# Patient Record
Sex: Female | Born: 1955
Health system: Southern US, Community
[De-identification: ages and names within clinical notes are randomized; demographics above are authoritative.]

## PROBLEM LIST (undated history)

## (undated) DIAGNOSIS — K760 Fatty (change of) liver, not elsewhere classified: Secondary | ICD-10-CM

## (undated) DIAGNOSIS — N1330 Unspecified hydronephrosis: Secondary | ICD-10-CM

## (undated) DIAGNOSIS — Z1379 Encounter for other screening for genetic and chromosomal anomalies: Secondary | ICD-10-CM

## (undated) DIAGNOSIS — K219 Gastro-esophageal reflux disease without esophagitis: Secondary | ICD-10-CM

## (undated) DIAGNOSIS — G4730 Sleep apnea, unspecified: Secondary | ICD-10-CM

## (undated) DIAGNOSIS — R011 Cardiac murmur, unspecified: Secondary | ICD-10-CM

## (undated) DIAGNOSIS — H3530 Unspecified macular degeneration: Secondary | ICD-10-CM

## (undated) DIAGNOSIS — D649 Anemia, unspecified: Secondary | ICD-10-CM

## (undated) DIAGNOSIS — F32A Depression, unspecified: Secondary | ICD-10-CM

## (undated) DIAGNOSIS — M1990 Unspecified osteoarthritis, unspecified site: Secondary | ICD-10-CM

## (undated) DIAGNOSIS — C5702 Malignant neoplasm of left fallopian tube: Secondary | ICD-10-CM

## (undated) DIAGNOSIS — Z8489 Family history of other specified conditions: Secondary | ICD-10-CM

## (undated) DIAGNOSIS — R519 Headache, unspecified: Secondary | ICD-10-CM

## (undated) DIAGNOSIS — R51 Headache: Secondary | ICD-10-CM

## (undated) DIAGNOSIS — Z803 Family history of malignant neoplasm of breast: Secondary | ICD-10-CM

## (undated) DIAGNOSIS — E7800 Pure hypercholesterolemia, unspecified: Secondary | ICD-10-CM

## (undated) DIAGNOSIS — F419 Anxiety disorder, unspecified: Secondary | ICD-10-CM

## (undated) DIAGNOSIS — I10 Essential (primary) hypertension: Secondary | ICD-10-CM

## (undated) DIAGNOSIS — J189 Pneumonia, unspecified organism: Secondary | ICD-10-CM

## (undated) DIAGNOSIS — K5790 Diverticulosis of intestine, part unspecified, without perforation or abscess without bleeding: Secondary | ICD-10-CM

## (undated) DIAGNOSIS — F329 Major depressive disorder, single episode, unspecified: Secondary | ICD-10-CM

## (undated) DIAGNOSIS — H3552 Pigmentary retinal dystrophy: Secondary | ICD-10-CM

## (undated) DIAGNOSIS — E039 Hypothyroidism, unspecified: Secondary | ICD-10-CM

## (undated) DIAGNOSIS — Z8619 Personal history of other infectious and parasitic diseases: Secondary | ICD-10-CM

## (undated) HISTORY — DX: Personal history of other infectious and parasitic diseases: Z86.19

## (undated) HISTORY — DX: Essential (primary) hypertension: I10

## (undated) HISTORY — DX: Hypothyroidism, unspecified: E03.9

## (undated) HISTORY — DX: Headache, unspecified: R51.9

## (undated) HISTORY — DX: Unspecified osteoarthritis, unspecified site: M19.90

## (undated) HISTORY — DX: Family history of malignant neoplasm of breast: Z80.3

## (undated) HISTORY — PX: ABDOMINAL HYSTERECTOMY: SUR658

## (undated) HISTORY — DX: Headache: R51

## (undated) HISTORY — DX: Encounter for other screening for genetic and chromosomal anomalies: Z13.79

## (undated) HISTORY — DX: Depression, unspecified: F32.A

## (undated) HISTORY — PX: COLONOSCOPY: SHX174

## (undated) HISTORY — DX: Malignant neoplasm of left fallopian tube: C57.02

## (undated) HISTORY — DX: Gastro-esophageal reflux disease without esophagitis: K21.9

## (undated) HISTORY — DX: Major depressive disorder, single episode, unspecified: F32.9

## (undated) HISTORY — DX: Pure hypercholesterolemia, unspecified: E78.00

---

## 1979-11-19 HISTORY — PX: APPENDECTOMY: SHX54

## 1983-11-19 HISTORY — PX: TUBAL LIGATION: SHX77

## 1998-11-18 HISTORY — PX: CARPAL TUNNEL RELEASE: SHX101

## 1999-03-20 ENCOUNTER — Ambulatory Visit (HOSPITAL_BASED_OUTPATIENT_CLINIC_OR_DEPARTMENT_OTHER): Admission: RE | Admit: 1999-03-20 | Discharge: 1999-03-20 | Payer: Self-pay | Admitting: Orthopedic Surgery

## 2004-11-27 ENCOUNTER — Ambulatory Visit: Payer: Self-pay | Admitting: Unknown Physician Specialty

## 2006-04-17 ENCOUNTER — Ambulatory Visit: Payer: Self-pay | Admitting: Unknown Physician Specialty

## 2006-11-18 HISTORY — PX: EYE SURGERY: SHX253

## 2007-04-20 ENCOUNTER — Ambulatory Visit: Payer: Self-pay | Admitting: Unknown Physician Specialty

## 2007-10-19 ENCOUNTER — Ambulatory Visit: Payer: Self-pay | Admitting: Gastroenterology

## 2008-04-25 ENCOUNTER — Ambulatory Visit: Payer: Self-pay | Admitting: Unknown Physician Specialty

## 2009-06-27 ENCOUNTER — Ambulatory Visit: Payer: Self-pay | Admitting: Unknown Physician Specialty

## 2010-12-18 ENCOUNTER — Ambulatory Visit: Payer: Self-pay | Admitting: Unknown Physician Specialty

## 2011-01-30 ENCOUNTER — Ambulatory Visit: Payer: Self-pay | Admitting: Unknown Physician Specialty

## 2011-03-01 ENCOUNTER — Ambulatory Visit: Payer: Self-pay | Admitting: Unknown Physician Specialty

## 2012-05-28 ENCOUNTER — Other Ambulatory Visit (HOSPITAL_COMMUNITY)
Admission: RE | Admit: 2012-05-28 | Discharge: 2012-05-28 | Disposition: A | Discharge status: Home / Self Care | Payer: BC Managed Care – PPO | Source: Ambulatory Visit | Attending: Internal Medicine | Admitting: Internal Medicine

## 2012-05-28 DIAGNOSIS — Z01419 Encounter for gynecological examination (general) (routine) without abnormal findings: Secondary | ICD-10-CM | POA: Insufficient documentation

## 2012-05-28 DIAGNOSIS — R87810 Cervical high risk human papillomavirus (HPV) DNA test positive: Secondary | ICD-10-CM | POA: Insufficient documentation

## 2012-06-18 ENCOUNTER — Ambulatory Visit: Payer: Self-pay

## 2013-05-24 ENCOUNTER — Ambulatory Visit: Payer: Self-pay

## 2013-05-24 LAB — RAPID STREP-A WITH REFLX: Micro Text Report: NEGATIVE

## 2013-09-07 ENCOUNTER — Ambulatory Visit (INDEPENDENT_AMBULATORY_CARE_PROVIDER_SITE_OTHER): Payer: BC Managed Care – PPO | Admitting: Internal Medicine

## 2013-09-07 ENCOUNTER — Ambulatory Visit (INDEPENDENT_AMBULATORY_CARE_PROVIDER_SITE_OTHER)
Admission: RE | Admit: 2013-09-07 | Discharge: 2013-09-07 | Disposition: A | Discharge status: Home / Self Care | Payer: BC Managed Care – PPO | Source: Ambulatory Visit | Attending: Internal Medicine | Admitting: Internal Medicine

## 2013-09-07 ENCOUNTER — Encounter (INDEPENDENT_AMBULATORY_CARE_PROVIDER_SITE_OTHER): Payer: Self-pay

## 2013-09-07 ENCOUNTER — Encounter: Payer: Self-pay | Admitting: Internal Medicine

## 2013-09-07 ENCOUNTER — Other Ambulatory Visit: Payer: Self-pay | Admitting: Internal Medicine

## 2013-09-07 VITALS — BP 110/70 | HR 76 | Temp 98.1°F | Ht 63.75 in | Wt 181.2 lb

## 2013-09-07 DIAGNOSIS — Z87898 Personal history of other specified conditions: Secondary | ICD-10-CM

## 2013-09-07 DIAGNOSIS — K219 Gastro-esophageal reflux disease without esophagitis: Secondary | ICD-10-CM

## 2013-09-07 DIAGNOSIS — M549 Dorsalgia, unspecified: Secondary | ICD-10-CM

## 2013-09-07 DIAGNOSIS — I10 Essential (primary) hypertension: Secondary | ICD-10-CM | POA: Insufficient documentation

## 2013-09-07 DIAGNOSIS — E039 Hypothyroidism, unspecified: Secondary | ICD-10-CM | POA: Insufficient documentation

## 2013-09-07 DIAGNOSIS — R0602 Shortness of breath: Secondary | ICD-10-CM | POA: Insufficient documentation

## 2013-09-07 DIAGNOSIS — F32A Depression, unspecified: Secondary | ICD-10-CM

## 2013-09-07 DIAGNOSIS — M129 Arthropathy, unspecified: Secondary | ICD-10-CM

## 2013-09-07 DIAGNOSIS — F3289 Other specified depressive episodes: Secondary | ICD-10-CM

## 2013-09-07 DIAGNOSIS — K5790 Diverticulosis of intestine, part unspecified, without perforation or abscess without bleeding: Secondary | ICD-10-CM

## 2013-09-07 DIAGNOSIS — K5730 Diverticulosis of large intestine without perforation or abscess without bleeding: Secondary | ICD-10-CM

## 2013-09-07 DIAGNOSIS — E7800 Pure hypercholesterolemia, unspecified: Secondary | ICD-10-CM | POA: Insufficient documentation

## 2013-09-07 DIAGNOSIS — Z8742 Personal history of other diseases of the female genital tract: Secondary | ICD-10-CM

## 2013-09-07 DIAGNOSIS — R51 Headache: Secondary | ICD-10-CM

## 2013-09-07 DIAGNOSIS — F329 Major depressive disorder, single episode, unspecified: Secondary | ICD-10-CM

## 2013-09-07 DIAGNOSIS — M1990 Unspecified osteoarthritis, unspecified site: Secondary | ICD-10-CM

## 2013-09-07 DIAGNOSIS — H3090 Unspecified chorioretinal inflammation, unspecified eye: Secondary | ICD-10-CM

## 2013-09-07 LAB — COMPREHENSIVE METABOLIC PANEL
ALT: 31 U/L (ref 0–35)
Albumin: 4.1 g/dL (ref 3.5–5.2)
Alkaline Phosphatase: 73 U/L (ref 39–117)
BUN: 15 mg/dL (ref 6–23)
Calcium: 9.3 mg/dL (ref 8.4–10.5)
GFR: 79.81 mL/min (ref >60.00)
Glucose, Bld: 95 mg/dL (ref 70–99)
Potassium: 4.2 mEq/L (ref 3.5–5.1)
Sodium: 138 mEq/L (ref 135–145)

## 2013-09-07 LAB — CBC WITH DIFFERENTIAL/PLATELET
Basophils Absolute: 0.1 10*3/uL (ref 0.0–0.1)
Basophils Relative: 0.8 % (ref 0.0–3.0)
Eosinophils Relative: 1.5 % (ref 0.0–5.0)
HCT: 39.6 % (ref 36.0–46.0)
MCHC: 33.6 g/dL (ref 30.0–36.0)
MCV: 90.4 fl (ref 78.0–100.0)
Monocytes Absolute: 0.6 10*3/uL (ref 0.1–1.0)
Neutrophils Relative %: 52.1 % (ref 43.0–77.0)
RBC: 4.38 Mil/uL (ref 3.87–5.11)
RDW: 14.3 % (ref 11.5–14.6)
WBC: 6.7 10*3/uL (ref 4.5–10.5)

## 2013-09-07 LAB — LIPID PANEL
Cholesterol: 232 mg/dL — ABNORMAL HIGH (ref 0–200)
HDL: 50.6 mg/dL (ref >39.00)
Total CHOL/HDL Ratio: 5
Triglycerides: 95 mg/dL (ref 0.0–149.0)
VLDL: 19 mg/dL (ref 0.0–40.0)

## 2013-09-07 LAB — LDL CHOLESTEROL, DIRECT: Direct LDL: 175.2 mg/dL

## 2013-09-08 ENCOUNTER — Encounter: Payer: Self-pay | Admitting: Internal Medicine

## 2013-09-08 DIAGNOSIS — M1990 Unspecified osteoarthritis, unspecified site: Secondary | ICD-10-CM | POA: Insufficient documentation

## 2013-09-08 DIAGNOSIS — K219 Gastro-esophageal reflux disease without esophagitis: Secondary | ICD-10-CM | POA: Insufficient documentation

## 2013-09-08 DIAGNOSIS — F320 Major depressive disorder, single episode, mild: Secondary | ICD-10-CM | POA: Insufficient documentation

## 2013-09-08 DIAGNOSIS — K5790 Diverticulosis of intestine, part unspecified, without perforation or abscess without bleeding: Secondary | ICD-10-CM | POA: Insufficient documentation

## 2013-09-08 DIAGNOSIS — H3090 Unspecified chorioretinal inflammation, unspecified eye: Secondary | ICD-10-CM | POA: Insufficient documentation

## 2013-09-08 DIAGNOSIS — R87619 Unspecified abnormal cytological findings in specimens from cervix uteri: Secondary | ICD-10-CM | POA: Insufficient documentation

## 2013-09-08 DIAGNOSIS — R51 Headache: Secondary | ICD-10-CM | POA: Insufficient documentation

## 2013-09-08 DIAGNOSIS — F32A Depression, unspecified: Secondary | ICD-10-CM | POA: Insufficient documentation

## 2013-09-08 DIAGNOSIS — R519 Headache, unspecified: Secondary | ICD-10-CM | POA: Insufficient documentation

## 2013-09-08 DIAGNOSIS — F329 Major depressive disorder, single episode, unspecified: Secondary | ICD-10-CM | POA: Insufficient documentation

## 2013-09-08 NOTE — Assessment & Plan Note (Signed)
Experienced some depression in taking care of her mother with alzheimers.  On wellbutrin and doing well.  Follow.   

## 2013-09-08 NOTE — Assessment & Plan Note (Signed)
Previously saw Dr Haskel Khan.  Schedule her for a physical including pap - next visit.

## 2013-09-08 NOTE — Progress Notes (Signed)
Subjective:    Patient ID: Anna [name], female    DOB: August 17, 1956, 57 y.o.   MRN: [id number]  HPI 57 year old female with past history of hypertension, hypercholesterolemia and hypothyroidism who comes in today to follow up on these issues as well as to establish care.  Former pt of Dr Francia Greaves and Dr Elby Showers.  She reports having issues with persistent, worsening low back pain.  Radiates into her hips.  No radiation down her leg.  Present for two years.  Worsening.  Bothers her more when she stands for any length of time.  She also has noticed some increased sob with exertion.  Occasional chest tightness associated with exertion as well.  Will feel weak.  Has to stop and rest.  No nausea or vomiting.  Acid reflux controlled on prilosec.  No abdominal pain or cramping.  Bowels stable.  States received notice due for colonoscopy.  Sees Dr Markham Jordan.  Headaches no longer an issue for her.  She is followed by [facility] for retinitis.  Doing well regarding her depression.  On wellbutrin.     Past Medical History  Diagnosis Date  . Arthritis   . History of chicken pox   . Diverticulitis     H/O  . Frequent headaches     H/O  . GERD (gastroesophageal reflux disease)   . Hypothyroidism   . Hypercholesterolemia   . Hypertension   . Depression     Outpatient Encounter Prescriptions as of 09/07/2013  Medication Sig Dispense Refill  . acyclovir (ZOVIRAX) 200 MG capsule Take by mouth 5 (five) times daily. As needed      . acyclovir cream (ZOVIRAX) 5 % Apply topically daily as needed.      . benazepril-hydrochlorthiazide (LOTENSIN HCT) 20-12.5 MG per tablet Take 1 tablet by mouth daily.      Marland Kitchen buPROPion (WELLBUTRIN XL) 300 MG 24 hr tablet Take 300 mg by mouth daily.      Marland Kitchen EPINEPHrine (EPI-PEN) 0.3 mg/0.3 mL SOAJ injection Inject 0.3 mg into the muscle as needed (for anaphylaxis).      Marland Kitchen levothyroxine (SYNTHROID, LEVOTHROID) 88 MCG tablet Take 88 mcg by mouth daily before  breakfast.      . omeprazole (PRILOSEC) 20 MG capsule Take 20 mg by mouth daily.       No facility-administered encounter medications on file as of 09/07/2013.    Review of Systems Patient denies any headaches now.  No lightheadedness or dizziness.  No sinus or allergy symptoms.  She does report some sob with exertion.  Occasional chest tightness as outlined.  No palpitations.  No increased cough or congestion.  No nausea or vomiting.  Acid reflux controlled with prilosec.  No abdominal pain or cramping.  No bowel change, such as diarrhea, constipation, BRBPR or melana.  No urine change.   Back pan as outlined.  No numbness or tingling.       Objective:   Physical Exam Filed Vitals:   09/07/13 0838  BP: 110/70  Pulse: 76  Temp: 98.1 F (36.7 C)   Blood pressure recheck:  53/67  57 year old female in no acute distress.   HEENT:  Nares- clear.  Oropharynx - without lesions. NECK:  Supple.  Nontender.  No audible bruit.  HEART:  Appears to be regular. LUNGS:  No crackles or wheezing audible.  Respirations even and unlabored.  RADIAL PULSE:  Equal bilaterally.  ABDOMEN:  Soft, nontender.  Bowel sounds present and normal.  No audible abdominal bruit.    EXTREMITIES:  No increased edema present.  DP pulses palpable and equal bilaterally.      MSK:  No pain with straight leg raise.  No pain to palpation - lower back.      Assessment & Plan:  HEALTH MAINTENANCE.  Get her back in soon for a physical.  Get her heart evaluated prior to colonoscopy.  Obtain records for review.  Information given to schedule a mammogram.    I spent 45 minutes with the patient and more than 50% of the time was spent in consultation regarding the above.

## 2013-09-08 NOTE — Assessment & Plan Note (Signed)
Previously had issues with headaches.  Related to her eyes.  Has retinitis.  Followed at [facility].  Stable.  No headaches now.    

## 2013-09-08 NOTE — Assessment & Plan Note (Signed)
On thyroid replacement.  Check tsh.  

## 2013-09-08 NOTE — Assessment & Plan Note (Signed)
Followed at [facility].  Stable.   

## 2013-09-08 NOTE — Assessment & Plan Note (Signed)
States she has a history of arthritis.  With increased and persistent back pain.  Pain worse if she has been standing for a long period of time.  Started two years ago.  No radiation of the pain down her legs.  Some radiation into her hips.  Given persistent and worsening pain, will check L-S spine xray.  Further w/up and treatment pending results.

## 2013-09-08 NOTE — Assessment & Plan Note (Signed)
On no medication.  Low cholesterol diet.  Check lipid panel.

## 2013-09-08 NOTE — Assessment & Plan Note (Signed)
Has noticed sob and dyspnea on exertion.  States if she walks up stairs or extended walking, she will get sob and have to stop and rest.  Some weakness associated.  Occasionally will notice some chest tightness as well.  EKG obtained and revealed SR with flattening of T waves in III.  No acute ischemic changes.  Will obtain stress echo to further evaluate.  Continue risk factor modification.

## 2013-09-08 NOTE — Assessment & Plan Note (Signed)
Blood pressure controlled on current med regimen.  Check metabolic panel.  Follow.   

## 2013-09-08 NOTE — Assessment & Plan Note (Signed)
Has had diverticulitis.  Last flare a few years ago.  Bowels stable.  Obtain record for review.  Check on colonoscopy.

## 2013-09-08 NOTE — Assessment & Plan Note (Signed)
On omeprazole.  Controlled.   

## 2013-09-09 ENCOUNTER — Other Ambulatory Visit: Payer: Self-pay | Admitting: *Deleted

## 2013-09-09 ENCOUNTER — Other Ambulatory Visit: Payer: Self-pay | Admitting: Internal Medicine

## 2013-09-09 DIAGNOSIS — E7800 Pure hypercholesterolemia, unspecified: Secondary | ICD-10-CM

## 2013-09-09 MED ORDER — ATORVASTATIN CALCIUM 10 MG PO TABS: 10.0000 mg | ORAL_TABLET | Freq: Every day | ORAL | Status: DC

## 2013-09-09 NOTE — Progress Notes (Signed)
Order placed for f/u liver panel.  

## 2013-09-23 ENCOUNTER — Other Ambulatory Visit: Payer: Self-pay

## 2013-09-23 ENCOUNTER — Ambulatory Visit: Payer: Self-pay | Admitting: Internal Medicine

## 2013-09-27 ENCOUNTER — Ambulatory Visit (INDEPENDENT_AMBULATORY_CARE_PROVIDER_SITE_OTHER): Payer: BC Managed Care – PPO | Admitting: Internal Medicine

## 2013-09-27 ENCOUNTER — Encounter: Payer: Self-pay | Admitting: Internal Medicine

## 2013-09-27 ENCOUNTER — Telehealth: Payer: Self-pay | Admitting: Emergency Medicine

## 2013-09-27 VITALS — BP 102/70 | HR 97 | Temp 98.2°F | Resp 12 | Wt 181.5 lb

## 2013-09-27 DIAGNOSIS — R21 Rash and other nonspecific skin eruption: Secondary | ICD-10-CM

## 2013-09-27 NOTE — Telephone Encounter (Signed)
See if pt can come in now and we will work her in between pts - will fast track for this problem.

## 2013-09-27 NOTE — Telephone Encounter (Signed)
Appointment made

## 2013-09-27 NOTE — Telephone Encounter (Signed)
Please advise 

## 2013-09-27 NOTE — Telephone Encounter (Signed)
Patient calling in stating she has a rash. It is spreading, everywhere. She is itchy. Raquel nor Dr. Lorin Picket has any openings. Pt would like to be seen. Please call her work number listed.

## 2013-09-27 NOTE — Progress Notes (Signed)
Pre visit review using our clinic review tool, if applicable. No additional management support is needed unless otherwise documented below in the visit note. 

## 2013-09-27 NOTE — Telephone Encounter (Signed)
Pt coming in now & aware that it will be a wait involved. Anna [name], please but this patient on Dr. Lorin Picket schedule to evaluate rash/hives

## 2013-09-28 ENCOUNTER — Encounter: Payer: Self-pay | Admitting: Internal Medicine

## 2013-09-28 DIAGNOSIS — R21 Rash and other nonspecific skin eruption: Secondary | ICD-10-CM | POA: Insufficient documentation

## 2013-09-28 NOTE — Assessment & Plan Note (Signed)
Rash as outlined.  Itching, but no other symptoms.  No fever.  Does not feel bad.  No new exposures.  Unclear etiology.  Zyrtec daily.  Hold prednisone.  Have dermatology evaluate.  Pt comfortable with this plan.  Call if any change or worsening symptoms

## 2013-09-28 NOTE — Progress Notes (Signed)
  Subjective:    Patient ID: Anna [name], female    DOB: November 19, 1955, 57 y.o.   MRN: [id number]  Rash  57 year old female with past history of hypertension, hypercholesterolemia and hypothyroidism who comes in today as a work in with concerns regarding a persistent rash.  States started one week ago.  Started behind her right knee.  Then noticed rash - right buttock.  Has continued to spread.  Now on the back of her neck, lower back, right and left arm and bilateral breasts.  Also located on both feet.  No rash - hands or face.  Itches.  No new contacts or exposures.  No fever.  No sore throat or congestion.  Feels fine.     Past Medical History  Diagnosis Date  . Arthritis   . History of chicken pox   . Diverticulitis     H/O  . Frequent headaches     H/O  . GERD (gastroesophageal reflux disease)   . Hypothyroidism   . Hypercholesterolemia   . Hypertension   . Depression     Outpatient Encounter Prescriptions as of 09/27/2013  Medication Sig  . acyclovir (ZOVIRAX) 200 MG capsule Take by mouth 5 (five) times daily. As needed  . acyclovir cream (ZOVIRAX) 5 % Apply topically daily as needed.  Marland Kitchen atorvastatin (LIPITOR) 10 MG tablet Take 1 tablet (10 mg total) by mouth daily.  . benazepril-hydrochlorthiazide (LOTENSIN HCT) 20-12.5 MG per tablet Take 1 tablet by mouth daily.  Marland Kitchen buPROPion (WELLBUTRIN XL) 300 MG 24 hr tablet Take 300 mg by mouth daily.  Marland Kitchen EPINEPHrine (EPI-PEN) 0.3 mg/0.3 mL SOAJ injection Inject 0.3 mg into the muscle as needed (for anaphylaxis).  Marland Kitchen levothyroxine (SYNTHROID, LEVOTHROID) 88 MCG tablet Take 88 mcg by mouth daily before breakfast.  . omeprazole (PRILOSEC) 20 MG capsule Take 20 mg by mouth daily.    Review of Systems  Skin: Positive for rash.  Patient denies any headaches.   No lightheadedness or dizziness.  No sinus or allergy symptoms.  Breathing stable.  No palpitations.  No increased cough or congestion.  No nausea or vomiting.  No abdominal pain or  cramping.  No bowel change, such as diarrhea.  Feels fine.  Has the rash.  Increased itching.  Took benadryl.       Objective:   Physical Exam  Filed Vitals:   09/27/13 1130  BP: 102/70  Pulse: 97  Temp: 98.2 F (36.8 C)  Resp: 67   57 year old female in no acute distress.   HEENT:  Nares- clear.  Oropharynx - without lesions. NECK:  Supple.  Nontender.   HEART:  Appears to be regular. LUNGS:  No crackles or wheezing audible.  Respirations even and unlabored.  RADIAL PULSE:  Equal bilaterally.  SKIN:  Erythematous based lesions over the right and left arm, bilateral feel, lower back and lower abdomen and neck.  Some appear to be vesicular.  Non tender.        Assessment & Plan:

## 2013-09-30 ENCOUNTER — Other Ambulatory Visit (INDEPENDENT_AMBULATORY_CARE_PROVIDER_SITE_OTHER): Payer: BC Managed Care – PPO

## 2013-09-30 DIAGNOSIS — R0602 Shortness of breath: Secondary | ICD-10-CM

## 2013-09-30 DIAGNOSIS — R079 Chest pain, unspecified: Secondary | ICD-10-CM

## 2013-10-04 ENCOUNTER — Encounter: Payer: Self-pay | Admitting: Internal Medicine

## 2013-10-06 NOTE — Telephone Encounter (Signed)
Mailed unread message to pt  

## 2013-10-08 ENCOUNTER — Encounter: Payer: Self-pay | Admitting: Internal Medicine

## 2013-10-11 ENCOUNTER — Telehealth: Payer: Self-pay | Admitting: Internal Medicine

## 2013-10-11 ENCOUNTER — Other Ambulatory Visit: Payer: Self-pay | Admitting: *Deleted

## 2013-10-11 MED ORDER — ATORVASTATIN CALCIUM 10 MG PO TABS: 10.0000 mg | ORAL_TABLET | Freq: Every day | ORAL | Status: DC

## 2013-10-11 NOTE — Telephone Encounter (Signed)
Sent Rx electronically

## 2013-10-11 NOTE — Telephone Encounter (Signed)
The patient is completely out of this medication   atorvastatin (LIPITOR) 10 MG tablet

## 2013-10-21 ENCOUNTER — Other Ambulatory Visit (INDEPENDENT_AMBULATORY_CARE_PROVIDER_SITE_OTHER): Payer: BC Managed Care – PPO

## 2013-10-21 DIAGNOSIS — E7800 Pure hypercholesterolemia, unspecified: Secondary | ICD-10-CM

## 2013-10-21 LAB — HEPATIC FUNCTION PANEL
AST: 21 U/L (ref 0–37)
Alkaline Phosphatase: 72 U/L (ref 39–117)
Bilirubin, Direct: 0 mg/dL (ref 0.0–0.3)
Total Bilirubin: 0.6 mg/dL (ref 0.3–1.2)

## 2013-10-22 ENCOUNTER — Encounter: Payer: Self-pay | Admitting: Internal Medicine

## 2013-10-25 NOTE — Telephone Encounter (Signed)
Mailed unread message to pt  

## 2013-11-22 ENCOUNTER — Encounter: Payer: BC Managed Care – PPO | Admitting: Internal Medicine

## 2013-11-22 ENCOUNTER — Other Ambulatory Visit: Payer: Self-pay | Admitting: *Deleted

## 2013-11-22 MED ORDER — ATORVASTATIN CALCIUM 10 MG PO TABS: 10.0000 mg | ORAL_TABLET | Freq: Every day | ORAL | Status: DC

## 2013-12-02 ENCOUNTER — Telehealth: Payer: Self-pay | Admitting: Internal Medicine

## 2013-12-02 NOTE — Telephone Encounter (Signed)
Pt appt 2/24 being rs due to provider schedule change.  [facility] 90 day supplies needed.  Pt needs scripts:  Omeprazole, benazepril, wellbutrin XL, levothyroxine.  Only has about a week left of her meds.

## 2013-12-03 ENCOUNTER — Other Ambulatory Visit: Payer: Self-pay | Admitting: *Deleted

## 2013-12-03 MED ORDER — BUPROPION HCL ER (XL) 300 MG PO TB24: 300.0000 mg | ORAL_TABLET | Freq: Every day | ORAL | Status: DC

## 2013-12-03 MED ORDER — OMEPRAZOLE 20 MG PO CPDR: 20.0000 mg | DELAYED_RELEASE_CAPSULE | Freq: Every day | ORAL | Status: DC

## 2013-12-03 MED ORDER — LEVOTHYROXINE SODIUM 88 MCG PO TABS
88.0000 ug | ORAL_TABLET | Freq: Every day | ORAL | Status: DC
Start: 2013-12-03 — End: 2014-02-14

## 2013-12-03 MED ORDER — BENAZEPRIL-HYDROCHLOROTHIAZIDE 20-12.5 MG PO TABS: 1.0000 | ORAL_TABLET | Freq: Every day | ORAL | Status: DC

## 2013-12-03 NOTE — Telephone Encounter (Signed)
Medications sent to [facility]

## 2013-12-03 NOTE — Telephone Encounter (Signed)
[facility] 90 day supplies needed. Pt needs scripts: Omeprazole, benazepril, wellbutrin XL, levothyroxine. Only has about a week left of her meds.

## 2013-12-31 ENCOUNTER — Encounter: Payer: Self-pay | Admitting: Internal Medicine

## 2013-12-31 ENCOUNTER — Ambulatory Visit (INDEPENDENT_AMBULATORY_CARE_PROVIDER_SITE_OTHER): Payer: BC Managed Care – PPO | Admitting: Internal Medicine

## 2013-12-31 VITALS — BP 120/80 | HR 90 | Temp 98.5°F | Ht 63.75 in | Wt 186.5 lb

## 2013-12-31 DIAGNOSIS — M542 Cervicalgia: Secondary | ICD-10-CM

## 2013-12-31 DIAGNOSIS — G4733 Obstructive sleep apnea (adult) (pediatric): Secondary | ICD-10-CM

## 2013-12-31 DIAGNOSIS — J329 Chronic sinusitis, unspecified: Secondary | ICD-10-CM

## 2013-12-31 DIAGNOSIS — R0683 Snoring: Secondary | ICD-10-CM

## 2013-12-31 DIAGNOSIS — R5383 Other fatigue: Secondary | ICD-10-CM

## 2013-12-31 DIAGNOSIS — I10 Essential (primary) hypertension: Secondary | ICD-10-CM

## 2013-12-31 DIAGNOSIS — R0989 Other specified symptoms and signs involving the circulatory and respiratory systems: Secondary | ICD-10-CM

## 2013-12-31 DIAGNOSIS — R5381 Other malaise: Secondary | ICD-10-CM

## 2013-12-31 DIAGNOSIS — R0609 Other forms of dyspnea: Secondary | ICD-10-CM

## 2013-12-31 MED ORDER — FLUTICASONE PROPIONATE 50 MCG/ACT NA SUSP: 2.0000 | Freq: Every day | NASAL | Status: DC

## 2013-12-31 MED ORDER — CEFDINIR 300 MG PO CAPS: 300.0000 mg | ORAL_CAPSULE | Freq: Two times a day (BID) | ORAL | Status: DC

## 2013-12-31 MED ORDER — ETODOLAC 400 MG PO TABS: 400.0000 mg | ORAL_TABLET | Freq: Two times a day (BID) | ORAL | Status: DC | PRN

## 2013-12-31 NOTE — Progress Notes (Signed)
Pre-visit discussion using our clinic review tool. No additional management support is needed unless otherwise documented below in the visit note.  

## 2013-12-31 NOTE — Patient Instructions (Signed)
Saline nasal spray - flush nose at least 2-3x/day.  Flonase nasal spray - 2 sprays each nostril one time per day- do this in the evening.   

## 2014-01-04 ENCOUNTER — Encounter: Payer: Self-pay | Admitting: Internal Medicine

## 2014-01-04 DIAGNOSIS — M542 Cervicalgia: Secondary | ICD-10-CM | POA: Insufficient documentation

## 2014-01-04 DIAGNOSIS — G4733 Obstructive sleep apnea (adult) (pediatric): Secondary | ICD-10-CM | POA: Insufficient documentation

## 2014-01-04 NOTE — Assessment & Plan Note (Signed)
Blood pressure controlled on current med regimen.  Follow metabolic panel.

## 2014-01-04 NOTE — Assessment & Plan Note (Signed)
Treat with omnicef as directed.  flonase and saline nasal spray as directed.  Robitussin as directed.  Follow.  Notify me if symptoms worsen or do not resolve.

## 2014-01-04 NOTE — Assessment & Plan Note (Signed)
Symptoms appear to be consistent with sleep apnea.  Reports witness apneic episodes and increased snoring.  Also with daytime somnolence and fatigue.  Does not feel rested when she wakes.  Schedule a split night sleep study.

## 2014-01-04 NOTE — Assessment & Plan Note (Addendum)
Neck pain and exam as outlined.  Increased pain with rotation of her head.  Will hold on a muscle relaxor until can get the sleep apnea issues sorted through.  Gentle stretches.  Lodine as directed.  Discussed possible side effects of medication.

## 2014-01-04 NOTE — Progress Notes (Signed)
Subjective:    Patient ID: Anna [name], female    DOB: 01-31-1956, 58 y.o.   MRN: [id number]  URI  Associated symptoms include a plugged ear sensation.  Ear Fullness   58 year old female with past history of hypertension, hypercholesterolemia and hypothyroidism who comes in today as a work in with concerns regarding increased sinus pressure and neck pain.  States posterior neck pain started four days ago.  She thought she had "slept wrong".  She has continued to have neck pain.  Increased pain with looking from left to right.  Describes a pulling sensation.  She also reports increased sinus pressure and increased drainage.  Irritated throat.  Some right ear fullness.  No chest congestion.  No vomiting or diarrhea.  Started taking Dayquil.  Also taking Ibuprofen.     Past Medical History  Diagnosis Date  . Arthritis   . History of chicken pox   . Diverticulitis     H/O  . Frequent headaches     H/O  . GERD (gastroesophageal reflux disease)   . Hypothyroidism   . Hypercholesterolemia   . Hypertension   . Depression     Outpatient Encounter Prescriptions as of 12/31/2013  Medication Sig  . acyclovir (ZOVIRAX) 200 MG capsule Take by mouth 5 (five) times daily. As needed  . acyclovir cream (ZOVIRAX) 5 % Apply topically daily as needed.  Marland Kitchen atorvastatin (LIPITOR) 10 MG tablet Take 1 tablet (10 mg total) by mouth daily.  . benazepril-hydrochlorthiazide (LOTENSIN HCT) 20-12.5 MG per tablet Take 1 tablet by mouth daily.  Marland Kitchen buPROPion (WELLBUTRIN XL) 300 MG 24 hr tablet Take 1 tablet (300 mg total) by mouth daily.  Marland Kitchen EPINEPHrine (EPI-PEN) 0.3 mg/0.3 mL SOAJ injection Inject 0.3 mg into the muscle as needed (for anaphylaxis).  Marland Kitchen levothyroxine (SYNTHROID, LEVOTHROID) 88 MCG tablet Take 1 tablet (88 mcg total) by mouth daily before breakfast.  . omeprazole (PRILOSEC) 20 MG capsule Take 1 capsule (20 mg total) by mouth daily.  . cefdinir (OMNICEF) 300 MG capsule Take 1 capsule (300 mg total) by  mouth 2 (two) times daily.  Marland Kitchen etodolac (LODINE) 400 MG tablet Take 1 tablet (400 mg total) by mouth 2 (two) times daily as needed.  . fluticasone (FLONASE) 50 MCG/ACT nasal spray Place 2 sprays into both nostrils daily.    Review of Systems Patient denies any headaches now. Does report sinus pressure.  Minimal light headedness with these symptoms started. No significant dizziness.  Increased drainage and irritated throat.  Right ear fullness.  Symptoms feel c/w her previous sinus infections.  No increased cough or congestion.   No nausea or vomiting.  Acid reflux controlled with prilosec.  Increased neck pain/discomfort with rotation of her head.  Describes a pulling sensation.  Discussed the possiblity of sleep apnea.  Does report that her husband has noticed spells of not breathing when sleeping.  Reports snoring. Increased daytime fatigue.  Wakes up - not feeling rested.  Daytime somnolence.        Objective:   Physical Exam  Filed Vitals:   12/31/13 1606  BP: 120/80  Pulse: 90  Temp: 98.5 F (57.9 C)   58 year old female in no acute distress.   HEENT:  Nares- slightly erythematous turbinates.  Oropharynx - without lesions.  TMs - no erythema.  Question of some fluid behind right ear.  Minimal tenderness over the maxillary sinus.   NECK:  Increased pulling sensation and discomfort with rotation of her head to  right and left.  Increased tenderness to palpation over the right lateral neck.   HEART:  Appears to be regular. LUNGS:  No crackles or wheezing audible.  Respirations even and unlabored.      Assessment & Plan:  HEALTH MAINTENANCE.  Get her back in soon for a physical.  Mammogram 09/23/13 - Birads I.    I spent 25 minutes with the patient and more than 50% of the time was spent in consultation regarding the above.

## 2014-01-10 ENCOUNTER — Encounter: Payer: BC Managed Care – PPO | Admitting: Internal Medicine

## 2014-01-28 ENCOUNTER — Encounter: Payer: Self-pay | Admitting: Internal Medicine

## 2014-01-28 ENCOUNTER — Ambulatory Visit (INDEPENDENT_AMBULATORY_CARE_PROVIDER_SITE_OTHER): Payer: BC Managed Care – PPO | Admitting: Internal Medicine

## 2014-01-28 ENCOUNTER — Other Ambulatory Visit (HOSPITAL_COMMUNITY)
Admission: RE | Admit: 2014-01-28 | Discharge: 2014-01-28 | Disposition: A | Discharge status: Home / Self Care | Payer: BC Managed Care – PPO | Source: Ambulatory Visit | Attending: Internal Medicine | Admitting: Internal Medicine

## 2014-01-28 VITALS — BP 110/80 | HR 89 | Temp 98.3°F | Ht 64.25 in | Wt 186.5 lb

## 2014-01-28 DIAGNOSIS — Z01419 Encounter for gynecological examination (general) (routine) without abnormal findings: Secondary | ICD-10-CM | POA: Insufficient documentation

## 2014-01-28 DIAGNOSIS — M129 Arthropathy, unspecified: Secondary | ICD-10-CM

## 2014-01-28 DIAGNOSIS — Z1151 Encounter for screening for human papillomavirus (HPV): Secondary | ICD-10-CM | POA: Insufficient documentation

## 2014-01-28 DIAGNOSIS — E7800 Pure hypercholesterolemia, unspecified: Secondary | ICD-10-CM

## 2014-01-28 DIAGNOSIS — M1990 Unspecified osteoarthritis, unspecified site: Secondary | ICD-10-CM

## 2014-01-28 DIAGNOSIS — H3090 Unspecified chorioretinal inflammation, unspecified eye: Secondary | ICD-10-CM

## 2014-01-28 DIAGNOSIS — K5730 Diverticulosis of large intestine without perforation or abscess without bleeding: Secondary | ICD-10-CM

## 2014-01-28 DIAGNOSIS — G4733 Obstructive sleep apnea (adult) (pediatric): Secondary | ICD-10-CM

## 2014-01-28 DIAGNOSIS — Z1211 Encounter for screening for malignant neoplasm of colon: Secondary | ICD-10-CM

## 2014-01-28 DIAGNOSIS — F329 Major depressive disorder, single episode, unspecified: Secondary | ICD-10-CM

## 2014-01-28 DIAGNOSIS — R0602 Shortness of breath: Secondary | ICD-10-CM

## 2014-01-28 DIAGNOSIS — Z124 Encounter for screening for malignant neoplasm of cervix: Secondary | ICD-10-CM

## 2014-01-28 DIAGNOSIS — K219 Gastro-esophageal reflux disease without esophagitis: Secondary | ICD-10-CM

## 2014-01-28 DIAGNOSIS — E039 Hypothyroidism, unspecified: Secondary | ICD-10-CM

## 2014-01-28 DIAGNOSIS — F3289 Other specified depressive episodes: Secondary | ICD-10-CM

## 2014-01-28 DIAGNOSIS — F32A Depression, unspecified: Secondary | ICD-10-CM

## 2014-01-28 DIAGNOSIS — K5790 Diverticulosis of intestine, part unspecified, without perforation or abscess without bleeding: Secondary | ICD-10-CM

## 2014-01-28 DIAGNOSIS — R51 Headache: Secondary | ICD-10-CM

## 2014-01-28 DIAGNOSIS — I10 Essential (primary) hypertension: Secondary | ICD-10-CM

## 2014-01-28 MED ORDER — EPINEPHRINE 0.3 MG/0.3ML IJ SOAJ: 0.3000 mg | INTRAMUSCULAR | Status: DC | PRN

## 2014-01-28 NOTE — Progress Notes (Signed)
Pre-visit discussion using our clinic review tool. No additional management support is needed unless otherwise documented below in the visit note.  

## 2014-01-28 NOTE — Progress Notes (Signed)
Subjective:    Patient ID: Anna [name], female    DOB: 1956-02-23, 58 y.o.   MRN: [id number]  HPI 58 year old female with past history of hypertension, hypercholesterolemia and hypothyroidism who comes in today to follow up on these issues as well as for a complete physical exam.  She had reports\ed having issues with persistent, worsening low back pain.  Radiates into her hips.  No radiation down her leg.  Present for two years.  Better now.  No nausea or vomiting.  Acid reflux controlled on prilosec.  No abdominal pain or cramping.  Bowels stable.  States received notice due for colonoscopy.  Sees Dr Tiffany Kocher.  Headaches no longer an issue for her.  She is followed by [facility] for retinitis.  Doing well regarding her depression.  On wellbutrin.     Past Medical History  Diagnosis Date  . Arthritis   . History of chicken pox   . Diverticulitis     H/O  . Frequent headaches     H/O  . GERD (gastroesophageal reflux disease)   . Hypothyroidism   . Hypercholesterolemia   . Hypertension   . Depression     Outpatient Encounter Prescriptions as of 01/28/2014  Medication Sig  . acyclovir (ZOVIRAX) 200 MG capsule Take by mouth 5 (five) times daily. As needed  . acyclovir cream (ZOVIRAX) 5 % Apply topically daily as needed.  Marland Kitchen atorvastatin (LIPITOR) 10 MG tablet Take 1 tablet (10 mg total) by mouth daily.  . benazepril-hydrochlorthiazide (LOTENSIN HCT) 20-12.5 MG per tablet Take 1 tablet by mouth daily.  Marland Kitchen buPROPion (WELLBUTRIN XL) 300 MG 24 hr tablet Take 1 tablet (300 mg total) by mouth daily.  Marland Kitchen EPINEPHrine (EPI-PEN) 0.3 mg/0.3 mL SOAJ injection Inject 0.3 mg into the muscle as needed (for anaphylaxis).  Marland Kitchen etodolac (LODINE) 400 MG tablet Take 1 tablet (400 mg total) by mouth 2 (two) times daily as needed.  . fluticasone (FLONASE) 50 MCG/ACT nasal spray Place 2 sprays into both nostrils daily.  Marland Kitchen levothyroxine (SYNTHROID, LEVOTHROID) 88 MCG tablet Take 1 tablet (88 mcg total) by  mouth daily before breakfast.  . omeprazole (PRILOSEC) 20 MG capsule Take 1 capsule (20 mg total) by mouth daily.  . [DISCONTINUED] cefdinir (OMNICEF) 300 MG capsule Take 1 capsule (300 mg total) by mouth 2 (two) times daily.    Review of Systems Patient denies any headaches now.  No lightheadedness or dizziness.  No sinus or allergy symptoms.  No sob reported today.  No chest pain or tightness.  No palpitations.  No increased cough or congestion.  No nausea or vomiting.  Acid reflux controlled with prilosec.  No abdominal pain or cramping.  No bowel change, such as diarrhea, constipation, BRBPR or melana.  No urine change.   Back pain not reported as a significant issue on this visit.       Objective:   Physical Exam  Filed Vitals:   01/28/14 1506  BP: 110/80  Pulse: 89  Temp: 98.3 F (36.8 C)   Blood pressure recheck:  79/38  58 year old female in no acute distress.   HEENT:  Nares- clear.  Oropharynx - without lesions. NECK:  Supple.  Nontender.  No audible bruit.  HEART:  Appears to be regular. LUNGS:  No crackles or wheezing audible.  Respirations even and unlabored.  RADIAL PULSE:  Equal bilaterally.    BREASTS:  No nipple discharge or nipple retraction present.  Could not appreciate any distinct nodules  or axillary adenopathy.  ABDOMEN:  Soft, nontender.  Bowel sounds present and normal.  No audible abdominal bruit.  GU:  Normal external genitalia.  Vaginal vault without lesions.  Cervix identified.  Pap performed. Could not appreciate any adnexal masses or tenderness.   RECTAL:  Heme negative.   EXTREMITIES:  No increased edema present.  DP pulses palpable and equal bilaterally.          Assessment & Plan:  HEALTH MAINTENANCE.  Physical today.  Pap today.  Mammogram 09/23/13 - Birads I.  Refer to Dr Tiffany Kocher for colonoscopy.   I spent 25 minutes with the patient and more than 50% of the time was spent in consultation regarding the above.

## 2014-01-30 ENCOUNTER — Encounter: Payer: Self-pay | Admitting: Internal Medicine

## 2014-01-30 NOTE — Assessment & Plan Note (Signed)
Previously had issues with headaches.  Related to her eyes.  Has retinitis.  Followed at [facility].  Stable.  No headaches now.    

## 2014-01-30 NOTE — Assessment & Plan Note (Signed)
Experienced some depression in taking care of her mother with alzheimers.  On wellbutrin and doing well.  Follow.   

## 2014-01-30 NOTE — Assessment & Plan Note (Signed)
Symptoms appear to be consistent with sleep apnea.  Reports witness apneic episodes and increased snoring.  Also with daytime somnolence and fatigue.  Does not feel rested when she wakes.  She has been scheduled for a split night sleep study.  Had to reschedule.  Plans to get this soon.

## 2014-01-30 NOTE — Assessment & Plan Note (Signed)
On no medication.  Low cholesterol diet.  Follow lipid panel.   

## 2014-01-30 NOTE — Assessment & Plan Note (Signed)
Followed at [facility].  Stable.   

## 2014-01-30 NOTE — Assessment & Plan Note (Signed)
Previous back pain.  Xray with arthritis changes.  Physical therapy.  Back better.    

## 2014-01-30 NOTE — Assessment & Plan Note (Signed)
Resolved now.  Recent stress echo negative for ischemia.  Follow.

## 2014-01-30 NOTE — Assessment & Plan Note (Signed)
Has had diverticulitis.  Last flare a few years ago.  Bowels stable.  Due colonoscopy.  Referral back to Dr Tonna Boehringer.

## 2014-01-30 NOTE — Assessment & Plan Note (Signed)
On omeprazole.  Controlled.   

## 2014-01-30 NOTE — Assessment & Plan Note (Signed)
Previously saw Dr Vernie Ammons.  Repeat pap today.

## 2014-01-30 NOTE — Assessment & Plan Note (Signed)
On thyroid replacement.  Follow tsh.  

## 2014-01-30 NOTE — Assessment & Plan Note (Signed)
Blood pressure controlled on current med regimen.  Check metabolic panel.  Follow.   

## 2014-02-01 ENCOUNTER — Encounter: Payer: Self-pay | Admitting: Internal Medicine

## 2014-02-03 NOTE — Telephone Encounter (Signed)
Mailed unread message to pt  

## 2014-02-14 ENCOUNTER — Other Ambulatory Visit: Payer: BC Managed Care – PPO

## 2014-02-14 ENCOUNTER — Other Ambulatory Visit: Payer: Self-pay | Admitting: Internal Medicine

## 2014-03-10 ENCOUNTER — Ambulatory Visit: Payer: Self-pay | Admitting: Internal Medicine

## 2014-03-25 ENCOUNTER — Ambulatory Visit: Payer: Self-pay | Admitting: Gastroenterology

## 2014-03-25 LAB — HM COLONOSCOPY: HM COLON: NORMAL

## 2014-03-31 ENCOUNTER — Encounter: Payer: Self-pay | Admitting: Internal Medicine

## 2014-04-12 ENCOUNTER — Encounter: Payer: Self-pay | Admitting: Internal Medicine

## 2014-04-18 ENCOUNTER — Other Ambulatory Visit: Payer: Self-pay | Admitting: Internal Medicine

## 2014-04-29 ENCOUNTER — Other Ambulatory Visit: Payer: Self-pay | Admitting: Internal Medicine

## 2014-05-16 ENCOUNTER — Other Ambulatory Visit: Payer: Self-pay

## 2014-06-20 ENCOUNTER — Encounter: Payer: Self-pay | Admitting: Internal Medicine

## 2014-06-20 DIAGNOSIS — Z83719 Family history of colon polyps, unspecified: Secondary | ICD-10-CM

## 2014-06-20 DIAGNOSIS — Z8371 Family history of colonic polyps: Secondary | ICD-10-CM | POA: Insufficient documentation

## 2014-07-12 ENCOUNTER — Other Ambulatory Visit: Payer: Self-pay | Admitting: Internal Medicine

## 2014-08-04 ENCOUNTER — Ambulatory Visit (INDEPENDENT_AMBULATORY_CARE_PROVIDER_SITE_OTHER): Payer: BC Managed Care – PPO | Admitting: Internal Medicine

## 2014-08-04 ENCOUNTER — Encounter: Payer: Self-pay | Admitting: Internal Medicine

## 2014-08-04 VITALS — BP 110/70 | HR 80 | Temp 98.3°F | Ht 64.25 in | Wt 186.5 lb

## 2014-08-04 DIAGNOSIS — E7800 Pure hypercholesterolemia, unspecified: Secondary | ICD-10-CM

## 2014-08-04 DIAGNOSIS — Z8371 Family history of colonic polyps: Secondary | ICD-10-CM

## 2014-08-04 DIAGNOSIS — M1990 Unspecified osteoarthritis, unspecified site: Secondary | ICD-10-CM

## 2014-08-04 DIAGNOSIS — Z23 Encounter for immunization: Secondary | ICD-10-CM

## 2014-08-04 DIAGNOSIS — B351 Tinea unguium: Secondary | ICD-10-CM

## 2014-08-04 DIAGNOSIS — E039 Hypothyroidism, unspecified: Secondary | ICD-10-CM

## 2014-08-04 DIAGNOSIS — F32A Depression, unspecified: Secondary | ICD-10-CM

## 2014-08-04 DIAGNOSIS — Z1239 Encounter for other screening for malignant neoplasm of breast: Secondary | ICD-10-CM

## 2014-08-04 DIAGNOSIS — F3289 Other specified depressive episodes: Secondary | ICD-10-CM

## 2014-08-04 DIAGNOSIS — M129 Arthropathy, unspecified: Secondary | ICD-10-CM

## 2014-08-04 DIAGNOSIS — G4733 Obstructive sleep apnea (adult) (pediatric): Secondary | ICD-10-CM

## 2014-08-04 DIAGNOSIS — Z83719 Family history of colon polyps, unspecified: Secondary | ICD-10-CM

## 2014-08-04 DIAGNOSIS — K219 Gastro-esophageal reflux disease without esophagitis: Secondary | ICD-10-CM

## 2014-08-04 DIAGNOSIS — I10 Essential (primary) hypertension: Secondary | ICD-10-CM

## 2014-08-04 DIAGNOSIS — H3090 Unspecified chorioretinal inflammation, unspecified eye: Secondary | ICD-10-CM

## 2014-08-04 DIAGNOSIS — K5730 Diverticulosis of large intestine without perforation or abscess without bleeding: Secondary | ICD-10-CM

## 2014-08-04 DIAGNOSIS — F329 Major depressive disorder, single episode, unspecified: Secondary | ICD-10-CM

## 2014-08-04 DIAGNOSIS — R51 Headache: Secondary | ICD-10-CM

## 2014-08-04 LAB — BASIC METABOLIC PANEL
BUN: 12 mg/dL (ref 6–23)
CO2: 26 meq/L (ref 19–32)
Calcium: 8.7 mg/dL (ref 8.4–10.5)
Chloride: 103 mEq/L (ref 96–112)
Creatinine, Ser: 0.9 mg/dL (ref 0.4–1.2)
GFR: 65.9 mL/min (ref >60.00)
Glucose, Bld: 93 mg/dL (ref 70–99)
POTASSIUM: 4.1 meq/L (ref 3.5–5.1)
SODIUM: 139 meq/L (ref 135–145)

## 2014-08-04 LAB — CBC WITH DIFFERENTIAL/PLATELET
Basophils Absolute: 0 10*3/uL (ref 0.0–0.1)
Basophils Relative: 0.4 % (ref 0.0–3.0)
EOS ABS: 0.1 10*3/uL (ref 0.0–0.7)
Eosinophils Relative: 2.2 % (ref 0.0–5.0)
HEMATOCRIT: 38.3 % (ref 36.0–46.0)
Hemoglobin: 12.7 g/dL (ref 12.0–15.0)
Lymphocytes Relative: 41.1 % (ref 12.0–46.0)
Lymphs Abs: 2.3 10*3/uL (ref 0.7–4.0)
MCHC: 33.2 g/dL (ref 30.0–36.0)
MCV: 92.2 fl (ref 78.0–100.0)
Monocytes Absolute: 0.6 10*3/uL (ref 0.1–1.0)
Monocytes Relative: 10.1 % (ref 3.0–12.0)
NEUTROS PCT: 46.2 % (ref 43.0–77.0)
Neutro Abs: 2.5 10*3/uL (ref 1.4–7.7)
PLATELETS: 279 10*3/uL (ref 150.0–400.0)
RBC: 4.15 Mil/uL (ref 3.87–5.11)
RDW: 14.6 % (ref 11.5–15.5)
WBC: 5.5 10*3/uL (ref 4.0–10.5)

## 2014-08-04 LAB — LIPID PANEL
CHOLESTEROL: 195 mg/dL (ref 0–200)
HDL: 40.5 mg/dL (ref >39.00)
LDL CALC: 130 mg/dL — AB (ref 0–99)
NonHDL: 154.5
Total CHOL/HDL Ratio: 5
Triglycerides: 124 mg/dL (ref 0.0–149.0)
VLDL: 24.8 mg/dL (ref 0.0–40.0)

## 2014-08-04 LAB — HEPATIC FUNCTION PANEL
ALT: 35 U/L (ref 0–35)
AST: 26 U/L (ref 0–37)
Albumin: 3.8 g/dL (ref 3.5–5.2)
Alkaline Phosphatase: 69 U/L (ref 39–117)
BILIRUBIN DIRECT: 0.1 mg/dL (ref 0.0–0.3)
BILIRUBIN TOTAL: 0.7 mg/dL (ref 0.2–1.2)
Total Protein: 7 g/dL (ref 6.0–8.3)

## 2014-08-04 LAB — TSH: TSH: 3.98 u[IU]/mL (ref 0.35–4.50)

## 2014-08-04 NOTE — Progress Notes (Signed)
Subjective:    Patient ID: Anna [name], female    DOB: 12/19/1955, 58 y.o.   MRN: [id number]  HPI 58 year old female with past history of hypertension, hypercholesterolemia and hypothyroidism who comes in today for a scheduled follow up.  States overall she is doing relatively well.   No nausea or vomiting.  Acid reflux controlled on prilosec.  No abdominal pain or cramping.  Bowels stable.  Headaches no longer an issue for her.  She is followed by [facility] for retinitis.  Doing well regarding her depression.  On wellbutrin.  She initially noticed cracking of her left great toenail - eight months ago.  Has progressed.  Fungus involves only the left great toenail.  She has been off lipitor for 6 months.  Wanted to see what she could do with diet and exercise.     Past Medical History  Diagnosis Date  . Arthritis   . History of chicken pox   . Diverticulitis     H/O  . Frequent headaches     H/O  . GERD (gastroesophageal reflux disease)   . Hypothyroidism   . Hypercholesterolemia   . Hypertension   . Depression     Outpatient Encounter Prescriptions as of 08/04/2014  Medication Sig  . acyclovir (ZOVIRAX) 200 MG capsule Take by mouth 5 (five) times daily. As needed  . acyclovir cream (ZOVIRAX) 5 % Apply topically daily as needed.  Marland Kitchen atorvastatin (LIPITOR) 10 MG tablet TAKE 1 TABLET DAILY  . benazepril-hydrochlorthiazide (LOTENSIN HCT) 20-12.5 MG per tablet TAKE 1 TABLET DAILY  . buPROPion (WELLBUTRIN XL) 300 MG 24 hr tablet TAKE 1 TABLET DAILY  . EPINEPHrine (EPI-PEN) 0.3 mg/0.3 mL SOAJ injection Inject 0.3 mLs (0.3 mg total) into the muscle as needed (for anaphylaxis).  Marland Kitchen etodolac (LODINE) 400 MG tablet Take 1 tablet (400 mg total) by mouth 2 (two) times daily as needed.  . fluticasone (FLONASE) 50 MCG/ACT nasal spray Place 2 sprays into both nostrils daily.  Marland Kitchen levothyroxine (SYNTHROID, LEVOTHROID) 88 MCG tablet TAKE 1 TABLET DAILY BEFORE BREAKFAST  . omeprazole (PRILOSEC)  20 MG capsule TAKE 1 CAPSULE DAILY    Review of Systems Patient denies any headaches now.  No lightheadedness or dizziness.  No sinus or allergy symptoms.  No sob.  No chest pain or tightness.  No palpitations.  No increased cough or congestion. No nausea or vomiting.  Acid reflux controlled with prilosec.  No abdominal pain or cramping.  No bowel change, such as diarrhea, constipation, BRBPR or melana.  No urine change.   Toe nail fungus as outlined.       Objective:   Physical Exam  Filed Vitals:   08/04/14 0809  BP: 110/70  Pulse: 80  Temp: 98.3 F (36.8 C)   Blood pressure recheck:  71/39  58 year old female in no acute distress.   HEENT:  Nares- clear.  Oropharynx - without lesions. NECK:  Supple.  Nontender.  No audible bruit.  HEART:  Appears to be regular. LUNGS:  No crackles or wheezing audible.  Respirations even and unlabored.  RADIAL PULSE:  Equal bilaterally.  ABDOMEN:  Soft, nontender.  Bowel sounds present and normal.  No audible abdominal bruit.  EXTREMITIES:  No increased edema present.  DP pulses palpable and equal bilaterally.   Left great toe nail - fungus (isoloated to this toenail only).          Assessment & Plan:  HEALTH MAINTENANCE.  Physical 01/28/14.  Pap 01/28/14 negative with negative HPV.   Mammogram 09/23/13 - Birads I.  Referred to Dr Tiffany Kocher for colonoscopy.   Colonoscopy 03/25/14 normal.  Recommended f/u colonoscopy in five years.    I spent 25 minutes with the patient and more than 50% of the time was spent in consultation regarding the above.

## 2014-08-04 NOTE — Progress Notes (Signed)
Pre visit review using our clinic review tool, if applicable. No additional management support is needed unless otherwise documented below in the visit note. 

## 2014-08-05 ENCOUNTER — Encounter: Payer: Self-pay | Admitting: Internal Medicine

## 2014-08-07 ENCOUNTER — Encounter: Payer: Self-pay | Admitting: Internal Medicine

## 2014-08-07 DIAGNOSIS — B351 Tinea unguium: Secondary | ICD-10-CM | POA: Insufficient documentation

## 2014-08-07 MED ORDER — TERBINAFINE HCL 250 MG PO TABS: 250.0000 mg | ORAL_TABLET | Freq: Every day | ORAL | Status: DC

## 2014-08-07 NOTE — Assessment & Plan Note (Signed)
On thyroid replacement.  Follow tsh.  

## 2014-08-07 NOTE — Assessment & Plan Note (Signed)
Previous back pain.  Xray with arthritis changes.  Physical therapy.  Back better.

## 2014-08-07 NOTE — Assessment & Plan Note (Signed)
Followed at [facility].  Stable.

## 2014-08-07 NOTE — Assessment & Plan Note (Signed)
PAP 01/28/14 negative with negative HPV.

## 2014-08-07 NOTE — Assessment & Plan Note (Signed)
Previously had issues with headaches.  Related to her eyes.  Has retinitis.  Followed at [facility].  Stable.  No headaches now.

## 2014-08-07 NOTE — Assessment & Plan Note (Signed)
Toenail fungus as outlined.  Localized to one toe.  Discussed treatment options.  She wants to take lamisil.  She understands risk and possible side effects of the medication.  Off lipitor and will remain off.  Check liver panel today.  Will need f/u liver panel in one month.

## 2014-08-07 NOTE — Assessment & Plan Note (Signed)
Blood pressure controlled on current med regimen.  Check metabolic panel.  Follow.

## 2014-08-07 NOTE — Assessment & Plan Note (Signed)
On no medication.  Low cholesterol diet.  Follow lipid panel.

## 2014-08-07 NOTE — Assessment & Plan Note (Signed)
Using CPAP.  Follow.  

## 2014-08-07 NOTE — Assessment & Plan Note (Signed)
On omeprazole.  Controlled.   

## 2014-08-07 NOTE — Assessment & Plan Note (Signed)
Has had diverticulitis.  Last flare a few years ago.  Bowels stable.  Colonoscopy 03/25/14 normal.  Recommended f/u colonoscopy in five years.

## 2014-08-07 NOTE — Assessment & Plan Note (Signed)
Experienced some depression in taking care of her mother with alzheimers.  On wellbutrin and doing well.  Follow.

## 2014-08-07 NOTE — Assessment & Plan Note (Signed)
Colonoscopy 03/25/14 - normal.  Recommend f/u colonoscopy 5 years.

## 2014-09-07 ENCOUNTER — Other Ambulatory Visit: Payer: BC Managed Care – PPO

## 2014-09-07 ENCOUNTER — Telehealth: Payer: Self-pay | Admitting: *Deleted

## 2014-09-07 DIAGNOSIS — Z79899 Other long term (current) drug therapy: Secondary | ICD-10-CM

## 2014-09-07 NOTE — Telephone Encounter (Signed)
Order placed for liver panel.  

## 2014-09-07 NOTE — Telephone Encounter (Signed)
Pt is coming tomorrow what labs and dx?  

## 2014-09-08 ENCOUNTER — Encounter: Payer: Self-pay | Admitting: Internal Medicine

## 2014-09-08 ENCOUNTER — Other Ambulatory Visit (INDEPENDENT_AMBULATORY_CARE_PROVIDER_SITE_OTHER): Payer: BC Managed Care – PPO

## 2014-09-08 ENCOUNTER — Telehealth: Payer: Self-pay | Admitting: Internal Medicine

## 2014-09-08 DIAGNOSIS — Z79899 Other long term (current) drug therapy: Secondary | ICD-10-CM

## 2014-09-08 LAB — HEPATIC FUNCTION PANEL
ALBUMIN: 3.4 g/dL — AB (ref 3.5–5.2)
ALK PHOS: 66 U/L (ref 39–117)
ALT: 40 U/L — AB (ref 0–35)
AST: 44 U/L — AB (ref 0–37)
Bilirubin, Direct: 0.1 mg/dL (ref 0.0–0.3)
TOTAL PROTEIN: 7.4 g/dL (ref 6.0–8.3)
Total Bilirubin: 0.7 mg/dL (ref 0.2–1.2)

## 2014-09-08 NOTE — Telephone Encounter (Signed)
Pt notified of lab results via my chart.  Needs non fasting lab within one week.  Please schedule and contact her with a lab appt date and time.   Thanks.

## 2014-09-09 NOTE — Telephone Encounter (Signed)
Called patient & left voicemail on cell & asked pt to check mychart messages or give [place] a call back. Also advised patient to stop Lamisil since Liver fxn was increased.

## 2014-09-13 ENCOUNTER — Other Ambulatory Visit: Payer: Self-pay | Admitting: Internal Medicine

## 2014-09-15 ENCOUNTER — Telehealth: Payer: Self-pay | Admitting: *Deleted

## 2014-09-15 DIAGNOSIS — R945 Abnormal results of liver function studies: Secondary | ICD-10-CM

## 2014-09-15 DIAGNOSIS — R7989 Other specified abnormal findings of blood chemistry: Secondary | ICD-10-CM

## 2014-09-15 NOTE — Telephone Encounter (Signed)
Order placed for f/u liver panel.  

## 2014-09-15 NOTE — Telephone Encounter (Signed)
Pt is coming in tomorrow what labs and dx?  

## 2014-09-16 ENCOUNTER — Other Ambulatory Visit (INDEPENDENT_AMBULATORY_CARE_PROVIDER_SITE_OTHER): Payer: BC Managed Care – PPO

## 2014-09-16 DIAGNOSIS — R945 Abnormal results of liver function studies: Secondary | ICD-10-CM

## 2014-09-16 DIAGNOSIS — R7989 Other specified abnormal findings of blood chemistry: Secondary | ICD-10-CM

## 2014-09-16 LAB — HEPATIC FUNCTION PANEL
ALBUMIN: 3.4 g/dL — AB (ref 3.5–5.2)
ALK PHOS: 68 U/L (ref 39–117)
ALT: 33 U/L (ref 0–35)
AST: 26 U/L (ref 0–37)
BILIRUBIN DIRECT: 0 mg/dL (ref 0.0–0.3)
TOTAL PROTEIN: 7.3 g/dL (ref 6.0–8.3)
Total Bilirubin: 0.7 mg/dL (ref 0.2–1.2)

## 2014-09-19 ENCOUNTER — Encounter: Payer: Self-pay | Admitting: Internal Medicine

## 2014-09-24 ENCOUNTER — Other Ambulatory Visit: Payer: Self-pay | Admitting: Internal Medicine

## 2014-09-27 ENCOUNTER — Encounter: Payer: Self-pay | Admitting: Internal Medicine

## 2014-09-27 ENCOUNTER — Ambulatory Visit: Payer: Self-pay | Admitting: Internal Medicine

## 2014-09-29 ENCOUNTER — Ambulatory Visit: Payer: Self-pay | Admitting: Internal Medicine

## 2014-09-29 LAB — HM MAMMOGRAPHY: HM Mammogram: NEGATIVE

## 2014-09-30 ENCOUNTER — Encounter: Payer: Self-pay | Admitting: Internal Medicine

## 2015-02-10 ENCOUNTER — Encounter: Payer: BC Managed Care – PPO | Admitting: Internal Medicine

## 2015-02-21 ENCOUNTER — Encounter: Payer: Self-pay | Admitting: Internal Medicine

## 2015-03-16 ENCOUNTER — Other Ambulatory Visit: Payer: Self-pay | Admitting: Internal Medicine

## 2015-05-04 ENCOUNTER — Ambulatory Visit (INDEPENDENT_AMBULATORY_CARE_PROVIDER_SITE_OTHER): Payer: 59 | Admitting: Internal Medicine

## 2015-05-04 ENCOUNTER — Other Ambulatory Visit (HOSPITAL_COMMUNITY)
Admission: RE | Admit: 2015-05-04 | Discharge: 2015-05-04 | Disposition: A | Discharge status: Home / Self Care | Payer: 59 | Source: Ambulatory Visit | Attending: Internal Medicine | Admitting: Internal Medicine

## 2015-05-04 ENCOUNTER — Encounter: Payer: Self-pay | Admitting: Internal Medicine

## 2015-05-04 VITALS — BP 110/80 | HR 74 | Temp 98.1°F | Ht 64.25 in | Wt 189.2 lb

## 2015-05-04 DIAGNOSIS — E7800 Pure hypercholesterolemia, unspecified: Secondary | ICD-10-CM

## 2015-05-04 DIAGNOSIS — K219 Gastro-esophageal reflux disease without esophagitis: Secondary | ICD-10-CM

## 2015-05-04 DIAGNOSIS — Z8371 Family history of colonic polyps: Secondary | ICD-10-CM

## 2015-05-04 DIAGNOSIS — Z01419 Encounter for gynecological examination (general) (routine) without abnormal findings: Secondary | ICD-10-CM | POA: Insufficient documentation

## 2015-05-04 DIAGNOSIS — F329 Major depressive disorder, single episode, unspecified: Secondary | ICD-10-CM

## 2015-05-04 DIAGNOSIS — Z0000 Encounter for general adult medical examination without abnormal findings: Secondary | ICD-10-CM

## 2015-05-04 DIAGNOSIS — F32A Depression, unspecified: Secondary | ICD-10-CM

## 2015-05-04 DIAGNOSIS — I10 Essential (primary) hypertension: Secondary | ICD-10-CM

## 2015-05-04 DIAGNOSIS — E039 Hypothyroidism, unspecified: Secondary | ICD-10-CM | POA: Diagnosis not present

## 2015-05-04 DIAGNOSIS — Z1151 Encounter for screening for human papillomavirus (HPV): Secondary | ICD-10-CM | POA: Insufficient documentation

## 2015-05-04 DIAGNOSIS — G4733 Obstructive sleep apnea (adult) (pediatric): Secondary | ICD-10-CM | POA: Diagnosis not present

## 2015-05-04 DIAGNOSIS — Z83719 Family history of colon polyps, unspecified: Secondary | ICD-10-CM

## 2015-05-04 DIAGNOSIS — R87619 Unspecified abnormal cytological findings in specimens from cervix uteri: Secondary | ICD-10-CM

## 2015-05-04 LAB — BASIC METABOLIC PANEL
BUN: 14 mg/dL (ref 6–23)
CHLORIDE: 102 meq/L (ref 96–112)
CO2: 28 mEq/L (ref 19–32)
Calcium: 9.6 mg/dL (ref 8.4–10.5)
Creatinine, Ser: 0.87 mg/dL (ref 0.40–1.20)
GFR: 70.99 mL/min (ref >60.00)
Glucose, Bld: 90 mg/dL (ref 70–99)
POTASSIUM: 4.4 meq/L (ref 3.5–5.1)
SODIUM: 137 meq/L (ref 135–145)

## 2015-05-04 LAB — CBC WITH DIFFERENTIAL/PLATELET
BASOS PCT: 0.8 % (ref 0.0–3.0)
Basophils Absolute: 0.1 10*3/uL (ref 0.0–0.1)
Eosinophils Absolute: 0.2 10*3/uL (ref 0.0–0.7)
Eosinophils Relative: 2.4 % (ref 0.0–5.0)
HCT: 40.9 % (ref 36.0–46.0)
HEMOGLOBIN: 13.5 g/dL (ref 12.0–15.0)
Lymphocytes Relative: 42.4 % (ref 12.0–46.0)
Lymphs Abs: 2.8 10*3/uL (ref 0.7–4.0)
MCHC: 33.1 g/dL (ref 30.0–36.0)
MCV: 91.8 fl (ref 78.0–100.0)
MONO ABS: 0.7 10*3/uL (ref 0.1–1.0)
Monocytes Relative: 10.3 % (ref 3.0–12.0)
NEUTROS ABS: 2.9 10*3/uL (ref 1.4–7.7)
Neutrophils Relative %: 44.1 % (ref 43.0–77.0)
Platelets: 304 10*3/uL (ref 150.0–400.0)
RBC: 4.45 Mil/uL (ref 3.87–5.11)
RDW: 15 % (ref 11.5–15.5)
WBC: 6.6 10*3/uL (ref 4.0–10.5)

## 2015-05-04 LAB — LIPID PANEL
CHOLESTEROL: 198 mg/dL (ref 0–200)
HDL: 46.9 mg/dL (ref >39.00)
LDL CALC: 126 mg/dL — AB (ref 0–99)
NonHDL: 151.1
Total CHOL/HDL Ratio: 4
Triglycerides: 128 mg/dL (ref 0.0–149.0)
VLDL: 25.6 mg/dL (ref 0.0–40.0)

## 2015-05-04 LAB — HEPATIC FUNCTION PANEL
ALT: 34 U/L (ref 0–35)
AST: 25 U/L (ref 0–37)
Albumin: 4.2 g/dL (ref 3.5–5.2)
Alkaline Phosphatase: 68 U/L (ref 39–117)
Bilirubin, Direct: 0.1 mg/dL (ref 0.0–0.3)
Total Bilirubin: 0.5 mg/dL (ref 0.2–1.2)
Total Protein: 7.2 g/dL (ref 6.0–8.3)

## 2015-05-04 LAB — TSH: TSH: 3.22 u[IU]/mL (ref 0.35–4.50)

## 2015-05-04 MED ORDER — BUPROPION HCL ER (XL) 300 MG PO TB24: 300.0000 mg | ORAL_TABLET | Freq: Every day | ORAL | Status: DC

## 2015-05-04 MED ORDER — LEVOTHYROXINE SODIUM 88 MCG PO TABS: 88.0000 ug | ORAL_TABLET | Freq: Every day | ORAL | Status: DC

## 2015-05-04 MED ORDER — BENAZEPRIL-HYDROCHLOROTHIAZIDE 20-12.5 MG PO TABS: 1.0000 | ORAL_TABLET | Freq: Every day | ORAL | Status: DC

## 2015-05-04 MED ORDER — OMEPRAZOLE 20 MG PO CPDR: 20.0000 mg | DELAYED_RELEASE_CAPSULE | Freq: Every day | ORAL | Status: DC

## 2015-05-04 MED ORDER — EPINEPHRINE 0.3 MG/0.3ML IJ SOAJ: 0.3000 mg | INTRAMUSCULAR | Status: DC | PRN

## 2015-05-04 NOTE — Progress Notes (Signed)
Patient ID: Anna [name], female   DOB: Dec 05, 1955, 59 y.o.   MRN: [id number]   Subjective:    Patient ID: Anna [name], female    DOB: 1956/09/03, 59 y.o.   MRN: [id number]  HPI  Patient here to follow up on her current medical issues as well as for a complete physical exam.   She is under increased stress.  Her husband was recently diagnosed with colon cancer.  We discussed this at length today.  Has good support.  Tries to stay active.  No cardiac symptoms with increased activity or exertion.  Breathing stable.  Bowels stable.  Wants pap smear.  States paternal grandmother and great grandmother - cervical cancer.     Past Medical History  Diagnosis Date  . Arthritis   . History of chicken pox   . Diverticulitis     H/O  . Frequent headaches     H/O  . GERD (gastroesophageal reflux disease)   . Hypothyroidism   . Hypercholesterolemia   . Hypertension   . Depression     Current Outpatient Prescriptions on File Prior to Visit  Medication Sig Dispense Refill  . acyclovir (ZOVIRAX) 200 MG capsule Take by mouth 5 (five) times daily. As needed    . acyclovir cream (ZOVIRAX) 5 % Apply topically daily as needed.     No current facility-administered medications on file prior to visit.    Review of Systems  Constitutional: Negative for appetite change and unexpected weight change.  HENT: Negative for congestion and sinus pressure.   Eyes: Negative for pain and visual disturbance.  Respiratory: Negative for cough, chest tightness and shortness of breath.   Cardiovascular: Negative for chest pain, palpitations and leg swelling.  Gastrointestinal: Negative for nausea, vomiting, abdominal pain and diarrhea.  Genitourinary: Negative for frequency and difficulty urinating.  Musculoskeletal: Negative for back pain and joint swelling.  Skin: Negative for color change and rash.  Neurological: Negative for dizziness, light-headedness and headaches.  Hematological: Negative  for adenopathy. Does not bruise/bleed easily.  Psychiatric/Behavioral: Negative for dysphoric mood and agitation.       Objective:     Pulse recheck:  64  Physical Exam  Constitutional: She is oriented to person, place, and time. She appears well-developed and well-nourished.  HENT:  Nose: Nose normal.  Mouth/Throat: Oropharynx is clear and moist.  Eyes: Right eye exhibits no discharge. Left eye exhibits no discharge. No scleral icterus.  Neck: Neck supple. No thyromegaly present.  Cardiovascular: Normal rate and regular rhythm.   Pulmonary/Chest: Breath sounds normal. No accessory muscle usage. No tachypnea. No respiratory distress. She has no decreased breath sounds. She has no wheezes. She has no rhonchi. Right breast exhibits no inverted nipple, no mass, no nipple discharge and no tenderness (no axillary adenopathy). Left breast exhibits no inverted nipple, no mass, no nipple discharge and no tenderness (no axilarry adenopathy).  Abdominal: Soft. Bowel sounds are normal. There is no tenderness.  Genitourinary:  Normal external genitalia.  Vaginal vault without lesions.  Cervix identified.  Pap smear performed.  Could not appreciate any adnexal masses or tenderness.    Musculoskeletal: She exhibits no edema or tenderness.  Lymphadenopathy:    She has no cervical adenopathy.  Neurological: She is alert and oriented to person, place, and time.  Skin: Skin is warm. No rash noted.  Psychiatric: She has a normal mood and affect. Her behavior is normal.    BP 110/80 mmHg  Pulse 74  Temp(Src) 98.1 F (36.7  C) (Oral)  Ht 5' 4.25" (1.632 m)  Wt 189 lb 4 oz (85.843 kg)  BMI 32.23 kg/m2  SpO2 96% Wt Readings from Last 3 Encounters:  05/04/15 189 lb 4 oz (85.843 kg)  08/04/14 186 lb 8 oz (84.596 kg)  01/28/14 186 lb 8 oz (84.596 kg)     Lab Results  Component Value Date   WBC 6.6 05/04/2015   HGB 13.5 05/04/2015   HCT 40.9 05/04/2015   PLT 304.0 05/04/2015   GLUCOSE 90  05/04/2015   CHOL 198 05/04/2015   TRIG 128.0 05/04/2015   HDL 46.90 05/04/2015   LDLDIRECT 175.2 09/07/2013   LDLCALC 126* 05/04/2015   ALT 34 05/04/2015   AST 25 05/04/2015   NA 137 05/04/2015   K 4.4 05/04/2015   CL 102 05/04/2015   CREATININE 0.87 05/04/2015   BUN 14 05/04/2015   CO2 28 05/04/2015   TSH 3.22 05/04/2015       Assessment & Plan:   Problem List Items Addressed This Visit    Abnormal Pap smear of cervix    Has a history of abnormal pap smear.  PAP 01/28/14 negative with negative HPV.  Wanted f/u pap today.  Pap today.       Depression    On wellbutrin.  Increased stress as outlined.  Discussed at length with her today.  Has good support.  Will notify me if she feels she needs anything more.  Follow.       Relevant Medications   buPROPion (WELLBUTRIN XL) 300 MG 24 hr tablet   Essential hypertension, benign - Primary    Blood pressure doing well.  Same medication regimen.  Follow pressures.  Follow metabolic panel.        Relevant Medications   benazepril-hydrochlorthiazide (LOTENSIN HCT) 20-12.5 MG per tablet   EPINEPHrine 0.3 mg/0.3 mL IJ SOAJ injection   Other Relevant Orders   Basic metabolic panel (Completed)   Cytology - PAP (Completed)   Family history of colonic polyps    Colonoscopy 03/25/14 - normal.  Recommended f/u colonoscopy in 5 years.        GERD (gastroesophageal reflux disease)    On omeprazole.       Relevant Medications   omeprazole (PRILOSEC) 20 MG capsule   Health care maintenance    Physical today 05/04/15.  PAP today.  Mammogram 09/29/14 - Birads I. Colonoscopy 03/25/14 - normal.  Recommended f/u colonoscopy in five years.        Hypercholesterolemia   Relevant Medications   benazepril-hydrochlorthiazide (LOTENSIN HCT) 20-12.5 MG per tablet   EPINEPHrine 0.3 mg/0.3 mL IJ SOAJ injection   Other Relevant Orders   Lipid panel (Completed)   Hepatic function panel (Completed)   Cytology - PAP (Completed)   Hypothyroidism     On thyroid replacement.  Follow tsh.        Relevant Medications   levothyroxine (SYNTHROID, LEVOTHROID) 88 MCG tablet   Other Relevant Orders   CBC with Differential/Platelet (Completed)   TSH (Completed)   Cytology - PAP (Completed)   Obstructive sleep apnea    Continue CPAP.         I spent 25 minutes with the patient and more than 50% of the time was spent in consultation regarding the above.     Einar Pheasant, MD

## 2015-05-04 NOTE — Progress Notes (Signed)
Pre visit review using our clinic review tool, if applicable. No additional management support is needed unless otherwise documented below in the visit note. 

## 2015-05-05 ENCOUNTER — Encounter: Payer: Self-pay | Admitting: Internal Medicine

## 2015-05-05 LAB — CYTOLOGY - PAP

## 2015-05-08 ENCOUNTER — Encounter: Payer: Self-pay | Admitting: Internal Medicine

## 2015-05-08 DIAGNOSIS — Z0000 Encounter for general adult medical examination without abnormal findings: Secondary | ICD-10-CM | POA: Insufficient documentation

## 2015-05-08 NOTE — Assessment & Plan Note (Signed)
Blood pressure doing well.  Same medication regimen.  Follow pressures.  Follow metabolic panel.   

## 2015-05-08 NOTE — Assessment & Plan Note (Signed)
On wellbutrin.  Increased stress as outlined.  Discussed at length with her today.  Has good support.  Will notify me if she feels she needs anything more.  Follow.

## 2015-05-08 NOTE — Assessment & Plan Note (Signed)
On omeprazole.  

## 2015-05-08 NOTE — Assessment & Plan Note (Signed)
Has a history of abnormal pap smear.  PAP 01/28/14 negative with negative HPV.  Wanted f/u pap today.  Pap today.

## 2015-05-08 NOTE — Assessment & Plan Note (Signed)
Physical today 05/04/15.  PAP today.  Mammogram 09/29/14 - Birads I. Colonoscopy 03/25/14 - normal.  Recommended f/u colonoscopy in five years.

## 2015-05-08 NOTE — Assessment & Plan Note (Signed)
Colonoscopy 03/25/14 - normal.  Recommended f/u colonoscopy in 5 years.

## 2015-05-08 NOTE — Assessment & Plan Note (Signed)
On thyroid replacement.  Follow tsh.  

## 2015-05-08 NOTE — Assessment & Plan Note (Signed)
Continue CPAP.  

## 2015-05-09 ENCOUNTER — Encounter: Payer: Self-pay | Admitting: Internal Medicine

## 2015-05-09 ENCOUNTER — Ambulatory Visit (INDEPENDENT_AMBULATORY_CARE_PROVIDER_SITE_OTHER): Payer: 59 | Admitting: Internal Medicine

## 2015-05-09 VITALS — BP 122/76 | HR 86 | Temp 97.7°F | Wt 188.8 lb

## 2015-05-09 DIAGNOSIS — R350 Frequency of micturition: Secondary | ICD-10-CM

## 2015-05-09 DIAGNOSIS — R3915 Urgency of urination: Secondary | ICD-10-CM

## 2015-05-09 LAB — POCT URINALYSIS DIPSTICK
Bilirubin, UA: NEGATIVE
Blood, UA: NEGATIVE
Glucose, UA: NEGATIVE
Ketones, UA: NEGATIVE
LEUKOCYTES UA: NEGATIVE
NITRITE UA: NEGATIVE
PROTEIN UA: NEGATIVE
Spec Grav, UA: 1.02
Urobilinogen, UA: NEGATIVE
pH, UA: 6

## 2015-05-09 MED ORDER — NITROFURANTOIN MONOHYD MACRO 100 MG PO CAPS: 100.0000 mg | ORAL_CAPSULE | Freq: Two times a day (BID) | ORAL | Status: DC

## 2015-05-09 NOTE — Addendum Note (Signed)
Addended by: Lurlean Nanny on: 05/09/2015 02:37 PM   Modules accepted: Orders

## 2015-05-09 NOTE — Patient Instructions (Signed)

## 2015-05-09 NOTE — Progress Notes (Signed)
HPI  Pt presents to the clinic today with c/o urinary frequency, urgency, burning sensation and low back pain. This started 2 days ago. She has also noticed some blood in her urine. She denies fever, chills or nausea. She has not tried anything OTC. She denies any vaginal complaints.   Review of Systems  Past Medical History  Diagnosis Date  . Arthritis   . History of chicken pox   . Diverticulitis     H/O  . Frequent headaches     H/O  . GERD (gastroesophageal reflux disease)   . Hypothyroidism   . Hypercholesterolemia   . Hypertension   . Depression     Family History  Problem Relation Age of Onset  . Hyperlipidemia Mother   . Hypertension Mother   . Alzheimer's disease Mother   . Arthritis Father   . Hyperlipidemia Father   . Heart disease Father     first MI age 37  . Hypertension Father   . Hyperlipidemia Brother   . Heart disease Brother     heart disease dx at a youg age  . Hypertension Brother   . Alzheimer's disease Maternal Aunt   . Hyperlipidemia Maternal Uncle   . Heart disease Maternal Uncle   . Sudden death Maternal Uncle 42    massive heart attack in doctors office  . Arthritis Maternal Grandmother   . Hyperlipidemia Maternal Grandmother   . Stroke Maternal Grandmother   . Hypertension Maternal Grandmother   . Alzheimer's disease Maternal Grandmother   . Alcohol abuse Maternal Grandfather   . Hypertension Maternal Grandfather   . Diabetes Maternal Grandfather   . Cancer Paternal Grandmother     Ovarian  . Hypertension Paternal Grandmother   . Alcohol abuse Paternal Grandfather   . Hyperlipidemia Paternal Grandfather   . Heart disease Paternal Grandfather   . Hypertension Paternal Grandfather     History   Social History  . Marital Status: Married    Spouse Name: N/A  . Number of Children: 2  . Years of Education: N/A   Occupational History  . Not on file.   Social History Main Topics  . Smoking status: Never Smoker   . Smokeless  tobacco: Never Used  . Alcohol Use: No  . Drug Use: No  . Sexual Activity: Not on file   Other Topics Concern  . Not on file   Social History Narrative    No Known Allergies  Constitutional: Denies fever, malaise, fatigue, headache or abrupt weight changes.   GU: Pt reports urgency, frequency and pain with urination. Denies odor or discharge. Skin: Denies redness, rashes, lesions or ulcercations.   No other specific complaints in a complete review of systems (except as listed in HPI above).    Objective:   Physical Exam  BP 122/76 mmHg  Pulse 86  Temp(Src) 97.7 F (36.5 C) (Oral)  Wt 188 lb 12.8 oz (85.639 kg)  SpO2 98%  Wt Readings from Last 3 Encounters:  05/09/15 188 lb 12.8 oz (85.639 kg)  05/04/15 189 lb 4 oz (85.843 kg)  08/04/14 186 lb 8 oz (84.596 kg)    General: Appears her stated age, well developed, well nourished in NAD. Cardiovascular: Normal rate and rhythm. S1,S2 noted.  No murmur, rubs or gallops noted.  Pulmonary/Chest: Normal effort and positive vesicular breath sounds. No respiratory distress. No wheezes, rales or ronchi noted.  Abdomen: Soft and nontender. Normal bowel sounds, no bruits noted. No distention or masses noted.  No CVA  tenderness.      Assessment & Plan:   Urgency, Frequency, Burning Sensation:  Urinalysis: normal Will send urine culture (if negative will stop antibiotic) Given her symptoms will treat with eRx sent if for Macrobid 100 mg BID x 5 days OK to take AZO OTC Drink plenty of fluids  RTC as needed or if symptoms persist.

## 2015-05-09 NOTE — Progress Notes (Signed)
Pre visit review using our clinic review tool, if applicable. No additional management support is needed unless otherwise documented below in the visit note. 

## 2015-05-11 LAB — URINE CULTURE

## 2015-05-15 NOTE — Telephone Encounter (Signed)
Unread mychart message mailed to patient 

## 2015-08-20 ENCOUNTER — Other Ambulatory Visit: Payer: Self-pay | Admitting: Internal Medicine

## 2015-08-21 NOTE — Telephone Encounter (Signed)
Refilled wellbutrin #90 with no refills.  She needs a f/u appt in the next 1-2 months.

## 2015-08-21 NOTE — Telephone Encounter (Signed)
Received a refill request for Wellbutrin. Last refilled 05/04/15 for #90 with 0 refills. Last office visit 05/04/15. Okay to refill this medication?

## 2015-08-23 ENCOUNTER — Encounter: Payer: Self-pay | Admitting: *Deleted

## 2015-08-31 NOTE — Telephone Encounter (Signed)
Unread mychart message mailed to patient 

## 2015-09-18 ENCOUNTER — Encounter: Payer: Self-pay | Admitting: Internal Medicine

## 2015-09-18 ENCOUNTER — Telehealth: Payer: Self-pay | Admitting: Internal Medicine

## 2015-09-18 NOTE — Telephone Encounter (Signed)
Dr. Scott please advise.

## 2015-09-18 NOTE — Telephone Encounter (Signed)
Patient unavailable until the 9th.   Dealing with a patient going through chemo, Please advise?

## 2015-09-18 NOTE — Telephone Encounter (Signed)
Pt called about needing a medication refill appt. Medication is acyclovir cream (ZOVIRAX) 5 % No avail appt to sch, Let me know where to sch after speaking to Dr Nicki Reaper. Thank you!

## 2015-09-18 NOTE — Telephone Encounter (Signed)
I can see her on 09/20/15 at 10:00 if needs an appt.  Please block 30 minutes.  Thanks

## 2015-09-18 NOTE — Telephone Encounter (Signed)
What is going on and what does she need refill for?  May just be able to refill until can get in, unless having acute issues that needs evaluation.

## 2015-09-19 MED ORDER — ACYCLOVIR 400 MG PO TABS: ORAL_TABLET | ORAL | Status: DC

## 2015-09-19 MED ORDER — ACYCLOVIR 5 % EX CREA: TOPICAL_CREAM | CUTANEOUS | Status: DC

## 2015-09-19 NOTE — Telephone Encounter (Signed)
Left a message for her to return my call for clarification.

## 2015-09-19 NOTE — Telephone Encounter (Signed)
rx sent in for acyclovir cream (15 g) with no refills.  Also sen in rx for acyclovir 400mg  tid for 5 days prn flare #30 with one refill.  Pt notified via my chart.

## 2015-09-20 ENCOUNTER — Telehealth: Payer: Self-pay

## 2015-09-20 ENCOUNTER — Other Ambulatory Visit: Payer: Self-pay | Admitting: *Deleted

## 2015-09-20 NOTE — Telephone Encounter (Signed)
[facility] with [facility], patient has review case # [id number].  Form to be faxed over to the office.

## 2015-09-22 MED ORDER — ACYCLOVIR 5 % EX CREA: TOPICAL_CREAM | CUTANEOUS | Status: DC

## 2015-09-26 ENCOUNTER — Other Ambulatory Visit: Payer: Self-pay

## 2015-10-06 NOTE — Telephone Encounter (Addendum)
PA for Zovirax was denied on 09/26/15. Pt notified via mychart.

## 2015-11-18 ENCOUNTER — Other Ambulatory Visit: Payer: Self-pay | Admitting: Internal Medicine

## 2015-11-21 NOTE — Telephone Encounter (Signed)
I have not seen her since 04/2015.  She needs a f/u appt with me.  Once scheduled, can refill until appt.

## 2015-11-21 NOTE — Telephone Encounter (Signed)
Please advise as last OV was in June 2016.  Thanks

## 2015-11-23 NOTE — Telephone Encounter (Signed)
Left patient a message about a follow up appt.

## 2015-11-27 NOTE — Telephone Encounter (Signed)
I don't want her to run out of her medications.  Please call her to schedule an appt and then can refill.  Thanks

## 2015-11-28 ENCOUNTER — Other Ambulatory Visit: Payer: Self-pay | Admitting: Internal Medicine

## 2015-11-28 MED ORDER — BUPROPION HCL ER (XL) 300 MG PO TB24: 300.0000 mg | ORAL_TABLET | Freq: Every day | ORAL | Status: DC

## 2015-11-28 NOTE — Progress Notes (Signed)
rx ok'd for wellbutrin #90 with no refills.

## 2015-11-28 NOTE — Telephone Encounter (Signed)
I sent in rx for wellbutrin - #90 with no refills.  Sent to [employer].

## 2015-12-05 ENCOUNTER — Ambulatory Visit (INDEPENDENT_AMBULATORY_CARE_PROVIDER_SITE_OTHER): Payer: 59 | Admitting: Internal Medicine

## 2015-12-05 ENCOUNTER — Telehealth: Payer: Self-pay | Admitting: Internal Medicine

## 2015-12-05 ENCOUNTER — Encounter: Payer: Self-pay | Admitting: Internal Medicine

## 2015-12-05 VITALS — BP 110/80 | HR 86 | Temp 98.1°F | Resp 18 | Ht 64.25 in | Wt 185.5 lb

## 2015-12-05 DIAGNOSIS — F32A Depression, unspecified: Secondary | ICD-10-CM

## 2015-12-05 DIAGNOSIS — Z8371 Family history of colonic polyps: Secondary | ICD-10-CM

## 2015-12-05 DIAGNOSIS — I10 Essential (primary) hypertension: Secondary | ICD-10-CM

## 2015-12-05 DIAGNOSIS — F329 Major depressive disorder, single episode, unspecified: Secondary | ICD-10-CM

## 2015-12-05 DIAGNOSIS — G4733 Obstructive sleep apnea (adult) (pediatric): Secondary | ICD-10-CM

## 2015-12-05 DIAGNOSIS — Z1239 Encounter for other screening for malignant neoplasm of breast: Secondary | ICD-10-CM | POA: Diagnosis not present

## 2015-12-05 DIAGNOSIS — L989 Disorder of the skin and subcutaneous tissue, unspecified: Secondary | ICD-10-CM

## 2015-12-05 DIAGNOSIS — E7800 Pure hypercholesterolemia, unspecified: Secondary | ICD-10-CM

## 2015-12-05 DIAGNOSIS — K219 Gastro-esophageal reflux disease without esophagitis: Secondary | ICD-10-CM

## 2015-12-05 DIAGNOSIS — E039 Hypothyroidism, unspecified: Secondary | ICD-10-CM

## 2015-12-05 DIAGNOSIS — Z23 Encounter for immunization: Secondary | ICD-10-CM | POA: Diagnosis not present

## 2015-12-05 MED ORDER — OMEPRAZOLE 20 MG PO CPDR: 20.0000 mg | DELAYED_RELEASE_CAPSULE | Freq: Two times a day (BID) | ORAL | Status: DC

## 2015-12-05 NOTE — Progress Notes (Signed)
Patient ID: Anna [name], female   DOB: May 20, 1956, 60 y.o.   MRN: [id number]   Subjective:    Patient ID: Anna [name], female    DOB: Jun 10, 1956, 60 y.o.   MRN: [id number]  HPI  Patient with past history of hypercholesterolemia, GERD, OSA, hypothyroidism and depression.  She comes in today to follow up on these issues.  She has CPAP for OSA.  For the last 3-4 months, she feels she is not sleeping well.  Feels the pressure is too high.  Tries to stay active.  No cardiac symptoms with increased activity or exertion.  No sob.  Does report some acid reflux.  Takes omeprazole.  No abdominal pain or cramping.  Bowels stable.  She also has a persistent left upper arm lesion.  Discussed dermatology referral.     Past Medical History  Diagnosis Date  . Arthritis   . History of chicken pox   . Diverticulitis     H/O  . Frequent headaches     H/O  . GERD (gastroesophageal reflux disease)   . Hypothyroidism   . Hypercholesterolemia   . Hypertension   . Depression    Past Surgical History  Procedure Laterality Date  . Appendectomy  1981  . Cesarean section  [place]  . Carpal tunnel release  2000  . Tubal ligation  1985  . Eye surgery Bilateral 2008    Lasix eye srg   Family History  Problem Relation Age of Onset  . Hyperlipidemia Mother   . Hypertension Mother   . Alzheimer's disease Mother   . Arthritis Father   . Hyperlipidemia Father   . Heart disease Father     first MI age 79  . Hypertension Father   . Hyperlipidemia Brother   . Heart disease Brother     heart disease dx at a youg age  . Hypertension Brother   . Alzheimer's disease Maternal Aunt   . Hyperlipidemia Maternal Uncle   . Heart disease Maternal Uncle   . Sudden death Maternal Uncle 42    massive heart attack in doctors office  . Arthritis Maternal Grandmother   . Hyperlipidemia Maternal Grandmother   . Stroke Maternal Grandmother   . Hypertension Maternal Grandmother   . Alzheimer's  disease Maternal Grandmother   . Alcohol abuse Maternal Grandfather   . Hypertension Maternal Grandfather   . Diabetes Maternal Grandfather   . Cancer Paternal Grandmother     Ovarian  . Hypertension Paternal Grandmother   . Alcohol abuse Paternal Grandfather   . Hyperlipidemia Paternal Grandfather   . Heart disease Paternal Grandfather   . Hypertension Paternal Grandfather    Social History   Social History  . Marital Status: Married    Spouse Name: N/A  . Number of Children: 2  . Years of Education: N/A   Social History Main Topics  . Smoking status: Never Smoker   . Smokeless tobacco: Never Used  . Alcohol Use: No  . Drug Use: No  . Sexual Activity: Not Asked   Other Topics Concern  . None   Social History Narrative    Outpatient Encounter Prescriptions as of 12/05/2015  Medication Sig  . acyclovir (ZOVIRAX) 400 MG tablet Take one tablet tid for five days prn flares.  Marland Kitchen acyclovir cream (ZOVIRAX) 5 % Use as directed. (Patient taking differently: as needed. Use as directed.)  . benazepril-hydrochlorthiazide (LOTENSIN HCT) 20-12.5 MG per tablet Take 1 tablet by mouth daily.  Marland Kitchen buPROPion [facility]  XL) 300 MG 24 hr tablet Take 1 tablet (300 mg total) by mouth daily.  Marland Kitchen EPINEPHrine 0.3 mg/0.3 mL IJ SOAJ injection Inject 0.3 mLs (0.3 mg total) into the muscle as needed (for anaphylaxis).  Marland Kitchen levothyroxine (SYNTHROID, LEVOTHROID) 88 MCG tablet Take 1 tablet (88 mcg total) by mouth daily before breakfast.  . omeprazole (PRILOSEC) 20 MG capsule Take 1 capsule (20 mg total) by mouth 2 (two) times daily before a meal.  . [DISCONTINUED] omeprazole (PRILOSEC) 20 MG capsule Take 1 capsule (20 mg total) by mouth daily.  . [DISCONTINUED] nitrofurantoin, macrocrystal-monohydrate, (MACROBID) 100 MG capsule Take 1 capsule (100 mg total) by mouth 2 (two) times daily.   No facility-administered encounter medications on file as of 12/05/2015.    Review of Systems  Constitutional: Negative  for appetite change and unexpected weight change.  HENT: Negative for congestion and sinus pressure.   Eyes: Negative for discharge and redness.  Respiratory: Negative for cough, chest tightness and shortness of breath.   Cardiovascular: Negative for chest pain, palpitations and leg swelling.  Gastrointestinal: Negative for nausea, vomiting, abdominal pain and diarrhea.  Genitourinary: Negative for dysuria and difficulty urinating.  Musculoskeletal: Negative for back pain and joint swelling.  Skin: Negative for color change and rash.  Neurological: Negative for dizziness, light-headedness and headaches.  Psychiatric/Behavioral: Positive for sleep disturbance. Negative for dysphoric mood and agitation.       Objective:    Physical Exam  Constitutional: She appears well-developed and well-nourished. No distress.  HENT:  Nose: Nose normal.  Mouth/Throat: Oropharynx is clear and moist.  Eyes: Conjunctivae are normal. Right eye exhibits no discharge. Left eye exhibits no discharge.  Neck: Neck supple. No thyromegaly present.  Cardiovascular: Normal rate and regular rhythm.   Pulmonary/Chest: Breath sounds normal. No respiratory distress. She has no wheezes.  Abdominal: Soft. Bowel sounds are normal. There is no tenderness.  Musculoskeletal: She exhibits no edema or tenderness.  Lymphadenopathy:    She has no cervical adenopathy.  Skin: No rash noted. No erythema.  Psychiatric: She has a normal mood and affect. Her behavior is normal.    BP 110/80 mmHg  Pulse 86  Temp(Src) 98.1 F (36.7 C) (Oral)  Resp 18  Ht 5' 4.25" (1.632 m)  Wt 185 lb 8 oz (84.142 kg)  BMI 31.59 kg/m2  SpO2 97% Wt Readings from Last 3 Encounters:  12/05/15 185 lb 8 oz (84.142 kg)  05/09/15 188 lb 12.8 oz (85.639 kg)  05/04/15 189 lb 4 oz (85.843 kg)     Lab Results  Component Value Date   WBC 6.6 05/04/2015   HGB 13.5 05/04/2015   HCT 40.9 05/04/2015   PLT 304.0 05/04/2015   GLUCOSE 90 05/04/2015     CHOL 198 05/04/2015   TRIG 128.0 05/04/2015   HDL 46.90 05/04/2015   LDLDIRECT 175.2 09/07/2013   LDLCALC 126* 05/04/2015   ALT 34 05/04/2015   AST 25 05/04/2015   NA 137 05/04/2015   K 4.4 05/04/2015   CL 102 05/04/2015   CREATININE 0.87 05/04/2015   BUN 14 05/04/2015   CO2 28 05/04/2015   TSH 3.22 05/04/2015       Assessment & Plan:   Problem List Items Addressed This Visit    Arm skin lesion, left - Primary    Refer to dermatology for evaluation.        Relevant Orders   Ambulatory referral to Dermatology   Depression    On wellbutrin.  Stable.  Essential hypertension, benign    Blood pressure under good control.  Continue same medication regimen.  Follow pressures.  Follow metabolic panel.        Relevant Orders   Basic metabolic panel   Family history of colonic polyps    Colonoscopy 03/25/14 - normal.  Recommended f/u colonoscopy in five years.       GERD (gastroesophageal reflux disease)    On omeprazole.  Increase omeprazole to bid.  Follow.  Get her back in soon to reassess.        Relevant Medications   omeprazole (PRILOSEC) 20 MG capsule   Hypercholesterolemia    Low cholesterol diet and exercise.  Follow lipid panel.        Relevant Orders   Hepatic function panel   Lipid panel   Hypothyroidism    On thyroid replacement.  Follow tsh.        Obstructive sleep apnea    CPAP.  Not sleeping well.  Feels increased pressure.  Will have Sleep Med set up for auto titration.         Other Visit Diagnoses    Breast cancer screening        Relevant Orders    MM Digital Screening    Encounter for immunization            Einar Pheasant, MD

## 2015-12-05 NOTE — Progress Notes (Signed)
Pre-visit discussion using our clinic review tool. No additional management support is needed unless otherwise documented below in the visit note.  

## 2015-12-05 NOTE — Telephone Encounter (Signed)
Pt states she was told to get lab work done. Need orders please and thank you!

## 2015-12-05 NOTE — Telephone Encounter (Signed)
Per check out box - please schedule fasting labs in 1-2 weeks.  Thanks

## 2015-12-11 ENCOUNTER — Encounter: Payer: Self-pay | Admitting: Internal Medicine

## 2015-12-11 DIAGNOSIS — L989 Disorder of the skin and subcutaneous tissue, unspecified: Secondary | ICD-10-CM | POA: Insufficient documentation

## 2015-12-11 NOTE — Assessment & Plan Note (Signed)
Refer to dermatology for evaluation. 

## 2015-12-11 NOTE — Assessment & Plan Note (Signed)
Low cholesterol diet and exercise.  Follow lipid panel.   

## 2015-12-11 NOTE — Assessment & Plan Note (Signed)
CPAP.  Not sleeping well.  Feels increased pressure.  Will have Sleep Med set up for auto titration.

## 2015-12-11 NOTE — Assessment & Plan Note (Signed)
On thyroid replacement.  Follow tsh.  

## 2015-12-11 NOTE — Assessment & Plan Note (Signed)
Colonoscopy 03/25/14 - normal.  Recommended f/u colonoscopy in five years.

## 2015-12-11 NOTE — Assessment & Plan Note (Signed)
Blood pressure under good control.  Continue same medication regimen.  Follow pressures.  Follow metabolic panel.   

## 2015-12-11 NOTE — Assessment & Plan Note (Signed)
On wellbutrin.  Stable.  

## 2015-12-11 NOTE — Assessment & Plan Note (Signed)
On omeprazole.  Increase omeprazole to bid.  Follow.  Get her back in soon to reassess.

## 2015-12-12 ENCOUNTER — Other Ambulatory Visit: Payer: 59

## 2015-12-13 ENCOUNTER — Ambulatory Visit: Payer: 59

## 2015-12-14 ENCOUNTER — Telehealth: Payer: Self-pay | Admitting: Internal Medicine

## 2015-12-14 ENCOUNTER — Ambulatory Visit (INDEPENDENT_AMBULATORY_CARE_PROVIDER_SITE_OTHER)
Admission: RE | Admit: 2015-12-14 | Discharge: 2015-12-14 | Disposition: A | Discharge status: Home / Self Care | Payer: 59 | Source: Ambulatory Visit | Attending: Family Medicine | Admitting: Family Medicine

## 2015-12-14 ENCOUNTER — Ambulatory Visit (INDEPENDENT_AMBULATORY_CARE_PROVIDER_SITE_OTHER): Payer: 59 | Admitting: Family Medicine

## 2015-12-14 ENCOUNTER — Encounter: Payer: Self-pay | Admitting: Family Medicine

## 2015-12-14 DIAGNOSIS — R05 Cough: Secondary | ICD-10-CM

## 2015-12-14 DIAGNOSIS — R0602 Shortness of breath: Secondary | ICD-10-CM

## 2015-12-14 DIAGNOSIS — R059 Cough, unspecified: Secondary | ICD-10-CM | POA: Insufficient documentation

## 2015-12-14 LAB — COMPREHENSIVE METABOLIC PANEL
ALT: 31 U/L (ref 0–35)
AST: 28 U/L (ref 0–37)
Albumin: 4.2 g/dL (ref 3.5–5.2)
Alkaline Phosphatase: 71 U/L (ref 39–117)
BILIRUBIN TOTAL: 0.4 mg/dL (ref 0.2–1.2)
BUN: 13 mg/dL (ref 6–23)
CALCIUM: 9.3 mg/dL (ref 8.4–10.5)
CHLORIDE: 100 meq/L (ref 96–112)
CO2: 29 meq/L (ref 19–32)
CREATININE: 0.83 mg/dL (ref 0.40–1.20)
GFR: 74.79 mL/min (ref >60.00)
GLUCOSE: 92 mg/dL (ref 70–99)
Potassium: 3.9 mEq/L (ref 3.5–5.1)
SODIUM: 137 meq/L (ref 135–145)
Total Protein: 7.5 g/dL (ref 6.0–8.3)

## 2015-12-14 LAB — CBC
HCT: 42.1 % (ref 36.0–46.0)
Hemoglobin: 14.1 g/dL (ref 12.0–15.0)
MCHC: 33.4 g/dL (ref 30.0–36.0)
MCV: 90.6 fl (ref 78.0–100.0)
PLATELETS: 303 10*3/uL (ref 150.0–400.0)
RBC: 4.65 Mil/uL (ref 3.87–5.11)
RDW: 13.6 % (ref 11.5–15.5)
WBC: 7.3 10*3/uL (ref 4.0–10.5)

## 2015-12-14 MED ORDER — HYDROCOD POLST-CPM POLST ER 10-8 MG/5ML PO SUER: 5.0000 mL | Freq: Two times a day (BID) | ORAL | Status: DC | PRN

## 2015-12-14 NOTE — Telephone Encounter (Signed)
Patient Name: Anna [name]  DOB: October 26, 1956    Initial Comment caller states she has congestion, shaky and short of breath   Nurse Assessment  Nurse: Mallie Mussel, RN, Alveta Heimlich Date/Time Eilene Ghazi Time): 12/14/2015 9:08:33 AM  Confirm and document reason for call. If symptomatic, describe symptoms. You must click the next button to save text entered. ---Caller states that she had some SOB even at rest. She feels her heart racing when she has the SOB. This began yesterday afternoon. She is speaking in complete sentences. She has been having congestion since the weekend.  Has the patient traveled out of the country within the last 30 days? ---No  Does the patient have any new or worsening symptoms? ---Yes  Will a triage be completed? ---Yes  Related visit to physician within the last 2 weeks? ---No  Does the PT have any chronic conditions? (i.e. diabetes, asthma, etc.) ---Yes  List chronic conditions. ---HTN, Hypothyroidism  Is this a behavioral health or substance abuse call? ---No     Guidelines    Guideline Title Affirmed Question Affirmed Notes  Breathing Difficulty [1] MODERATE difficulty breathing (e.g., speaks in phrases, SOB even at rest, pulse 100-120) AND [2] NEW-onset or WORSE than normal    Final Disposition User   Go to ED Now Mallie Mussel, RN, Alveta Heimlich    Referrals  GO TO FACILITY UNDECIDED   Disagree/Comply: Leta Baptist

## 2015-12-14 NOTE — Patient Instructions (Signed)
I will call with your lab results and chest xray.  Use the cough medication as needed.  Take care  Dr. Lacinda Axon

## 2015-12-14 NOTE — Progress Notes (Signed)
Subjective:  Patient ID: Anna [name], female    DOB: 01-Aug-1956  Age: 60 y.o. MRN: [id number]  CC: Fever, cough, SOB  HPI:  60 year old female presents to clinic today with the above complaints.  Patient states that she was given a flu shot on 1/17. Following her flu shot she developed like symptoms including fever. On Sunday her fever "broke". She then subsequently developed cough and congestion on Monday. She continues to have significant cough which is mildly productive. Additionally, she has now developed shortness of breath. She states that her shortness of breath is intermittent and occurs with both exertion and rest. She is also noted that when she short of breath her heart rate is increased. No fever since this past Sunday. No relieving factors. No medications tried. She is concerned that she may have pneumonia.  Social Hx   Social History   Social History  . Marital Status: Married    Spouse Name: N/A  . Number of Children: 2  . Years of Education: N/A   Social History Main Topics  . Smoking status: Never Smoker   . Smokeless tobacco: Never Used  . Alcohol Use: No  . Drug Use: No  . Sexual Activity: Not Asked   Other Topics Concern  . None   Social History Narrative   Review of Systems  Constitutional: Positive for fever and chills.  HENT: Positive for congestion.   Respiratory: Positive for cough and shortness of breath.   Musculoskeletal: Positive for myalgias.   Objective:  BP 112/62 mmHg  Pulse 86  Temp(Src) 98.1 F (36.7 C) (Oral)  Ht 5' 4.24" (1.632 m)  Wt 183 lb (83.008 kg)  BMI 31.17 kg/m2  SpO2 98%  BP/Weight 12/14/2015 12/05/2015 02/06/2247  Systolic BP 250 037 048  Diastolic BP 62 80 76  Wt. (Lbs) 183 185.5 188.8  BMI 31.17 31.59 32.15    Physical Exam  Constitutional: She is oriented to person, place, and time.  Appears fatigued but in NAD.   HENT:  Head: Normocephalic and atraumatic.  Mouth/Throat: Oropharynx is clear and  moist. No oropharyngeal exudate.  Normal TMs bilaterally.  Cardiovascular: Normal rate and regular rhythm.   No murmur heard. Pulmonary/Chest: Effort normal and breath sounds normal. No respiratory distress. She has no wheezes. She has no rales.  Neurological: She is alert and oriented to person, place, and time.  Vitals reviewed.  Lab Results  Component Value Date   WBC 6.6 05/04/2015   HGB 13.5 05/04/2015   HCT 40.9 05/04/2015   PLT 304.0 05/04/2015   GLUCOSE 90 05/04/2015   CHOL 198 05/04/2015   TRIG 128.0 05/04/2015   HDL 46.90 05/04/2015   LDLDIRECT 175.2 09/07/2013   LDLCALC 126* 05/04/2015   ALT 34 05/04/2015   AST 25 05/04/2015   NA 137 05/04/2015   K 4.4 05/04/2015   CL 102 05/04/2015   CREATININE 0.87 05/04/2015   BUN 14 05/04/2015   CO2 28 05/04/2015   TSH 3.22 05/04/2015   Assessment & Plan:   Problem List Items Addressed This Visit    Shortness of breath    Pulse ox reviewed. Patient not hypoxemic. Patient not short of breath on exam. Lung exam clear. Obtaining chest x-ray.      Cough    New problem. Patient with cough and shortness of breath. Exam unremarkable today. Given history, obtaining chest x-ray to assess for underlying pneumonia. Labs today: CBC, CMP. Treating cough with Tussionex while awaiting results.  Other Visit Diagnoses    SOB (shortness of breath)    -  Primary    Relevant Orders    CBC    Comp Met (CMET)    DG Chest 2 View       Meds ordered this encounter  Medications  . chlorpheniramine-HYDROcodone (TUSSIONEX PENNKINETIC ER) 10-8 MG/5ML SUER    Sig: Take 5 mLs by mouth every 12 (twelve) hours as needed.    Dispense:  115 mL    Refill:  0    Follow-up: PRN  [place]

## 2015-12-14 NOTE — Telephone Encounter (Signed)
Pt was seen today.

## 2015-12-14 NOTE — Assessment & Plan Note (Signed)
Pulse ox reviewed. Patient not hypoxemic. Patient not short of breath on exam. Lung exam clear. Obtaining chest x-ray.

## 2015-12-14 NOTE — Assessment & Plan Note (Signed)
New problem. Patient with cough and shortness of breath. Exam unremarkable today. Given history, obtaining chest x-ray to assess for underlying pneumonia. Labs today: CBC, CMP. Treating cough with Tussionex while awaiting results.

## 2015-12-14 NOTE — Telephone Encounter (Signed)
Spoke with the patient.  Was seen last week by provider, received flu shot.  Saturday had fever, chills, flu like symptoms.  Patients fever broke on Sunday.  Monday had congestion so she stayed home from work.  Went to work on Wednesday and left due to increased congestion, productive cough, yellow/off white sputum.  Patient having palpitations and SOB with exertion.  Patient believes she may have bronchitis or pneumonia.  Wants to just go to an urgent care, advised that we could see in the office instead and scheduled with Dr. Lacinda Axon at [facility].  Please advise.

## 2015-12-15 MED ORDER — PREDNISONE 50 MG PO TABS: ORAL_TABLET | ORAL | Status: DC

## 2015-12-15 NOTE — Addendum Note (Signed)
Addended by: Coral Spikes on: 12/15/2015 01:55 PM   Modules accepted: Orders

## 2015-12-18 ENCOUNTER — Telehealth: Payer: Self-pay

## 2015-12-18 NOTE — Telephone Encounter (Signed)
The cholesterol was not checked on the previous labs.  Can notify pt and she can come in for just cholesterol check.  Will need to notify Hoyle Sauer in lab to only draw cholesterol lab and not draw met b and liver.  (this lab already checked).

## 2015-12-18 NOTE — Telephone Encounter (Signed)
Pt is trying to find out if she should get labs done tomorrow since she had bloodwork done on 12/14/15.

## 2015-12-18 NOTE — Telephone Encounter (Signed)
Pt was notified and verbilized understanding

## 2015-12-19 ENCOUNTER — Other Ambulatory Visit (INDEPENDENT_AMBULATORY_CARE_PROVIDER_SITE_OTHER): Payer: 59

## 2015-12-19 DIAGNOSIS — I10 Essential (primary) hypertension: Secondary | ICD-10-CM

## 2015-12-19 DIAGNOSIS — E7800 Pure hypercholesterolemia, unspecified: Secondary | ICD-10-CM

## 2015-12-19 LAB — LIPID PANEL
CHOLESTEROL: 218 mg/dL — AB (ref 0–200)
HDL: 54.4 mg/dL (ref >39.00)
LDL Cholesterol: 138 mg/dL — ABNORMAL HIGH (ref 0–99)
NonHDL: 163.82
TRIGLYCERIDES: 127 mg/dL (ref 0.0–149.0)
Total CHOL/HDL Ratio: 4
VLDL: 25.4 mg/dL (ref 0.0–40.0)

## 2015-12-20 ENCOUNTER — Ambulatory Visit
Admission: RE | Admit: 2015-12-20 | Discharge: 2015-12-20 | Disposition: A | Discharge status: Home / Self Care | Payer: 59 | Source: Ambulatory Visit | Attending: Internal Medicine | Admitting: Internal Medicine

## 2015-12-20 DIAGNOSIS — Z1231 Encounter for screening mammogram for malignant neoplasm of breast: Secondary | ICD-10-CM | POA: Diagnosis not present

## 2015-12-20 DIAGNOSIS — Z1239 Encounter for other screening for malignant neoplasm of breast: Secondary | ICD-10-CM

## 2015-12-21 ENCOUNTER — Encounter: Payer: Self-pay | Admitting: Internal Medicine

## 2016-01-21 ENCOUNTER — Other Ambulatory Visit: Payer: Self-pay | Admitting: Internal Medicine

## 2016-02-05 ENCOUNTER — Ambulatory Visit: Payer: 59 | Admitting: Internal Medicine

## 2016-02-12 ENCOUNTER — Encounter: Payer: Self-pay | Admitting: Internal Medicine

## 2016-02-24 ENCOUNTER — Other Ambulatory Visit: Payer: Self-pay | Admitting: Internal Medicine

## 2016-04-25 ENCOUNTER — Other Ambulatory Visit: Payer: Self-pay | Admitting: Internal Medicine

## 2016-05-14 ENCOUNTER — Ambulatory Visit (INDEPENDENT_AMBULATORY_CARE_PROVIDER_SITE_OTHER): Payer: 59 | Admitting: Internal Medicine

## 2016-05-14 ENCOUNTER — Encounter: Payer: Self-pay | Admitting: Internal Medicine

## 2016-05-14 VITALS — BP 110/80 | HR 104 | Temp 98.4°F | Resp 18 | Ht 64.24 in | Wt 188.0 lb

## 2016-05-14 DIAGNOSIS — G4733 Obstructive sleep apnea (adult) (pediatric): Secondary | ICD-10-CM

## 2016-05-14 DIAGNOSIS — E7800 Pure hypercholesterolemia, unspecified: Secondary | ICD-10-CM

## 2016-05-14 DIAGNOSIS — R002 Palpitations: Secondary | ICD-10-CM | POA: Diagnosis not present

## 2016-05-14 DIAGNOSIS — K219 Gastro-esophageal reflux disease without esophagitis: Secondary | ICD-10-CM

## 2016-05-14 DIAGNOSIS — E039 Hypothyroidism, unspecified: Secondary | ICD-10-CM

## 2016-05-14 DIAGNOSIS — F329 Major depressive disorder, single episode, unspecified: Secondary | ICD-10-CM

## 2016-05-14 DIAGNOSIS — R0602 Shortness of breath: Secondary | ICD-10-CM

## 2016-05-14 DIAGNOSIS — M778 Other enthesopathies, not elsewhere classified: Secondary | ICD-10-CM

## 2016-05-14 DIAGNOSIS — F32A Depression, unspecified: Secondary | ICD-10-CM

## 2016-05-14 DIAGNOSIS — I10 Essential (primary) hypertension: Secondary | ICD-10-CM | POA: Diagnosis not present

## 2016-05-14 DIAGNOSIS — M659 Synovitis and tenosynovitis, unspecified: Secondary | ICD-10-CM

## 2016-05-14 NOTE — Progress Notes (Signed)
Patient ID: Anna [name], female   DOB: [birth year], 60 y.o.   MRN: [id number]   Subjective:    Patient ID: Anna [name], female    DOB: 07-18-1956, 60 y.o.   MRN: [id number]  HPI  Patient here for a scheduled follow up .   She reports noticing some increased fatigue.  Worse over the last 4-5 weeks.  Some sob with exertion.  Has also noticed some increased palpitations.  Occurs daily.  Has noticed blood pressure has been averaging 145/70-80.  States last pm, did not feel well and blood pressure was 150/98.  After 15 minutes came down to 146/91.  Increased stress.  Husband is doing better.  Increased stress with work.  No chest pain.  No nausea or vomiting.  Eating.  Bowls stable.  Discussed her stress.  She does not feel needs any further intervention at this time.  Increased pain - right elbow.  Worse with rotation.    Past Medical History  Diagnosis Date  . Arthritis   . History of chicken pox   . Diverticulitis     H/O  . Frequent headaches     H/O  . GERD (gastroesophageal reflux disease)   . Hypothyroidism   . Hypercholesterolemia   . Hypertension   . Depression    Past Surgical History  Procedure Laterality Date  . Appendectomy  1981  . Cesarean section  [place]  . Carpal tunnel release  2000  . Tubal ligation  1985  . Eye surgery Bilateral 2008    Lasix eye srg   Family History  Problem Relation Age of Onset  . Hyperlipidemia Mother   . Hypertension Mother   . Alzheimer's disease Mother   . Arthritis Father   . Hyperlipidemia Father   . Heart disease Father     first MI age 69  . Hypertension Father   . Hyperlipidemia Brother   . Heart disease Brother     heart disease dx at a youg age  . Hypertension Brother   . Alzheimer's disease Maternal Aunt   . Hyperlipidemia Maternal Uncle   . Heart disease Maternal Uncle   . Sudden death Maternal Uncle 42    massive heart attack in doctors office  . Arthritis Maternal Grandmother   .  Hyperlipidemia Maternal Grandmother   . Stroke Maternal Grandmother   . Hypertension Maternal Grandmother   . Alzheimer's disease Maternal Grandmother   . Alcohol abuse Maternal Grandfather   . Hypertension Maternal Grandfather   . Diabetes Maternal Grandfather   . Cancer Paternal Grandmother     Ovarian  . Hypertension Paternal Grandmother   . Alcohol abuse Paternal Grandfather   . Hyperlipidemia Paternal Grandfather   . Heart disease Paternal Grandfather   . Hypertension Paternal Grandfather   . Breast cancer Cousin     pat cousin   Social History   Social History  . Marital Status: Married    Spouse Name: N/A  . Number of Children: 2  . Years of Education: N/A   Social History Main Topics  . Smoking status: Never Smoker   . Smokeless tobacco: Never Used  . Alcohol Use: No  . Drug Use: No  . Sexual Activity: Not Asked   Other Topics Concern  . None   Social History Narrative    Outpatient Encounter Prescriptions as of 05/14/2016  Medication Sig  . acyclovir (ZOVIRAX) 400 MG tablet Take one tablet tid for five days prn flares.  Marland Kitchen acyclovir  cream (ZOVIRAX) 5 % Use as directed. (Patient taking differently: as needed. Use as directed.)  . benazepril-hydrochlorthiazide (LOTENSIN HCT) 20-12.5 MG tablet TAKE 1 TABLET DAILY  . buPROPion (WELLBUTRIN XL) 300 MG 24 hr tablet TAKE 1 TABLET DAILY  . EPINEPHrine 0.3 mg/0.3 mL IJ SOAJ injection Inject 0.3 mLs (0.3 mg total) into the muscle as needed (for anaphylaxis).  Marland Kitchen levothyroxine (SYNTHROID, LEVOTHROID) 88 MCG tablet Take 1 tablet (88 mcg total) by mouth daily before breakfast.  . omeprazole (PRILOSEC) 20 MG capsule Take 1 capsule (20 mg total) by mouth 2 (two) times daily before a meal.  . [DISCONTINUED] chlorpheniramine-HYDROcodone (TUSSIONEX PENNKINETIC ER) 10-8 MG/5ML SUER Take 5 mLs by mouth every 12 (twelve) hours as needed.  . [DISCONTINUED] levothyroxine (SYNTHROID, LEVOTHROID) 88 MCG tablet TAKE 1 TABLET DAILY BEFORE  BREAKFAST  . [DISCONTINUED] predniSONE (DELTASONE) 50 MG tablet 1 tablet daily x 5 days.   No facility-administered encounter medications on file as of 05/14/2016.    Review of Systems  Constitutional: Negative for appetite change and unexpected weight change.  HENT: Negative for congestion and sinus pressure.   Respiratory: Positive for shortness of breath (noticed with exertion. ). Negative for cough and chest tightness.   Cardiovascular: Positive for palpitations. Negative for chest pain and leg swelling.  Gastrointestinal: Negative for nausea, vomiting, abdominal pain and diarrhea.  Genitourinary: Negative for dysuria and difficulty urinating.  Musculoskeletal: Negative for back pain and joint swelling.  Skin: Negative for color change and rash.  Neurological: Negative for dizziness, light-headedness and headaches.  Psychiatric/Behavioral: Negative for dysphoric mood and agitation.       Increased stress as outlined.         Objective:     Blood pressure rechecked by me:  128-130/78  Physical Exam  Constitutional: She appears well-developed and well-nourished. No distress.  HENT:  Nose: Nose normal.  Mouth/Throat: Oropharynx is clear and moist.  Neck: Neck supple. No thyromegaly present.  Cardiovascular: Normal rate and regular rhythm.   Pulmonary/Chest: Breath sounds normal. No respiratory distress. She has no wheezes.  Abdominal: Soft. Bowel sounds are normal. There is no tenderness.  Musculoskeletal: She exhibits no edema or tenderness.  Increased pain with rotation of forearm - appears to be c/w tendonitis.    Lymphadenopathy:    She has no cervical adenopathy.  Skin: No rash noted. No erythema.  Psychiatric: She has a normal mood and affect. Her behavior is normal.    BP 110/80 mmHg  Pulse 104  Temp(Src) 98.4 F (36.9 C) (Oral)  Resp 18  Ht 5' 4.24" (1.632 m)  Wt 188 lb (85.276 kg)  BMI 32.02 kg/m2  SpO2 95% Wt Readings from Last 3 Encounters:  05/14/16 188  lb (85.276 kg)  12/14/15 183 lb (83.008 kg)  12/05/15 185 lb 8 oz (84.142 kg)     Lab Results  Component Value Date   WBC 7.3 12/14/2015   HGB 14.1 12/14/2015   HCT 42.1 12/14/2015   PLT 303.0 12/14/2015   GLUCOSE 92 12/14/2015   CHOL 218* 12/19/2015   TRIG 127.0 12/19/2015   HDL 54.40 12/19/2015   LDLDIRECT 175.2 09/07/2013   LDLCALC 138* 12/19/2015   ALT 31 12/14/2015   AST 28 12/14/2015   NA 137 12/14/2015   K 3.9 12/14/2015   CL 100 12/14/2015   CREATININE 0.83 12/14/2015   BUN 13 12/14/2015   CO2 29 12/14/2015   TSH 3.22 05/04/2015    Mm Digital Screening  12/20/2015  CLINICAL DATA:  Screening. EXAM: DIGITAL SCREENING BILATERAL MAMMOGRAM WITH CAD COMPARISON:  Previous exam(s). ACR Breast Density Category b: There are scattered areas of fibroglandular density. FINDINGS: There are no findings suspicious for malignancy. Images were processed with CAD. IMPRESSION: No mammographic evidence of malignancy. A result letter of this screening mammogram will be mailed directly to the patient. RECOMMENDATION: Screening mammogram in one year. (Code:SM-B-01Y) BI-RADS CATEGORY  1: Negative. Electronically Signed   By: Franki Cabot M.D.   On: 12/20/2015 09:56       Assessment & Plan:   Problem List Items Addressed This Visit    Depression    On wellbutrin.  Does not feel needs anything more at this time.  Follow.       Elbow tendonitis    Increased pain, especially with rotation of forearm.  Wear elbow strap.  Request referral to ortho for evaluation.  Avoid increased antiinflammatories.      Relevant Orders   Ambulatory referral to Orthopedic Surgery   Essential hypertension, benign    Blood pressure on my check as outlined.  Her checks have been a little elevated.  She will check her cuff.  Make sure accurate.  Hold on changing medication.  Check metabolic panel.        Relevant Orders   CBC with Differential/Platelet   Basic metabolic panel   GERD (gastroesophageal  reflux disease)    Taking omeprazole twice a day now.  Acid reflux controlled.  Follow.       Hypercholesterolemia    Low cholesterol diet and exercise.  Check lipid panel.       Relevant Orders   Lipid panel   Hepatic function panel   Hypothyroidism    On thyroid replacement.  Check tsh.        Relevant Orders   TSH   Obstructive sleep apnea    Not using her cpap now.  Feels rested when wakes.  Will notify me if desires f/u auto titration.        Palpitations    Occurring daily.  EKG as outlined.  Refer to cardiology as outlined.        Relevant Orders   EKG 12-Lead (Completed)   Ambulatory referral to Cardiology   Shortness of breath    Has noticed some increased sob with exertion.  Some increased palpitations.  Given symptoms, EKG obtained and revealed SR with no acute ischemic changes.  Discussed further w/up with her today.  Refer to cardiology for further evaluation and question of need for holter, echo, stress echo, etc.        Relevant Orders   Ambulatory referral to Cardiology    Other Visit Diagnoses    SOB (shortness of breath)    -  Primary    Relevant Orders    EKG 12-Lead (Completed)      I spent 40 minutes with the patient and more than 50% of the time was spent in consultation regarding the above.     Einar Pheasant, MD

## 2016-05-14 NOTE — Progress Notes (Signed)
Pre-visit discussion using our clinic review tool. No additional management support is needed unless otherwise documented below in the visit note.  

## 2016-05-15 ENCOUNTER — Encounter: Payer: Self-pay | Admitting: Internal Medicine

## 2016-05-15 DIAGNOSIS — R002 Palpitations: Secondary | ICD-10-CM | POA: Insufficient documentation

## 2016-05-15 DIAGNOSIS — M778 Other enthesopathies, not elsewhere classified: Secondary | ICD-10-CM | POA: Insufficient documentation

## 2016-05-15 NOTE — Assessment & Plan Note (Signed)
Low cholesterol diet and exercise.  Check lipid panel.   

## 2016-05-15 NOTE — Assessment & Plan Note (Signed)
Has noticed some increased sob with exertion.  Some increased palpitations.  Given symptoms, EKG obtained and revealed SR with no acute ischemic changes.  Discussed further w/up with her today.  Refer to cardiology for further evaluation and question of need for holter, echo, stress echo, etc.

## 2016-05-15 NOTE — Assessment & Plan Note (Signed)
Taking omeprazole twice a day now.  Acid reflux controlled.  Follow.

## 2016-05-15 NOTE — Assessment & Plan Note (Signed)
Blood pressure on my check as outlined.  Her checks have been a little elevated.  She will check her cuff.  Make sure accurate.  Hold on changing medication.  Check metabolic panel.

## 2016-05-15 NOTE — Assessment & Plan Note (Signed)
Not using her cpap now.  Feels rested when wakes.  Will notify me if desires f/u auto titration.

## 2016-05-15 NOTE — Assessment & Plan Note (Signed)
On thyroid replacement.  Check tsh.  

## 2016-05-15 NOTE — Assessment & Plan Note (Signed)
Increased pain, especially with rotation of forearm.  Wear elbow strap.  Request referral to ortho for evaluation.  Avoid increased antiinflammatories.

## 2016-05-15 NOTE — Assessment & Plan Note (Signed)
Occurring daily.  EKG as outlined.  Refer to cardiology as outlined.

## 2016-05-15 NOTE — Assessment & Plan Note (Signed)
On wellbutrin.  Does not feel needs anything more at this time.  Follow.

## 2016-05-23 ENCOUNTER — Other Ambulatory Visit (INDEPENDENT_AMBULATORY_CARE_PROVIDER_SITE_OTHER): Payer: 59

## 2016-05-23 DIAGNOSIS — E7800 Pure hypercholesterolemia, unspecified: Secondary | ICD-10-CM

## 2016-05-23 DIAGNOSIS — I10 Essential (primary) hypertension: Secondary | ICD-10-CM

## 2016-05-23 DIAGNOSIS — E039 Hypothyroidism, unspecified: Secondary | ICD-10-CM | POA: Diagnosis not present

## 2016-05-23 LAB — HEPATIC FUNCTION PANEL
ALBUMIN: 4 g/dL (ref 3.5–5.2)
ALT: 24 U/L (ref 0–35)
AST: 20 U/L (ref 0–37)
Alkaline Phosphatase: 80 U/L (ref 39–117)
BILIRUBIN TOTAL: 0.5 mg/dL (ref 0.2–1.2)
Bilirubin, Direct: 0.1 mg/dL (ref 0.0–0.3)
Total Protein: 7.1 g/dL (ref 6.0–8.3)

## 2016-05-23 LAB — CBC WITH DIFFERENTIAL/PLATELET
Basophils Absolute: 0 10*3/uL (ref 0.0–0.1)
Basophils Relative: 0.4 % (ref 0.0–3.0)
EOS PCT: 2.5 % (ref 0.0–5.0)
Eosinophils Absolute: 0.2 10*3/uL (ref 0.0–0.7)
HEMATOCRIT: 38.8 % (ref 36.0–46.0)
HEMOGLOBIN: 13 g/dL (ref 12.0–15.0)
LYMPHS PCT: 35.6 % (ref 12.0–46.0)
Lymphs Abs: 2.5 10*3/uL (ref 0.7–4.0)
MCHC: 33.6 g/dL (ref 30.0–36.0)
MCV: 90.2 fl (ref 78.0–100.0)
MONOS PCT: 8.5 % (ref 3.0–12.0)
Monocytes Absolute: 0.6 10*3/uL (ref 0.1–1.0)
Neutro Abs: 3.7 10*3/uL (ref 1.4–7.7)
Neutrophils Relative %: 53 % (ref 43.0–77.0)
Platelets: 303 10*3/uL (ref 150.0–400.0)
RBC: 4.3 Mil/uL (ref 3.87–5.11)
RDW: 14.2 % (ref 11.5–15.5)
WBC: 6.9 10*3/uL (ref 4.0–10.5)

## 2016-05-23 LAB — LIPID PANEL
Cholesterol: 203 mg/dL — ABNORMAL HIGH (ref 0–200)
HDL: 43.1 mg/dL (ref >39.00)
NONHDL: 160.27
TRIGLYCERIDES: 214 mg/dL — AB (ref 0.0–149.0)
Total CHOL/HDL Ratio: 5
VLDL: 42.8 mg/dL — AB (ref 0.0–40.0)

## 2016-05-23 LAB — BASIC METABOLIC PANEL
BUN: 17 mg/dL (ref 6–23)
CALCIUM: 9.2 mg/dL (ref 8.4–10.5)
CHLORIDE: 103 meq/L (ref 96–112)
CO2: 26 mEq/L (ref 19–32)
CREATININE: 0.92 mg/dL (ref 0.40–1.20)
GFR: 66.31 mL/min (ref >60.00)
Glucose, Bld: 96 mg/dL (ref 70–99)
Potassium: 4 mEq/L (ref 3.5–5.1)
SODIUM: 139 meq/L (ref 135–145)

## 2016-05-23 LAB — LDL CHOLESTEROL, DIRECT: LDL DIRECT: 143 mg/dL

## 2016-05-23 LAB — TSH: TSH: 4.16 u[IU]/mL (ref 0.35–4.50)

## 2016-05-24 ENCOUNTER — Encounter: Payer: Self-pay | Admitting: Internal Medicine

## 2016-05-28 MED ORDER — ROSUVASTATIN CALCIUM 5 MG PO TABS: 5.0000 mg | ORAL_TABLET | Freq: Every day | ORAL | Status: DC

## 2016-05-28 NOTE — Telephone Encounter (Signed)
Unread mychart message mailed to patient 

## 2016-05-28 NOTE — Addendum Note (Signed)
Addended by: Alisa Graff on: 05/28/2016 11:08 PM   Modules accepted: Orders

## 2016-05-28 NOTE — Telephone Encounter (Signed)
rx sent in for crestor 5mg  #90 with no refills.

## 2016-06-09 ENCOUNTER — Other Ambulatory Visit: Payer: Self-pay | Admitting: Internal Medicine

## 2016-07-08 ENCOUNTER — Encounter: Payer: Self-pay | Admitting: Internal Medicine

## 2016-07-10 ENCOUNTER — Encounter: Payer: Self-pay | Admitting: Internal Medicine

## 2016-07-10 ENCOUNTER — Ambulatory Visit (INDEPENDENT_AMBULATORY_CARE_PROVIDER_SITE_OTHER): Payer: 59 | Admitting: Internal Medicine

## 2016-07-10 VITALS — BP 110/70 | HR 84 | Temp 98.0°F | Resp 18 | Ht 64.24 in | Wt 189.5 lb

## 2016-07-10 DIAGNOSIS — E7800 Pure hypercholesterolemia, unspecified: Secondary | ICD-10-CM | POA: Diagnosis not present

## 2016-07-10 DIAGNOSIS — M659 Synovitis and tenosynovitis, unspecified: Secondary | ICD-10-CM

## 2016-07-10 DIAGNOSIS — K219 Gastro-esophageal reflux disease without esophagitis: Secondary | ICD-10-CM

## 2016-07-10 DIAGNOSIS — R87619 Unspecified abnormal cytological findings in specimens from cervix uteri: Secondary | ICD-10-CM

## 2016-07-10 DIAGNOSIS — M778 Other enthesopathies, not elsewhere classified: Secondary | ICD-10-CM

## 2016-07-10 DIAGNOSIS — R0602 Shortness of breath: Secondary | ICD-10-CM

## 2016-07-10 DIAGNOSIS — R002 Palpitations: Secondary | ICD-10-CM

## 2016-07-10 DIAGNOSIS — Z23 Encounter for immunization: Secondary | ICD-10-CM

## 2016-07-10 DIAGNOSIS — I10 Essential (primary) hypertension: Secondary | ICD-10-CM

## 2016-07-10 DIAGNOSIS — E039 Hypothyroidism, unspecified: Secondary | ICD-10-CM

## 2016-07-10 LAB — HEPATIC FUNCTION PANEL
ALK PHOS: 73 U/L (ref 39–117)
ALT: 30 U/L (ref 0–35)
AST: 26 U/L (ref 0–37)
Albumin: 4.2 g/dL (ref 3.5–5.2)
BILIRUBIN DIRECT: 0.1 mg/dL (ref 0.0–0.3)
BILIRUBIN TOTAL: 0.4 mg/dL (ref 0.2–1.2)
TOTAL PROTEIN: 7.2 g/dL (ref 6.0–8.3)

## 2016-07-10 NOTE — Progress Notes (Signed)
Pre-visit discussion using our clinic review tool. No additional management support is needed unless otherwise documented below in the visit note.  

## 2016-07-10 NOTE — Patient Instructions (Signed)

## 2016-07-10 NOTE — Progress Notes (Signed)
Patient ID: Anna [name], female   DOB: Feb 25, 1956, 60 y.o.   MRN: [id number]   Subjective:    Patient ID: Anna [name], female    DOB: Nov 20, 1955, 60 y.o.   MRN: [id number]  HPI  Patient here for a scheduled follow up.  She saw ortho for her elbow pain.  S/p injection.  Injection helped for a while.  Was given mobic.  Discussed risk and possible side effects of medication.  Continues to f/u with ortho.  Still with increased stress.  Husband is doing some better.  Overall she feels she is handling stress relatively well.  Does not feel needs any further intervention.  Previous experiencing increased sob with exertion and increased palpitations.  See last note.  Symptoms have not worsened.  May be a little better.  Had referred her to Dr [place] at her request.  appt scheduled 09/2016.  She has talked with them about earlier appt.  On cancellation list.  Is comfortable waiting until 09/2016.  States occasionally will notice "flip flop" of her heart.   Symptoms overall improved.  She is eating and drinking well.  No nausea or vomiting.  Bowels stable.  Discussed diet.     Past Medical History:  Diagnosis Date  . Arthritis   . Depression   . Diverticulitis    H/O  . Frequent headaches    H/O  . GERD (gastroesophageal reflux disease)   . History of chicken pox   . Hypercholesterolemia   . Hypertension   . Hypothyroidism    Past Surgical History:  Procedure Laterality Date  . APPENDECTOMY  1981  . CARPAL TUNNEL RELEASE  2000  . [place]  . EYE SURGERY Bilateral 2008   Lasix eye srg  . TUBAL LIGATION  1985   Family History  Problem Relation Age of Onset  . Hyperlipidemia Mother   . Hypertension Mother   . Alzheimer's disease Mother   . Arthritis Father   . Hyperlipidemia Father   . Heart disease Father     first MI age 81  . Hypertension Father   . Hyperlipidemia Brother   . Heart disease Brother     heart disease dx at a youg age  .  Hypertension Brother   . Alzheimer's disease Maternal Aunt   . Hyperlipidemia Maternal Uncle   . Heart disease Maternal Uncle   . Sudden death Maternal Uncle 42    massive heart attack in doctors office  . Arthritis Maternal Grandmother   . Hyperlipidemia Maternal Grandmother   . Stroke Maternal Grandmother   . Hypertension Maternal Grandmother   . Alzheimer's disease Maternal Grandmother   . Alcohol abuse Maternal Grandfather   . Hypertension Maternal Grandfather   . Diabetes Maternal Grandfather   . Cancer Paternal Grandmother     Ovarian  . Hypertension Paternal Grandmother   . Alcohol abuse Paternal Grandfather   . Hyperlipidemia Paternal Grandfather   . Heart disease Paternal Grandfather   . Hypertension Paternal Grandfather   . Breast cancer Cousin     pat cousin   Social History   Social History  . Marital status: Married    Spouse name: N/A  . Number of children: 2  . Years of education: N/A   Social History Main Topics  . Smoking status: Never Smoker  . Smokeless tobacco: Never Used  . Alcohol use No  . Drug use: No  . Sexual activity: Not Asked   Other Topics Concern  .  None   Social History Narrative  . None    Outpatient Encounter Prescriptions as of 07/10/2016  Medication Sig  . acyclovir (ZOVIRAX) 400 MG tablet Take one tablet tid for five days prn flares.  Marland Kitchen acyclovir cream (ZOVIRAX) 5 % Use as directed. (Patient taking differently: as needed. Use as directed.)  . benazepril-hydrochlorthiazide (LOTENSIN HCT) 20-12.5 MG tablet TAKE 1 TABLET DAILY  . buPROPion (WELLBUTRIN XL) 300 MG 24 hr tablet TAKE 1 TABLET DAILY  . EPINEPHrine 0.3 mg/0.3 mL IJ SOAJ injection Inject 0.3 mLs (0.3 mg total) into the muscle as needed (for anaphylaxis).  Marland Kitchen levothyroxine (SYNTHROID, LEVOTHROID) 88 MCG tablet Take 1 tablet (88 mcg total) by mouth daily before breakfast.  . meloxicam (MOBIC) 7.5 MG tablet Take 7.5 mg by mouth daily.  Marland Kitchen omeprazole (PRILOSEC) 20 MG capsule  TAKE 1 CAPSULE TWICE A DAY BEFORE MEALS  . rosuvastatin (CRESTOR) 5 MG tablet Take 1 tablet (5 mg total) by mouth daily.   No facility-administered encounter medications on file as of 07/10/2016.     Review of Systems  Constitutional: Negative for appetite change and unexpected weight change.  HENT: Negative for congestion and sinus pressure.   Respiratory: Negative for cough and chest tightness.        Previous sob with exertion.   Cardiovascular: Positive for palpitations. Negative for chest pain and leg swelling.  Gastrointestinal: Negative for abdominal pain, diarrhea, nausea and vomiting.  Genitourinary: Negative for difficulty urinating and dysuria.  Musculoskeletal: Negative for back pain.       Elbow pain as outlined.  (right).    Skin: Negative for color change and rash.  Neurological: Negative for dizziness, light-headedness and headaches.  Psychiatric/Behavioral: Negative for agitation and dysphoric mood.       Objective:    Physical Exam  Constitutional: She appears well-developed and well-nourished. No distress.  HENT:  Nose: Nose normal.  Mouth/Throat: Oropharynx is clear and moist.  Neck: Neck supple. No thyromegaly present.  Cardiovascular: Normal rate and regular rhythm.   Pulmonary/Chest: Breath sounds normal. No respiratory distress. She has no wheezes.  Abdominal: Soft. Bowel sounds are normal. There is no tenderness.  Musculoskeletal: She exhibits no edema or tenderness.  Lymphadenopathy:    She has no cervical adenopathy.  Skin: No rash noted. No erythema.  Psychiatric: She has a normal mood and affect. Her behavior is normal.    BP 110/70   Pulse 84   Temp 98 F (36.7 C) (Oral)   Resp 18   Ht 5' 4.24" (1.632 m)   Wt 189 lb 8 oz (86 kg)   SpO2 96%   BMI 32.29 kg/m  Wt Readings from Last 3 Encounters:  07/10/16 189 lb 8 oz (86 kg)  05/14/16 188 lb (85.3 kg)  12/14/15 183 lb (83 kg)     Lab Results  Component Value Date   WBC 6.9  05/23/2016   HGB 13.0 05/23/2016   HCT 38.8 05/23/2016   PLT 303.0 05/23/2016   GLUCOSE 96 05/23/2016   CHOL 203 (H) 05/23/2016   TRIG 214.0 (H) 05/23/2016   HDL 43.10 05/23/2016   LDLDIRECT 143.0 05/23/2016   LDLCALC 138 (H) 12/19/2015   ALT 30 07/10/2016   AST 26 07/10/2016   NA 139 05/23/2016   K 4.0 05/23/2016   CL 103 05/23/2016   CREATININE 0.92 05/23/2016   BUN 17 05/23/2016   CO2 26 05/23/2016   TSH 4.16 05/23/2016    Mm Digital Screening  Result Date: 12/20/2015  CLINICAL DATA:  Screening. EXAM: DIGITAL SCREENING BILATERAL MAMMOGRAM WITH CAD COMPARISON:  Previous exam(s). ACR Breast Density Category b: There are scattered areas of fibroglandular density. FINDINGS: There are no findings suspicious for malignancy. Images were processed with CAD. IMPRESSION: No mammographic evidence of malignancy. A result letter of this screening mammogram will be mailed directly to the patient. RECOMMENDATION: Screening mammogram in one year. (Code:SM-B-01Y) BI-RADS CATEGORY  1: Negative. Electronically Signed   By: Franki Cabot M.D.   On: 12/20/2015 09:56       Assessment & Plan:   Problem List Items Addressed This Visit    Abnormal Pap smear of cervix    PAP 05/04/15 - negative with negative HPV.       Elbow tendonitis    Seeing ortho.  S/p injection.  Discussed risk and possible side effects of mobic.  Follow.       Essential hypertension, benign    Blood pressure appears to be doing well.  Follow.  Continue same medication regimen.  Follow.        Relevant Orders   Basic metabolic panel   GERD (gastroesophageal reflux disease)    On omeprazole.  Symptoms controlled.  Discussed side effects of mobic.  Follow.       Hypercholesterolemia - Primary    Low cholesterol diet and exercise.  On crestor now.  Check liver panel.  Follow lipid panel and liver function tests.        Relevant Orders   Hepatic function panel (Completed)   Lipid panel   Hepatic function panel    Hypothyroidism    On thyroid replacement.  Follow tsh.        Palpitations    Symptoms as outlined.  Have decreased.  Planned f/u with cardiology as outlined.        Shortness of breath    Previously noticed some sob with exertion.  See last note.  Has improved.  Planning to see cardiology as outlined.  Will notify me or be reevaluated if symptoms worsen or change.         Other Visit Diagnoses    Encounter for immunization       Relevant Medications   meloxicam (MOBIC) 7.5 MG tablet   Other Relevant Orders   Flu Vaccine QUAD 36+ mos IM (Completed)   Hepatic function panel (Completed)       Einar Pheasant, MD

## 2016-07-11 ENCOUNTER — Encounter: Payer: Self-pay | Admitting: Internal Medicine

## 2016-07-15 ENCOUNTER — Encounter: Payer: Self-pay | Admitting: Internal Medicine

## 2016-07-15 NOTE — Assessment & Plan Note (Signed)
PAP 05/04/15 - negative with negative HPV.

## 2016-07-15 NOTE — Assessment & Plan Note (Signed)
Symptoms as outlined.  Have decreased.  Planned f/u with cardiology as outlined.

## 2016-07-15 NOTE — Assessment & Plan Note (Signed)
Low cholesterol diet and exercise.  On crestor now.  Check liver panel.  Follow lipid panel and liver function tests.

## 2016-07-15 NOTE — Assessment & Plan Note (Signed)
Seeing ortho.  S/p injection.  Discussed risk and possible side effects of mobic.  Follow.

## 2016-07-15 NOTE — Assessment & Plan Note (Signed)
Previously noticed some sob with exertion.  See last note.  Has improved.  Planning to see cardiology as outlined.  Will notify me or be reevaluated if symptoms worsen or change.

## 2016-07-15 NOTE — Assessment & Plan Note (Signed)
On thyroid replacement.  Follow tsh.  

## 2016-07-15 NOTE — Assessment & Plan Note (Signed)
On omeprazole.  Symptoms controlled.  Discussed side effects of mobic.  Follow.

## 2016-07-15 NOTE — Assessment & Plan Note (Signed)
Blood pressure appears to be doing well.  Follow.  Continue same medication regimen.  Follow.

## 2016-07-24 ENCOUNTER — Other Ambulatory Visit: Payer: Self-pay | Admitting: Internal Medicine

## 2016-08-09 ENCOUNTER — Other Ambulatory Visit: Payer: Self-pay | Admitting: Internal Medicine

## 2016-08-24 ENCOUNTER — Other Ambulatory Visit: Payer: Self-pay | Admitting: Internal Medicine

## 2016-09-24 ENCOUNTER — Encounter: Payer: Self-pay | Admitting: Internal Medicine

## 2016-09-25 ENCOUNTER — Other Ambulatory Visit: Payer: Self-pay

## 2016-09-25 MED ORDER — BUPROPION HCL ER (XL) 300 MG PO TB24: 300.0000 mg | ORAL_TABLET | Freq: Every day | ORAL | 1 refills | Status: DC

## 2016-10-07 ENCOUNTER — Other Ambulatory Visit (INDEPENDENT_AMBULATORY_CARE_PROVIDER_SITE_OTHER): Payer: 59

## 2016-10-07 DIAGNOSIS — I10 Essential (primary) hypertension: Secondary | ICD-10-CM | POA: Diagnosis not present

## 2016-10-07 DIAGNOSIS — E7800 Pure hypercholesterolemia, unspecified: Secondary | ICD-10-CM | POA: Diagnosis not present

## 2016-10-07 LAB — LIPID PANEL
CHOLESTEROL: 233 mg/dL — AB (ref 0–200)
HDL: 48.5 mg/dL (ref >39.00)
LDL Cholesterol: 148 mg/dL — ABNORMAL HIGH (ref 0–99)
NonHDL: 184.18
TRIGLYCERIDES: 179 mg/dL — AB (ref 0.0–149.0)
Total CHOL/HDL Ratio: 5
VLDL: 35.8 mg/dL (ref 0.0–40.0)

## 2016-10-07 LAB — BASIC METABOLIC PANEL
BUN: 19 mg/dL (ref 6–23)
CALCIUM: 9.5 mg/dL (ref 8.4–10.5)
CO2: 26 mEq/L (ref 19–32)
CREATININE: 0.97 mg/dL (ref 0.40–1.20)
Chloride: 104 mEq/L (ref 96–112)
GFR: 62.31 mL/min (ref >60.00)
Glucose, Bld: 100 mg/dL — ABNORMAL HIGH (ref 70–99)
Potassium: 4.1 mEq/L (ref 3.5–5.1)
Sodium: 140 mEq/L (ref 135–145)

## 2016-10-07 LAB — HEPATIC FUNCTION PANEL
ALK PHOS: 68 U/L (ref 39–117)
ALT: 27 U/L (ref 0–35)
AST: 21 U/L (ref 0–37)
Albumin: 4.3 g/dL (ref 3.5–5.2)
BILIRUBIN DIRECT: 0.1 mg/dL (ref 0.0–0.3)
TOTAL PROTEIN: 7.4 g/dL (ref 6.0–8.3)
Total Bilirubin: 0.5 mg/dL (ref 0.2–1.2)

## 2016-10-09 ENCOUNTER — Encounter: Payer: Self-pay | Admitting: Internal Medicine

## 2016-10-14 ENCOUNTER — Encounter: Payer: Self-pay | Admitting: Internal Medicine

## 2016-10-14 ENCOUNTER — Ambulatory Visit (INDEPENDENT_AMBULATORY_CARE_PROVIDER_SITE_OTHER): Payer: 59 | Admitting: Internal Medicine

## 2016-10-14 VITALS — BP 100/60 | HR 79 | Temp 97.8°F | Ht 64.0 in | Wt 183.8 lb

## 2016-10-14 DIAGNOSIS — I10 Essential (primary) hypertension: Secondary | ICD-10-CM

## 2016-10-14 DIAGNOSIS — M778 Other enthesopathies, not elsewhere classified: Secondary | ICD-10-CM

## 2016-10-14 DIAGNOSIS — E039 Hypothyroidism, unspecified: Secondary | ICD-10-CM

## 2016-10-14 DIAGNOSIS — Z0000 Encounter for general adult medical examination without abnormal findings: Secondary | ICD-10-CM | POA: Diagnosis not present

## 2016-10-14 DIAGNOSIS — E7800 Pure hypercholesterolemia, unspecified: Secondary | ICD-10-CM | POA: Diagnosis not present

## 2016-10-14 DIAGNOSIS — K219 Gastro-esophageal reflux disease without esophagitis: Secondary | ICD-10-CM

## 2016-10-14 MED ORDER — ACYCLOVIR 400 MG PO TABS: ORAL_TABLET | ORAL | 0 refills | Status: DC

## 2016-10-14 MED ORDER — BUPROPION HCL ER (XL) 300 MG PO TB24: 300.0000 mg | ORAL_TABLET | Freq: Every day | ORAL | 1 refills | Status: DC

## 2016-10-14 MED ORDER — ROSUVASTATIN CALCIUM 5 MG PO TABS: 5.0000 mg | ORAL_TABLET | Freq: Every day | ORAL | 1 refills | Status: DC

## 2016-10-14 NOTE — Progress Notes (Signed)
Pre visit review using our clinic review tool, if applicable. No additional management support is needed unless otherwise documented below in the visit note. 

## 2016-10-14 NOTE — Progress Notes (Signed)
Patient ID: Anna [name], female   DOB: [birth year], 60 y.o.   MRN: [id number]   Subjective:    Patient ID: Anna [name], female    DOB: 30-Mar-1956, 60 y.o.   MRN: [id number]  HPI  Patient here for her physical exam.  Recently retired.  States was a forced retirement, but she is doing relatively well with this.  Physically feels better.  No sob.  No increased heart rate or palpitations.  Her husband is doing better.  Plans for f/u with oncology in 10/2016.  Increased stress with her nephew's medical issues.  Overall she feels she is handling things relatively well.  Discussed recent labs.  Discussed diet and exercise.  No chest pain. No sob.  No acid reflux.  No abdominal pain or cramping.  Bowels stable.     Past Medical History:  Diagnosis Date  . Arthritis   . Depression   . Diverticulitis    H/O  . Frequent headaches    H/O  . GERD (gastroesophageal reflux disease)   . History of chicken pox   . Hypercholesterolemia   . Hypertension   . Hypothyroidism    Past Surgical History:  Procedure Laterality Date  . APPENDECTOMY  1981  . CARPAL TUNNEL RELEASE  2000  . [place]  . EYE SURGERY Bilateral 2008   Lasix eye srg  . TUBAL LIGATION  1985   Family History  Problem Relation Age of Onset  . Hyperlipidemia Mother   . Hypertension Mother   . Alzheimer's disease Mother   . Arthritis Father   . Hyperlipidemia Father   . Heart disease Father     first MI age 63  . Hypertension Father   . Hyperlipidemia Brother   . Heart disease Brother     heart disease dx at a youg age  . Hypertension Brother   . Alzheimer's disease Maternal Aunt   . Hyperlipidemia Maternal Uncle   . Heart disease Maternal Uncle   . Sudden death Maternal Uncle 42    massive heart attack in doctors office  . Arthritis Maternal Grandmother   . Hyperlipidemia Maternal Grandmother   . Stroke Maternal Grandmother   . Hypertension Maternal Grandmother   . Alzheimer's  disease Maternal Grandmother   . Alcohol abuse Maternal Grandfather   . Hypertension Maternal Grandfather   . Diabetes Maternal Grandfather   . Cancer Paternal Grandmother     Ovarian  . Hypertension Paternal Grandmother   . Alcohol abuse Paternal Grandfather   . Hyperlipidemia Paternal Grandfather   . Heart disease Paternal Grandfather   . Hypertension Paternal Grandfather   . Breast cancer Cousin     pat cousin   Social History   Social History  . Marital status: Married    Spouse name: N/A  . Number of children: 2  . Years of education: N/A   Social History Main Topics  . Smoking status: Never Smoker  . Smokeless tobacco: Never Used  . Alcohol use No  . Drug use: No  . Sexual activity: Not Asked   Other Topics Concern  . None   Social History Narrative  . None    Outpatient Encounter Prescriptions as of 10/14/2016  Medication Sig  . acyclovir (ZOVIRAX) 400 MG tablet Take one tablet tid for five days prn flares.  Marland Kitchen acyclovir cream (ZOVIRAX) 5 % Use as directed. (Patient taking differently: as needed. Use as directed.)  . benazepril-hydrochlorthiazide (LOTENSIN HCT) 20-12.5 MG tablet TAKE  1 TABLET DAILY  . buPROPion (WELLBUTRIN XL) 300 MG 24 hr tablet Take 1 tablet (300 mg total) by mouth daily.  Marland Kitchen EPINEPHrine 0.3 mg/0.3 mL IJ SOAJ injection Inject 0.3 mLs (0.3 mg total) into the muscle as needed (for anaphylaxis).  Marland Kitchen levothyroxine (SYNTHROID, LEVOTHROID) 88 MCG tablet TAKE 1 TABLET DAILY BEFORE BREAKFAST  . omeprazole (PRILOSEC) 20 MG capsule TAKE 1 CAPSULE TWICE A DAY BEFORE MEALS  . rosuvastatin (CRESTOR) 5 MG tablet Take 1 tablet (5 mg total) by mouth daily.  . [DISCONTINUED] acyclovir (ZOVIRAX) 400 MG tablet Take one tablet tid for five days prn flares.  . [DISCONTINUED] buPROPion (WELLBUTRIN XL) 300 MG 24 hr tablet Take 1 tablet (300 mg total) by mouth daily.  . [DISCONTINUED] rosuvastatin (CRESTOR) 5 MG tablet TAKE 1 TABLET DAILY  . [DISCONTINUED] meloxicam  (MOBIC) 7.5 MG tablet Take 7.5 mg by mouth daily.   No facility-administered encounter medications on file as of 10/14/2016.     Review of Systems  Constitutional: Negative for appetite change and unexpected weight change.  HENT: Negative for congestion and sinus pressure.   Eyes: Negative for pain and visual disturbance.  Respiratory: Negative for cough, chest tightness and shortness of breath.   Cardiovascular: Negative for chest pain, palpitations and leg swelling.  Gastrointestinal: Negative for abdominal pain, constipation and diarrhea.  Genitourinary: Negative for difficulty urinating and dysuria.  Musculoskeletal: Negative for back pain and joint swelling.  Skin: Negative for color change and rash.  Neurological: Negative for dizziness, light-headedness and headaches.  Hematological: Negative for adenopathy. Does not bruise/bleed easily.  Psychiatric/Behavioral: Negative for agitation and dysphoric mood.       Objective:    Physical Exam  Constitutional: She is oriented to person, place, and time. She appears well-developed and well-nourished. No distress.  HENT:  Nose: Nose normal.  Mouth/Throat: Oropharynx is clear and moist.  Eyes: Right eye exhibits no discharge. Left eye exhibits no discharge. No scleral icterus.  Neck: Neck supple. No thyromegaly present.  Cardiovascular: Normal rate and regular rhythm.   Pulmonary/Chest: Breath sounds normal. No accessory muscle usage. No tachypnea. No respiratory distress. She has no decreased breath sounds. She has no wheezes. She has no rhonchi. Right breast exhibits no inverted nipple, no mass, no nipple discharge and no tenderness (no axillary adenopathy). Left breast exhibits no inverted nipple, no mass, no nipple discharge and no tenderness (no axilarry adenopathy).  Abdominal: Soft. Bowel sounds are normal. There is no tenderness.  Musculoskeletal: She exhibits no edema or tenderness.  Lymphadenopathy:    She has no cervical  adenopathy.  Neurological: She is alert and oriented to person, place, and time.  Skin: Skin is warm. No rash noted. No erythema.  Psychiatric: She has a normal mood and affect. Her behavior is normal.    BP 100/60   Pulse 79   Temp 97.8 F (36.6 C) (Oral)   Ht 5\' 4"  (1.626 m)   Wt 183 lb 12.8 oz (83.4 kg)   SpO2 96%   BMI 31.55 kg/m  Wt Readings from Last 3 Encounters:  10/14/16 183 lb 12.8 oz (83.4 kg)  07/10/16 189 lb 8 oz (86 kg)  05/14/16 188 lb (85.3 kg)     Lab Results  Component Value Date   WBC 6.9 05/23/2016   HGB 13.0 05/23/2016   HCT 38.8 05/23/2016   PLT 303.0 05/23/2016   GLUCOSE 100 (H) 10/07/2016   CHOL 233 (H) 10/07/2016   TRIG 179.0 (H) 10/07/2016   HDL  48.50 10/07/2016   LDLDIRECT 143.0 05/23/2016   LDLCALC 148 (H) 10/07/2016   ALT 27 10/07/2016   AST 21 10/07/2016   NA 140 10/07/2016   K 4.1 10/07/2016   CL 104 10/07/2016   CREATININE 0.97 10/07/2016   BUN 19 10/07/2016   CO2 26 10/07/2016   TSH 4.16 05/23/2016    Mm Digital Screening  Result Date: 12/20/2015 CLINICAL DATA:  Screening. EXAM: DIGITAL SCREENING BILATERAL MAMMOGRAM WITH CAD COMPARISON:  Previous exam(s). ACR Breast Density Category b: There are scattered areas of fibroglandular density. FINDINGS: There are no findings suspicious for malignancy. Images were processed with CAD. IMPRESSION: No mammographic evidence of malignancy. A result letter of this screening mammogram will be mailed directly to the patient. RECOMMENDATION: Screening mammogram in one year. (Code:SM-B-01Y) BI-RADS CATEGORY  1: Negative. Electronically Signed   By: Franki Cabot M.D.   On: 12/20/2015 09:56       Assessment & Plan:   Problem List Items Addressed This Visit    Elbow tendonitis    S/p injection.  Doing well.  Follow.        Essential hypertension, benign    Blood pressure on recheck 112/7.  Continue medication regimen.  Follow pressures.  Follow metabolic panel.        Relevant Medications    rosuvastatin (CRESTOR) 5 MG tablet   Other Relevant Orders   Basic metabolic panel   GERD (gastroesophageal reflux disease)    On omeprazole.  Symptoms controlled.        Health care maintenance    Physical today 10/14/16.  PAP 05/04/15 - negative with negative HPV.  Mammogram 12/20/15 - Bir[facility] I.  Colonoscopy 03/25/14 - normal.  Recommended f/u colonoscopy in five years.        Hypercholesterolemia    Low cholesterol diet and exercise.  Follow lipid panel and liver function tests.  On crestor.        Relevant Medications   rosuvastatin (CRESTOR) 5 MG tablet   Other Relevant Orders   Hepatic function panel   Lipid panel   Hypothyroidism    On thyroid replacement.  Follow tsh.         Other Visit Diagnoses    Routine general medical examination at a health care facility    -  Primary       Einar Pheasant, MD

## 2016-10-14 NOTE — Assessment & Plan Note (Signed)
Physical today 10/14/16.  PAP 05/04/15 - negative with negative HPV.  Mammogram 12/20/15 - Birads I.  Colonoscopy 03/25/14 - normal.  Recommended f/u colonoscopy in five years.

## 2016-10-20 ENCOUNTER — Encounter: Payer: Self-pay | Admitting: Internal Medicine

## 2016-10-20 NOTE — Assessment & Plan Note (Signed)
On omeprazole.  Symptoms controlled.  

## 2016-10-20 NOTE — Assessment & Plan Note (Signed)
Blood pressure on recheck 112/7.  Continue medication regimen.  Follow pressures.  Follow metabolic panel.

## 2016-10-20 NOTE — Assessment & Plan Note (Signed)
S/p injection.  Doing well.  Follow.

## 2016-10-20 NOTE — Assessment & Plan Note (Signed)
On thyroid replacement.  Follow tsh.  

## 2016-10-20 NOTE — Assessment & Plan Note (Signed)
Low cholesterol diet and exercise.  Follow lipid panel and liver function tests.  On crestor.   

## 2016-10-22 ENCOUNTER — Ambulatory Visit: Payer: 59 | Admitting: Cardiology

## 2016-10-22 ENCOUNTER — Other Ambulatory Visit: Payer: Self-pay | Admitting: Internal Medicine

## 2016-11-18 DIAGNOSIS — C5702 Malignant neoplasm of left fallopian tube: Secondary | ICD-10-CM

## 2016-11-18 HISTORY — DX: Malignant neoplasm of left fallopian tube: C57.02

## 2016-12-15 ENCOUNTER — Other Ambulatory Visit: Payer: Self-pay | Admitting: Internal Medicine

## 2016-12-29 ENCOUNTER — Encounter: Payer: Self-pay | Admitting: Internal Medicine

## 2016-12-30 NOTE — Telephone Encounter (Signed)
See attached message.  I had put that I can see her at 9:00 tomorrow.  I sent this originally to [place].  It was sent back to me.  Not sure if she can see.  I am going to send back to her and to you.  Please verify pt gets called.  It appears the 9:00 spot has been taken.  Please schedule her for 12:15.  Thanks

## 2016-12-30 NOTE — Telephone Encounter (Signed)
I can see her at 9:00 tomorrow 12/31/16.

## 2016-12-30 NOTE — Telephone Encounter (Signed)
Spoke with patient she is coming in tomorrow at 12:15pm for appointment .  Will get Caryl Pina to place on schedule.   Thanks

## 2016-12-31 ENCOUNTER — Encounter: Payer: Self-pay | Admitting: Internal Medicine

## 2016-12-31 ENCOUNTER — Ambulatory Visit (INDEPENDENT_AMBULATORY_CARE_PROVIDER_SITE_OTHER): Payer: 59 | Admitting: Internal Medicine

## 2016-12-31 DIAGNOSIS — R05 Cough: Secondary | ICD-10-CM

## 2016-12-31 DIAGNOSIS — R059 Cough, unspecified: Secondary | ICD-10-CM

## 2016-12-31 MED ORDER — AMOXICILLIN 875 MG PO TABS: 875.0000 mg | ORAL_TABLET | Freq: Two times a day (BID) | ORAL | 0 refills | Status: DC

## 2016-12-31 MED ORDER — PREDNISONE 10 MG PO TABS: ORAL_TABLET | ORAL | 0 refills | Status: DC

## 2016-12-31 NOTE — Patient Instructions (Signed)
Saline nasal spray - flush nose at least 2-3x/day  nasacort nasal spray - 2 sprays each nostril one time per day.  Do this in the evening.    Take a probiotic daily while you are on the antibiotic and for two weeks after completing the antibiotic.

## 2016-12-31 NOTE — Progress Notes (Signed)
Patient ID: [name], female   DOB: 02-21-1956, 61 y.o.   MRN: [id number]   Subjective:    Patient ID: Jaleya Fatima Blank, female    DOB: Jan 18, 1956, 61 y.o.   MRN: [id number]  HPI  Patient here as a work in appt with concerns regarding cough and congestion.  She reports symptoms started over one week ago.  Reports no fever or chills.  Increased nasal congestion and increased drainage.  Yellow/green mucus production.  Cough.  No vomiting.  No diarrhea.  Taking Dayquil and Nyquil.     Past Medical History:  Diagnosis Date  . Arthritis   . Depression   . Diverticulitis    H/O  . Frequent headaches    H/O  . GERD (gastroesophageal reflux disease)   . History of chicken pox   . Hypercholesterolemia   . Hypertension   . Hypothyroidism    Past Surgical History:  Procedure Laterality Date  . APPENDECTOMY  1981  . CARPAL TUNNEL RELEASE  2000  . [place]  . EYE SURGERY Bilateral 2008   Lasix eye srg  . TUBAL LIGATION  1985   Family History  Problem Relation Age of Onset  . Hyperlipidemia Mother   . Hypertension Mother   . Alzheimer's disease Mother   . Arthritis Father   . Hyperlipidemia Father   . Heart disease Father     first MI age 45  . Hypertension Father   . Hyperlipidemia Brother   . Heart disease Brother     heart disease dx at a youg age  . Hypertension Brother   . Alzheimer's disease Maternal Aunt   . Hyperlipidemia Maternal Uncle   . Heart disease Maternal Uncle   . Sudden death Maternal Uncle 42    massive heart attack in doctors office  . Arthritis Maternal Grandmother   . Hyperlipidemia Maternal Grandmother   . Stroke Maternal Grandmother   . Hypertension Maternal Grandmother   . Alzheimer's disease Maternal Grandmother   . Alcohol abuse Maternal Grandfather   . Hypertension Maternal Grandfather   . Diabetes Maternal Grandfather   . Cancer Paternal Grandmother     Ovarian  . Hypertension Paternal Grandmother   .  Alcohol abuse Paternal Grandfather   . Hyperlipidemia Paternal Grandfather   . Heart disease Paternal Grandfather   . Hypertension Paternal Grandfather   . Breast cancer Cousin     pat cousin   Social History   Social History  . Marital status: Married    Spouse name: N/A  . Number of children: 2  . Years of education: N/A   Social History Main Topics  . Smoking status: Never Smoker  . Smokeless tobacco: Never Used  . Alcohol use No  . Drug use: No  . Sexual activity: Not Asked   Other Topics Concern  . None   Social History Narrative  . None    Outpatient Encounter Prescriptions as of 12/31/2016  Medication Sig  . acyclovir (ZOVIRAX) 400 MG tablet Take one tablet tid for five days prn flares.  Marland Kitchen acyclovir cream (ZOVIRAX) 5 % Use as directed. (Patient taking differently: as needed. Use as directed.)  . benazepril-hydrochlorthiazide (LOTENSIN HCT) 20-12.5 MG tablet TAKE 1 TABLET DAILY  . buPROPion (WELLBUTRIN XL) 300 MG 24 hr tablet Take 1 tablet (300 mg total) by mouth daily.  Marland Kitchen EPINEPHrine 0.3 mg/0.3 mL IJ SOAJ injection Inject 0.3 mLs (0.3 mg total) into the muscle as needed (for anaphylaxis).  Marland Kitchen  levothyroxine (SYNTHROID, LEVOTHROID) 88 MCG tablet TAKE 1 TABLET DAILY BEFORE BREAKFAST  . omeprazole (PRILOSEC) 20 MG capsule TAKE 1 CAPSULE TWICE A DAY BEFORE MEALS  . rosuvastatin (CRESTOR) 5 MG tablet Take 1 tablet (5 mg total) by mouth daily.  Marland Kitchen amoxicillin (AMOXIL) 875 MG tablet Take 1 tablet (875 mg total) by mouth 2 (two) times daily.  . predniSONE (DELTASONE) 10 MG tablet Take 4 tablets x 1 day and then decrease by 1/2 tablet per day until down to zero mg.   No facility-administered encounter medications on file as of 12/31/2016.     Review of Systems  Constitutional: Negative for appetite change, chills and fever.  HENT: Positive for congestion, postnasal drip and sinus pressure.   Respiratory: Positive for cough. Negative for chest tightness and shortness of  breath.   Cardiovascular: Negative for chest pain, palpitations and leg swelling.  Gastrointestinal: Negative for diarrhea, nausea and vomiting.  Skin: Negative for color change and rash.  Neurological: Negative for dizziness, light-headedness and headaches.       Objective:    Physical Exam  Constitutional: She appears well-developed and well-nourished. No distress.  HENT:  Mouth/Throat: Oropharynx is clear and moist.  Nares - slightly erythematous turbinates.    Neck: Neck supple.  Cardiovascular: Normal rate and regular rhythm.   Pulmonary/Chest: Breath sounds normal. No respiratory distress.  Increased cough with forced expiration.    Musculoskeletal: She exhibits no edema.  Lymphadenopathy:    She has no cervical adenopathy.    BP 108/66 (BP Location: Right Arm, Patient Position: Sitting, Cuff Size: Large)   Pulse 88   Temp 98.5 F (36.9 C) (Oral)   Resp 18   Wt 180 lb 2 oz (81.7 kg)   SpO2 98%   BMI 30.92 kg/m  Wt Readings from Last 3 Encounters:  12/31/16 180 lb 2 oz (81.7 kg)  10/14/16 183 lb 12.8 oz (83.4 kg)  07/10/16 189 lb 8 oz (86 kg)     Lab Results  Component Value Date   WBC 6.9 05/23/2016   HGB 13.0 05/23/2016   HCT 38.8 05/23/2016   PLT 303.0 05/23/2016   GLUCOSE 100 (H) 10/07/2016   CHOL 233 (H) 10/07/2016   TRIG 179.0 (H) 10/07/2016   HDL 48.50 10/07/2016   LDLDIRECT 143.0 05/23/2016   LDLCALC 148 (H) 10/07/2016   ALT 27 10/07/2016   AST 21 10/07/2016   NA 140 10/07/2016   K 4.1 10/07/2016   CL 104 10/07/2016   CREATININE 0.97 10/07/2016   BUN 19 10/07/2016   CO2 26 10/07/2016   TSH 4.16 05/23/2016    Mm Digital Screening  Result Date: 12/20/2015 CLINICAL DATA:  Screening. EXAM: DIGITAL SCREENING BILATERAL MAMMOGRAM WITH CAD COMPARISON:  Previous exam(s). ACR Breast Density Category b: There are scattered areas of fibroglandular density. FINDINGS: There are no findings suspicious for malignancy. Images were processed with CAD.  IMPRESSION: No mammographic evidence of malignancy. A result letter of this screening mammogram will be mailed directly to the patient. RECOMMENDATION: Screening mammogram in one year. (Code:SM-B-01Y) BI-RADS CATEGORY  1: Negative. Electronically Signed   By: Franki Cabot M.D.   On: 12/20/2015 09:56       Assessment & Plan:   Problem List Items Addressed This Visit    Cough    Increased sinus pressure, congestion and cough.  Increased cough with forced expiration.  Treat with amoxicillin 875mg  bid and prednisone taper as directed.  Saline nasal spray and nasacort nasal spray as directed.  Mucinex/robitussin as directed.  Follow.            Einar Pheasant, MD

## 2016-12-31 NOTE — Progress Notes (Signed)
Pre visit review using our clinic review tool, if applicable. No additional management support is needed unless otherwise documented below in the visit note. 

## 2017-01-12 ENCOUNTER — Encounter: Payer: Self-pay | Admitting: Internal Medicine

## 2017-01-12 NOTE — Assessment & Plan Note (Signed)
Increased sinus pressure, congestion and cough.  Increased cough with forced expiration.  Treat with amoxicillin 875mg  bid and prednisone taper as directed.  Saline nasal spray and nasacort nasal spray as directed.  Mucinex/robitussin as directed.  Follow.

## 2017-01-13 ENCOUNTER — Ambulatory Visit (INDEPENDENT_AMBULATORY_CARE_PROVIDER_SITE_OTHER): Payer: 59

## 2017-01-13 ENCOUNTER — Encounter: Payer: Self-pay | Admitting: Family

## 2017-01-13 ENCOUNTER — Ambulatory Visit (INDEPENDENT_AMBULATORY_CARE_PROVIDER_SITE_OTHER): Payer: 59 | Admitting: Family

## 2017-01-13 VITALS — BP 106/72 | HR 80 | Temp 99.1°F | Ht 64.0 in | Wt 178.0 lb

## 2017-01-13 DIAGNOSIS — J209 Acute bronchitis, unspecified: Secondary | ICD-10-CM

## 2017-01-13 MED ORDER — AZITHROMYCIN 250 MG PO TABS: ORAL_TABLET | ORAL | 0 refills | Status: DC

## 2017-01-13 MED ORDER — ALBUTEROL SULFATE HFA 108 (90 BASE) MCG/ACT IN AERS: 2.0000 | INHALATION_SPRAY | Freq: Four times a day (QID) | RESPIRATORY_TRACT | 1 refills | Status: DC | PRN

## 2017-01-13 MED ORDER — BENZONATATE 100 MG PO CAPS: 100.0000 mg | ORAL_CAPSULE | Freq: Two times a day (BID) | ORAL | 0 refills | Status: DC | PRN

## 2017-01-13 MED ORDER — ALBUTEROL SULFATE (2.5 MG/3ML) 0.083% IN NEBU: 2.5000 mg | INHALATION_SOLUTION | Freq: Once | RESPIRATORY_TRACT | Status: DC

## 2017-01-13 NOTE — Progress Notes (Signed)
Pre visit review using our clinic review tool, if applicable. No additional management support is needed unless otherwise documented below in the visit note. 

## 2017-01-13 NOTE — Patient Instructions (Signed)
Chest xray  Use albuterol every 6 hours for first 24 hours to get good medication into the lungs and loosen congestion; after, you may use as needed and eventually stop all together when cough resolves.  Will await xray results prior to starting new antibiotic.   If there is no improvement in your symptoms, or if there is any worsening of symptoms, or if you have any additional concerns, please return for re-evaluation; or, if we are closed, consider going to the Emergency Room for evaluation if symptoms urgent.   Ms.

## 2017-01-13 NOTE — Progress Notes (Signed)
Subjective:    Patient ID: Anna [name], female    DOB: Mar 06, 1956, 61 y.o.   MRN: [id number]  CC: Anna [name] is a 61 y.o. female who presents today for an acute visit.    HPI: Dry cough, orginally started 4 weeks. Seen 10 days ago for sinus congestion, treated with amoxicillin, prednionse. Felt 'some better' and than has returned, worsened.    Endorses SOB, wheezing, fever tmax 100, chills ( resolved).    No h/o lung disease. Non smoker. Has had to use inhaler in the past.     HISTORY:  Past Medical History:  Diagnosis Date  . Arthritis   . Depression   . Diverticulitis    H/O  . Frequent headaches    H/O  . GERD (gastroesophageal reflux disease)   . History of chicken pox   . Hypercholesterolemia   . Hypertension   . Hypothyroidism    Past Surgical History:  Procedure Laterality Date  . APPENDECTOMY  1981  . CARPAL TUNNEL RELEASE  2000  . [place]  . EYE SURGERY Bilateral 2008   Lasix eye srg  . TUBAL LIGATION  1985   Family History  Problem Relation Age of Onset  . Hyperlipidemia Mother   . Hypertension Mother   . Alzheimer's disease Mother   . Arthritis Father   . Hyperlipidemia Father   . Heart disease Father     first MI age 70  . Hypertension Father   . Hyperlipidemia Brother   . Heart disease Brother     heart disease dx at a youg age  . Hypertension Brother   . Alzheimer's disease Maternal Aunt   . Hyperlipidemia Maternal Uncle   . Heart disease Maternal Uncle   . Sudden death Maternal Uncle 42    massive heart attack in doctors office  . Arthritis Maternal Grandmother   . Hyperlipidemia Maternal Grandmother   . Stroke Maternal Grandmother   . Hypertension Maternal Grandmother   . Alzheimer's disease Maternal Grandmother   . Alcohol abuse Maternal Grandfather   . Hypertension Maternal Grandfather   . Diabetes Maternal Grandfather   . Cancer Paternal Grandmother     Ovarian  . Hypertension Paternal  Grandmother   . Alcohol abuse Paternal Grandfather   . Hyperlipidemia Paternal Grandfather   . Heart disease Paternal Grandfather   . Hypertension Paternal Grandfather   . Breast cancer Cousin     pat cousin    Allergies: Patient has no known allergies. Current Outpatient Prescriptions on File Prior to Visit  Medication Sig Dispense Refill  . acyclovir (ZOVIRAX) 400 MG tablet Take one tablet tid for five days prn flares. 90 tablet 0  . acyclovir cream (ZOVIRAX) 5 % Use as directed. (Patient taking differently: as needed. Use as directed.) 15 g 0  . benazepril-hydrochlorthiazide (LOTENSIN HCT) 20-12.5 MG tablet TAKE 1 TABLET DAILY 90 tablet 3  . buPROPion (WELLBUTRIN XL) 300 MG 24 hr tablet Take 1 tablet (300 mg total) by mouth daily. 90 tablet 1  . EPINEPHrine 0.3 mg/0.3 mL IJ SOAJ injection Inject 0.3 mLs (0.3 mg total) into the muscle as needed (for anaphylaxis). 3 Device 0  . levothyroxine (SYNTHROID, LEVOTHROID) 88 MCG tablet TAKE 1 TABLET DAILY BEFORE BREAKFAST 90 tablet 0  . omeprazole (PRILOSEC) 20 MG capsule TAKE 1 CAPSULE TWICE A DAY BEFORE MEALS 180 capsule 1  . rosuvastatin (CRESTOR) 5 MG tablet Take 1 tablet (5 mg total) by mouth daily. [place]  tablet 1   No current facility-administered medications on file prior to visit.     Social History  Substance Use Topics  . Smoking status: Never Smoker  . Smokeless tobacco: Never Used  . Alcohol use No    Review of Systems  Constitutional: Positive for chills and fever.  HENT: Negative for congestion, sinus pressure and sore throat.   Respiratory: Positive for cough and wheezing. Negative for shortness of breath.   Cardiovascular: Negative for chest pain and palpitations.  Gastrointestinal: Negative for nausea and vomiting.      Objective:    BP 106/72   Pulse 80   Temp 99.1 F (37.3 C) (Oral)   Ht 5\' 4"  (1.626 m)   Wt 178 lb (80.7 kg)   SpO2 97%   BMI 30.55 kg/m    Physical Exam  Constitutional: She appears  well-developed and well-nourished.  HENT:  Head: Normocephalic and atraumatic.  Right Ear: Hearing, tympanic membrane, external ear and ear canal normal. No drainage, swelling or tenderness. No foreign bodies. Tympanic membrane is not erythematous and not bulging. No middle ear effusion. No decreased hearing is noted.  Left Ear: Hearing, tympanic membrane, external ear and ear canal normal. No drainage, swelling or tenderness. No foreign bodies. Tympanic membrane is not erythematous and not bulging.  No middle ear effusion. No decreased hearing is noted.  Nose: Nose normal. No rhinorrhea. Right sinus exhibits no maxillary sinus tenderness and no frontal sinus tenderness. Left sinus exhibits no maxillary sinus tenderness and no frontal sinus tenderness.  Mouth/Throat: Uvula is midline, oropharynx is clear and moist and mucous membranes are normal. No oropharyngeal exudate, posterior oropharyngeal edema, posterior oropharyngeal erythema or tonsillar abscesses.  Eyes: Conjunctivae are normal.  Cardiovascular: Regular rhythm, normal heart sounds and normal pulses.   Pulmonary/Chest: Effort normal and breath sounds normal. She has no wheezes. She has no rhonchi. She has no rales.  Lymphadenopathy:       Head (right side): No submental, no submandibular, no tonsillar, no preauricular, no posterior auricular and no occipital adenopathy present.       Head (left side): No submental, no submandibular, no tonsillar, no preauricular, no posterior auricular and no occipital adenopathy present.    She has no cervical adenopathy.  Neurological: She is alert.  Skin: Skin is warm and dry.  Psychiatric: She has a normal mood and affect. Her speech is normal and behavior is normal. Thought content normal.  Vitals reviewed. Patient felt no different after albuterol treatment. Lung sounds clear and increased      Assessment & Plan:   1. Bronchitis with bronchospasm No acute respiratory were distress. SaO2 97%.  Due to duration of symptoms, and worsening thereof, will start antibiotic however will await CXR. Pending CXR to ensure no PNA. Canceled mail order medications over phone.  - albuterol (PROVENTIL) (2.5 MG/3ML) 0.083% nebulizer solution 2.5 mg; Take 3 mLs (2.5 mg total) by nebulization once.  - benzonatate (TESSALON) 100 MG capsule; Take 1 capsule (100 mg total) by mouth 2 (two) times daily as needed for cough.  Dispense: 20 capsule; Refill: 0 - albuterol (PROVENTIL HFA) 108 (90 Base) MCG/ACT inhaler; Inhale 2 puffs into the lungs every 6 (six) hours as needed for wheezing or shortness of breath.  Dispense: 1 Inhaler; Refill: 1    I have discontinued Ms. Medaglia's amoxicillin and predniSONE. I am also having her maintain her EPINEPHrine, acyclovir cream, benazepril-hydrochlorthiazide, acyclovir, buPROPion, rosuvastatin, levothyroxine, and omeprazole. We will continue to administer albuterol.  Meds ordered this encounter  Medications  . albuterol (PROVENTIL) (2.5 MG/3ML) 0.083% nebulizer solution 2.5 mg    Return precautions given.   Risks, benefits, and alternatives of the medications and treatment plan prescribed today were discussed, and patient expressed understanding.   Education regarding symptom management and diagnosis given to patient on AVS.  Continue to follow with Einar Pheasant, MD for routine health maintenance.   Anna [name] and I agreed with plan.   Mable Paris, FNP

## 2017-01-14 ENCOUNTER — Other Ambulatory Visit: Payer: Self-pay | Admitting: Family

## 2017-01-14 DIAGNOSIS — J40 Bronchitis, not specified as acute or chronic: Secondary | ICD-10-CM

## 2017-01-14 MED ORDER — AZITHROMYCIN 250 MG PO TABS: ORAL_TABLET | ORAL | 0 refills | Status: DC

## 2017-01-14 NOTE — Progress Notes (Signed)
close

## 2017-01-16 ENCOUNTER — Other Ambulatory Visit: Payer: Self-pay | Admitting: Internal Medicine

## 2017-01-17 ENCOUNTER — Telehealth: Payer: Self-pay | Admitting: Internal Medicine

## 2017-01-17 DIAGNOSIS — R0602 Shortness of breath: Secondary | ICD-10-CM

## 2017-01-17 NOTE — Telephone Encounter (Signed)
Which would you prefer and also ok to do so?

## 2017-01-17 NOTE — Telephone Encounter (Signed)
Veronica from [employer] called and stated that albuterol (PROVENTIL HFA) 108 (90 Base) MCG/ACT inhaler is not covered under pt's formulary. They are looking for a verbal for one of the following ventil hsa inhaler, proair hsa inhaler, and proair respir click powder inhaler. Please advise, thank you!  Call @ [phone]  Ref # [id number]

## 2017-01-20 ENCOUNTER — Other Ambulatory Visit: Payer: Self-pay | Admitting: Internal Medicine

## 2017-01-20 NOTE — Telephone Encounter (Signed)
Maggie from [employer] called in regards to this. Please advise, thank you!  Call @ [phone] Ref # [id number]

## 2017-01-20 NOTE — Telephone Encounter (Signed)
We did this today right?

## 2017-01-21 MED ORDER — ALBUTEROL SULFATE 108 (90 BASE) MCG/ACT IN AEPB: 90.0000 ug | INHALATION_SPRAY | Freq: Four times a day (QID) | RESPIRATORY_TRACT | 2 refills | Status: DC | PRN

## 2017-01-21 NOTE — Telephone Encounter (Signed)
proair respiclick ordered. Please call [facility]

## 2017-01-21 NOTE — Telephone Encounter (Signed)
Doesn't look like this was ever changed to a preferred inhaler. The preferred inhalers are the ventolin hsa inhaler, proair hsa inhaler, or the proair respclick powder inhaler. Please advise.

## 2017-01-21 NOTE — Telephone Encounter (Signed)
Called and [facility] and changed inhaler to the preferred Proair respclick per Joycelyn Schmid Arnett's request.

## 2017-01-23 ENCOUNTER — Other Ambulatory Visit: Payer: Self-pay | Admitting: Family

## 2017-01-23 DIAGNOSIS — J209 Acute bronchitis, unspecified: Secondary | ICD-10-CM

## 2017-01-24 NOTE — Telephone Encounter (Signed)
Spoke with the patient.  She is very upset that she was told that the prescription was canceled to [facility] by both the CMA and in a mychart from the NP.  She received the inhalers today with a $70 bill.  I explained that what I can see has them canceled but that she should call [facility] to see if she can send them back, or to see what can be done.  She was not happy and then stated that she wanted to speak directly with the NP to explain that she was not happy and to discuss that she was unprofessional with her during her visit on the 26th of February.  Per the patient the NP confused her with another patient and violated HIPPA. I expressed an apology and told her I would pass the message ago.  CMA is calling [facility] on our end to see if we can assist with the cancellation and reimbursement/no charge if we can.  Please advise. thanks

## 2017-01-24 NOTE — Telephone Encounter (Signed)
Pt called and stated that she was in to see  Arnett last week and was prescribed an inhaler. [facility] was [facility] and [facility] should have been sent to [facility]. It was stated that we were to call [facility] and cancel the order. Pt states that she received 3 inhalers today from [facility] and is being charged $70.00. Pt states that she is not responsible as we were supposed to call and cancel this order. Please advise, thank you!  Call pt @ [phone]

## 2017-01-24 NOTE — Telephone Encounter (Signed)
Spoken to [facility]. I was informed by them that the order was canceled on the [id number] and that no order is pending.  The $70.00 was already credited to patients account.

## 2017-01-24 NOTE — Telephone Encounter (Signed)
See below

## 2017-01-27 ENCOUNTER — Other Ambulatory Visit: Payer: Self-pay

## 2017-01-27 DIAGNOSIS — J209 Acute bronchitis, unspecified: Secondary | ICD-10-CM

## 2017-01-27 MED ORDER — BENZONATATE 100 MG PO CAPS
100.0000 mg | ORAL_CAPSULE | Freq: Two times a day (BID) | ORAL | 0 refills | Status: DC | PRN
Start: 2017-01-27 — End: 2017-03-10

## 2017-01-27 NOTE — Telephone Encounter (Signed)
Thank you tanya for addressing patient's concerns  Please call and circle back with patient and speak to her on my behalf  I am very sorry that inhalers were mailed - and called as I told her in the  mychart message 15 minutes after she left and canceled with pharmacist over the phone.   From when brock called, appears it was canceled on that day from my call but for some reason they were still mailed.   Again, please apologize for inconvenience.  Please advise her to f/u with [place] if her symptoms continue

## 2017-01-27 NOTE — Telephone Encounter (Signed)
Spoke with the patient

## 2017-02-10 ENCOUNTER — Other Ambulatory Visit: Payer: 59

## 2017-02-11 ENCOUNTER — Ambulatory Visit: Payer: 59 | Admitting: Internal Medicine

## 2017-03-06 ENCOUNTER — Other Ambulatory Visit (INDEPENDENT_AMBULATORY_CARE_PROVIDER_SITE_OTHER): Payer: 59

## 2017-03-06 DIAGNOSIS — I10 Essential (primary) hypertension: Secondary | ICD-10-CM | POA: Diagnosis not present

## 2017-03-06 DIAGNOSIS — E7800 Pure hypercholesterolemia, unspecified: Secondary | ICD-10-CM

## 2017-03-06 LAB — HEPATIC FUNCTION PANEL
ALBUMIN: 4.3 g/dL (ref 3.5–5.2)
ALT: 23 U/L (ref 0–35)
AST: 21 U/L (ref 0–37)
Alkaline Phosphatase: 71 U/L (ref 39–117)
Bilirubin, Direct: 0 mg/dL (ref 0.0–0.3)
Total Bilirubin: 0.3 mg/dL (ref 0.2–1.2)
Total Protein: 7.3 g/dL (ref 6.0–8.3)

## 2017-03-06 LAB — LIPID PANEL
CHOL/HDL RATIO: 3
Cholesterol: 158 mg/dL (ref 0–200)
HDL: 53.2 mg/dL (ref >39.00)
LDL Cholesterol: 88 mg/dL (ref 0–99)
NonHDL: 105.01
TRIGLYCERIDES: 83 mg/dL (ref 0.0–149.0)
VLDL: 16.6 mg/dL (ref 0.0–40.0)

## 2017-03-06 LAB — BASIC METABOLIC PANEL
BUN: 15 mg/dL (ref 6–23)
CHLORIDE: 104 meq/L (ref 96–112)
CO2: 27 meq/L (ref 19–32)
CREATININE: 0.83 mg/dL (ref 0.40–1.20)
Calcium: 9.3 mg/dL (ref 8.4–10.5)
GFR: 74.48 mL/min (ref >60.00)
Glucose, Bld: 97 mg/dL (ref 70–99)
POTASSIUM: 4.6 meq/L (ref 3.5–5.1)
Sodium: 138 mEq/L (ref 135–145)

## 2017-03-07 ENCOUNTER — Encounter: Payer: Self-pay | Admitting: Internal Medicine

## 2017-03-10 ENCOUNTER — Encounter: Payer: Self-pay | Admitting: Internal Medicine

## 2017-03-10 ENCOUNTER — Ambulatory Visit (INDEPENDENT_AMBULATORY_CARE_PROVIDER_SITE_OTHER): Payer: 59 | Admitting: Internal Medicine

## 2017-03-10 VITALS — BP 110/68 | HR 83 | Temp 98.7°F | Resp 12 | Ht 64.0 in | Wt 179.8 lb

## 2017-03-10 DIAGNOSIS — L989 Disorder of the skin and subcutaneous tissue, unspecified: Secondary | ICD-10-CM

## 2017-03-10 DIAGNOSIS — E7800 Pure hypercholesterolemia, unspecified: Secondary | ICD-10-CM | POA: Diagnosis not present

## 2017-03-10 DIAGNOSIS — E039 Hypothyroidism, unspecified: Secondary | ICD-10-CM

## 2017-03-10 DIAGNOSIS — K219 Gastro-esophageal reflux disease without esophagitis: Secondary | ICD-10-CM

## 2017-03-10 DIAGNOSIS — I10 Essential (primary) hypertension: Secondary | ICD-10-CM | POA: Diagnosis not present

## 2017-03-10 DIAGNOSIS — F32A Depression, unspecified: Secondary | ICD-10-CM

## 2017-03-10 DIAGNOSIS — F329 Major depressive disorder, single episode, unspecified: Secondary | ICD-10-CM

## 2017-03-10 MED ORDER — EPINEPHRINE 0.3 MG/0.3ML IJ SOAJ: 0.3000 mg | Freq: Once | INTRAMUSCULAR | 0 refills | Status: AC

## 2017-03-10 NOTE — Progress Notes (Signed)
Patient ID: Anna [name], female   DOB: Jun 15, 1956, 61 y.o.   MRN: [id number]   Subjective:    Patient ID: Anna [name], female    DOB: 1956/05/17, 61 y.o.   MRN: [id number]  HPI  Patient here for a scheduled follow up.  She has been doing better.  Breathing better.  No increased cough or congestion.  Discussed her recent visit and f/u with Mable Paris.  No chest pain.  No acid reflux.  No abdominal pain.  Bowels moving.  s/p cataract surgery.  Doing well.  Has lesion under right breast.  Request referral to dermatology.     Past Medical History:  Diagnosis Date  . Arthritis   . Depression   . Diverticulitis    H/O  . Frequent headaches    H/O  . GERD (gastroesophageal reflux disease)   . History of chicken pox   . Hypercholesterolemia   . Hypertension   . Hypothyroidism    Past Surgical History:  Procedure Laterality Date  . APPENDECTOMY  1981  . CARPAL TUNNEL RELEASE  2000  . [place]  . EYE SURGERY Bilateral 2008   Lasix eye srg  . TUBAL LIGATION  1985   Family History  Problem Relation Age of Onset  . Hyperlipidemia Mother   . Hypertension Mother   . Alzheimer's disease Mother   . Arthritis Father   . Hyperlipidemia Father   . Heart disease Father     first MI age 61  . Hypertension Father   . Hyperlipidemia Brother   . Heart disease Brother     heart disease dx at a youg age  . Hypertension Brother   . Alzheimer's disease Maternal Aunt   . Hyperlipidemia Maternal Uncle   . Heart disease Maternal Uncle   . Sudden death Maternal Uncle 42    massive heart attack in doctors office  . Arthritis Maternal Grandmother   . Hyperlipidemia Maternal Grandmother   . Stroke Maternal Grandmother   . Hypertension Maternal Grandmother   . Alzheimer's disease Maternal Grandmother   . Alcohol abuse Maternal Grandfather   . Hypertension Maternal Grandfather   . Diabetes Maternal Grandfather   . Cancer Paternal Grandmother    Ovarian  . Hypertension Paternal Grandmother   . Alcohol abuse Paternal Grandfather   . Hyperlipidemia Paternal Grandfather   . Heart disease Paternal Grandfather   . Hypertension Paternal Grandfather   . Breast cancer Cousin     pat cousin   Social History   Social History  . Marital status: Married    Spouse name: N/A  . Number of children: 2  . Years of education: N/A   Social History Main Topics  . Smoking status: Never Smoker  . Smokeless tobacco: Never Used  . Alcohol use No  . Drug use: No  . Sexual activity: Not Asked   Other Topics Concern  . None   Social History Narrative  . None    Outpatient Encounter Prescriptions as of 03/10/2017  Medication Sig  . acyclovir (ZOVIRAX) 400 MG tablet Take one tablet tid for five days prn flares.  Marland Kitchen acyclovir cream (ZOVIRAX) 5 % Use as directed. (Patient taking differently: as needed. Use as directed.)  . Albuterol Sulfate (PROAIR RESPICLICK) 827 (90 Base) MCG/ACT AEPB Inhale 90 mcg into the lungs every 6 (six) hours as needed (as needed for cough, wheezing).  . benazepril-hydrochlorthiazide (LOTENSIN HCT) 20-12.5 MG tablet TAKE 1 TABLET DAILY  . buPROPion (  WELLBUTRIN XL) 300 MG 24 hr tablet Take 1 tablet (300 mg total) by mouth daily.  Marland Kitchen EPINEPHrine 0.3 mg/0.3 mL IJ SOAJ injection Inject 0.3 mLs (0.3 mg total) into the muscle as needed (for anaphylaxis).  Marland Kitchen levothyroxine (SYNTHROID, LEVOTHROID) 88 MCG tablet TAKE 1 TABLET DAILY BEFORE BREAKFAST  . omeprazole (PRILOSEC) 20 MG capsule TAKE 1 CAPSULE TWICE A DAY BEFORE MEALS  . rosuvastatin (CRESTOR) 5 MG tablet Take 1 tablet (5 mg total) by mouth daily.  . [DISCONTINUED] azithromycin (ZITHROMAX) 250 MG tablet Tale 500 mg PO on day 1, then 250 mg PO q24h x 4 days.  . [DISCONTINUED] benzonatate (TESSALON) 100 MG capsule Take 1 capsule (100 mg total) by mouth 2 (two) times daily as needed for cough.  . [EXPIRED] EPINEPHrine (EPIPEN 2-PAK) 0.3 mg/0.3 mL IJ SOAJ injection Inject 0.3  mLs (0.3 mg total) into the muscle once.   Facility-Administered Encounter Medications as of 03/10/2017  Medication  . albuterol (PROVENTIL) (2.5 MG/3ML) 0.083% nebulizer solution 2.5 mg    Review of Systems  Constitutional: Negative for appetite change, fatigue and unexpected weight change.  HENT: Negative for congestion and sinus pressure.   Respiratory: Negative for cough, chest tightness and shortness of breath.   Cardiovascular: Negative for chest pain, palpitations and leg swelling.  Gastrointestinal: Negative for abdominal pain, diarrhea, nausea and vomiting.  Genitourinary: Negative for difficulty urinating and dysuria.  Musculoskeletal: Negative for back pain and joint swelling.  Skin: Negative for color change and rash.  Neurological: Negative for dizziness, light-headedness and headaches.  Psychiatric/Behavioral: Negative for agitation and dysphoric mood.       Objective:    Physical Exam  Constitutional: She appears well-developed and well-nourished. No distress.  HENT:  Nose: Nose normal.  Mouth/Throat: Oropharynx is clear and moist.  Neck: Neck supple. No thyromegaly present.  Cardiovascular: Normal rate and regular rhythm.   Pulmonary/Chest: Breath sounds normal. No respiratory distress. She has no wheezes.  Abdominal: Soft. Bowel sounds are normal. There is no tenderness.  Musculoskeletal: She exhibits no edema or tenderness.  Lymphadenopathy:    She has no cervical adenopathy.  Skin: No rash noted. No erythema.  Psychiatric: She has a normal mood and affect. Her behavior is normal.    BP 110/68 (BP Location: Left Arm, Patient Position: Sitting, Cuff Size: Normal)   Pulse 83   Temp 98.7 F (37.1 C) (Oral)   Resp 12   Ht 5\' 4"  (1.626 m)   Wt 179 lb 12.8 oz (81.6 kg)   SpO2 98%   BMI 30.86 kg/m  Wt Readings from Last 3 Encounters:  03/10/17 179 lb 12.8 oz (81.6 kg)  01/13/17 178 lb (80.7 kg)  12/31/16 180 lb 2 oz (81.7 kg)     Lab Results    Component Value Date   WBC 6.9 05/23/2016   HGB 13.0 05/23/2016   HCT 38.8 05/23/2016   PLT 303.0 05/23/2016   GLUCOSE 97 03/06/2017   CHOL 158 03/06/2017   TRIG 83.0 03/06/2017   HDL 53.20 03/06/2017   LDLDIRECT 143.0 05/23/2016   LDLCALC 88 03/06/2017   ALT 23 03/06/2017   AST 21 03/06/2017   NA 138 03/06/2017   K 4.6 03/06/2017   CL 104 03/06/2017   CREATININE 0.83 03/06/2017   BUN 15 03/06/2017   CO2 27 03/06/2017   TSH 4.16 05/23/2016    Mm Digital Screening  Result Date: 12/20/2015 CLINICAL DATA:  Screening. EXAM: DIGITAL SCREENING BILATERAL MAMMOGRAM WITH CAD COMPARISON:  Previous exam(s).  ACR Breast Density Category b: There are scattered areas of fibroglandular density. FINDINGS: There are no findings suspicious for malignancy. Images were processed with CAD. IMPRESSION: No mammographic evidence of malignancy. A result letter of this screening mammogram will be mailed directly to the patient. RECOMMENDATION: Screening mammogram in one year. (Code:SM-B-01Y) BI-RADS CATEGORY  1: Negative. Electronically Signed   By: Franki Cabot M.D.   On: 12/20/2015 09:56       Assessment & Plan:   Problem List Items Addressed This Visit    Depression    On wellbutrin.  Stable.  Follow.        Essential hypertension, benign    Blood pressure under good control.  Continue same medication regimen.  Follow pressures.  Follow metabolic panel.        GERD (gastroesophageal reflux disease)    Controlled on omeprazole.        Hypercholesterolemia    Low cholesterol diet and exercise.  Follow lipid panel and liver function tests.        Hypothyroidism    On thyroid replacement.  Follow tsh.         Other Visit Diagnoses    Skin lesion    -  Primary   under right breast.  request referral to Dr Jarome Matin - dermatology.     Relevant Orders   Ambulatory referral to Dermatology       Einar Pheasant, MD

## 2017-03-10 NOTE — Progress Notes (Signed)
Pre-visit discussion using our clinic review tool. No additional management support is needed unless otherwise documented below in the visit note.  

## 2017-03-22 ENCOUNTER — Encounter: Payer: Self-pay | Admitting: Internal Medicine

## 2017-03-22 NOTE — Assessment & Plan Note (Signed)
Blood pressure under good control.  Continue same medication regimen.  Follow pressures.  Follow metabolic panel.   

## 2017-03-22 NOTE — Assessment & Plan Note (Signed)
Controlled on omeprazole.   

## 2017-03-22 NOTE — Assessment & Plan Note (Signed)
On wellbutrin.  Stable.  Follow.

## 2017-03-22 NOTE — Assessment & Plan Note (Signed)
On thyroid replacement.  Follow tsh.  

## 2017-03-22 NOTE — Assessment & Plan Note (Signed)
Low cholesterol diet and exercise.  Follow lipid panel and liver function tests.  

## 2017-03-26 ENCOUNTER — Encounter: Payer: Self-pay | Admitting: Internal Medicine

## 2017-04-30 ENCOUNTER — Other Ambulatory Visit: Payer: Self-pay | Admitting: Internal Medicine

## 2017-05-30 ENCOUNTER — Other Ambulatory Visit: Payer: Self-pay | Admitting: Internal Medicine

## 2017-06-14 ENCOUNTER — Other Ambulatory Visit: Payer: Self-pay | Admitting: Internal Medicine

## 2017-06-23 ENCOUNTER — Encounter: Payer: Self-pay | Admitting: Internal Medicine

## 2017-06-23 ENCOUNTER — Ambulatory Visit (INDEPENDENT_AMBULATORY_CARE_PROVIDER_SITE_OTHER): Payer: 59 | Admitting: Internal Medicine

## 2017-06-23 VITALS — BP 108/68 | HR 86 | Temp 98.6°F | Resp 12 | Ht 64.0 in | Wt 177.2 lb

## 2017-06-23 DIAGNOSIS — I10 Essential (primary) hypertension: Secondary | ICD-10-CM

## 2017-06-23 DIAGNOSIS — Z23 Encounter for immunization: Secondary | ICD-10-CM | POA: Diagnosis not present

## 2017-06-23 DIAGNOSIS — K219 Gastro-esophageal reflux disease without esophagitis: Secondary | ICD-10-CM

## 2017-06-23 DIAGNOSIS — F329 Major depressive disorder, single episode, unspecified: Secondary | ICD-10-CM

## 2017-06-23 DIAGNOSIS — Z1231 Encounter for screening mammogram for malignant neoplasm of breast: Secondary | ICD-10-CM

## 2017-06-23 DIAGNOSIS — E7800 Pure hypercholesterolemia, unspecified: Secondary | ICD-10-CM

## 2017-06-23 DIAGNOSIS — F32A Depression, unspecified: Secondary | ICD-10-CM

## 2017-06-23 DIAGNOSIS — Z1239 Encounter for other screening for malignant neoplasm of breast: Secondary | ICD-10-CM

## 2017-06-23 DIAGNOSIS — R1032 Left lower quadrant pain: Secondary | ICD-10-CM | POA: Diagnosis not present

## 2017-06-23 DIAGNOSIS — E039 Hypothyroidism, unspecified: Secondary | ICD-10-CM

## 2017-06-23 DIAGNOSIS — R109 Unspecified abdominal pain: Secondary | ICD-10-CM | POA: Insufficient documentation

## 2017-06-23 LAB — CBC WITH DIFFERENTIAL/PLATELET
BASOS ABS: 0 {cells}/uL (ref 0–200)
Basophils Relative: 0 %
EOS PCT: 3 %
Eosinophils Absolute: 243 cells/uL (ref 15–500)
HCT: 40.5 % (ref 35.0–45.0)
Hemoglobin: 13.3 g/dL (ref 11.7–15.5)
LYMPHS PCT: 44 %
Lymphs Abs: 3564 cells/uL (ref 850–3900)
MCH: 30.6 pg (ref 27.0–33.0)
MCHC: 32.8 g/dL (ref 32.0–36.0)
MCV: 93.1 fL (ref 80.0–100.0)
MONOS PCT: 6 %
MPV: 9.6 fL (ref 7.5–12.5)
Monocytes Absolute: 486 cells/uL (ref 200–950)
NEUTROS PCT: 47 %
Neutro Abs: 3807 cells/uL (ref 1500–7800)
PLATELETS: 317 10*3/uL (ref 140–400)
RBC: 4.35 MIL/uL (ref 3.80–5.10)
RDW: 13.6 % (ref 11.0–15.0)
WBC: 8.1 10*3/uL (ref 3.8–10.8)

## 2017-06-23 MED ORDER — TETANUS-DIPHTH-ACELL PERTUSSIS 5-2.5-18.5 LF-MCG/0.5 IM SUSP: 0.5000 mL | Freq: Once | INTRAMUSCULAR | 0 refills | Status: AC

## 2017-06-23 MED ORDER — ZOSTER VAC RECOMB ADJUVANTED 50 MCG/0.5ML IM SUSR: 0.5000 mL | Freq: Once | INTRAMUSCULAR | 0 refills | Status: AC

## 2017-06-23 NOTE — Progress Notes (Signed)
Pre-visit discussion using our clinic review tool. No additional management support is needed unless otherwise documented below in the visit note.  

## 2017-06-23 NOTE — Progress Notes (Signed)
Patient ID: Anna [name], female   DOB: February 10, 1956, 61 y.o.   MRN: [id number]   Subjective:    Patient ID: Anna [name], female    DOB: January 14, 1956, 61 y.o.   MRN: [id number]  HPI  Patient here for a scheduled follow up.  States she is doing relatively well.  Still with increased stress, but feels she is handling things relatively well.  Trying to stay active.  No chest pain.  No sob.  No acid reflux.  No nausea or vomiting.  Does report some persistent intermittent LLQ pain.  Started two months ago.  Occurs daily.  Pain ranges 5-8 (on pain scale).  Having regular bowel movements.  Takes a probiotic.  No blood in her stool.  Last weekend - dysuria.  Resolved now.  No vaginal discharge.     Past Medical History:  Diagnosis Date  . Arthritis   . Depression   . Diverticulitis    H/O  . Frequent headaches    H/O  . GERD (gastroesophageal reflux disease)   . History of chicken pox   . Hypercholesterolemia   . Hypertension   . Hypothyroidism    Past Surgical History:  Procedure Laterality Date  . APPENDECTOMY  1981  . CARPAL TUNNEL RELEASE  2000  . [place]  . EYE SURGERY Bilateral 2008   Lasix eye srg  . TUBAL LIGATION  1985   Family History  Problem Relation Age of Onset  . Hyperlipidemia Mother   . Hypertension Mother   . Alzheimer's disease Mother   . Arthritis Father   . Hyperlipidemia Father   . Heart disease Father        first MI age 55  . Hypertension Father   . Hyperlipidemia Brother   . Heart disease Brother        heart disease dx at a youg age  . Hypertension Brother   . Alzheimer's disease Maternal Aunt   . Hyperlipidemia Maternal Uncle   . Heart disease Maternal Uncle   . Sudden death Maternal Uncle 42       massive heart attack in doctors office  . Arthritis Maternal Grandmother   . Hyperlipidemia Maternal Grandmother   . Stroke Maternal Grandmother   . Hypertension Maternal Grandmother   . Alzheimer's disease  Maternal Grandmother   . Alcohol abuse Maternal Grandfather   . Hypertension Maternal Grandfather   . Diabetes Maternal Grandfather   . Cancer Paternal Grandmother        Ovarian  . Hypertension Paternal Grandmother   . Alcohol abuse Paternal Grandfather   . Hyperlipidemia Paternal Grandfather   . Heart disease Paternal Grandfather   . Hypertension Paternal Grandfather   . Breast cancer Cousin        pat cousin   Social History   Social History  . Marital status: Married    Spouse name: N/A  . Number of children: 2  . Years of education: N/A   Social History Main Topics  . Smoking status: Never Smoker  . Smokeless tobacco: Never Used  . Alcohol use No  . Drug use: No  . Sexual activity: Not Asked   Other Topics Concern  . None   Social History Narrative  . None    Outpatient Encounter Prescriptions as of 06/23/2017  Medication Sig  . acyclovir (ZOVIRAX) 400 MG tablet Take one tablet tid for five days prn flares.  Marland Kitchen acyclovir cream (ZOVIRAX) 5 % Use as directed. (Patient  taking differently: as needed. Use as directed.)  . benazepril-hydrochlorthiazide (LOTENSIN HCT) 20-12.5 MG tablet TAKE 1 TABLET DAILY  . buPROPion (WELLBUTRIN XL) 300 MG 24 hr tablet TAKE 1 TABLET DAILY  . EPINEPHrine 0.3 mg/0.3 mL IJ SOAJ injection Inject 0.3 mLs (0.3 mg total) into the muscle as needed (for anaphylaxis).  Marland Kitchen levothyroxine (SYNTHROID, LEVOTHROID) 88 MCG tablet TAKE 1 TABLET DAILY BEFORE BREAKFAST  . omeprazole (PRILOSEC) 20 MG capsule TAKE 1 CAPSULE TWICE A DAY BEFORE MEALS  . rosuvastatin (CRESTOR) 5 MG tablet TAKE 1 TABLET DAILY  . [EXPIRED] Tdap (BOOSTRIX) 5-2.5-18.5 LF-MCG/0.5 injection Inject 0.5 mLs into the muscle once.  . [EXPIRED] Zoster Vac Recomb Adjuvanted (SHINGRIX) injection Inject 0.5 mLs into the muscle once.  . [DISCONTINUED] Albuterol Sulfate (PROAIR RESPICLICK) 409 (90 Base) MCG/ACT AEPB Inhale 90 mcg into the lungs every 6 (six) hours as needed (as needed for cough,  wheezing).  . [DISCONTINUED] albuterol (PROVENTIL) (2.5 MG/3ML) 0.083% nebulizer solution 2.5 mg    No facility-administered encounter medications on file as of 06/23/2017.     Review of Systems  Constitutional: Negative for appetite change and unexpected weight change.  HENT: Negative for congestion and sinus pressure.   Respiratory: Negative for cough, chest tightness and shortness of breath.   Cardiovascular: Negative for chest pain, palpitations and leg swelling.  Gastrointestinal: Positive for abdominal pain. Negative for diarrhea, nausea and vomiting.  Genitourinary: Negative for difficulty urinating.       Previous dysuria.  Resolved now.   Musculoskeletal: Negative for back pain and joint swelling.  Skin: Negative for color change and rash.  Neurological: Negative for dizziness, light-headedness and headaches.  Psychiatric/Behavioral: Negative for agitation and dysphoric mood.       Objective:    Physical Exam  Constitutional: She appears well-developed and well-nourished. No distress.  HENT:  Nose: Nose normal.  Mouth/Throat: Oropharynx is clear and moist.  Neck: Neck supple. No thyromegaly present.  Cardiovascular: Normal rate and regular rhythm.   Pulmonary/Chest: Breath sounds normal. No respiratory distress. She has no wheezes.  Abdominal: Soft. Bowel sounds are normal. There is no tenderness.  Musculoskeletal: She exhibits no edema or tenderness.  Lymphadenopathy:    She has no cervical adenopathy.  Skin: No rash noted. No erythema.  Psychiatric: She has a normal mood and affect. Her behavior is normal.    BP 108/68 (BP Location: Left Arm, Patient Position: Sitting, Cuff Size: Large)   Pulse 86   Temp 98.6 F (37 C) (Oral)   Resp 12   Ht 5\' 4"  (1.626 m)   Wt 177 lb 3.2 oz (80.4 kg)   SpO2 97%   BMI 30.42 kg/m  Wt Readings from Last 3 Encounters:  06/23/17 177 lb 3.2 oz (80.4 kg)  03/10/17 179 lb 12.8 oz (81.6 kg)  01/13/17 178 lb (80.7 kg)     Lab  Results  Component Value Date   WBC 6.9 05/23/2016   HGB 13.0 05/23/2016   HCT 38.8 05/23/2016   PLT 303.0 05/23/2016   GLUCOSE 97 03/06/2017   CHOL 158 03/06/2017   TRIG 83.0 03/06/2017   HDL 53.20 03/06/2017   LDLDIRECT 143.0 05/23/2016   LDLCALC 88 03/06/2017   ALT 23 03/06/2017   AST 21 03/06/2017   NA 138 03/06/2017   K 4.6 03/06/2017   CL 104 03/06/2017   CREATININE 0.83 03/06/2017   BUN 15 03/06/2017   CO2 27 03/06/2017   TSH 4.16 05/23/2016    Mm Digital Screening  Result  Date: 12/20/2015 CLINICAL DATA:  Screening. EXAM: DIGITAL SCREENING BILATERAL MAMMOGRAM WITH CAD COMPARISON:  Previous exam(s). ACR Breast Density Category b: There are scattered areas of fibroglandular density. FINDINGS: There are no findings suspicious for malignancy. Images were processed with CAD. IMPRESSION: No mammographic evidence of malignancy. A result letter of this screening mammogram will be mailed directly to the patient. RECOMMENDATION: Screening mammogram in one year. (Code:SM-B-01Y) BI-RADS CATEGORY  1: Negative. Electronically Signed   By: Franki Cabot M.D.   On: 12/20/2015 09:56       Assessment & Plan:   Problem List Items Addressed This Visit    Abdominal pain    Persistent LLQ pain as outlined.  Will check cbc, liver panel and metabolic panel.  Also check urine to confirm no infection.  Given persistent pain and intensity, etc, will obtain CT abdomen and pelvis.        Relevant Orders   CBC with Differential/Platelet   Basic metabolic panel   CT Abdomen Pelvis W Contrast   Urinalysis, Routine w reflex microscopic   Depression    On wellbutrin.  Overall feels she is doing relatively well.  Does not feel needs anything more at this time.  Follow.       Essential hypertension, benign    Blood pressure under good control.  Continue same medication regimen.  Follow pressures.  Follow metabolic panel.        GERD (gastroesophageal reflux disease)    Controlled on prilosec.         Hypercholesterolemia    On crestor.  Low cholesterol diet and exercise.  Follow lipid panel and liver function tests.        Relevant Orders   Hepatic function panel   Lipid panel   Hypothyroidism    On thyroid replacement.  Follow tsh.        Relevant Orders   TSH    Other Visit Diagnoses    Need for Tdap vaccination    -  Primary   Need for shingles vaccine       Screening for breast cancer       Relevant Orders   MM DIGITAL SCREENING BILATERAL       Einar Pheasant, MD

## 2017-06-24 ENCOUNTER — Encounter: Payer: Self-pay | Admitting: Internal Medicine

## 2017-06-24 ENCOUNTER — Telehealth: Payer: Self-pay | Admitting: *Deleted

## 2017-06-24 LAB — HEPATIC FUNCTION PANEL
ALBUMIN: 4.3 g/dL (ref 3.6–5.1)
ALT: 29 U/L (ref 6–29)
AST: 22 U/L (ref 10–35)
Alkaline Phosphatase: 69 U/L (ref 33–130)
BILIRUBIN TOTAL: 0.4 mg/dL (ref 0.2–1.2)
Bilirubin, Direct: 0.1 mg/dL (ref <=0.2)
Indirect Bilirubin: 0.3 mg/dL (ref 0.2–1.2)
Total Protein: 7 g/dL (ref 6.1–8.1)

## 2017-06-24 LAB — BASIC METABOLIC PANEL
BUN: 15 mg/dL (ref 7–25)
CALCIUM: 9.3 mg/dL (ref 8.6–10.4)
CO2: 26 mmol/L (ref 20–32)
CREATININE: 0.72 mg/dL (ref 0.50–0.99)
Chloride: 103 mmol/L (ref 98–110)
GLUCOSE: 85 mg/dL (ref 65–99)
Potassium: 4.1 mmol/L (ref 3.5–5.3)
SODIUM: 139 mmol/L (ref 135–146)

## 2017-06-24 LAB — URINALYSIS, ROUTINE W REFLEX MICROSCOPIC
BILIRUBIN URINE: NEGATIVE
GLUCOSE, UA: NEGATIVE
HGB URINE DIPSTICK: NEGATIVE
KETONES UR: NEGATIVE
Leukocytes, UA: NEGATIVE
Nitrite: NEGATIVE
PROTEIN: NEGATIVE
Specific Gravity, Urine: 1.02 (ref 1.001–1.035)
pH: 6 (ref 5.0–8.0)

## 2017-06-24 LAB — LIPID PANEL
CHOL/HDL RATIO: 2.9 ratio (ref <5.0)
CHOLESTEROL: 144 mg/dL (ref <200)
HDL: 50 mg/dL — AB (ref >50)
LDL Cholesterol: 68 mg/dL (ref <100)
Triglycerides: 130 mg/dL (ref <150)
VLDL: 26 mg/dL (ref <30)

## 2017-06-24 LAB — TSH: TSH: 1.74 mIU/L

## 2017-06-24 NOTE — Telephone Encounter (Signed)
Patient requested to know a estimated time for her CT scan referral  Pt contact [phone]

## 2017-06-24 NOTE — Assessment & Plan Note (Signed)
Controlled on prilosec.   

## 2017-06-24 NOTE — Assessment & Plan Note (Signed)
Blood pressure under good control.  Continue same medication regimen.  Follow pressures.  Follow metabolic panel.   

## 2017-06-24 NOTE — Assessment & Plan Note (Signed)
On wellbutrin.  Overall feels she is doing relatively well.  Does not feel needs anything more at this time.  Follow.

## 2017-06-24 NOTE — Telephone Encounter (Signed)
Called patient back she states the was told by Dr. Nicki Reaper that we would be calling with an appointment today or tomorrow she is going on vacation and was assured that she would have CT before she leaves on friday.

## 2017-06-24 NOTE — Assessment & Plan Note (Signed)
On crestor.  Low cholesterol diet and exercise.  Follow lipid panel and liver function tests.   

## 2017-06-24 NOTE — Assessment & Plan Note (Signed)
Persistent LLQ pain as outlined.  Will check cbc, liver panel and metabolic panel.  Also check urine to confirm no infection.  Given persistent pain and intensity, etc, will obtain CT abdomen and pelvis.

## 2017-06-24 NOTE — Assessment & Plan Note (Signed)
On thyroid replacement.  Follow tsh.  

## 2017-06-26 ENCOUNTER — Other Ambulatory Visit: Payer: Self-pay | Admitting: Internal Medicine

## 2017-06-26 ENCOUNTER — Ambulatory Visit
Admission: RE | Admit: 2017-06-26 | Discharge: 2017-06-26 | Disposition: A | Discharge status: Home / Self Care | Payer: 59 | Source: Ambulatory Visit | Attending: Internal Medicine | Admitting: Internal Medicine

## 2017-06-26 DIAGNOSIS — R935 Abnormal findings on diagnostic imaging of other abdominal regions, including retroperitoneum: Secondary | ICD-10-CM | POA: Diagnosis not present

## 2017-06-26 DIAGNOSIS — K760 Fatty (change of) liver, not elsewhere classified: Secondary | ICD-10-CM | POA: Insufficient documentation

## 2017-06-26 DIAGNOSIS — R1032 Left lower quadrant pain: Secondary | ICD-10-CM

## 2017-06-26 MED ORDER — IOPAMIDOL (ISOVUE-300) INJECTION 61%
100.0000 mL | Freq: Once | INTRAVENOUS | Status: AC | PRN
  Administered 2017-06-26: 100 mL via INTRAVENOUS

## 2017-06-26 NOTE — Progress Notes (Signed)
Order placed for oncology referral

## 2017-06-27 ENCOUNTER — Ambulatory Visit (HOSPITAL_BASED_OUTPATIENT_CLINIC_OR_DEPARTMENT_OTHER): Payer: 59

## 2017-06-27 ENCOUNTER — Ambulatory Visit (HOSPITAL_BASED_OUTPATIENT_CLINIC_OR_DEPARTMENT_OTHER): Payer: 59 | Admitting: Oncology

## 2017-06-27 VITALS — BP 123/73 | HR 103 | Temp 97.9°F | Resp 18 | Ht 64.0 in | Wt 176.9 lb

## 2017-06-27 DIAGNOSIS — F329 Major depressive disorder, single episode, unspecified: Secondary | ICD-10-CM | POA: Diagnosis not present

## 2017-06-27 DIAGNOSIS — K669 Disorder of peritoneum, unspecified: Secondary | ICD-10-CM

## 2017-06-27 DIAGNOSIS — E039 Hypothyroidism, unspecified: Secondary | ICD-10-CM | POA: Diagnosis not present

## 2017-06-27 DIAGNOSIS — Z809 Family history of malignant neoplasm, unspecified: Secondary | ICD-10-CM

## 2017-06-27 DIAGNOSIS — Z808 Family history of malignant neoplasm of other organs or systems: Secondary | ICD-10-CM

## 2017-06-27 DIAGNOSIS — K668 Other specified disorders of peritoneum: Secondary | ICD-10-CM

## 2017-06-27 LAB — LACTATE DEHYDROGENASE: LDH: 178 U/L (ref 125–245)

## 2017-06-27 NOTE — Progress Notes (Signed)
[place] New Patient Consult   Referring MD: Einar Pheasant, [address] [place], [place] [address]   Daena Kaliana Albino 61 y.o.  08/30/1956    Reason for Referral: Abdominal pain, CT with evidence of peritoneal implants   HPI: Ms. Cayton reports a 2 month history of left lower abdominal pain. The pain is more prominent at night. She takes ibuprofen for relief of the pain. No associated symptoms. She saw Dr. Nicki Reaper 06/23/2017. A CT of the abdomen and pelvis on 07/06/2017 revealed abnormal soft tissue in the midline anterior abdomen at the level of the pelvic brim. Soft tissue implants were seen at the bilateral paracolic gutter and left pelvic sidewall.  She is referred for oncology evaluation.  She had a normal colonoscopy 03/25/2014. She had a negative mammogram 12/20/2015.  Past Medical History:  Diagnosis Date  . Arthritis   . Depression   . Diverticulitis    H/O  . Frequent headaches    H/O  . GERD (gastroesophageal reflux disease)   . History of chicken pox   . Hypercholesterolemia   . Hypertension   . Hypothyroidism     .  Genital herpes   .  G2 P2  Past Surgical History:  Procedure Laterality Date  . APPENDECTOMY  1981  . CARPAL TUNNEL RELEASE  2000  . [place]  . EYE SURGERY Bilateral 2008   Lasix eye srg  . TUBAL LIGATION  1985    Medications: Reviewed  Allergies: No Known Allergies  Family history: Her paternal grandmother died of uterine cancer. Her paternal great-grandmother had "cancer ". No other family history of cancer. She has 2 brothers.  Social History: She lives with her husband in [place]. She is retired as an Glass blower/designer. She does not use cigarettes or alcohol. No risk factor for HIV or hepatitis.   ROS:   Positives include: Left low abdomen/pelvic pain for 2 months, 10 pound weight loss, occasional headache, constipation and loose stool  A complete ROS was  otherwise negative.  Physical Exam:  Blood pressure 123/73, pulse (!) 103, temperature 97.9 F (36.6 C), temperature source Oral, resp. rate 18, height 5\' 4"  (1.626 m), weight 176 lb 14.4 oz (80.2 kg), SpO2 99 %.  HEENT: Oropharynx without visible mass, neck without mass Lungs: Bronchial sounds at the left posterior chest, good air movement bilaterally, no respiratory distress Cardiac: Regular rate and rhythm Abdomen: No hepatosplenomegaly, no mass, no apparent ascites, nontender  Vascular: No leg edema Lymph nodes: No cervical, supraclavicular, left axillary, or inguinal nodes. 1 cm mobile right axillary node versus a prominent fat pad Neurologic: Alert and oriented, the motor exam appears intact in the upper and lower extremities Skin: No rash Musculoskeletal: No spine tenderness   LAB:  CBC  Lab Results  Component Value Date   WBC 8.1 06/23/2017   HGB 13.3 06/23/2017   HCT 40.5 06/23/2017   MCV 93.1 06/23/2017   PLT 317 06/23/2017   NEUTROABS 3,807 06/23/2017        CMP     Component Value Date/Time   NA 139 06/23/2017 1454   K 4.1 06/23/2017 1454   CL 103 06/23/2017 1454   CO2 26 06/23/2017 1454   GLUCOSE 85 06/23/2017 1454   BUN 15 06/23/2017 1454   CREATININE 0.72 06/23/2017 1454   CALCIUM 9.3 06/23/2017 1454   PROT 7.0 06/23/2017 1454   ALBUMIN 4.3 06/23/2017 1454   AST 22 06/23/2017 1454  ALT 29 06/23/2017 1454   ALKPHOS 69 06/23/2017 1454   BILITOT 0.4 06/23/2017 1454      Imaging:  Ct Abdomen Pelvis W Contrast  Result Date: 06/26/2017 CLINICAL DATA:  Left lower quadrant pain EXAM: CT ABDOMEN AND PELVIS WITH CONTRAST TECHNIQUE: Multidetector CT imaging of the abdomen and pelvis was performed using the standard protocol following bolus administration of intravenous contrast. CONTRAST:  169mL ISOVUE-300 IOPAMIDOL (ISOVUE-300) INJECTION 61% COMPARISON:  CT abdomen pelvis 01/30/2011 FINDINGS: Lower chest: No pulmonary nodules or pleural effusion. No  visible pericardial effusion. Hepatobiliary: There is hepatic steatosis. No focal liver lesion. Normal gallbladder. Pancreas: Normal contours without ductal dilatation. No peripancreatic fluid collection. Spleen: Normal. Adrenals/Urinary Tract: --Adrenal glands: Normal. --Right kidney/ureter: Right upper pole cyst measures 2.1 cm. No hydronephrosis. --Left kidney/ureter: No hydronephrosis or perinephric stranding. No nephrolithiasis. No obstructing ureteral stones. --Urinary bladder: Unremarkable. Stomach/Bowel: --Stomach/Duodenum: No hiatal hernia or other gastric abnormality. Normal duodenal course and caliber. --Small bowel: No dilatation or inflammation. --Colon: There are soft tissue implants along both paracolic gutters. --Appendix: Normal. Vascular/Lymphatic: Normal course and caliber of the major abdominal vessels. Scattered subcentimeter mesenteric lymph nodes. Reproductive: Normal uterus and ovaries. Musculoskeletal. Lower lumbar osteophytosis and facet arthrosis. No bony spinal canal stenosis. No lytic or blastic lesions. Other: There is a large cluster of abnormal soft tissue in the midline anterior abdomen at the level of the pelvic brim. As stated above, there are soft tissue implants along the peritoneal surface of both paracolic gutters. Peritoneal implants extend along the left pelvic sidewall. IMPRESSION: 1. Soft tissue implants within the lower midline anterior peritoneal rim and along the peritoneal surfaces of both paracolic gutters and the left pelvic wall are most consistent with metastatic disease from an unknown primary. Lymphoma is also a consideration, but less likely. 2. No primary malignancy identified. PET CT and histologic sampling of the peritoneal disease are recommended. 3. Hepatic steatosis Electronically Signed   By: Ulyses Jarred M.D.   On: 06/26/2017 14:34    CT images were reviewed with Ms. Peerson and her husband  Assessment/Plan:   1. Left abdomen/pelvic pain  CT  abdomen/pelvis 06/26/2017-soft tissue implants in the lower anterior peritoneum with implants at the paracolic gutters and left pelvic sidewall 2. Depression  3.   Hypothyroid  4.   Family history of uterine cancer-paternal grandmother   Disposition:   Ms. Sobocinski has a two-month history of left low abdomen/pelvic pain. A CT of the abdomen on 06/26/2017 reveals evidence of peritoneal/omental studding. I reviewed the CT images and discussed the differential diagnosis with Ms. Jefferys and her husband.  The appearance of the CT is most consistent with a malignancy, but it is possible the CT findings are related to an infection or another benign inflammatory process. The differential diagnosis includes ovarian cancer, primary peritoneal carcinoma, and metastatic disease from another primary tumor site.  We will obtain an LDH, CEA, and CA 125 today.  She will be referred for a CT-guided biopsy of the anterior peritoneal mass. She will return for an office visit and further discussion after the biopsy procedure.  50 minutes were spent with the patient today. The majority of the time was used for counseling and coordination of care.  Donneta Romberg, MD  06/27/2017, 3:16 PM

## 2017-06-28 LAB — CA 125: Cancer Antigen (CA) 125: 82.4 U/mL — ABNORMAL HIGH (ref 0.0–38.1)

## 2017-06-30 LAB — CEA (IN HOUSE-CHCC): CEA (CHCC-In House): <1 ng/mL (ref 0.00–5.00)

## 2017-07-01 ENCOUNTER — Telehealth: Payer: Self-pay | Admitting: Emergency Medicine

## 2017-07-01 NOTE — Telephone Encounter (Signed)
Spoke with patient regarding Gyn malignancy maker per md. Informed patient Dr.sherrill would like her to Complete biopsy as planned and follow up as planned. Patient verbalized understanding

## 2017-07-02 ENCOUNTER — Other Ambulatory Visit: Payer: Self-pay | Admitting: Radiology

## 2017-07-03 ENCOUNTER — Other Ambulatory Visit: Payer: Self-pay | Admitting: General Surgery

## 2017-07-04 ENCOUNTER — Encounter (HOSPITAL_COMMUNITY): Payer: Self-pay

## 2017-07-04 ENCOUNTER — Ambulatory Visit (HOSPITAL_COMMUNITY)
Admission: RE | Admit: 2017-07-04 | Discharge: 2017-07-04 | Disposition: A | Discharge status: Home / Self Care | Payer: 59 | Source: Ambulatory Visit | Attending: Oncology | Admitting: Oncology

## 2017-07-04 DIAGNOSIS — E7800 Pure hypercholesterolemia, unspecified: Secondary | ICD-10-CM | POA: Insufficient documentation

## 2017-07-04 DIAGNOSIS — Z803 Family history of malignant neoplasm of breast: Secondary | ICD-10-CM | POA: Diagnosis not present

## 2017-07-04 DIAGNOSIS — K219 Gastro-esophageal reflux disease without esophagitis: Secondary | ICD-10-CM | POA: Diagnosis not present

## 2017-07-04 DIAGNOSIS — Z820 Family history of epilepsy and other diseases of the nervous system: Secondary | ICD-10-CM | POA: Insufficient documentation

## 2017-07-04 DIAGNOSIS — C786 Secondary malignant neoplasm of retroperitoneum and peritoneum: Secondary | ICD-10-CM | POA: Insufficient documentation

## 2017-07-04 DIAGNOSIS — Z811 Family history of alcohol abuse and dependence: Secondary | ICD-10-CM | POA: Insufficient documentation

## 2017-07-04 DIAGNOSIS — K760 Fatty (change of) liver, not elsewhere classified: Secondary | ICD-10-CM | POA: Insufficient documentation

## 2017-07-04 DIAGNOSIS — Z79899 Other long term (current) drug therapy: Secondary | ICD-10-CM | POA: Insufficient documentation

## 2017-07-04 DIAGNOSIS — Z9889 Other specified postprocedural states: Secondary | ICD-10-CM | POA: Diagnosis not present

## 2017-07-04 DIAGNOSIS — Z8249 Family history of ischemic heart disease and other diseases of the circulatory system: Secondary | ICD-10-CM | POA: Insufficient documentation

## 2017-07-04 DIAGNOSIS — K668 Other specified disorders of peritoneum: Secondary | ICD-10-CM

## 2017-07-04 DIAGNOSIS — Z8041 Family history of malignant neoplasm of ovary: Secondary | ICD-10-CM | POA: Diagnosis not present

## 2017-07-04 DIAGNOSIS — R109 Unspecified abdominal pain: Secondary | ICD-10-CM | POA: Diagnosis not present

## 2017-07-04 DIAGNOSIS — E039 Hypothyroidism, unspecified: Secondary | ICD-10-CM | POA: Diagnosis not present

## 2017-07-04 DIAGNOSIS — I10 Essential (primary) hypertension: Secondary | ICD-10-CM | POA: Insufficient documentation

## 2017-07-04 DIAGNOSIS — Z823 Family history of stroke: Secondary | ICD-10-CM | POA: Diagnosis not present

## 2017-07-04 DIAGNOSIS — F329 Major depressive disorder, single episode, unspecified: Secondary | ICD-10-CM | POA: Insufficient documentation

## 2017-07-04 DIAGNOSIS — Z8261 Family history of arthritis: Secondary | ICD-10-CM | POA: Diagnosis not present

## 2017-07-04 LAB — CBC
HCT: 37.6 % (ref 36.0–46.0)
HEMOGLOBIN: 12.5 g/dL (ref 12.0–15.0)
MCH: 30.6 pg (ref 26.0–34.0)
MCHC: 33.2 g/dL (ref 30.0–36.0)
MCV: 91.9 fL (ref 78.0–100.0)
PLATELETS: 296 10*3/uL (ref 150–400)
RBC: 4.09 MIL/uL (ref 3.87–5.11)
RDW: 13.9 % (ref 11.5–15.5)
WBC: 7.5 10*3/uL (ref 4.0–10.5)

## 2017-07-04 LAB — PROTIME-INR
INR: 0.93
PROTHROMBIN TIME: 12.5 s (ref 11.4–15.2)

## 2017-07-04 LAB — APTT: aPTT: 27 seconds (ref 24–36)

## 2017-07-04 MED ORDER — MIDAZOLAM HCL 2 MG/2ML IJ SOLN
INTRAMUSCULAR | Status: AC
  Filled 2017-07-04: qty 6

## 2017-07-04 MED ORDER — LIDOCAINE HCL (PF) 1 % IJ SOLN
INTRAMUSCULAR | Status: AC
  Filled 2017-07-04: qty 30

## 2017-07-04 MED ORDER — MIDAZOLAM HCL 2 MG/2ML IJ SOLN
INTRAMUSCULAR | Status: AC | PRN
  Administered 2017-07-04 (×2): 1 mg via INTRAVENOUS

## 2017-07-04 MED ORDER — FENTANYL CITRATE (PF) 100 MCG/2ML IJ SOLN
INTRAMUSCULAR | Status: AC
  Filled 2017-07-04: qty 4

## 2017-07-04 MED ORDER — SODIUM CHLORIDE 0.9 % IV SOLN: INTRAVENOUS | Status: DC

## 2017-07-04 MED ORDER — FENTANYL CITRATE (PF) 100 MCG/2ML IJ SOLN
INTRAMUSCULAR | Status: AC | PRN
  Administered 2017-07-04: 25 ug via INTRAVENOUS
  Administered 2017-07-04: 50 ug via INTRAVENOUS

## 2017-07-04 NOTE — H&P (Signed)
Chief Complaint: Patient was seen in consultation today for omental mass biopsy at the request of Sherrill,Gary B  Referring Physician(s): Ladell Pier  Supervising Physician: Corrie Mckusick  Patient Status: [facility] - Out-pt  History of Present Illness: Anna [name] is a 61 y.o. female   Abd pain x 2 months CT revealed: IMPRESSION: 1. Soft tissue implants within the lower midline anterior peritoneal rim and along the peritoneal surfaces of both paracolic gutters and the left pelvic wall are most consistent with metastatic disease from an unknown primary. Lymphoma is also a consideration, but less likely. 2. No primary malignancy identified. PET CT and histologic sampling of the peritoneal disease are recommended. 3. Hepatic steatosis  Dr Benay Spice requesting biopsy of mass   Past Medical History:  Diagnosis Date  . Arthritis   . Depression   . Diverticulitis    H/O  . Frequent headaches    H/O  . GERD (gastroesophageal reflux disease)   . History of chicken pox   . Hypercholesterolemia   . Hypertension   . Hypothyroidism     Past Surgical History:  Procedure Laterality Date  . APPENDECTOMY  1981  . CARPAL TUNNEL RELEASE  2000  . [place]  . EYE SURGERY Bilateral 2008   Lasix eye srg  . TUBAL LIGATION  1985    Allergies: Bee venom and Other  Medications: Prior to Admission medications   Medication Sig Start Date End Date Taking? Authorizing Provider  benazepril-hydrochlorthiazide (LOTENSIN HCT) 20-12.5 MG tablet TAKE 1 TABLET DAILY 01/16/17  Yes Einar Pheasant, MD  buPROPion (WELLBUTRIN XL) 300 MG 24 hr tablet TAKE 1 TABLET DAILY 05/30/17  Yes Einar Pheasant, MD  Calcium Carb-Cholecalciferol (CALCIUM 1000 + D PO) Take 1 tablet by mouth daily.   Yes [provider]  levothyroxine (SYNTHROID, LEVOTHROID) 88 MCG tablet TAKE 1 TABLET DAILY BEFORE BREAKFAST 01/20/17  Yes Einar Pheasant, MD  Multiple Vitamin  (MULTIVITAMIN WITH MINERALS) TABS tablet Take 1 tablet by mouth daily.   Yes [provider]  omeprazole (PRILOSEC) 20 MG capsule TAKE 1 CAPSULE TWICE A DAY BEFORE MEALS 06/16/17  Yes Leone Haven, MD  rosuvastatin (CRESTOR) 5 MG tablet TAKE 1 TABLET DAILY 04/30/17  Yes Einar Pheasant, MD  acyclovir (ZOVIRAX) 400 MG tablet Take one tablet tid for five days prn flares. Patient taking differently: Take 400 mg by mouth 3 (three) times daily as needed (flare ups).  10/14/16   Einar Pheasant, MD  acyclovir cream (ZOVIRAX) 5 % Use as directed. Patient taking differently: Apply 1 application topically as needed (flare-ups). Use as directed. 09/22/15   Einar Pheasant, MD  EPINEPHrine 0.3 mg/0.3 mL IJ SOAJ injection Inject 0.3 mLs (0.3 mg total) into the muscle as needed (for anaphylaxis). 05/04/15   Einar Pheasant, MD     Family History  Problem Relation Age of Onset  . Hyperlipidemia Mother   . Hypertension Mother   . Alzheimer's disease Mother   . Arthritis Father   . Hyperlipidemia Father   . Heart disease Father        first MI age 31  . Hypertension Father   . Hyperlipidemia Brother   . Heart disease Brother        heart disease dx at a youg age  . Hypertension Brother   . Alzheimer's disease Maternal Aunt   . Hyperlipidemia Maternal Uncle   . Heart disease Maternal Uncle   . Sudden death Maternal Uncle 26  massive heart attack in doctors office  . Arthritis Maternal Grandmother   . Hyperlipidemia Maternal Grandmother   . Stroke Maternal Grandmother   . Hypertension Maternal Grandmother   . Alzheimer's disease Maternal Grandmother   . Alcohol abuse Maternal Grandfather   . Hypertension Maternal Grandfather   . Diabetes Maternal Grandfather   . Cancer Paternal Grandmother        Ovarian  . Hypertension Paternal Grandmother   . Alcohol abuse Paternal Grandfather   . Hyperlipidemia Paternal Grandfather   . Heart disease Paternal Grandfather   . Hypertension  Paternal Grandfather   . Breast cancer Cousin        pat cousin    Social History   Social History  . Marital status: Married    Spouse name: N/A  . Number of children: 2  . Years of education: N/A   Social History Main Topics  . Smoking status: Never Smoker  . Smokeless tobacco: Never Used  . Alcohol use No  . Drug use: No  . Sexual activity: Not Asked   Other Topics Concern  . None   Social History Narrative  . None    Review of Systems: A 12 point ROS discussed and pertinent positives are indicated in the HPI above.  All other systems are negative.  Review of Systems  Constitutional: Positive for fatigue. Negative for activity change, appetite change and fever.  Respiratory: Negative for cough and shortness of breath.   Gastrointestinal: Positive for abdominal pain. Negative for nausea and vomiting.  Musculoskeletal: Negative for back pain.  Neurological: Negative for weakness.  Psychiatric/Behavioral: Negative for behavioral problems and confusion.    Vital Signs: BP 137/87 (BP Location: Right Arm)   Pulse 84   Temp 98.1 F (36.7 C) (Oral)   Ht 5\' 3"  (1.6 m)   Wt 173 lb (78.5 kg)   SpO2 96%   BMI 30.65 kg/m   Physical Exam  Constitutional: She is oriented to person, place, and time.  Cardiovascular: Normal rate, regular rhythm and normal heart sounds.   Pulmonary/Chest: Effort normal and breath sounds normal.  Abdominal: Soft. Bowel sounds are normal.  Musculoskeletal: Normal range of motion.  Neurological: She is alert and oriented to person, place, and time.  Skin: Skin is warm and dry.  Psychiatric: She has a normal mood and affect. Her behavior is normal. Judgment and thought content normal.  Nursing note and vitals reviewed.   Mallampati Score:  MD Evaluation Airway: WNL Heart: WNL Abdomen: WNL Chest/ Lungs: WNL ASA  Classification: 2 Mallampati/Airway Score: One  Imaging: Ct Abdomen Pelvis W Contrast  Result Date: 06/26/2017 CLINICAL  DATA:  Left lower quadrant pain EXAM: CT ABDOMEN AND PELVIS WITH CONTRAST TECHNIQUE: Multidetector CT imaging of the abdomen and pelvis was performed using the standard protocol following bolus administration of intravenous contrast. CONTRAST:  128mL ISOVUE-300 IOPAMIDOL (ISOVUE-300) INJECTION 61% COMPARISON:  CT abdomen pelvis 01/30/2011 FINDINGS: Lower chest: No pulmonary nodules or pleural effusion. No visible pericardial effusion. Hepatobiliary: There is hepatic steatosis. No focal liver lesion. Normal gallbladder. Pancreas: Normal contours without ductal dilatation. No peripancreatic fluid collection. Spleen: Normal. Adrenals/Urinary Tract: --Adrenal glands: Normal. --Right kidney/ureter: Right upper pole cyst measures 2.1 cm. No hydronephrosis. --Left kidney/ureter: No hydronephrosis or perinephric stranding. No nephrolithiasis. No obstructing ureteral stones. --Urinary bladder: Unremarkable. Stomach/Bowel: --Stomach/Duodenum: No hiatal hernia or other gastric abnormality. Normal duodenal course and caliber. --Small bowel: No dilatation or inflammation. --Colon: There are soft tissue implants along both paracolic gutters. --Appendix: Normal.  Vascular/Lymphatic: Normal course and caliber of the major abdominal vessels. Scattered subcentimeter mesenteric lymph nodes. Reproductive: Normal uterus and ovaries. Musculoskeletal. Lower lumbar osteophytosis and facet arthrosis. No bony spinal canal stenosis. No lytic or blastic lesions. Other: There is a large cluster of abnormal soft tissue in the midline anterior abdomen at the level of the pelvic brim. As stated above, there are soft tissue implants along the peritoneal surface of both paracolic gutters. Peritoneal implants extend along the left pelvic sidewall. IMPRESSION: 1. Soft tissue implants within the lower midline anterior peritoneal rim and along the peritoneal surfaces of both paracolic gutters and the left pelvic wall are most consistent with metastatic  disease from an unknown primary. Lymphoma is also a consideration, but less likely. 2. No primary malignancy identified. PET CT and histologic sampling of the peritoneal disease are recommended. 3. Hepatic steatosis Electronically Signed   By: Ulyses Jarred M.D.   On: 06/26/2017 14:34    Labs:  CBC:  Recent Labs  06/23/17 1454 07/04/17 0939  WBC 8.1 7.5  HGB 13.3 12.5  HCT 40.5 37.6  PLT 317 296    COAGS: No results for input(s): INR, APTT in the last 8760 hours.  BMP:  Recent Labs  10/07/16 0857 03/06/17 1128 06/23/17 1454  NA 140 138 139  K 4.1 4.6 4.1  CL 104 104 103  CO2 26 27 26   GLUCOSE 100* 97 85  BUN 19 15 15   CALCIUM 9.5 9.3 9.3  CREATININE 0.97 0.83 0.72    LIVER FUNCTION TESTS:  Recent Labs  07/10/16 1140 10/07/16 0857 03/06/17 1128 06/23/17 1454  BILITOT 0.4 0.5 0.3 0.4  AST 26 21 21 22   ALT 30 27 23 29   ALKPHOS 73 68 71 69  PROT 7.2 7.4 7.3 7.0  ALBUMIN 4.2 4.3 4.3 4.3    TUMOR MARKERS: No results for input(s): AFPTM, CEA, CA199, CHROMGRNA in the last 8760 hours.  Assessment and Plan:  abd pain x 2 months Abnormal CT Scheduled now for omental mass biopsy Risks and benefits discussed with the patient including, but not limited to bleeding, infection, damage to adjacent structures or low yield requiring additional tests. All of the patient's questions were answered, patient is agreeable to proceed. Consent signed and in chart.  Thank you for this interesting consult.  I greatly enjoyed meeting Anna [name] and look forward to participating in their care.  A copy of this report was sent to the requesting provider on this date.  Electronically Signed: Lavonia Drafts, PA-C 07/04/2017, 10:33 AM   I spent a total of  30 Minutes   in face to face in clinical consultation, greater than 50% of which was counseling/coordinating care for omental mass bx

## 2017-07-04 NOTE — Discharge Instructions (Signed)

## 2017-07-04 NOTE — Sedation Documentation (Signed)
Bandaid R abd intact. Bedrest 1 hour  .

## 2017-07-04 NOTE — Sedation Documentation (Signed)
Patient is resting comfortably. 

## 2017-07-04 NOTE — Procedures (Signed)
Interventional Radiology Procedure Note  Procedure: CT guided biopsy of omental mass.  Concern for pelvic malignancy. .  Complications: None Recommendations:  - Ok to shower tomorrow - Do not submerge for 7 days - Routine wound care - 1 hour recovery   Signed,  Dulcy Fanny. Earleen Newport, DO

## 2017-07-07 ENCOUNTER — Telehealth: Payer: Self-pay | Admitting: Oncology

## 2017-07-07 ENCOUNTER — Ambulatory Visit (HOSPITAL_BASED_OUTPATIENT_CLINIC_OR_DEPARTMENT_OTHER): Payer: 59 | Admitting: Oncology

## 2017-07-07 VITALS — BP 115/73 | HR 94 | Temp 98.4°F | Resp 17 | Ht 63.0 in | Wt 173.8 lb

## 2017-07-07 DIAGNOSIS — R1031 Right lower quadrant pain: Secondary | ICD-10-CM | POA: Diagnosis not present

## 2017-07-07 DIAGNOSIS — F329 Major depressive disorder, single episode, unspecified: Secondary | ICD-10-CM | POA: Diagnosis not present

## 2017-07-07 DIAGNOSIS — C801 Malignant (primary) neoplasm, unspecified: Secondary | ICD-10-CM

## 2017-07-07 DIAGNOSIS — R1032 Left lower quadrant pain: Secondary | ICD-10-CM

## 2017-07-07 DIAGNOSIS — K668 Other specified disorders of peritoneum: Secondary | ICD-10-CM

## 2017-07-07 NOTE — Progress Notes (Signed)
[place] OFFICE PROGRESS NOTE   Diagnosis: Metastatic carcinoma  INTERVAL HISTORY:   Anna [name] underwent a CT-guided biopsy of an omental mass on 07/04/2017. She reports tolerating the procedure well. She continues to have bilateral lower abdominal pain. She is not taking pain medication. No difficulty with bowel function.   Objective:  Vital signs in last 24 hours:  Blood pressure 115/73, pulse 94, temperature 98.4 F (36.9 C), temperature source Oral, resp. rate 17, height 5\' 3"  (1.6 m), weight 173 lb 12.8 oz (78.8 kg), SpO2 99 %.    Resp: Lungs clear bilaterally Cardio: Regular rate and rhythm GI: No hepatosplenomegaly, no mass, nontender, no parasite is Vascular: No leg edema  Lab Results:  Lab Results  Component Value Date   WBC 7.5 07/04/2017   HGB 12.5 07/04/2017   HCT 37.6 07/04/2017   MCV 91.9 07/04/2017   PLT 296 07/04/2017   NEUTROABS 3,807 06/23/2017    CMP     Component Value Date/Time   NA 139 06/23/2017 1454   K 4.1 06/23/2017 1454   CL 103 06/23/2017 1454   CO2 26 06/23/2017 1454   GLUCOSE 85 06/23/2017 1454   BUN 15 06/23/2017 1454   CREATININE 0.72 06/23/2017 1454   CALCIUM 9.3 06/23/2017 1454   PROT 7.0 06/23/2017 1454   ALBUMIN 4.3 06/23/2017 1454   AST 22 06/23/2017 1454   ALT 29 06/23/2017 1454   ALKPHOS 69 06/23/2017 1454   BILITOT 0.4 06/23/2017 1454    Lab Results  Component Value Date   CEA1 <1.00 06/27/2017   CA 125-82.4   Imaging:  Ct Biopsy  Result Date: 07/04/2017 INDICATION: 61 year old female with a history of omental mass with concern for pelvic malignancy EXAM: CT-GUIDED BIOPSY OMENTAL MASS MEDICATIONS: None. ANESTHESIA/SEDATION: Moderate (conscious) sedation was employed during this procedure. A total of Versed 2.0 mg and Fentanyl 75 mcg was administered intravenously. Moderate Sedation Time: 15 minutes. The patient's level of consciousness and vital signs were monitored continuously by  radiology nursing throughout the procedure under my direct supervision. FLUOROSCOPY TIME:  CT COMPLICATIONS: None PROCEDURE: Informed written consent was obtained from the patient after a thorough discussion of the procedural risks, benefits and alternatives. All questions were addressed. Maximal Sterile Barrier Technique was utilized including caps, mask, sterile gowns, sterile gloves, sterile drape, hand hygiene and skin antiseptic. A timeout was performed prior to the initiation of the procedure. Patient positioned supine position on the CT gantry table and a scout CT of the pelvis was performed for planning purposes. The patient is prepped and draped in the usual sterile fashion. The skin and subcutaneous tissues were generously infiltrated 1% lidocaine for local anesthesia. Using CT guidance, 17 gauge trocar needle was advanced into the omental mass in the anterior pelvis. Once the needle was confirmed position, multiple 18 gauge core biopsy were acquired. Needle was removed and a final image was stored. Patient tolerated the procedure well and remained hemodynamically stable throughout. No complications were encountered and no significant blood loss. IMPRESSION: Status post CT-guided biopsy of omental mass of the pelvis. Tissue specimen sent to pathology for complete histopathologic analysis. Signed, Dulcy Fanny. Earleen Newport, DO [facility] Rad[facility] Electronically Signed   By: Corrie Mckusick D.O.   On: 07/04/2017 15:52    Medications: I have reviewed the patient's current medications.  Assessment/Plan:  1. Left abdomen/pelvic pain ? CT abdomen/pelvis 06/26/2017-soft tissue implants in the lower anterior peritoneum with implants at the paracolic gutters and  left pelvic sidewall ? Elevated CA 125 ? CT biopsy of anterior omental mass 07/04/2017 2. Depression  3.   Hypothyroid  4.   Family history of uterine cancer-paternal  grandmother    Disposition:  Anna [name] underwent a CT-guided biopsy of an omental mass on 07/04/2017. I discussed the pulmonary pathology with Dr. Lyndon Code. He indicates the biopsy is positive for a malignancy and he favors a GYN primary based on the histology review. Immunohistochemical stains will be performed and a final report should be available on 07/08/2017.  I discussed the preliminary pathology findings with Anna [name] and her husband. We discussed the likelihood of a GYN primary (ovarian carcinoma versus primary peritoneal carcinoma). I made a referral to GYN oncology. I will coordinate a treatment plan with Dr. Denman George if a GYN primary is confirmed.  Anna [name] will return for an office visit and further discussion on 07/11/2017.  Donneta Romberg, MD  07/07/2017  1:25 PM

## 2017-07-07 NOTE — Telephone Encounter (Signed)
Gave pt avs for the summary of her visit.

## 2017-07-08 NOTE — Progress Notes (Signed)
Consult Note: Gyn-Onc  Anna [name] 61 y.o. female  CC:  Chief Complaint  Patient presents with  . Metastatic carcinoma (HCC)    HPI: Patient is seen today in consultation at the request of Dr. Julieanne Manson. Primary physician Dr. Einar Pheasant.  Patient is a very pleasant 61 year old gravida 2 para 2 with menopause in her early 26s. She did take birth control pills on and off the proximally 10 years. She states that starting a few months ago she began experiencing some left lower quadrant pain. She has a known history of diverticulosis that has never had diverticulitis. Because of this persistent discomforts she saw Dr. Nicki Reaper who ordered a CT scan. The findings are as above.  IMPRESSION: 1. Soft tissue implants within the lower midline anterior peritoneal rim and along the peritoneal surfaces of both paracolic gutters and the left pelvic wall are most consistent with metastatic disease from an unknown primary. Lymphoma is also a consideration, but less likely. 2. No primary malignancy identified. PET CT and histologic sampling of the peritoneal disease are recommended. 3. Hepatic steatosis  She went to see Dr. Benay Spice as he took care of her husband for his colon cancer. Tumor markers were drawn and ultimately she underwent a biopsy.   CA-125 82.4, CEA <1.0 Diagnosis Omentum, biopsy - METASTATIC CARCINOMA, CONSISTENT WITH A GYNECOLOGIC PRIMARY. - SEE COMMENT. Microscopic Comment The malignant cells are positive for cytokeratin 7, p53, and PAX-8. They are negative for CDX-2 and cytokeratin 20. The findings are consistent with a primary gynecologic carcinoma. I spoke to Dr. 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 While there are some focal high-grade features is not quite clear.  Her last colonoscopy was 2 years ago. She is due now for her mammogram. Her last Pap smear was 2 years ago and it was normal.  Review of Systems. Constitutional: Denies fever. She has  lost about 10 pounds in the last 6 months to a year. It was unintentional but not unwelcome. She can go up a flight of stairs in her METs or greater than 4 Skin: No rash Cardiovascular: No chest pain, shortness of breath, or edema  Pulmonary: No cough  Gastro Intestinal: Reporting intermittent lower abdominal soreness.  No nausea, vomiting. She does endorse early satiety for about the past month. She has had some intermittent constipation and loose stools in the normal stools but has a bowel movement every day. Genitourinary: Denies vaginal bleeding and discharge.  Musculoskeletal: No joint swelling or pain.  Neurologic: No weakness Psychology: Understandably worried as she took care of her sister-in-law (her brother's wife) who died of primary peritoneal carcinoma proximally 8 months after diagnosis.    Current Meds:  Outpatient Encounter Prescriptions as of 07/09/2017  Medication Sig  . benazepril-hydrochlorthiazide (LOTENSIN HCT) 20-12.5 MG tablet TAKE 1 TABLET DAILY  . buPROPion (WELLBUTRIN XL) 300 MG 24 hr tablet TAKE 1 TABLET DAILY  . Calcium Carb-Cholecalciferol (CALCIUM 1000 + D PO) Take 1 tablet by mouth daily.  Marland Kitchen levothyroxine (SYNTHROID, LEVOTHROID) 88 MCG tablet TAKE 1 TABLET DAILY BEFORE BREAKFAST  . Multiple Vitamin (MULTIVITAMIN WITH MINERALS) TABS tablet Take 1 tablet by mouth daily.  Marland Kitchen omeprazole (PRILOSEC) 20 MG capsule TAKE 1 CAPSULE TWICE A DAY BEFORE MEALS  . rosuvastatin (CRESTOR) 5 MG tablet TAKE 1 TABLET DAILY  . acyclovir (ZOVIRAX) 400 MG tablet Take one tablet tid for five days prn flares. (Patient not taking: Reported on 07/09/2017)  . acyclovir cream (ZOVIRAX) 5 % Use  as directed. (Patient not taking: Reported on 07/09/2017)  . EPINEPHrine 0.3 mg/0.3 mL IJ SOAJ injection Inject 0.3 mLs (0.3 mg total) into the muscle as needed (for anaphylaxis). (Patient not taking: Reported on 07/09/2017)   No facility-administered encounter medications on file as of 07/09/2017.      Allergy:  Allergies  Allergen Reactions  . Bee Venom Anaphylaxis  . Other Anaphylaxis    Fire ants     Social Hx:   Social History   Social History  . Marital status: Married    Spouse name: N/A  . Number of children: 2  . Years of education: N/A   Occupational History  . Not on file.   Social History Main Topics  . Smoking status: Never Smoker  . Smokeless tobacco: Never Used  . Alcohol use No  . Drug use: No  . Sexual activity: Not on file   Other Topics Concern  . Not on file   Social History Narrative  . No narrative on file    Past Surgical Hx:  Past Surgical History:  Procedure Laterality Date  . APPENDECTOMY  1981  . CARPAL TUNNEL RELEASE  2000  . [place]  . EYE SURGERY Bilateral 2008   Lasix eye srg  . TUBAL LIGATION  1985    Past Medical Hx:  Past Medical History:  Diagnosis Date  . Arthritis   . Depression   . Diverticulitis    H/O  . Frequent headaches    H/O  . GERD (gastroesophageal reflux disease)   . History of chicken pox   . Hypercholesterolemia   . Hypertension   . Hypothyroidism     Oncology Hx:   No history exists.    Family Hx:  Family History  Problem Relation Age of Onset  . Hyperlipidemia Mother   . Hypertension Mother   . Alzheimer's disease Mother   . Arthritis Father   . Hyperlipidemia Father   . Heart disease Father        first MI age 58  . Hypertension Father   . Hyperlipidemia Brother   . Heart disease Brother        heart disease dx at a youg age  . Hypertension Brother   . Alzheimer's disease Maternal Aunt   . Hyperlipidemia Maternal Uncle   . Heart disease Maternal Uncle   . Sudden death Maternal Uncle 42       massive heart attack in doctors office  . Arthritis Maternal Grandmother   . Hyperlipidemia Maternal Grandmother   . Stroke Maternal Grandmother   . Hypertension Maternal Grandmother   . Alzheimer's disease Maternal Grandmother   . Alcohol abuse Maternal  Grandfather   . Hypertension Maternal Grandfather   . Diabetes Maternal Grandfather   . Cancer Paternal Grandmother        Ovarian  . Hypertension Paternal Grandmother   . Alcohol abuse Paternal Grandfather   . Hyperlipidemia Paternal Grandfather   . Heart disease Paternal Grandfather   . Hypertension Paternal Grandfather   . Breast cancer Cousin        pat cousin    Vitals:  Blood pressure 126/77, pulse 87, temperature 98.4 F (36.9 C), temperature source Oral, resp. rate 18, height _0  (1.6 m), weight 174 lb 4.8 oz (79.1 kg), SpO2 98 %.  Physical Exam:Well-nourished well-developed female in no acute distress.  Neck: Supple, no lymphadenopathy, no thyromegaly.  Lungs: Clear to auscultation bilaterally.  Cardiac: Regular rate and rhythm  Abdomen: Well-healed Pfannenstiel skin incision. Abdomen is soft, nontender, nondistended. There is no fluid wave. There is no hepatomegaly. There is no distinct abdominal pelvic mass appreciated.  Groins: No lymphadenopathy.  Extremity: No edema.  Pelvic: External genitalia within normal limits. Vagina slightly atrophic. The cervix is nulliparous. There are are no lesions. There is no discharge. There is no bleeding. Bimanual examination the uterus itself is of normal size shape and consistency there and there are no adnexal masses. However, when you push along the anterior wall the cervix does move concomitant with that. On rectal examination there is no nodularity.  Assessment/Plan:  61 year old with what appears to be an omental cake on CT scan. I cannot appreciate the peritoneal nodularity in the paracolic gutters that was seen by radiology. I showed the CT images to the patient and her husband today. They were shown the area just below the umbilicus that appears to be related to omental based disease.  We discussed that I would recommend proceeding with a diagnostic laparoscopy with an incision in the left upper quadrant. If at the time  of laparoscopy it appears that she has resectable disease, we would convert to an exploratory laparotomy TAH/BSO omentectomy and surgical debulking which could include removal of peritoneum and bowel. If at the time of diagnostic laparoscopy she has extensive disease that cannot be optimally resected, we will remove a larger portion of tumor for more pathologic evaluation and then proceed with neoadjuvant chemotherapy and interval cytoreductive surgery.  They understand the rationale a strategy for this. Risks of surgery including but not limited to bleeding, infection, injury to surrounding organs and possible bowel surgery were discussed with patient. We also discussed the risk of VTE and that she would be going home with Lovenox injections for 28 days if we proceed with a laparotomy and that she would also be wearing SCDs in the hospital.  Her questions as well as those of her husband were elicited in answer to their satisfaction.  Her surgery is tentatively scheduled for August 30 with Dr. Everitt Amber. I have spoken with Dr. Denman George about this patient's particular case.  We appreciate the opportunity to partner in the care of this very pleasant patient.  Caesar Mannella A., MD 07/09/2017, 11:51 AM

## 2017-07-09 ENCOUNTER — Ambulatory Visit: Payer: 59 | Attending: Gynecologic Oncology | Admitting: Gynecologic Oncology

## 2017-07-09 ENCOUNTER — Encounter: Payer: Self-pay | Admitting: Gynecologic Oncology

## 2017-07-09 VITALS — BP 126/77 | HR 87 | Temp 98.4°F | Resp 18 | Ht 63.0 in | Wt 174.3 lb

## 2017-07-09 DIAGNOSIS — K760 Fatty (change of) liver, not elsewhere classified: Secondary | ICD-10-CM | POA: Diagnosis not present

## 2017-07-09 DIAGNOSIS — Z803 Family history of malignant neoplasm of breast: Secondary | ICD-10-CM | POA: Diagnosis not present

## 2017-07-09 DIAGNOSIS — E7800 Pure hypercholesterolemia, unspecified: Secondary | ICD-10-CM | POA: Insufficient documentation

## 2017-07-09 DIAGNOSIS — C800 Disseminated malignant neoplasm, unspecified: Secondary | ICD-10-CM | POA: Insufficient documentation

## 2017-07-09 DIAGNOSIS — Z8249 Family history of ischemic heart disease and other diseases of the circulatory system: Secondary | ICD-10-CM | POA: Insufficient documentation

## 2017-07-09 DIAGNOSIS — C7989 Secondary malignant neoplasm of other specified sites: Secondary | ICD-10-CM | POA: Diagnosis present

## 2017-07-09 DIAGNOSIS — I10 Essential (primary) hypertension: Secondary | ICD-10-CM | POA: Diagnosis not present

## 2017-07-09 DIAGNOSIS — Z833 Family history of diabetes mellitus: Secondary | ICD-10-CM | POA: Insufficient documentation

## 2017-07-09 DIAGNOSIS — K219 Gastro-esophageal reflux disease without esophagitis: Secondary | ICD-10-CM | POA: Insufficient documentation

## 2017-07-09 DIAGNOSIS — Z79899 Other long term (current) drug therapy: Secondary | ICD-10-CM | POA: Diagnosis not present

## 2017-07-09 DIAGNOSIS — Z8261 Family history of arthritis: Secondary | ICD-10-CM | POA: Diagnosis not present

## 2017-07-09 DIAGNOSIS — E039 Hypothyroidism, unspecified: Secondary | ICD-10-CM | POA: Insufficient documentation

## 2017-07-09 DIAGNOSIS — F329 Major depressive disorder, single episode, unspecified: Secondary | ICD-10-CM | POA: Insufficient documentation

## 2017-07-09 DIAGNOSIS — Z9889 Other specified postprocedural states: Secondary | ICD-10-CM | POA: Diagnosis not present

## 2017-07-09 DIAGNOSIS — Z811 Family history of alcohol abuse and dependence: Secondary | ICD-10-CM | POA: Insufficient documentation

## 2017-07-09 DIAGNOSIS — C801 Malignant (primary) neoplasm, unspecified: Secondary | ICD-10-CM | POA: Diagnosis not present

## 2017-07-09 DIAGNOSIS — Z8041 Family history of malignant neoplasm of ovary: Secondary | ICD-10-CM | POA: Diagnosis not present

## 2017-07-09 DIAGNOSIS — C799 Secondary malignant neoplasm of unspecified site: Secondary | ICD-10-CM

## 2017-07-09 DIAGNOSIS — R1032 Left lower quadrant pain: Secondary | ICD-10-CM | POA: Diagnosis not present

## 2017-07-09 DIAGNOSIS — Z823 Family history of stroke: Secondary | ICD-10-CM | POA: Diagnosis not present

## 2017-07-09 DIAGNOSIS — C786 Secondary malignant neoplasm of retroperitoneum and peritoneum: Secondary | ICD-10-CM | POA: Diagnosis not present

## 2017-07-09 NOTE — Patient Instructions (Signed)
Preparing for your Surgery  Plan for surgery on July 17, 2017 with Dr. Everitt Amber at [place] will be scheduled for a diagnostic laparoscopy.  Based on results, you may have a total abdominal hysterectomy, bilateral salpingo-oophorectomy, omentectomy, tumor debulking.  Pre-operative Testing -You will receive a phone call from presurgical testing at [facility] to arrange for a pre-operative testing appointment before your surgery.  This appointment normally occurs one to two weeks before your scheduled surgery.   -Bring your insurance card, copy of an advanced directive if applicable, medication list  -At that visit, you will be asked to sign a consent for a possible blood transfusion in case a transfusion becomes necessary during surgery.  The need for a blood transfusion is rare but having consent is a necessary part of your care.     -You should not be taking blood thinners or aspirin at least ten days prior to surgery unless instructed by your surgeon.  -DRINK TWO BOTTLES OF MAGNESIUM CITRATE STARTING AT 4 PM THE DAY BEFORE SURGERY.  WHEN YOU BEGIN THIS, TAKE IN ONLY CLEAR LIQUIDS.  Day Before Surgery at [place] will be asked to take in a light diet the day before surgery.  Avoid carbonated beverages.  You will be advised to have nothing to eat or drink after midnight the evening before.     Eat a light diet the day before surgery.  Examples including soups, broths, toast, yogurt, mashed potatoes.  Things to avoid include carbonated beverages (fizzy beverages), raw fruits and raw vegetables, or beans.    If your bowels are filled with gas, your surgeon will have difficulty visualizing your pelvic organs which increases your surgical risks.  Your role in recovery Your role is to become active as soon as directed by your doctor, while still giving yourself time to heal.  Rest when you feel tired. You will be asked to do the following in order to  speed your recovery:  - Cough and breathe deeply. This helps toclear and expand your lungs and can prevent pneumonia. You may be given a spirometer to practice deep breathing. A staff member will show you how to use the spirometer. - Do mild physical activity. Walking or moving your legs help your circulation and body functions return to normal. A staff member will help you when you try to walk and will provide you with simple exercises. Do not try to get up or walk alone the first time. - Actively manage your pain. Managing your pain lets you move in comfort. We will ask you to rate your pain on a scale of zero to 10. It is your responsibility to tell your doctor or nurse where and how much you hurt so your pain can be treated.  Special Considerations -If you are diabetic, you may be placed on insulin after surgery to have closer control over your blood sugars to promote healing and recovery.  This does not mean that you will be discharged on insulin.  If applicable, your oral antidiabetics will be resumed when you are tolerating a solid diet.  -Your final pathology results from surgery should be available by the Friday after surgery and the results will be relayed to you when available.   Blood Transfusion Information WHAT IS A BLOOD TRANSFUSION? A transfusion is the replacement of blood or some of its parts. Blood is made up of multiple cells which provide different functions.  Red blood cells carry oxygen and are  used for blood loss replacement.  White blood cells fight against infection.  Platelets control bleeding.  Plasma helps clot blood.  Other blood products are available for specialized needs, such as hemophilia or other clotting disorders. BEFORE THE TRANSFUSION  Who gives blood for transfusions?   You may be able to donate blood to be used at a later date on yourself (autologous donation).  Relatives can be asked to donate blood. This is generally not any safer than if you  have received blood from a stranger. The same precautions are taken to ensure safety when a relative's blood is donated.  Healthy volunteers who are fully evaluated to make sure their blood is safe. This is blood bank blood. Transfusion therapy is the safest it has ever been in the practice of medicine. Before blood is taken from a donor, a complete history is taken to make sure that person has no history of diseases nor engages in risky social behavior (examples are intravenous drug use or sexual activity with multiple partners). The donor's travel history is screened to minimize risk of transmitting infections, such as malaria. The donated blood is tested for signs of infectious diseases, such as HIV and hepatitis. The blood is then tested to be sure it is compatible with you in order to minimize the chance of a transfusion reaction. If you or a relative donates blood, this is often done in anticipation of surgery and is not appropriate for emergency situations. It takes many days to process the donated blood. RISKS AND COMPLICATIONS Although transfusion therapy is very safe and saves many lives, the main dangers of transfusion include:   Getting an infectious disease.  Developing a transfusion reaction. This is an allergic reaction to something in the blood you were given. Every precaution is taken to prevent this. The decision to have a blood transfusion has been considered carefully by your caregiver before blood is given. Blood is not given unless the benefits outweigh the risks.

## 2017-07-10 ENCOUNTER — Telehealth: Payer: Self-pay

## 2017-07-10 NOTE — Patient Instructions (Addendum)
Anna [name]  07/10/2017   Your procedure is scheduled on: 07/17/2017    Report to [facility] Main  Entrance Take [place]  elevators to 3rd floor to  [place] at   12 noon      Call this number if you have problems the morning of surgery [phone]    Remember: ONLY 1 PERSON MAY GO WITH YOU TO SHORT STAY TO GET  READY MORNING OF YOUR SURGERY.  Do not eat food or drink liquids :After Midnight.                 Eat a light diet the day before surgery  Examples include soups, toast, broths, yogurt and mashed potatoes.  Things to avoid include: raw fruits and vegetables, beans and carbonated beverages.              At 400pm the day before surgery begin drinking 2 bottles of Magnesium Citrate.  At 400pm also begin a Clear Liquid diet.  Remember to drink plenty of clear liquids.       Take these medicines the morning of surgery with A SIP OF WATER: Wellbutrin, Synthroid, Prilosec                                 You may not have any metal on your body including hair pins and              piercings  Do not wear jewelry, make-up, lotions, powders or perfumes, deodorant             Do not wear nail polish.  Do not shave  48 hours prior to surgery.  .   Do not bring valuables to the hospital. [place].  Contacts, dentures or bridgework may not be worn into surgery.  Leave suitcase in the car. After surgery it may be brought to your room.     Special Instructions: coughing and deep breathing exercises, leg exercises    CLEAR LIQUID DIET   Foods Allowed                                                                     Foods Excluded  Coffee and tea, regular and decaf                             liquids that you cannot  Plain Jell-O in any flavor                                             see through such as: Fruit ices (not with fruit pulp)                                     milk, soups,  orange  juice  Iced Popsicles                                    All solid food                                    Cranberry, grape and apple juices Sports drinks like Gatorade Lightly seasoned clear broth or consume(fat free) Sugar, honey syrup  Sample Menu Breakfast                                Lunch                                     Supper Cranberry juice                    Beef broth                            Chicken broth Jell-O                                     Grape juice                           Apple juice Coffee or tea                        Jell-O                                      Popsicle                                                Coffee or tea                        Coffee or tea  _____________________________________________________________________                Please read over the following fact sheets you were given: _____________________________________________________________________             [facility] - Preparing for Surgery Before surgery, you can play an important role.  Because skin is not sterile, your skin needs to be as free of germs as possible.  You can reduce the number of germs on your skin by washing with CHG (chlorahexidine gluconate) soap before surgery.  CHG is an antiseptic cleaner which kills germs and bonds with the skin to continue killing germs even after washing. Please DO NOT use if you have an allergy to CHG or antibacterial soaps.  If your skin becomes reddened/irritated stop using the CHG and inform your nurse when you arrive at Short Stay. Do not shave (including legs and underarms) for at least 48 hours prior to the first CHG shower.  You may shave your  face/neck. Please follow these instructions carefully:  1.  Shower with CHG Soap the night before surgery and the  morning of Surgery.  2.  If you choose to wash your hair, wash your hair first as usual with your  normal  shampoo.  3.  After you shampoo, rinse your hair and  body thoroughly to remove the  shampoo.                           4.  Use CHG as you would any other liquid soap.  You can apply chg directly  to the skin and wash                       Gently with a scrungie or clean washcloth.  5.  Apply the CHG Soap to your body ONLY FROM THE NECK DOWN.   Do not use on face/ open                           Wound or open sores. Avoid contact with eyes, ears mouth and genitals (private parts).                       Wash face,  Genitals (private parts) with your normal soap.             6.  Wash thoroughly, paying special attention to the area where your surgery  will be performed.  7.  Thoroughly rinse your body with warm water from the neck down.  8.  DO NOT shower/wash with your normal soap after using and rinsing off  the CHG Soap.                9.  Pat yourself dry with a clean towel.            10.  Wear clean pajamas.            11.  Place clean sheets on your bed the night of your first shower and do not  sleep with pets. Day of Surgery : Do not apply any lotions/deodorants the morning of surgery.  Please wear clean clothes to the hospital/surgery center.  FAILURE TO FOLLOW THESE INSTRUCTIONS MAY RESULT IN THE CANCELLATION OF YOUR SURGERY PATIENT SIGNATURE_________________________________  NURSE SIGNATURE__________________________________  ________________________________________________________________________  WHAT IS A BLOOD TRANSFUSION? Blood Transfusion Information  A transfusion is the replacement of blood or some of its parts. Blood is made up of multiple cells which provide different functions.  Red blood cells carry oxygen and are used for blood loss replacement.  White blood cells fight against infection.  Platelets control bleeding.  Plasma helps clot blood.  Other blood products are available for specialized needs, such as hemophilia or other clotting disorders. BEFORE THE TRANSFUSION  Who gives blood for transfusions?    Healthy volunteers who are fully evaluated to make sure their blood is safe. This is blood bank blood. Transfusion therapy is the safest it has ever been in the practice of medicine. Before blood is taken from a donor, a complete history is taken to make sure that person has no history of diseases nor engages in risky social behavior (examples are intravenous drug use or sexual activity with multiple partners). The donor's travel history is screened to minimize risk of transmitting infections, such as malaria. The donated blood is tested for signs of infectious diseases, such  as HIV and hepatitis. The blood is then tested to be sure it is compatible with you in order to minimize the chance of a transfusion reaction. If you or a relative donates blood, this is often done in anticipation of surgery and is not appropriate for emergency situations. It takes many days to process the donated blood. RISKS AND COMPLICATIONS Although transfusion therapy is very safe and saves many lives, the main dangers of transfusion include:   Getting an infectious disease.  Developing a transfusion reaction. This is an allergic reaction to something in the blood you were given. Every precaution is taken to prevent this. The decision to have a blood transfusion has been considered carefully by your caregiver before blood is given. Blood is not given unless the benefits outweigh the risks. AFTER THE TRANSFUSION  Right after receiving a blood transfusion, you will usually feel much better and more energetic. This is especially true if your red blood cells have gotten low (anemic). The transfusion raises the level of the red blood cells which carry oxygen, and this usually causes an energy increase.  The nurse administering the transfusion will monitor you carefully for complications. HOME CARE INSTRUCTIONS  No special instructions are needed after a transfusion. You may find your energy is better. Speak with your  caregiver about any limitations on activity for underlying diseases you may have. SEEK MEDICAL CARE IF:   Your condition is not improving after your transfusion.  You develop redness or irritation at the intravenous (IV) site. SEEK IMMEDIATE MEDICAL CARE IF:  Any of the following symptoms occur over the next 12 hours:  Shaking chills.  You have a temperature by mouth above 102 F (38.9 C), not controlled by medicine.  Chest, back, or muscle pain.  People around you feel you are not acting correctly or are confused.  Shortness of breath or difficulty breathing.  Dizziness and fainting.  You get a rash or develop hives.  You have a decrease in urine output.  Your urine turns a dark color or changes to pink, red, or brown. Any of the following symptoms occur over the next 10 days:  You have a temperature by mouth above 102 F (38.9 C), not controlled by medicine.  Shortness of breath.  Weakness after normal activity.  The white part of the eye turns yellow (jaundice).  You have a decrease in the amount of urine or are urinating less often.  Your urine turns a dark color or changes to pink, red, or brown. Document Released: 11/01/2000 Document Revised: 01/27/2012 Document Reviewed: 06/20/2008 ExitCare Patient Information 2014 [place].  _______________________________________________________________________  Incentive Spirometer  An incentive spirometer is a tool that can help keep your lungs clear and active. This tool measures how well you are filling your lungs with each breath. Taking long deep breaths may help reverse or decrease the chance of developing breathing (pulmonary) problems (especially infection) following:  A long period of time when you are unable to move or be active. BEFORE THE PROCEDURE   If the spirometer includes an indicator to show your best effort, your nurse or respiratory therapist will set it to a desired goal.  If possible, sit  up straight or lean slightly forward. Try not to slouch.  Hold the incentive spirometer in an upright position. INSTRUCTIONS FOR USE  1. Sit on the edge of your bed if possible, or sit up as far as you can in bed or on a chair. 2. Hold the incentive spirometer in  an upright position. 3. Breathe out normally. 4. Place the mouthpiece in your mouth and seal your lips tightly around it. 5. Breathe in slowly and as deeply as possible, raising the piston or the ball toward the top of the column. 6. Hold your breath for 3-5 seconds or for as long as possible. Allow the piston or ball to fall to the bottom of the column. 7. Remove the mouthpiece from your mouth and breathe out normally. 8. Rest for a few seconds and repeat Steps 1 through 7 at least 10 times every 1-2 hours when you are awake. Take your time and take a few normal breaths between deep breaths. 9. The spirometer may include an indicator to show your best effort. Use the indicator as a goal to work toward during each repetition. 10. After each set of 10 deep breaths, practice coughing to be sure your lungs are clear. If you have an incision (the cut made at the time of surgery), support your incision when coughing by placing a pillow or rolled up towels firmly against it. Once you are able to get out of bed, walk around indoors and cough well. You may stop using the incentive spirometer when instructed by your caregiver.  RISKS AND COMPLICATIONS  Take your time so you do not get dizzy or light-headed.  If you are in pain, you may need to take or ask for pain medication before doing incentive spirometry. It is harder to take a deep breath if you are having pain. AFTER USE  Rest and breathe slowly and easily.  It can be helpful to keep track of a log of your progress. Your caregiver can provide you with a simple table to help with this. If you are using the spirometer at home, follow these instructions: [place] IF:   You are  having difficultly using the spirometer.  You have trouble using the spirometer as often as instructed.  Your pain medication is not giving enough relief while using the spirometer.  You develop fever of 100.5 F (38.1 C) or higher. SEEK IMMEDIATE MEDICAL CARE IF:   You cough up bloody sputum that had not been present before.  You develop fever of 102 F (38.9 C) or greater.  You develop worsening pain at or near the incision site. MAKE SURE YOU:   Understand these instructions.  Will watch your condition.  Will get help right away if you are not doing well or get worse. Document Released: 03/17/2007 Document Revised: 01/27/2012 Document Reviewed: 05/18/2007 [facility] Patient Information 2014 [place], [place].   ________________________________________________________________________

## 2017-07-10 NOTE — Telephone Encounter (Signed)
Spoke with patient concerning upcoming appointment for.9/10. Per los

## 2017-07-11 ENCOUNTER — Encounter (HOSPITAL_COMMUNITY): Payer: Self-pay

## 2017-07-11 ENCOUNTER — Encounter (HOSPITAL_COMMUNITY)
Admission: RE | Admit: 2017-07-11 | Discharge: 2017-07-11 | Disposition: A | Discharge status: Home / Self Care | Payer: 59 | Source: Ambulatory Visit | Attending: Gynecologic Oncology | Admitting: Gynecologic Oncology

## 2017-07-11 DIAGNOSIS — Z79899 Other long term (current) drug therapy: Secondary | ICD-10-CM | POA: Insufficient documentation

## 2017-07-11 DIAGNOSIS — C799 Secondary malignant neoplasm of unspecified site: Secondary | ICD-10-CM | POA: Diagnosis not present

## 2017-07-11 DIAGNOSIS — R9431 Abnormal electrocardiogram [ECG] [EKG]: Secondary | ICD-10-CM | POA: Diagnosis not present

## 2017-07-11 DIAGNOSIS — Z8249 Family history of ischemic heart disease and other diseases of the circulatory system: Secondary | ICD-10-CM | POA: Diagnosis not present

## 2017-07-11 DIAGNOSIS — Z833 Family history of diabetes mellitus: Secondary | ICD-10-CM | POA: Insufficient documentation

## 2017-07-11 DIAGNOSIS — Z8041 Family history of malignant neoplasm of ovary: Secondary | ICD-10-CM | POA: Diagnosis not present

## 2017-07-11 DIAGNOSIS — I10 Essential (primary) hypertension: Secondary | ICD-10-CM | POA: Insufficient documentation

## 2017-07-11 DIAGNOSIS — Z01810 Encounter for preprocedural cardiovascular examination: Secondary | ICD-10-CM | POA: Diagnosis present

## 2017-07-11 DIAGNOSIS — Z01812 Encounter for preprocedural laboratory examination: Secondary | ICD-10-CM | POA: Insufficient documentation

## 2017-07-11 HISTORY — DX: Anxiety disorder, unspecified: F41.9

## 2017-07-11 HISTORY — DX: Diverticulosis of intestine, part unspecified, without perforation or abscess without bleeding: K57.90

## 2017-07-11 HISTORY — DX: Family history of other specified conditions: Z84.89

## 2017-07-11 HISTORY — DX: Sleep apnea, unspecified: G47.30

## 2017-07-11 HISTORY — DX: Pneumonia, unspecified organism: J18.9

## 2017-07-11 HISTORY — DX: Cardiac murmur, unspecified: R01.1

## 2017-07-11 LAB — COMPREHENSIVE METABOLIC PANEL
ALK PHOS: 69 U/L (ref 38–126)
ALT: 20 U/L (ref 14–54)
ANION GAP: 9 (ref 5–15)
AST: 21 U/L (ref 15–41)
Albumin: 3.8 g/dL (ref 3.5–5.0)
BUN: 14 mg/dL (ref 6–20)
CALCIUM: 9.2 mg/dL (ref 8.9–10.3)
CO2: 28 mmol/L (ref 22–32)
CREATININE: 0.8 mg/dL (ref 0.44–1.00)
Chloride: 102 mmol/L (ref 101–111)
Glucose, Bld: 102 mg/dL — ABNORMAL HIGH (ref 65–99)
Potassium: 3.8 mmol/L (ref 3.5–5.1)
SODIUM: 139 mmol/L (ref 135–145)
TOTAL PROTEIN: 7.7 g/dL (ref 6.5–8.1)
Total Bilirubin: 0.5 mg/dL (ref 0.3–1.2)

## 2017-07-11 LAB — URINALYSIS, ROUTINE W REFLEX MICROSCOPIC
BILIRUBIN URINE: NEGATIVE
GLUCOSE, UA: NEGATIVE mg/dL
HGB URINE DIPSTICK: NEGATIVE
KETONES UR: NEGATIVE mg/dL
Leukocytes, UA: NEGATIVE
Nitrite: NEGATIVE
PH: 6 (ref 5.0–8.0)
Protein, ur: NEGATIVE mg/dL
SPECIFIC GRAVITY, URINE: 1.025 (ref 1.005–1.030)

## 2017-07-11 LAB — CBC
HCT: 38.3 % (ref 36.0–46.0)
HEMOGLOBIN: 12.6 g/dL (ref 12.0–15.0)
MCH: 30.4 pg (ref 26.0–34.0)
MCHC: 32.9 g/dL (ref 30.0–36.0)
MCV: 92.5 fL (ref 78.0–100.0)
Platelets: 300 10*3/uL (ref 150–400)
RBC: 4.14 MIL/uL (ref 3.87–5.11)
RDW: 13.7 % (ref 11.5–15.5)
WBC: 7.7 10*3/uL (ref 4.0–10.5)

## 2017-07-11 LAB — ABO/RH: ABO/RH(D): A NEG

## 2017-07-11 NOTE — Progress Notes (Signed)
Final ekg 07/11/17 in epic

## 2017-07-11 NOTE — Progress Notes (Signed)
CXR 01/13/17 epic  07/04/17 ptt,pt,cbc 06/23/17 ua bmp epic

## 2017-07-17 ENCOUNTER — Inpatient Hospital Stay (HOSPITAL_COMMUNITY)
Admission: AD | Admit: 2017-07-17 | Discharge: 2017-07-20 | DRG: 737 | Disposition: A | Discharge status: Home / Self Care | Payer: 59 | Source: Ambulatory Visit | Attending: Gynecologic Oncology | Admitting: Gynecologic Oncology

## 2017-07-17 ENCOUNTER — Ambulatory Visit (HOSPITAL_COMMUNITY): Payer: 59 | Admitting: Anesthesiology

## 2017-07-17 ENCOUNTER — Encounter (HOSPITAL_COMMUNITY)
Admission: AD | Disposition: A | Discharge status: Home / Self Care | Payer: Self-pay | Source: Ambulatory Visit | Attending: Gynecologic Oncology

## 2017-07-17 ENCOUNTER — Encounter (HOSPITAL_COMMUNITY): Payer: Self-pay | Admitting: *Deleted

## 2017-07-17 DIAGNOSIS — Z823 Family history of stroke: Secondary | ICD-10-CM | POA: Diagnosis not present

## 2017-07-17 DIAGNOSIS — C800 Disseminated malignant neoplasm, unspecified: Secondary | ICD-10-CM

## 2017-07-17 DIAGNOSIS — Z91030 Bee allergy status: Secondary | ICD-10-CM

## 2017-07-17 DIAGNOSIS — Z820 Family history of epilepsy and other diseases of the nervous system: Secondary | ICD-10-CM | POA: Diagnosis not present

## 2017-07-17 DIAGNOSIS — G4730 Sleep apnea, unspecified: Secondary | ICD-10-CM | POA: Diagnosis present

## 2017-07-17 DIAGNOSIS — Z8249 Family history of ischemic heart disease and other diseases of the circulatory system: Secondary | ICD-10-CM

## 2017-07-17 DIAGNOSIS — E039 Hypothyroidism, unspecified: Secondary | ICD-10-CM | POA: Diagnosis present

## 2017-07-17 DIAGNOSIS — Z803 Family history of malignant neoplasm of breast: Secondary | ICD-10-CM

## 2017-07-17 DIAGNOSIS — F419 Anxiety disorder, unspecified: Secondary | ICD-10-CM | POA: Diagnosis present

## 2017-07-17 DIAGNOSIS — Z833 Family history of diabetes mellitus: Secondary | ICD-10-CM | POA: Diagnosis not present

## 2017-07-17 DIAGNOSIS — I10 Essential (primary) hypertension: Secondary | ICD-10-CM | POA: Diagnosis present

## 2017-07-17 DIAGNOSIS — K660 Peritoneal adhesions (postprocedural) (postinfection): Secondary | ICD-10-CM | POA: Diagnosis present

## 2017-07-17 DIAGNOSIS — E7800 Pure hypercholesterolemia, unspecified: Secondary | ICD-10-CM | POA: Diagnosis present

## 2017-07-17 DIAGNOSIS — F329 Major depressive disorder, single episode, unspecified: Secondary | ICD-10-CM | POA: Diagnosis present

## 2017-07-17 DIAGNOSIS — K760 Fatty (change of) liver, not elsewhere classified: Secondary | ICD-10-CM | POA: Diagnosis present

## 2017-07-17 DIAGNOSIS — K567 Ileus, unspecified: Secondary | ICD-10-CM | POA: Diagnosis not present

## 2017-07-17 DIAGNOSIS — K219 Gastro-esophageal reflux disease without esophagitis: Secondary | ICD-10-CM | POA: Diagnosis present

## 2017-07-17 DIAGNOSIS — Z8349 Family history of other endocrine, nutritional and metabolic diseases: Secondary | ICD-10-CM

## 2017-07-17 DIAGNOSIS — R1032 Left lower quadrant pain: Secondary | ICD-10-CM | POA: Diagnosis present

## 2017-07-17 DIAGNOSIS — Z8261 Family history of arthritis: Secondary | ICD-10-CM | POA: Diagnosis not present

## 2017-07-17 DIAGNOSIS — C569 Malignant neoplasm of unspecified ovary: Principal | ICD-10-CM | POA: Diagnosis present

## 2017-07-17 HISTORY — PX: LAPAROTOMY: SHX154

## 2017-07-17 HISTORY — PX: LAPAROSCOPY: SHX197

## 2017-07-17 HISTORY — PX: OMENTECTOMY: SHX5985

## 2017-07-17 LAB — TYPE AND SCREEN
ABO/RH(D): A NEG
ANTIBODY SCREEN: NEGATIVE

## 2017-07-17 SURGERY — LAPAROSCOPY, DIAGNOSTIC
Anesthesia: General

## 2017-07-17 MED ORDER — MIDAZOLAM HCL 2 MG/2ML IJ SOLN
INTRAMUSCULAR | Status: DC | PRN
  Administered 2017-07-17: 2 mg via INTRAVENOUS

## 2017-07-17 MED ORDER — HYDROMORPHONE HCL-NACL 0.5-0.9 MG/ML-% IV SOSY: 0.2500 mg | PREFILLED_SYRINGE | INTRAVENOUS | Status: DC | PRN

## 2017-07-17 MED ORDER — MEPERIDINE HCL 50 MG/ML IJ SOLN: 6.2500 mg | INTRAMUSCULAR | Status: DC | PRN

## 2017-07-17 MED ORDER — MIDAZOLAM HCL 2 MG/2ML IJ SOLN
INTRAMUSCULAR | Status: AC
  Filled 2017-07-17: qty 2

## 2017-07-17 MED ORDER — DEXAMETHASONE SODIUM PHOSPHATE 10 MG/ML IJ SOLN
INTRAMUSCULAR | Status: DC | PRN
  Administered 2017-07-17: 10 mg via INTRAVENOUS

## 2017-07-17 MED ORDER — ENOXAPARIN SODIUM 40 MG/0.4ML ~~LOC~~ SOLN
40.0000 mg | SUBCUTANEOUS | Status: AC
  Administered 2017-07-17: 40 mg via SUBCUTANEOUS
  Filled 2017-07-17: qty 0.4

## 2017-07-17 MED ORDER — ROCURONIUM BROMIDE 50 MG/5ML IV SOSY
PREFILLED_SYRINGE | INTRAVENOUS | Status: AC
  Filled 2017-07-17: qty 5

## 2017-07-17 MED ORDER — HYDROMORPHONE HCL 1 MG/ML IJ SOLN
INTRAMUSCULAR | Status: DC | PRN
  Administered 2017-07-17 (×4): 0.5 mg via INTRAVENOUS

## 2017-07-17 MED ORDER — HYDROCHLOROTHIAZIDE 12.5 MG PO CAPS
12.5000 mg | ORAL_CAPSULE | Freq: Every day | ORAL | Status: DC
  Administered 2017-07-18 – 2017-07-20 (×3): 12.5 mg via ORAL
  Filled 2017-07-17 (×3): qty 1

## 2017-07-17 MED ORDER — BUPIVACAINE HCL 0.25 % IJ SOLN
INTRAMUSCULAR | Status: DC | PRN
  Administered 2017-07-17: 20 mL

## 2017-07-17 MED ORDER — HYDROMORPHONE HCL-NACL 0.5-0.9 MG/ML-% IV SOSY
0.5000 mg | PREFILLED_SYRINGE | INTRAVENOUS | Status: DC | PRN
  Administered 2017-07-18: 0.5 mg via INTRAVENOUS
  Filled 2017-07-17: qty 1

## 2017-07-17 MED ORDER — ENOXAPARIN (LOVENOX) PATIENT EDUCATION KIT
PACK | Freq: Once | Status: AC
  Administered 2017-07-18: 17:00:00
  Filled 2017-07-17: qty 1

## 2017-07-17 MED ORDER — PROPOFOL 10 MG/ML IV BOLUS
INTRAVENOUS | Status: DC | PRN
  Administered 2017-07-17: 160 mg via INTRAVENOUS

## 2017-07-17 MED ORDER — OXYCODONE HCL 5 MG PO TABS: 5.0000 mg | ORAL_TABLET | ORAL | Status: DC | PRN

## 2017-07-17 MED ORDER — KETOROLAC TROMETHAMINE 30 MG/ML IJ SOLN: 30.0000 mg | Freq: Once | INTRAMUSCULAR | Status: DC | PRN

## 2017-07-17 MED ORDER — BUPIVACAINE LIPOSOME 1.3 % IJ SUSP
20.0000 mL | Freq: Once | INTRAMUSCULAR | Status: AC
  Administered 2017-07-17: 20 mL
  Filled 2017-07-17: qty 20

## 2017-07-17 MED ORDER — HYDROMORPHONE HCL 2 MG/ML IJ SOLN
INTRAMUSCULAR | Status: AC
Start: 2017-07-17 — End: 2017-07-17
  Filled 2017-07-17: qty 1

## 2017-07-17 MED ORDER — PROMETHAZINE HCL 25 MG/ML IJ SOLN: 6.2500 mg | INTRAMUSCULAR | Status: DC | PRN

## 2017-07-17 MED ORDER — SENNOSIDES-DOCUSATE SODIUM 8.6-50 MG PO TABS
2.0000 | ORAL_TABLET | Freq: Every day | ORAL | Status: DC
  Administered 2017-07-17 – 2017-07-19 (×3): 2 via ORAL
  Filled 2017-07-17 (×3): qty 2

## 2017-07-17 MED ORDER — BUPROPION HCL ER (XL) 300 MG PO TB24
300.0000 mg | ORAL_TABLET | Freq: Every day | ORAL | Status: DC
  Administered 2017-07-18 – 2017-07-20 (×3): 300 mg via ORAL
  Filled 2017-07-17 (×3): qty 1

## 2017-07-17 MED ORDER — KCL IN DEXTROSE-NACL 20-5-0.45 MEQ/L-%-% IV SOLN
INTRAVENOUS | Status: DC
Start: 2017-07-17 — End: 2017-07-20
  Administered 2017-07-17: 20:00:00 via INTRAVENOUS
  Filled 2017-07-17 (×2): qty 1000

## 2017-07-17 MED ORDER — ONDANSETRON HCL 4 MG/2ML IJ SOLN
INTRAMUSCULAR | Status: AC
  Filled 2017-07-17: qty 2

## 2017-07-17 MED ORDER — NON FORMULARY: 1.0000 [IU] | Freq: Three times a day (TID) | Status: DC

## 2017-07-17 MED ORDER — TRAMADOL HCL 50 MG PO TABS
100.0000 mg | ORAL_TABLET | Freq: Four times a day (QID) | ORAL | Status: DC
  Administered 2017-07-17 – 2017-07-20 (×11): 100 mg via ORAL
  Filled 2017-07-17 (×11): qty 2

## 2017-07-17 MED ORDER — HYDROMORPHONE HCL-NACL 0.5-0.9 MG/ML-% IV SOSY
0.2500 mg | PREFILLED_SYRINGE | INTRAVENOUS | Status: DC | PRN
  Administered 2017-07-17: 0.5 mg via INTRAVENOUS

## 2017-07-17 MED ORDER — SUFENTANIL CITRATE 50 MCG/ML IV SOLN
INTRAVENOUS | Status: AC
  Filled 2017-07-17: qty 1

## 2017-07-17 MED ORDER — PROPOFOL 10 MG/ML IV BOLUS
INTRAVENOUS | Status: AC
  Filled 2017-07-17: qty 20

## 2017-07-17 MED ORDER — LIDOCAINE 2% (20 MG/ML) 5 ML SYRINGE
INTRAMUSCULAR | Status: DC | PRN
  Administered 2017-07-17: 100 mg via INTRAVENOUS

## 2017-07-17 MED ORDER — SUGAMMADEX SODIUM 200 MG/2ML IV SOLN
INTRAVENOUS | Status: DC | PRN
  Administered 2017-07-17: 160 mg via INTRAVENOUS

## 2017-07-17 MED ORDER — BENAZEPRIL HCL 20 MG PO TABS
20.0000 mg | ORAL_TABLET | Freq: Once | ORAL | Status: AC
  Administered 2017-07-17: 20 mg via ORAL
  Filled 2017-07-17: qty 1

## 2017-07-17 MED ORDER — SUGAMMADEX SODIUM 200 MG/2ML IV SOLN
INTRAVENOUS | Status: AC
  Filled 2017-07-17: qty 2

## 2017-07-17 MED ORDER — SUFENTANIL CITRATE 50 MCG/ML IV SOLN
INTRAVENOUS | Status: DC | PRN
  Administered 2017-07-17 (×5): 10 ug via INTRAVENOUS
  Administered 2017-07-17: 20 ug via INTRAVENOUS
  Administered 2017-07-17: 10 ug via INTRAVENOUS

## 2017-07-17 MED ORDER — DEXTROSE 5 % IV SOLN
2.0000 g | INTRAVENOUS | Status: AC
  Administered 2017-07-17 (×2): 2 g via INTRAVENOUS
  Filled 2017-07-17 (×2): qty 2

## 2017-07-17 MED ORDER — ENSURE ENLIVE PO LIQD
237.0000 mL | Freq: Two times a day (BID) | ORAL | Status: DC
  Administered 2017-07-18 (×2): 237 mL via ORAL

## 2017-07-17 MED ORDER — KETOROLAC TROMETHAMINE 30 MG/ML IJ SOLN
INTRAMUSCULAR | Status: AC
  Filled 2017-07-17: qty 1

## 2017-07-17 MED ORDER — ONDANSETRON HCL 4 MG PO TABS
4.0000 mg | ORAL_TABLET | Freq: Four times a day (QID) | ORAL | Status: DC | PRN
  Administered 2017-07-18: 4 mg via ORAL
  Filled 2017-07-17: qty 1

## 2017-07-17 MED ORDER — LEVOTHYROXINE SODIUM 88 MCG PO TABS
88.0000 ug | ORAL_TABLET | Freq: Every day | ORAL | Status: DC
  Administered 2017-07-18 – 2017-07-20 (×3): 88 ug via ORAL
  Filled 2017-07-17 (×3): qty 1

## 2017-07-17 MED ORDER — DEXAMETHASONE SODIUM PHOSPHATE 10 MG/ML IJ SOLN
INTRAMUSCULAR | Status: AC
  Filled 2017-07-17: qty 1

## 2017-07-17 MED ORDER — LABETALOL HCL 5 MG/ML IV SOLN
10.0000 mg | Freq: Once | INTRAVENOUS | Status: AC
  Administered 2017-07-17: 10 mg via INTRAVENOUS

## 2017-07-17 MED ORDER — KETOROLAC TROMETHAMINE 30 MG/ML IJ SOLN
30.0000 mg | Freq: Once | INTRAMUSCULAR | Status: DC | PRN
  Administered 2017-07-17: 30 mg via INTRAVENOUS

## 2017-07-17 MED ORDER — SODIUM CHLORIDE 0.9 % IJ SOLN
INTRAMUSCULAR | Status: AC
Start: 2017-07-17 — End: 2017-07-17
  Filled 2017-07-17: qty 50

## 2017-07-17 MED ORDER — LACTATED RINGERS IV SOLN
INTRAVENOUS | Status: DC
  Administered 2017-07-17 (×3): via INTRAVENOUS

## 2017-07-17 MED ORDER — 0.9 % SODIUM CHLORIDE (POUR BTL) OPTIME
TOPICAL | Status: DC | PRN
  Administered 2017-07-17: 2000 mL

## 2017-07-17 MED ORDER — LIDOCAINE 2% (20 MG/ML) 5 ML SYRINGE
INTRAMUSCULAR | Status: AC
  Filled 2017-07-17: qty 5

## 2017-07-17 MED ORDER — BENAZEPRIL-HYDROCHLOROTHIAZIDE 20-12.5 MG PO TABS: 1.0000 | ORAL_TABLET | Freq: Every day | ORAL | Status: DC

## 2017-07-17 MED ORDER — ROCURONIUM BROMIDE 10 MG/ML (PF) SYRINGE
PREFILLED_SYRINGE | INTRAVENOUS | Status: DC | PRN
  Administered 2017-07-17: 50 mg via INTRAVENOUS
  Administered 2017-07-17: 20 mg via INTRAVENOUS

## 2017-07-17 MED ORDER — LABETALOL HCL 5 MG/ML IV SOLN
INTRAVENOUS | Status: AC
  Filled 2017-07-17: qty 4

## 2017-07-17 MED ORDER — SODIUM CHLORIDE 0.9 % IJ SOLN
INTRAMUSCULAR | Status: DC | PRN
  Administered 2017-07-17: 20 mL

## 2017-07-17 MED ORDER — SUCCINYLCHOLINE CHLORIDE 200 MG/10ML IV SOSY
PREFILLED_SYRINGE | INTRAVENOUS | Status: AC
  Filled 2017-07-17: qty 10

## 2017-07-17 MED ORDER — ONDANSETRON HCL 4 MG/2ML IJ SOLN
INTRAMUSCULAR | Status: DC | PRN
  Administered 2017-07-17: 4 mg via INTRAVENOUS

## 2017-07-17 MED ORDER — ACETAMINOPHEN 500 MG PO TABS
1000.0000 mg | ORAL_TABLET | Freq: Four times a day (QID) | ORAL | Status: DC
  Administered 2017-07-17 – 2017-07-20 (×12): 1000 mg via ORAL
  Filled 2017-07-17 (×12): qty 2

## 2017-07-17 MED ORDER — ROSUVASTATIN CALCIUM 5 MG PO TABS
5.0000 mg | ORAL_TABLET | Freq: Every day | ORAL | Status: DC
  Administered 2017-07-18 – 2017-07-20 (×3): 5 mg via ORAL
  Filled 2017-07-17 (×3): qty 1

## 2017-07-17 MED ORDER — ENOXAPARIN SODIUM 40 MG/0.4ML ~~LOC~~ SOLN
40.0000 mg | SUBCUTANEOUS | Status: DC
  Administered 2017-07-18 – 2017-07-20 (×3): 40 mg via SUBCUTANEOUS
  Filled 2017-07-17 (×4): qty 0.4

## 2017-07-17 MED ORDER — BENAZEPRIL HCL 10 MG PO TABS
20.0000 mg | ORAL_TABLET | Freq: Every day | ORAL | Status: DC
  Administered 2017-07-18 – 2017-07-20 (×3): 20 mg via ORAL
  Filled 2017-07-17 (×3): qty 2

## 2017-07-17 MED ORDER — ONDANSETRON HCL 4 MG/2ML IJ SOLN
4.0000 mg | Freq: Four times a day (QID) | INTRAMUSCULAR | Status: DC | PRN
  Administered 2017-07-18 – 2017-07-19 (×4): 4 mg via INTRAVENOUS
  Filled 2017-07-17 (×4): qty 2

## 2017-07-17 MED ORDER — SODIUM CHLORIDE 0.9 % IJ SOLN
INTRAMUSCULAR | Status: AC
  Filled 2017-07-17: qty 10

## 2017-07-17 MED ORDER — IBUPROFEN 200 MG PO TABS
600.0000 mg | ORAL_TABLET | Freq: Four times a day (QID) | ORAL | Status: DC
  Administered 2017-07-18 – 2017-07-20 (×10): 600 mg via ORAL
  Filled 2017-07-17 (×10): qty 3

## 2017-07-17 MED ORDER — BUPIVACAINE HCL (PF) 0.25 % IJ SOLN
INTRAMUSCULAR | Status: AC
  Filled 2017-07-17: qty 30

## 2017-07-17 MED ORDER — HYDROMORPHONE HCL-NACL 0.5-0.9 MG/ML-% IV SOSY
PREFILLED_SYRINGE | INTRAVENOUS | Status: AC
Start: 2017-07-17 — End: 2017-07-18
  Filled 2017-07-17: qty 1

## 2017-07-17 MED ORDER — PREGABALIN 75 MG PO CAPS
75.0000 mg | ORAL_CAPSULE | Freq: Two times a day (BID) | ORAL | Status: DC
  Administered 2017-07-18 – 2017-07-20 (×5): 75 mg via ORAL
  Filled 2017-07-17 (×5): qty 1

## 2017-07-17 SURGICAL SUPPLY — 80 items
ATTRACTOMAT 16X20 MAGNETIC DRP (DRAPES) ×3 IMPLANT
BANDAGE ADH SHEER 1  50/CT (GAUZE/BANDAGES/DRESSINGS) IMPLANT
BLADE EXTENDED COATED 6.5IN (ELECTRODE) ×3 IMPLANT
CABLE HIGH FREQUENCY MONO STRZ (ELECTRODE) IMPLANT
CELLS DAT CNTRL 66122 CELL SVR (MISCELLANEOUS) IMPLANT
CHLORAPREP W/TINT 26ML (MISCELLANEOUS) ×3 IMPLANT
CLIP VESOCCLUDE LG 6/CT (CLIP) IMPLANT
CLIP VESOCCLUDE MED 6/CT (CLIP) IMPLANT
CLIP VESOCCLUDE MED LG 6/CT (CLIP) IMPLANT
CONT SPEC 4OZ CLIKSEAL STRL BL (MISCELLANEOUS) IMPLANT
COVER SURGICAL LIGHT HANDLE (MISCELLANEOUS) ×3 IMPLANT
DERMABOND ADVANCED (GAUZE/BANDAGES/DRESSINGS) ×1
DERMABOND ADVANCED .7 DNX12 (GAUZE/BANDAGES/DRESSINGS) ×2 IMPLANT
DRAPE INCISE IOBAN 66X45 STRL (DRAPES) ×3 IMPLANT
DRAPE UTILITY XL STRL (DRAPES) IMPLANT
DRAPE WARM FLUID 44X44 (DRAPE) ×3 IMPLANT
DRSG OPSITE POSTOP 4X12 (GAUZE/BANDAGES/DRESSINGS) ×3 IMPLANT
ELECT PENCIL ROCKER SW 15FT (MISCELLANEOUS) IMPLANT
ELECT REM PT RETURN 15FT ADLT (MISCELLANEOUS) ×3 IMPLANT
GAUZE SPONGE 4X4 16PLY XRAY LF (GAUZE/BANDAGES/DRESSINGS) IMPLANT
GLOVE BIO SURGEON STRL SZ 6 (GLOVE) ×6 IMPLANT
GLOVE BIO SURGEON STRL SZ 6.5 (GLOVE) ×6 IMPLANT
GOWN STRL NON-REIN LRG LVL3 (GOWN DISPOSABLE) IMPLANT
GOWN STRL REUS W/ TWL LRG LVL3 (GOWN DISPOSABLE) ×4 IMPLANT
GOWN STRL REUS W/TWL LRG LVL3 (GOWN DISPOSABLE) ×2
HANDLE SUCTION POOLE (INSTRUMENTS) IMPLANT
HEMOSTAT ARISTA ABSORB 3G PWDR (MISCELLANEOUS) IMPLANT
HEMOSTAT SURGICEL 4X8 (HEMOSTASIS) IMPLANT
HOLDER FOLEY CATH W/STRAP (MISCELLANEOUS) ×3 IMPLANT
IRRIG SUCT STRYKERFLOW 2 WTIP (MISCELLANEOUS)
IRRIGATION SUCT STRKRFLW 2 WTP (MISCELLANEOUS) IMPLANT
KIT BASIN OR (CUSTOM PROCEDURE TRAY) ×3 IMPLANT
LIGASURE IMPACT 36 18CM CVD LR (INSTRUMENTS) ×3 IMPLANT
LOOP VESSEL MAXI BLUE (MISCELLANEOUS) IMPLANT
MANIPULATOR UTERINE 4.5 ZUMI (MISCELLANEOUS) IMPLANT
NEEDLE HYPO 22GX1.5 SAFETY (NEEDLE) ×6 IMPLANT
NS IRRIG 1000ML POUR BTL (IV SOLUTION) IMPLANT
PACK GENERAL/GYN (CUSTOM PROCEDURE TRAY) ×3 IMPLANT
PAD POSITIONING PINK XL (MISCELLANEOUS) ×3 IMPLANT
POUCH SPECIMEN RETRIEVAL 10MM (ENDOMECHANICALS) IMPLANT
RELOAD PROXIMATE 75MM BLUE (ENDOMECHANICALS) IMPLANT
RELOAD PROXIMATE TA60MM BLUE (ENDOMECHANICALS) IMPLANT
RETRACTOR WND ALEXIS 25 LRG (MISCELLANEOUS) IMPLANT
RTRCTR WOUND ALEXIS 18CM MED (MISCELLANEOUS)
RTRCTR WOUND ALEXIS 25CM LRG (MISCELLANEOUS)
SCISSORS LAP 5X35 DISP (ENDOMECHANICALS) IMPLANT
SHEET LAVH (DRAPES) ×3 IMPLANT
SPOGE SURGIFLO 8M (HEMOSTASIS)
SPONGE LAP 18X18 X RAY DECT (DISPOSABLE) IMPLANT
SPONGE SURGIFLO 8M (HEMOSTASIS) IMPLANT
STAPLER GUN LINEAR PROX 60 (STAPLE) IMPLANT
STAPLER PROXIMATE 75MM BLUE (STAPLE) IMPLANT
STAPLER VISISTAT 35W (STAPLE) IMPLANT
SUCTION POOLE HANDLE (INSTRUMENTS)
SUT MNCRL AB 4-0 PS2 18 (SUTURE) ×6 IMPLANT
SUT PDS AB 1 TP1 96 (SUTURE) ×6 IMPLANT
SUT SILK 3 0 SH CR/8 (SUTURE) IMPLANT
SUT VIC AB 0 CT1 36 (SUTURE) ×12 IMPLANT
SUT VIC AB 2-0 CT1 36 (SUTURE) ×6 IMPLANT
SUT VIC AB 2-0 CT2 27 (SUTURE) ×21 IMPLANT
SUT VIC AB 2-0 SH 27 (SUTURE) ×3
SUT VIC AB 2-0 SH 27X BRD (SUTURE) ×6 IMPLANT
SUT VIC AB 3-0 CTX 36 (SUTURE) ×6 IMPLANT
SUT VIC AB 3-0 PS2 18 (SUTURE)
SUT VIC AB 3-0 PS2 18XBRD (SUTURE) IMPLANT
SUT VIC AB 3-0 SH 18 (SUTURE) IMPLANT
SUT VIC AB 3-0 SH 27 (SUTURE) ×4
SUT VIC AB 3-0 SH 27X BRD (SUTURE) ×8 IMPLANT
SYR 30ML LL (SYRINGE) ×6 IMPLANT
TOWEL OR 17X26 10 PK STRL BLUE (TOWEL DISPOSABLE) ×3 IMPLANT
TOWEL OR NON WOVEN STRL DISP B (DISPOSABLE) ×3 IMPLANT
TRAY FOLEY BAG SILVER LF 14FR (CATHETERS) IMPLANT
TRAY FOLEY W/METER SILVER 16FR (SET/KITS/TRAYS/PACK) ×3 IMPLANT
TRAY LAPAROSCOPIC (CUSTOM PROCEDURE TRAY) IMPLANT
TROCAR XCEL 12X100 BLDLESS (ENDOMECHANICALS) IMPLANT
TROCAR XCEL BLUNT TIP 100MML (ENDOMECHANICALS) IMPLANT
TUBING INSUF HEATED (TUBING) ×3 IMPLANT
TUBING NON-CON 1/4 X 20 CONN (TUBING) IMPLANT
UNDERPAD 30X30 (UNDERPADS AND DIAPERS) ×3 IMPLANT
YANKAUER SUCT BULB TIP NO VENT (SUCTIONS) IMPLANT

## 2017-07-17 NOTE — Progress Notes (Signed)
B/P med given with sip of sprite. Tolerated well

## 2017-07-17 NOTE — Op Note (Signed)
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  Surgeon: Thereasa Solo, MD.  Assistant Surgeon: Lahoma Crocker, M.D. Assistant: (an MD assistant was necessary for tissue manipulation, retraction and positioning due to the complexity of the case and hospital policies).   Specimens: Uterus, Bilateral tubes / ovaries, omentum.    Estimated Blood Loss: 350 mL.    Urine [id number]:191 cc  Complications: None.   Operative Findings: 10cm omental plaque/cake densely adherent to anterior lower abdominal peritoneum. Plaque of tumor (thin) coating left ovarian fossa with sigmoid somewhat adherent though easily separated. No disease on intestines. Minimal nodularity on left ovary and fallopian tube. Small peritoneal studding on diaphragm. No other visible or palpable tumor.     This represented an optimal cytoreduction (R1) with thin tumor plaque on posterior left peritoneum the only gross visible disease remaining.   Procedure:   The patient was seen in the Holding Room. The risks, benefits, complications, treatment options, and expected outcomes were discussed with the patient.  The patient concurred with the proposed plan, giving informed consent.   The patient was  identified as [employer]  and the procedure verified as diagnostic laparoscopy, TAH BSO, omentectomy, tumor debulking. A Time Out was held and the above information confirmed upon entry to the operating room.  After induction of anesthesia, the patient was draped and prepped in the usual sterile manner.  She was prepped and draped in the normal sterile fashion in the dorsal lithotomy position in padded Allen stirrups with good attention paid to support of the lower back  and lower extremities. Arms were padded and tucked at her sides. A Foley catheter was placed to gravity.  A 52mm incision was made in the left upper quadrant and a 54mm optiview scope and port were placed through this into the peritoneal cavity. The above stated findings were noted and because it was apparent that she was a candidate for optimal cytoreduction, the decision was made to convert to laparotomy.   A midline vertical incision was made and carried through the subcutaneous tissue to the fascia. The fascial incision was made and extended superiorally. The rectus muscles were separated. The peritoneum was identified and entered. Peritoneal incision was extended longitudinally.  The abdominal cavity was entered sharply and without incident. A Bookwalter retractor was then placed. A survey of the abdomen and pelvis revealed the above findings, see above.  The omental cake was dissected free from the transverse colon from the hepatic flexure to the splenic flexure using sharp metzenbaum scissor dissection. The lesser sac was entered. The tumor cake was separated from the mesentery of the transverse colon. The short gastric vessels were sealed with ligasure and the infragastric omentum was separated from the greater curvature of the stomach. The distal omental cake was then carefully dissected from the anterior abdominal wall removing all bulky tumor. Hemostasis was confirmed. The colon was closely inspected and was noted to be intact and hemostatic.   After packing the small bowel into the upper abdomen, we performed the right salpingo-oophorectomy by entering the  pelvic sidewall just posterior to the right round ligament. The pararectal space was developed and the retroperitoneum developed up to the level of the common iliac artery.  The course of the ureter was identified with ease. The right IP was then skeletonized, and sealed  and cut with biploar. The ovary was separated from its peritoneal  attachments with the bovie with visualization of the ureter at all times.   The sigmoid colon was dissected from its tumor attachments to the left ovary using sharp dissection. The left retroperitoneal peritoneum was entered parallel to the sigmoid colon attachments and the left ureter was identified in the left retroperitoneal space. Using sharp and monopolar dissection, the left tube and ovary were freed from their peritoneal adhesions to the pelvis and sigmoid colon. The IP ligament was sealed with ligasure. The bladder flap was developed anteriorally with care to incorporate the tumor plaques on the peritoneum with the specimen. Bleeding vessels were made hemostatic with 3-0 vicryl interrupted sutures.   The uterine vessels were clamped bilaterally at the uterine isthmus with curved Heaney clamps then the pedicles were transected and suture ligated. Successive passes of straight Heaney clamps were used down the cardinal ligaments bilaterally with each pass medial to the prior. The pedicles were sharply transected and made hemostatic with suture. The bladder was ensured to be below the cervicovaginal junction, then curved clamps were passed across the cervicovaginal junction and the uterus was sharply transected. The vaginal cuff was closed with 0-vicryl on interrupted figure of 8 suture. The peritoneal cavity was irrigated and hemostasis was confirmed at all surgical sites.  Residual tumor was present at the posterior peritoneum in the pelvis (thin granular rind).   The fascia was reapproximated with 0 looped PDS using a total of two sutures. The subcutaneous layer was then irrigated copiously.  Exparel long acting local anesthetic was infiltrated into the subcutaneous tissues. The skin was closed with subcuticular suture. The patient tolerated the procedure well.   Sponge, lap and needle counts were correct x 2.   Donaciano Eva, MD

## 2017-07-17 NOTE — Transfer of Care (Signed)
Immediate Anesthesia Transfer of Care Note  Patient: Anna [name]  Procedure(s) Performed: Procedure(s): LAPAROSCOPY DIAGNOSTIC (N/A) EXPLORATORY LAPAROTOMY (N/A) HYSTERECTOMY ABDOMINAL WITH BILATERAL SALPINGO-OOPHORECTOMY (Bilateral) OMENTECTOMY WITH RADICAL TUMOR DEBULKING (N/A)  Patient Location: PACU  Anesthesia Type:General  Level of Consciousness: awake  Airway & Oxygen Therapy: Patient Spontanous Breathing and Patient connected to face mask oxygen  Post-op Assessment: Report given to RN and Post -op Vital signs reviewed and stable  Post vital signs: Reviewed and stable  Last Vitals:  Vitals:   07/17/17 1007  BP: (!) 140/91  Pulse: 95  Resp: 18  Temp: 37.1 C  SpO2: 97%    Last Pain:  Vitals:   07/17/17 1020  TempSrc:   PainSc: 0-No pain      Patients Stated Pain Goal: 3 (35/82/51 8984)  Complications: No apparent anesthesia complications

## 2017-07-17 NOTE — Progress Notes (Signed)
Remains very sedated, but easily aroused. Increased O2 to 4L/Min via [place]

## 2017-07-17 NOTE — Interval H&P Note (Signed)
History and Physical Interval Note:  07/17/2017 10:50 AM  Anna [name]  has presented today for surgery, with the diagnosis of Omental Thickening  The various methods of treatment have been discussed with the patient and family. After consideration of risks, benefits and other options for treatment, the patient has consented to  Procedure(s): LAPAROSCOPY DIAGNOSTIC (N/A) POSSIBLE EXPLORATORY LAPAROTOMY (N/A) POSSIBLE HYSTERECTOMY ABDOMINAL WITH BILATERAL SALPINGO-OOPHORECTOMY (Bilateral) POSSIBLE OMENTECTOMY WITH POSSIBLE TUMOR DEBULKING (N/A) as a surgical intervention .  The patient's history has been reviewed, patient examined, no change in status, stable for surgery.  I have reviewed the patient's chart and labs.  Questions were answered to the patient's satisfaction.     Donaciano Eva

## 2017-07-17 NOTE — Anesthesia Procedure Notes (Signed)
Procedure Name: Intubation Date/Time: 07/17/2017 12:11 PM Performed by: Danley Danker L Patient Re-evaluated:Patient Re-evaluated prior to induction Oxygen Delivery Method: Circle system utilized Preoxygenation: Pre-oxygenation with 100% oxygen Induction Type: IV induction Ventilation: Mask ventilation without difficulty Laryngoscope Size: Miller and 2 Grade View: Grade I Tube type: Oral Tube size: 7.5 mm Number of attempts: 1 Airway Equipment and Method: Stylet Placement Confirmation: ETT inserted through vocal cords under direct vision,  positive ETCO2 and breath sounds checked- equal and bilateral Secured at: 22 cm Tube secured with: Tape Dental Injury: Teeth and Oropharynx as per pre-operative assessment

## 2017-07-17 NOTE — Anesthesia Preprocedure Evaluation (Addendum)
Anesthesia Evaluation  Patient identified by MRN, date of birth, ID band Patient awake    Reviewed: Allergy & Precautions, NPO status , Patient's Chart, lab work & pertinent test results  Airway Mallampati: I       Dental  (+) Teeth Intact   Pulmonary sleep apnea and Continuous Positive Airway Pressure Ventilation ,    Pulmonary exam normal breath sounds clear to auscultation       Cardiovascular hypertension, Pt. on medications Normal cardiovascular exam Rhythm:Regular Rate:Normal     Neuro/Psych PSYCHIATRIC DISORDERS Anxiety Depression    GI/Hepatic GERD  Medicated and Controlled,  Endo/Other  Hypothyroidism   Renal/GU      Musculoskeletal   Abdominal Normal abdominal exam  (+)   Peds  Hematology   Anesthesia Other Findings   Reproductive/Obstetrics                            Anesthesia Physical Anesthesia Plan  ASA: II  Anesthesia Plan: General   Post-op Pain Management:    Induction: Intravenous  PONV Risk Score and Plan: 4 or greater and Ondansetron, Dexamethasone, Midazolam, Scopolamine patch - Pre-op and Propofol infusion  Airway Management Planned: Oral ETT  Additional Equipment:   Intra-op Plan:   Post-operative Plan: Extubation in OR  Informed Consent: I have reviewed the patients History and Physical, chart, labs and discussed the procedure including the risks, benefits and alternatives for the proposed anesthesia with the patient or authorized representative who has indicated his/her understanding and acceptance.   Dental advisory given  Plan Discussed with: CRNA and Surgeon  Anesthesia Plan Comments:         Anesthesia Quick Evaluation

## 2017-07-17 NOTE — Progress Notes (Addendum)
RT entered Pt room and spoke with Pt regarding CPAP/BiPAP usage.  Pt states that she formerly used CPAP but has not used her CPAP in several years.  Pt stated she did not want to wear CPAP while in hospital.  Education provided to Pt and Pt encouraged to have RN contact RT or MD if she changed her mind. No order for CPAP will be placed at this time.

## 2017-07-17 NOTE — Anesthesia Postprocedure Evaluation (Signed)
Anesthesia Post Note  Patient: Anna [name]  Procedure(s) Performed: Procedure(s) (LRB): LAPAROSCOPY DIAGNOSTIC (N/A) EXPLORATORY LAPAROTOMY (N/A) HYSTERECTOMY ABDOMINAL WITH BILATERAL SALPINGO-OOPHORECTOMY (Bilateral) OMENTECTOMY WITH RADICAL TUMOR DEBULKING (N/A)     Patient location during evaluation: PACU Anesthesia Type: General Level of consciousness: awake and sedated Pain management: pain level controlled Vital Signs Assessment: post-procedure vital signs reviewed and stable Respiratory status: spontaneous breathing Cardiovascular status: stable Postop Assessment: no signs of nausea or vomiting Anesthetic complications: no    Last Vitals:  Vitals:   07/17/17 1600 07/17/17 1615  BP: (!) 169/94 (!) 159/93  Pulse: 94 94  Resp: 10 11  Temp: 36.5 C   SpO2: 98% 100%    Last Pain:  Vitals:   07/17/17 1515  TempSrc:   PainSc: Asleep   Pain Goal: Patients Stated Pain Goal: 3 (07/17/17 1020)               Bella Brummet JR,JOHN Mateo Flow

## 2017-07-17 NOTE — H&P (View-Only) (Signed)
Consult Note: Gyn-Onc  Anna [name] 61 y.o. female  CC:  Chief Complaint  Patient presents with  . Metastatic carcinoma (HCC)    HPI: Patient is seen today in consultation at the request of Dr. Brad Sherrill. Primary physician Dr. Charlene Scott.  Patient is a very pleasant 61-year-old gravida 2 para 2 with menopause in her early 20s. She did take birth control pills on and off the proximally 10 years. She states that starting a few months ago she began experiencing some left lower quadrant pain. She has a known history of diverticulosis that has never had diverticulitis. Because of this persistent discomforts she saw Dr. Scott who ordered a CT scan. The findings are as above.  IMPRESSION: 1. Soft tissue implants within the lower midline anterior peritoneal rim and along the peritoneal surfaces of both paracolic gutters and the left pelvic wall are most consistent with metastatic disease from an unknown primary. Lymphoma is also a consideration, but less likely. 2. No primary malignancy identified. PET CT and histologic sampling of the peritoneal disease are recommended. 3. Hepatic steatosis  She went to see Dr. Sherrill as he took care of her husband for his colon cancer. Tumor markers were drawn and ultimately she underwent a biopsy.   CA-125 82.4, CEA <1.0 Diagnosis Omentum, biopsy - METASTATIC CARCINOMA, CONSISTENT WITH A GYNECOLOGIC PRIMARY. - SEE COMMENT. Microscopic Comment The malignant cells are positive for cytokeratin 7, p53, and PAX-8. They are negative for CDX-2 and cytokeratin 20. The findings are consistent with a primary gynecologic carcinoma. I spoke to Dr. 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 While there are some focal high-grade features is not quite clear.  Her last colonoscopy was 2 years ago. She is due now for her mammogram. Her last Pap smear was 2 years ago and it was normal.  Review of Systems. Constitutional: Denies fever. She has  lost about 10 pounds in the last 6 months to a year. It was unintentional but not unwelcome. She can go up a flight of stairs in her METs or greater than 4 Skin: No rash Cardiovascular: No chest pain, shortness of breath, or edema  Pulmonary: No cough  Gastro Intestinal: Reporting intermittent lower abdominal soreness.  No nausea, vomiting. She does endorse early satiety for about the past month. She has had some intermittent constipation and loose stools in the normal stools but has a bowel movement every day. Genitourinary: Denies vaginal bleeding and discharge.  Musculoskeletal: No joint swelling or pain.  Neurologic: No weakness Psychology: Understandably worried as she took care of her sister-in-law (her brother's wife) who died of primary peritoneal carcinoma proximally 8 months after diagnosis.    Current Meds:  Outpatient Encounter Prescriptions as of 07/09/2017  Medication Sig  . benazepril-hydrochlorthiazide (LOTENSIN HCT) 20-12.5 MG tablet TAKE 1 TABLET DAILY  . buPROPion (WELLBUTRIN XL) 300 MG 24 hr tablet TAKE 1 TABLET DAILY  . Calcium Carb-Cholecalciferol (CALCIUM 1000 + D PO) Take 1 tablet by mouth daily.  . levothyroxine (SYNTHROID, LEVOTHROID) 88 MCG tablet TAKE 1 TABLET DAILY BEFORE BREAKFAST  . Multiple Vitamin (MULTIVITAMIN WITH MINERALS) TABS tablet Take 1 tablet by mouth daily.  . omeprazole (PRILOSEC) 20 MG capsule TAKE 1 CAPSULE TWICE A DAY BEFORE MEALS  . rosuvastatin (CRESTOR) 5 MG tablet TAKE 1 TABLET DAILY  . acyclovir (ZOVIRAX) 400 MG tablet Take one tablet tid for five days prn flares. (Patient not taking: Reported on 07/09/2017)  . acyclovir cream (ZOVIRAX) 5 % Use   as directed. (Patient not taking: Reported on 07/09/2017)  . EPINEPHrine 0.3 mg/0.3 mL IJ SOAJ injection Inject 0.3 mLs (0.3 mg total) into the muscle as needed (for anaphylaxis). (Patient not taking: Reported on 07/09/2017)   No facility-administered encounter medications on file as of 07/09/2017.      Allergy:  Allergies  Allergen Reactions  . Bee Venom Anaphylaxis  . Other Anaphylaxis    Fire ants     Social Hx:   Social History   Social History  . Marital status: Married    Spouse name: N/A  . Number of children: 2  . Years of education: N/A   Occupational History  . Not on file.   Social History Main Topics  . Smoking status: Never Smoker  . Smokeless tobacco: Never Used  . Alcohol use No  . Drug use: No  . Sexual activity: Not on file   Other Topics Concern  . Not on file   Social History Narrative  . No narrative on file    Past Surgical Hx:  Past Surgical History:  Procedure Laterality Date  . APPENDECTOMY  1981  . CARPAL TUNNEL RELEASE  2000  . CESAREAN SECTION  1981 & 1985  . EYE SURGERY Bilateral 2008   Lasix eye srg  . TUBAL LIGATION  1985    Past Medical Hx:  Past Medical History:  Diagnosis Date  . Arthritis   . Depression   . Diverticulitis    H/O  . Frequent headaches    H/O  . GERD (gastroesophageal reflux disease)   . History of chicken pox   . Hypercholesterolemia   . Hypertension   . Hypothyroidism     Oncology Hx:   No history exists.    Family Hx:  Family History  Problem Relation Age of Onset  . Hyperlipidemia Mother   . Hypertension Mother   . Alzheimer's disease Mother   . Arthritis Father   . Hyperlipidemia Father   . Heart disease Father        first MI age 42  . Hypertension Father   . Hyperlipidemia Brother   . Heart disease Brother        heart disease dx at a youg age  . Hypertension Brother   . Alzheimer's disease Maternal Aunt   . Hyperlipidemia Maternal Uncle   . Heart disease Maternal Uncle   . Sudden death Maternal Uncle 42       massive heart attack in doctors office  . Arthritis Maternal Grandmother   . Hyperlipidemia Maternal Grandmother   . Stroke Maternal Grandmother   . Hypertension Maternal Grandmother   . Alzheimer's disease Maternal Grandmother   . Alcohol abuse Maternal  Grandfather   . Hypertension Maternal Grandfather   . Diabetes Maternal Grandfather   . Cancer Paternal Grandmother        Ovarian  . Hypertension Paternal Grandmother   . Alcohol abuse Paternal Grandfather   . Hyperlipidemia Paternal Grandfather   . Heart disease Paternal Grandfather   . Hypertension Paternal Grandfather   . Breast cancer Cousin        pat cousin    Vitals:  Blood pressure 126/77, pulse 87, temperature 98.4 F (36.9 C), temperature source Oral, resp. rate 18, height 5' 3" (1.6 m), weight 174 lb 4.8 oz (79.1 kg), SpO2 98 %.  Physical Exam:Well-nourished well-developed female in no acute distress.  Neck: Supple, no lymphadenopathy, no thyromegaly.  Lungs: Clear to auscultation bilaterally.  Cardiac: Regular rate and rhythm    Abdomen: Well-healed Pfannenstiel skin incision. Abdomen is soft, nontender, nondistended. There is no fluid wave. There is no hepatomegaly. There is no distinct abdominal pelvic mass appreciated.  Groins: No lymphadenopathy.  Extremity: No edema.  Pelvic: External genitalia within normal limits. Vagina slightly atrophic. The cervix is nulliparous. There are are no lesions. There is no discharge. There is no bleeding. Bimanual examination the uterus itself is of normal size shape and consistency there and there are no adnexal masses. However, when you push along the anterior wall the cervix does move concomitant with that. On rectal examination there is no nodularity.  Assessment/Plan:  61-year-old with what appears to be an omental cake on CT scan. I cannot appreciate the peritoneal nodularity in the paracolic gutters that was seen by radiology. I showed the CT images to the patient and her husband today. They were shown the area just below the umbilicus that appears to be related to omental based disease.  We discussed that I would recommend proceeding with a diagnostic laparoscopy with an incision in the left upper quadrant. If at the time  of laparoscopy it appears that she has resectable disease, we would convert to an exploratory laparotomy TAH/BSO omentectomy and surgical debulking which could include removal of peritoneum and bowel. If at the time of diagnostic laparoscopy she has extensive disease that cannot be optimally resected, we will remove a larger portion of tumor for more pathologic evaluation and then proceed with neoadjuvant chemotherapy and interval cytoreductive surgery.  They understand the rationale a strategy for this. Risks of surgery including but not limited to bleeding, infection, injury to surrounding organs and possible bowel surgery were discussed with patient. We also discussed the risk of VTE and that she would be going home with Lovenox injections for 28 days if we proceed with a laparotomy and that she would also be wearing SCDs in the hospital.  Her questions as well as those of her husband were elicited in answer to their satisfaction.  Her surgery is tentatively scheduled for August 30 with Dr. Emma Rossi. I have spoken with Dr. Rossi about this patient's particular case.  We appreciate the opportunity to partner in the care of this very pleasant patient.  Safal Halderman A., MD 07/09/2017, 11:51 AM 

## 2017-07-18 ENCOUNTER — Encounter (HOSPITAL_COMMUNITY): Payer: Self-pay | Admitting: Gynecologic Oncology

## 2017-07-18 LAB — CBC
HEMATOCRIT: 33.5 % — AB (ref 36.0–46.0)
Hemoglobin: 11 g/dL — ABNORMAL LOW (ref 12.0–15.0)
MCH: 30.1 pg (ref 26.0–34.0)
MCHC: 32.8 g/dL (ref 30.0–36.0)
MCV: 91.5 fL (ref 78.0–100.0)
Platelets: 295 10*3/uL (ref 150–400)
RBC: 3.66 MIL/uL — ABNORMAL LOW (ref 3.87–5.11)
RDW: 13.7 % (ref 11.5–15.5)
WBC: 12.5 10*3/uL — ABNORMAL HIGH (ref 4.0–10.5)

## 2017-07-18 LAB — BASIC METABOLIC PANEL
Anion gap: 6 (ref 5–15)
BUN: 14 mg/dL (ref 6–20)
CALCIUM: 8.3 mg/dL — AB (ref 8.9–10.3)
CO2: 28 mmol/L (ref 22–32)
CREATININE: 0.78 mg/dL (ref 0.44–1.00)
Chloride: 104 mmol/L (ref 101–111)
GFR calc Af Amer: >60 mL/min (ref >60)
GFR calc non Af Amer: >60 mL/min (ref >60)
GLUCOSE: 140 mg/dL — AB (ref 65–99)
Potassium: 4.4 mmol/L (ref 3.5–5.1)
Sodium: 138 mmol/L (ref 135–145)

## 2017-07-18 NOTE — Discharge Planning (Signed)
Patient educated on lovenox injections and displayed comprehension and ability through teachback, by giving herself the injection. Verbally also indicated that she is comfortable to give further injections at home.

## 2017-07-18 NOTE — Progress Notes (Signed)
1 Day Post-Op Procedure(s) (LRB): LAPAROSCOPY DIAGNOSTIC (N/A) EXPLORATORY LAPAROTOMY (N/A) HYSTERECTOMY ABDOMINAL WITH BILATERAL SALPINGO-OOPHORECTOMY (Bilateral) OMENTECTOMY WITH RADICAL TUMOR DEBULKING (N/A)  Subjective: Patient reports doing well this am.  Tolerating liquids and waiting for regular food to be delivered this am.  Pain controlled with PRN medications.  Up with assist.  Voiding since foley removal.  Denies chest pain, dyspnea, passing flatus, or having a bowel movement.  No concerns voiced.    Objective: Vital signs in last 24 hours: Temp:  [97.5 F (36.4 C)-98.5 F (36.9 C)] 98.5 F (36.9 C) (08/31 1400) Pulse Rate:  [74-98] 88 (08/31 1400) Resp:  [10-20] 18 (08/31 1400) BP: (103-169)/(57-106) 103/63 (08/31 1400) SpO2:  [93 %-100 %] 93 % (08/31 1400) Last BM Date: 07/17/17  Intake/Output from previous day: 08/30 0701 - 08/31 0700 In: 4217 [P.O.:1017; I.V.:3100; IV Piggyback:100] Out: 1750 [Urine:1400; Blood:350]  Physical Examination: General: alert, cooperative and no distress Resp: clear to auscultation bilaterally Cardio: regular rate and rhythm, S1, S2 normal, no murmur, click, rub or gallop GI: soft, non-tender; bowel sounds normal; no masses,  no organomegaly and incision: midline incision with honeycomb dressing in place, no drainage or bleeding noted Extremities: extremities normal, atraumatic, no cyanosis or edema  Labs: WBC/Hgb/Hct/Plts:  12.5/11.0/33.5/295 (08/31 0505) BUN/Cr/glu/ALT/AST/amyl/lip:  14/0.78/--/--/--/--/-- (08/31 0505)  Assessment: 61 y.o. s/p Procedure(s): LAPAROSCOPY DIAGNOSTIC EXPLORATORY LAPAROTOMY HYSTERECTOMY ABDOMINAL WITH BILATERAL SALPINGO-OOPHORECTOMY OMENTECTOMY WITH RADICAL TUMOR DEBULKING: stable Pain:  Pain is well-controlled on PRN medications.  Heme: Hgb 11.0 and Hct 33.5 this am. Stable post-operatively.  CV: BP and HR stable post-op.  Continue to monitor.  GI:  Tolerating po: Yes.  Antiemetics ordered  PRN.  GU: Adequate output reported.  Voiding    FEN: Stable post-operatively.  Prophylaxis: pharmacologic prophylaxis (with any of the following: enoxaparin (Lovenox) 40mg  SQ 2 hours prior to surgery then every day) and intermittent pneumatic compression boots.  Plan: Saline lock if diet tolerated Encourage ambulation, IS use, deep breathing, and coughing Continue post-op plan of care per Dr. Fermin Schwab    LOS: 1 day    Anna [name] 07/18/2017, 3:32 PM

## 2017-07-19 MED ORDER — PROCHLORPERAZINE EDISYLATE 5 MG/ML IJ SOLN
10.0000 mg | Freq: Four times a day (QID) | INTRAMUSCULAR | Status: DC | PRN
  Filled 2017-07-19: qty 2

## 2017-07-19 MED ORDER — DEXTROSE IN LACTATED RINGERS 5 % IV SOLN
INTRAVENOUS | Status: DC
  Administered 2017-07-19: 13:00:00 via INTRAVENOUS

## 2017-07-19 MED ORDER — ENOXAPARIN (LOVENOX) PATIENT EDUCATION KIT
PACK | Freq: Once | Status: AC
  Administered 2017-07-19: 10:00:00
  Filled 2017-07-19: qty 1

## 2017-07-19 NOTE — Progress Notes (Addendum)
2 Days Post-Op Procedure(s) (LRB): LAPAROSCOPY DIAGNOSTIC (N/A) EXPLORATORY LAPAROTOMY (N/A) HYSTERECTOMY ABDOMINAL WITH BILATERAL SALPINGO-OOPHORECTOMY (Bilateral) OMENTECTOMY WITH RADICAL TUMOR DEBULKING (N/A)  Subjective: +nausea.  No vomiting, flatus, BM   Objective: Vital signs in last 24 hours: Temp:  [97.9 F (36.6 C)-98.6 F (37 C)] 97.9 F (36.6 C) (09/01 0620) Pulse Rate:  [80-91] 89 (09/01 0620) Resp:  [16-18] 16 (09/01 0620) BP: (103-122)/(58-76) 117/58 (09/01 0620) SpO2:  [93 %-95 %] 93 % (09/01 0620) Last BM Date: 07/17/17  Intake/Output from previous day: 08/31 0701 - 09/01 0700 In: 2500 [P.O.:1500; I.V.:1000] Out: 1425 [Urine:1425]  Physical Examination: General: alert, cooperative and no distress Resp: clear to auscultation bilaterally Cardio: regular rate and rhythm, S1, S2 normal, no murmur, click, rub or gallop GI: soft, non-tender; bowel sounds normal; no masses,  no organomegaly and incision: midline incision with honeycomb dressing in place, no drainage or bleeding noted Extremities: extremities normal, atraumatic, no cyanosis or edema  Labs:      Assessment: 61 y.o. s/p Procedure(s): LAPAROSCOPY DIAGNOSTIC EXPLORATORY LAPAROTOMY HYSTERECTOMY ABDOMINAL WITH BILATERAL SALPINGO-OOPHORECTOMY OMENTECTOMY WITH RADICAL TUMOR DEBULKING: stable Pain:  Pain is well-controlled on PRN medications.  CV: B/P's in range  GI:  Postoperative ileus.  Tolerating po: No.    Prophylaxis: pharmacologic prophylaxis (with any of the following: enoxaparin (Lovenox) 40mg  SQ 2 hours prior to surgery then every day) and intermittent pneumatic compression boots.  Plan: Liquid diet as tolerated Antiemetics Encourage ambulation, IS use, deep breathing, and coughing     LOS: 2 days    [name]-MOORE,Briasia Flinders A 07/19/2017, 10:12 AM

## 2017-07-19 NOTE — Progress Notes (Signed)
Pt nauseated, vomiting small amount earlier. Antiemetic given. Awaiting call back from Dr. Delsa Sale.

## 2017-07-19 NOTE — Progress Notes (Signed)
Pt c/o recurring nausea with small amount of "clear vomit". Ambulated pt in hall 120 ft. Tolerated fair. Pt weak and nausea continued post walk yet no vomiting. To BR then to rocking chair. Dr. Delsa Sale notified via phone of recurring N/V. See new order received.

## 2017-07-19 NOTE — Progress Notes (Signed)
Dr. Delsa Sale aware via phone pt feeling better with no further vomitus noted. No new orders received. Pt reassured and encouraged to ambulated. Pt verbalized understanding stating "I will later".

## 2017-07-20 MED ORDER — OXYCODONE-ACETAMINOPHEN 5-325 MG PO TABS: 1.0000 | ORAL_TABLET | ORAL | 0 refills | Status: DC | PRN

## 2017-07-20 MED ORDER — ENOXAPARIN SODIUM 40 MG/0.4ML ~~LOC~~ SOLN: 40.0000 mg | SUBCUTANEOUS | 0 refills | Status: DC

## 2017-07-20 NOTE — Progress Notes (Signed)
IV site infiltrated. Protocol followed.  Patient denies pain or discomfort at this time.

## 2017-07-20 NOTE — Progress Notes (Signed)
3 Days Post-Op Procedure(s) (LRB): LAPAROSCOPY DIAGNOSTIC (N/[name]) EXPLORATORY LAPAROTOMY (N/[name]) HYSTERECTOMY ABDOMINAL WITH BILATERAL SALPINGO-OOPHORECTOMY (Bilateral) OMENTECTOMY WITH RADICAL TUMOR DEBULKING (N/[name])  Subjective: + flatus, No N/V/BM   Objective: Vital signs in last 24 hours: Temp:  [98 F (36.7 C)-98.4 F (36.9 C)] 98 F (36.7 C) (09/02 0502) Pulse Rate:  [88-102] 88 (09/02 0502) Resp:  [18] 18 (09/02 0502) BP: (113-151)/(64-87) 113/64 (09/02 0502) SpO2:  [93 %-97 %] 97 % (09/02 0502) Last BM Date: 07/17/17  Intake/Output from previous day: 09/01 0701 - 09/02 0700 In: 2670.3 [P.O.:1200; I.V.:1470.3] Out: 2150 [Urine:2150]  Physical Examination: General: alert, cooperative and no distress Resp: clear to auscultation bilaterally Cardio: regular rate and rhythm, S1, S2 normal, no murmur, click, rub or gallop GI: soft, non-tender; bowel sounds normal; no masses,  no organomegaly and incision: midline incision with honeycomb dressing in place, no drainage or bleeding noted Extremities: extremities normal, atraumatic, no cyanosis or edema  Labs:      Assessment: 61 y.o. s/p Procedure(s): LAPAROSCOPY DIAGNOSTIC EXPLORATORY LAPAROTOMY HYSTERECTOMY ABDOMINAL WITH BILATERAL SALPINGO-OOPHORECTOMY OMENTECTOMY WITH RADICAL TUMOR DEBULKING: stable Pain:  Pain is well-controlled on PRN medications.  CV: B/P's in range  GI:  Resolving ileus.     Prophylaxis: pharmacologic prophylaxis (with any of the following: enoxaparin (Lovenox) 40mg  SQ 2 hours prior to surgery then every day) and intermittent pneumatic compression boots.  Plan: Advance diet as tolerated Encourage ambulation, IS use, deep breathing, and coughing Discharge planning     LOS: 3 days    Anna [name] 07/20/2017, 12:00 PM

## 2017-07-20 NOTE — Progress Notes (Signed)
Assessment unchanged. Pt verbalized understanding of dc instructions of dc instructions through teach back including follow up care, when to call the doctor, diet and activity and medications. Lovenox teaching completed. Pt self administered 9/1 am dose with good aseptic technique. "I feel comfortable giving myself the shot." Scripts x 2 given as provided by MD. Discharged via wc to front entrance accompanied by family and NT.

## 2017-07-20 NOTE — Discharge Summary (Signed)
Physician Discharge Summary  Patient ID: Anna [name] MRN: [id number] DOB/AGE: 06/02/1956 61 y.o.  Admit date: 07/17/2017 Discharge date: 07/20/2017  Admission Diagnoses:   Carcinomatosis ([place])   Ovarian cancer [facility])  Discharge Diagnoses:  Active Problems:   Carcinomatosis ([place])   Ovarian cancer [facility])   Discharged Condition: good  Hospital Course: On 07/17/2017, the patient underwent the following: Procedure(s): LAPAROSCOPY DIAGNOSTIC EXPLORATORY LAPAROTOMY HYSTERECTOMY ABDOMINAL WITH BILATERAL SALPINGO-OOPHORECTOMY OMENTECTOMY WITH RADICAL TUMOR DEBULKING.   The postoperative course was remarkable for a mild postoperative ileus.  This resolved with bowel rest.  She was discharged to home on postoperative day 3 tolerating a regular diet.  Consults: None  Significant Diagnostic Studies: none  Treatments: see above  Discharge Exam: Blood pressure 113/64, pulse 88, temperature 98 F (36.7 C), temperature source Oral, resp. rate 18, height 5\' 3"  (1.6 m), weight 173 lb (78.5 kg), SpO2 97 %. General appearance: alert Resp: clear to auscultation bilaterally Cardio: regular rate and rhythm, S1, S2 normal, no murmur, click, rub or gallop GI: soft, non-tender; bowel sounds normal; no masses,  no organomegaly Extremities: extremities normal, atraumatic, no cyanosis or edema Incision/Wound: C/D/I  Disposition: Final discharge disposition not confirmed  Discharge Instructions    Activity as tolerated - No restrictions    Complete by:  As directed    Call MD for:  extreme fatigue    Complete by:  As directed    Call MD for:  persistant dizziness or light-headedness    Complete by:  As directed    Call MD for:  persistant nausea and vomiting    Complete by:  As directed    Call MD for:  redness, tenderness, or signs of infection (pain, swelling, redness, odor or green/yellow discharge around incision site)    Complete by:  As directed    Call MD for:  severe uncontrolled  pain    Complete by:  As directed    Call MD for:  temperature >100.4    Complete by:  As directed    Diet - low sodium heart healthy    Complete by:  As directed    Discharge instructions    Complete by:  As directed    Planning for Recovery and Going Home Your Guide to Gynecologic Surgery    In-Hospital Recovery Plan Team Caring for You After Surgery In addition to the nursing staff on the unit, the gynecological surgery team will care for you. This team is led by your surgeon and includes medical students and a physician assistant or nurse practitioner. There will be a physician in the hospital 24 hours a day to tend to your needs. The students report directly to your surgeon, who is the one overseeing all of your care.  Pain Relief After Surgery Your pain will be assessed regularly on a scale from 0 to 10. Pain assessment is  necessary to guide your pain relief. It is essential that you are able to take deep breaths, cough and move. Prevention or early treatment of pain is far more effective than trying to treat severe pain. Therefore, we have devised a specialized regimen to stay ahead of your pain and use almost no narcotics, which can slow down your recovery process. If you have an epidural catheter, you will receive a  constant infusion of pain medication through your epidural. If you need additional pain relief, you will be able to push a button to increase the medication in your epidural. You will also be given acetaminophen  and an ibuprofen-like medication to keep your pain under control.  You can always ask for additional pain pills if you are not comfortable. In most cases an anesthesiologist with expertise in pain management will visit you every day and help design your pain management plan.  One Day After Surgery Focus on drinking and walking. You will start drinking clear liquids after surgery. The intravenous fluids will be stopped, and the catheter may be  removed  from your bladder. We expect you to get out of bed, with the nurses' or assistants' help, sit in a chair for six hours and start to move about in the hallways. You will also meet with a case manager to assess your discharge needs, including home nursing. Your physician may order home care to assist with your transition home.  Home nursing visits, which are intermittent, help you get readjusted to home by teaching treatments, monitoring medications, and performing clinical assessment and reporting back to your physician. Other services may include therapy and medical equipment; private duty services are also available. If you are going "home" to a different address upon discharge, please alert [place]. A Home Care Coordinator can visit with you while in the hospital to discuss your options. If you have questions please speak with your case manager. If you need rehabilitation at a facility, a social worker will assist with this. If you need rehabilitation at a facility, a social worker will assist with this. If your procedure was performed in a minimally invasive fashion, you will be discharged to home if your pain is well controlled and you are tolerating a regular diet.     Two Days After Surgery You will start eating a soft diet and change to a more solid diet as you feel up to it. The catheter from your bladder will be removed, if not already done so. If there is a dressing on your wound, it will be removed. The tubing will be disconnected from your IV. We expect you to be out of bed for the majority of the day and walking at least three times in the hallway, with assistance as needed.  You may be discharged at this point if it is felt you are ready.   Three Days After Surgery You continue to eat your low residue diet. You may be ready to go home if you are drinking enough to keep yourself hydrated, your pain is well controlled, you are not belching or nauseated, you are passing gas and  you are able to get around on your own. However, we will not discharge you from the hospital until we are sure you are ready.  Discharge Discharge time is at 10 a.m. You will need to make arrangements for someone to accompany you home. You will not be released without someone present. Please keep in mind that we strive to get patients discharged as quickly as possible, but there may be delays for a variety of reasons. Complications That May Delay Discharge: ? Nausea and vomiting: It is very common to feel sick after your surgery. We give you medication to reduce this. However, if you do feel sick, you should reduce the amount you are taking by mouth. Small, frequent meals or drinks are best in this  situation. As long as you can drink and keep yourself hydrated, the nausea will likely pass.  Ileus: Following surgery, the bowel can be sluggish, making it difficult for food and gas to pass through the intestines. This is called an ileus. We  have designed our care program to do everything possible to reduce the likelihood of an ileus. If you do develop an ileus, it usually only lasts two to three days. However, it may require a small tube down the nose to decompress the stomach. The best way to avoid an ileus is to reduce the amount of narcotic pain medications, get up as much as possible after your surgery, and stimulate the bowel early after surgery with small amounts of food and liquids.  Wound infection: If a wound infection develops, this usually happens three to ten days after surgery.    Urinary retention: This is if you are unable to urinate after the catheter from your bladder is removed. The catheter may need to be reinserted until you are able to urinate on your own. This can be caused by anesthesia, pain medication and decreased activity.    When you are preparing to go home, you will receive:  Detailed discharge instructions, with information about your operation and  medications    All prescriptions for medications you need at home; prescriptions can be filled while you are in the hospital if you would like    You may be prescribed Lovenox. Lovenox is used to reduce the risk of developing a blood clot after surgery. An appointment to see your surgeon or provider one to two weeks after you leave the hospital for follow-up   After Discharge Once you are discharged: Call us at any time if you are worried about your recovery or if you should have any questions. During regular office hours, (8:30 a.m.-4:30 p.m.), and after hours call [phone].  Call us immediately if:  You have a fever higher than 100.4 degrees.   Your wound is red, more painful or has drainage.    You are nauseated, vomiting or can't keep liquids down.    Your pain is worse and not able to be controlled with the regimen you were sent home with.    If you are bleeding heavily or have a lot of fluid coming from your vagina. If you are on narcotics, the goal is to wean you off of them. If you are running low on supply and need more, call the nurse a few days before you will run out.  It is generally easier to reach someone between 8:30 a.m. - 4:30 p.m., so call early if you think something is not right. A nurse or nurse practitioner is available every day to answer your questions. After hours and on the weekends, the calls go to the resident doctors in the hospital. It may take longer for your phone call to be returned during this time. If you have a true emergency, such as severe abdominal pain, chest pain, shortness of breath or any other acute issues, call 911 and go to the local emergency room. Have them contact our team once you are stable.  Concerns After Discharge Bowel Function Following Your Surgery Your bowels will take several weeks to settle down and may be unpredictable at first. Your bowel movements may become loose, or you may be constipated. For the vast  number of patients, this will get back to normal with time. Make sure you eat nutritious meals, drink plenty of fluids and take regular walks during the first two weeks after your operation. Your Guide to Gynecologic Surgery   Abdominal Pain It is not unusual to suffer gripping pains (colic) during the first week following removal of a portion of your bowel. This pain  usually lasts for a few minutes but goes away between spasms. If you have severe pain lasting more than one to two hours or have a fever and feel generally unwell, you should contact us at  the telephone contact numbers listed at the end of this packet. Hysterectomy: You should have pelvic rest for six (6) weeks or as specified by your doctor after surgery. You should have nothing in the vagina (no tampons, douching, intercourse, etc.,) during this time period. If you have some vaginal spotting, this is normal. If you have heavy bleeding or  a lot of fluid from your vagina, this is NOT normal and you should contact your doctor's office or, if after hours, contact the doctor on call.  Diarrhea: Fiber and Imodium (Loperamide) The first step to improving your frequent or loose stools is to bulk up the stool with fiber. Metamucil is the most common type of fiber that is available at any drug store. Start with 1 teaspoon mixed into food, like yogurt or oatmeal, in the morning and evening. Try not to drink any fluid for one hour after you take the fiber. This will allow the fiber to act like a sponge in your intestines, soaking up all the excess water. Continue this for three to five days. You may increase by 1 teaspoon every three to five days until the desired affect, or you are at 1 tablespoon (3 teaspoons) twice a day. If this doesn't work, you may try over-the-counter Loperamide, which is an antidiarrheal medication. You may take one tablet in the morning and evening or 30 minutes before you typically have diarrhea. You  may take up to eight of these tablets daily. It is best to discuss this with [place] prior to using this medication. If you have continuous diarrhea and abdominal cramping, call [phone].  Foley Catheter Your surgeon may recommend you be discharged home with a foley catheter (bladder catheter) for 1 to 2 weeks. Typically this recommendation will be made for patients undergoing surgery to the lower urinary tract. Before you leave the hospital, your nurse should outfit you with a clip on the inner thigh to secure the catheter to prevent pulling as well as a small bag that can be easily worn on the upper leg under loose fitting pants and skirts. Your nurse will teach you how to exchange the large bag that typically comes with the catheter for the small bag. You may find it convenient to attach the small bag when active during the day and then the large bag when sleeping at night. If there is ever a point when you notice the catheter is not draining urine and youbegin to develop pain behind/above the pubic bone, you should report to the clinic or emergency room immediately as the catheter may be kinked or clogged. Kinking or clogging of the catheter prevents urine from draining from your bladder. Urine will quickly build up in the bladder and can cause severe pain as well as seriously disrupt healing if you have undergone surgery on the lower urinary tract. Additionally, pulling on the catheter can result in displacement of the balloon at the end of the catheter from inside of to outside  of the bladder. This also results in severe pain and can cause bleeding. For this reason, secure the catheter to the clip on your inner thigh at all times as the clip prevents against pulling.  Wound Care For the first few weeks following surgery, your wound may be slightly red and  uncomfortable. You may shower and let the soapy water wash over your incision. Avoid soaking in the tub for one month following surgery  or until the wound is well healed. It will take the wound several months to "soften." It is common to have bumpy areas in the wound near the belly  button and at the ends of the incision.  If you have staples, these should be removed when you are seen by your surgeon at the follow-up appointment. You may have a glue-like material on your incision. Do not pick at this. It will come off over time. It is the surgical glue used in surgery to close your incision. You also have sutures inside of you that will dissolve over time  Post-Surgery Diet Attention to good nutrition after surgery is important to your recovery. If you had no dietary restrictions prior to the surgery, you will have no special dietary restrictions after the surgery. However, consuming enough protein, calories, vitamins and minerals is necessary to support healing. Some patients find their appetite is less than normal after surgery. In this case, frequent small meals throughout the day may help. It is not uncommon to lose 10 to 15 pounds after surgery. However, by the fourth to fifth week, your weight loss should stabilize. It is normal that certain foods taste different and certain smells may make you nauseas. Over time, the amount you can comfortably consume will gradually increase. You should try to eat a balanced diet, which includes:  Foods that are soft, moist, and easy to chew and swallow    Canned or soft-cooked fruits and vegetables   Plenty of soft breads, rice, pasta, potatoes and other starchy foods (lowerfiber  varieties may be tolerated better initially)    High-protein foods and beverages, such as meats, eggs, milk, cottage cheese  or a supplemental nutrition drink like Boost or Ensure    Drink plenty of fluids-at least 8 to 10 cups per day. This includes water,  fruit juice, Gatorade, teas/coffee and milk. Drinking plenty is especially important if you have loose stools (diarrhea).   Avoid drinking  a lot of caffeine, since this may dehydrate you.    Avoid fried, greasy and highly seasoned or spicy foods.    Avoid carbonated beverages in the first couple weeks.    Avoid raw fruits and vegetables.   Hobbies/Activities Walking is encouraged after your surgery. You should plan to undertake regular exercise several times a day and gradually increase this during the four weeks following your operation until you are back to your normal level of activity. You may climb stairs. Don't do any heavy lifting greater than 10 pounds or contact sports for the first month after your surgery. Generally, you can return to hobbies and activities soon after your surgery. This will help you recover. It can take up to two to three months to fully recover. It is not unusual to be fatigued and require an afternoon nap for up to six to eight weeks following surgery. Your body is using this energy to heal your wounds. Set small goals for yourself and try to do a little more each day.  Work It is normal to return to work three to six weeks following your operation. If your job involves heavy manual work, then you should wait six weeks. However, you should check with your employer regarding rules, which may be relevant to your return to work. If you need a return-to-work form for your employer or disability papers, bring them  to your follow-up appointment or fax them to our office at [phone].  Driving You may drive when you are off narcotics and pain-free enough to react quickly with your braking foot. For most patients, this occurs at one to four weeks following surgery.   Write down any questions you may have to ask your care team.  Important Contact Numbers: GYN Oncology Office: [phone]   Discharge wound care:    Complete by:  As directed    Keep clean and dry   Driving Restrictions    Complete by:  As directed    No driving for 1- 2 weeks   Increase activity slowly    Complete by:   As directed    Lifting restrictions    Complete by:  As directed    No lifting > 5 lbs for 6 weeks   May shower / Bathe    Complete by:  As directed    No tub baths for 6 weeks   Sexual Activity Restrictions    Complete by:  As directed    No intercourse for 6 - 8 weeks     Allergies as of 07/20/2017      Reactions   Bee Venom Anaphylaxis   Other Anaphylaxis   Fire ants       Medication List    TAKE these medications   acyclovir 400 MG tablet Commonly known as:  ZOVIRAX Take one tablet tid for five days prn flares.   acyclovir cream 5 % Commonly known as:  ZOVIRAX Use as directed.   benazepril-hydrochlorthiazide 20-12.5 MG tablet Commonly known as:  LOTENSIN HCT TAKE 1 TABLET DAILY   buPROPion 300 MG 24 hr tablet Commonly known as:  WELLBUTRIN XL TAKE 1 TABLET DAILY   CALCIUM 1000 + D PO Take 1 tablet by mouth daily.   enoxaparin 40 MG/0.4ML injection Commonly known as:  LOVENOX Inject 0.4 mLs (40 mg total) into the skin daily.   EPINEPHrine 0.3 mg/0.3 mL Soaj injection Commonly known as:  EPI-PEN Inject 0.3 mLs (0.3 mg total) into the muscle as needed (for anaphylaxis).   levothyroxine 88 MCG tablet Commonly known as:  SYNTHROID, LEVOTHROID TAKE 1 TABLET DAILY BEFORE BREAKFAST   multivitamin with minerals Tabs tablet Take 1 tablet by mouth daily.   omeprazole 20 MG capsule Commonly known as:  PRILOSEC TAKE 1 CAPSULE TWICE A DAY BEFORE MEALS   oxyCODONE-acetaminophen 5-325 MG tablet Commonly known as:  PERCOCET/ROXICET Take 1-2 tablets by mouth every 4 (four) hours as needed for severe pain.   rosuvastatin 5 MG tablet Commonly known as:  CRESTOR TAKE 1 TABLET DAILY            Discharge Care Instructions        Start     Ordered   07/21/17 0000  enoxaparin (LOVENOX) 40 MG/0.4ML injection  Every 24 hours     07/20/17 1204   07/20/17 0000  oxyCODONE-acetaminophen (PERCOCET/ROXICET) 5-325 MG tablet  Every 4 hours PRN     07/20/17 1204    07/20/17 0000  Activity as tolerated - No restrictions     07/20/17 1647   07/20/17 0000  Increase activity slowly     07/20/17 1647   07/20/17 0000  May shower / Bathe    Comments:  No tub baths for 6 weeks   07/20/17 1647   07/20/17 0000  Driving Restrictions    Comments:  No driving for 1- 2 weeks   07/20/17 1647  07/20/17 0000  Lifting restrictions    Comments:  No lifting > 5 lbs for 6 weeks   07/20/17 1647   07/20/17 0000  Sexual Activity Restrictions    Comments:  No intercourse for 6 - 8 weeks   07/20/17 1647   07/20/17 0000  Diet - low sodium heart healthy     07/20/17 1647   07/20/17 0000  Discharge wound care:    Comments:  Keep clean and dry   07/20/17 1647   07/20/17 0000  Call MD for:  temperature >100.4     07/20/17 1647   07/20/17 0000  Call MD for:  persistant nausea and vomiting     07/20/17 1647   07/20/17 0000  Call MD for:  severe uncontrolled pain     07/20/17 1647   07/20/17 0000  Call MD for:  redness, tenderness, or signs of infection (pain, swelling, redness, odor or green/yellow discharge around incision site)     07/20/17 1647   07/20/17 0000  Call MD for:  persistant dizziness or light-headedness     07/20/17 1647   07/20/17 0000  Call MD for:  extreme fatigue     07/20/17 1647      07/20/17 1647       Signed: Lahoma Crocker A 07/20/2017, 4:48 PM

## 2017-07-23 ENCOUNTER — Telehealth: Payer: Self-pay | Admitting: Gynecologic Oncology

## 2017-07-23 NOTE — Telephone Encounter (Signed)
Left message for patient.  Calling to check in on patient.  Patient returned call.  Reporting doing well since surgery.  All questions answered.  Advised to call for any needs or concerns.

## 2017-07-25 ENCOUNTER — Telehealth: Payer: Self-pay | Admitting: *Deleted

## 2017-07-25 ENCOUNTER — Other Ambulatory Visit: Payer: Self-pay

## 2017-07-25 MED ORDER — BENAZEPRIL-HYDROCHLOROTHIAZIDE 20-12.5 MG PO TABS: 1.0000 | ORAL_TABLET | Freq: Every day | ORAL | 1 refills | Status: DC

## 2017-07-25 NOTE — Telephone Encounter (Signed)
Refill sent.

## 2017-07-25 NOTE — Telephone Encounter (Signed)
Pt has requested a medication refill for benazepril  Pt requested a call when this is sent to the pharmacy  [facility]  Pt contact [phone]

## 2017-07-28 ENCOUNTER — Ambulatory Visit (HOSPITAL_BASED_OUTPATIENT_CLINIC_OR_DEPARTMENT_OTHER): Payer: 59 | Admitting: Oncology

## 2017-07-28 ENCOUNTER — Telehealth: Payer: Self-pay | Admitting: Oncology

## 2017-07-28 VITALS — BP 129/79 | HR 93 | Temp 98.3°F | Resp 17 | Ht 63.0 in | Wt 169.3 lb

## 2017-07-28 DIAGNOSIS — C5700 Malignant neoplasm of unspecified fallopian tube: Secondary | ICD-10-CM | POA: Insufficient documentation

## 2017-07-28 DIAGNOSIS — C562 Malignant neoplasm of left ovary: Secondary | ICD-10-CM

## 2017-07-28 DIAGNOSIS — F329 Major depressive disorder, single episode, unspecified: Secondary | ICD-10-CM

## 2017-07-28 DIAGNOSIS — C7961 Secondary malignant neoplasm of right ovary: Secondary | ICD-10-CM

## 2017-07-28 DIAGNOSIS — C786 Secondary malignant neoplasm of retroperitoneum and peritoneum: Secondary | ICD-10-CM | POA: Diagnosis not present

## 2017-07-28 DIAGNOSIS — C569 Malignant neoplasm of unspecified ovary: Secondary | ICD-10-CM

## 2017-07-28 DIAGNOSIS — C5702 Malignant neoplasm of left fallopian tube: Secondary | ICD-10-CM

## 2017-07-28 NOTE — Progress Notes (Signed)
[facility] OFFICE PROGRESS NOTE   Diagnosis: Fallopian tube carcinoma  INTERVAL HISTORY:   Ms. Nissley returns for scheduled visit. The omentum biopsy 07/04/2017 revealed metastatic carcinoma consistent with a gynecologic primary. She was taken to the operating room by Dr. Denman George 07/17/2017 for an exploratory laparotomy with total abdominal hysterectomy, bilateral salpingo-oophorectomy, and omentectomy. A 10 cm omental plaque was adherent to the anterior lower abdominal peritoneum. A plaque of tumor was noted to cope the left ovarian fossa with adherence sigmoid colon. No disease on the intestines. Minimal nodularity on the left ovary and fallopian tube. Small peritoneal studding on the diaphragm. No other visible tumor. An optimal cytoreduction was performed with a thin plaque of tumor remaining at the posterior left peritoneum.  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 Metastatic high-grade carcinoma involving the right fallopian tube, bilateral ovaries, and the omentum. The tumor was staged as a pT3,pNx lesion.  She has recovered from surgery. She is eating and her bowels are functioning. Objective:  Vital signs in last 24 hours:  Blood pressure 129/79, pulse 93, temperature 98.3 F (36.8 C), temperature source Oral, resp. rate 17, height 5\' 3"  (1.6 m), weight 169 lb 4.8 oz (76.8 kg), SpO2 99 %.    Resp: Lungs clear bilaterally Cardio: Regular rate and rhythm GI: No hepatosplenomegaly, healed midline incision with a dressing in place Vascular: No leg edema    Lab Results:  Lab Results  Component Value Date   WBC 12.5 (H) 07/18/2017   HGB 11.0 (L) 07/18/2017   HCT 33.5 (L) 07/18/2017   MCV 91.5 07/18/2017   PLT 295 07/18/2017   NEUTROABS 3,807 06/23/2017    CMP     Component Value Date/Time   NA 138 07/18/2017 0505   K 4.4 07/18/2017 0505   CL 104 07/18/2017 0505   CO2 28 07/18/2017 0505   GLUCOSE 140  (H) 07/18/2017 0505   BUN 14 07/18/2017 0505   CREATININE 0.78 07/18/2017 0505   CREATININE 0.72 06/23/2017 1454   CALCIUM 8.3 (L) 07/18/2017 0505   PROT 7.7 07/11/2017 0831   ALBUMIN 3.8 07/11/2017 0831   AST 21 07/11/2017 0831   ALT 20 07/11/2017 0831   ALKPHOS 69 07/11/2017 0831   BILITOT 0.5 07/11/2017 0831   GFRNONAA >60 07/18/2017 0505   GFRAA >60 07/18/2017 0505    Medications: I have reviewed the patient's current medications.  Assessment/Plan: 1. Left abdomen/pelvic pain ? CT abdomen/pelvis 06/26/2017-soft tissue implants in the lower anterior peritoneum with implants at the paracolic gutters and left pelvic sidewall ? Elevated CA 125 ? CT biopsy of anterior omental mass 07/04/2017-Metastatic carcinoma consistent with a gynecologic primary ? 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 Depression  3. Hypothyroid  4. Family history of uterine cancer-paternal grandmother    Disposition:  Ms. Ojala underwent optimal debulking of a left fallopian tube carcinoma. She has recovered from surgery. I discussed adjuvant treatment options with her. I recommend Taxol/carboplatin chemotherapy.  She is scheduled to see Dr. Denman George later this week. We will plan to proceed with chemotherapy on 08/05/2017.  I reviewed the potential toxicities associated with the Taxol/carboplatin regimen including the chance for nausea/vomiting, mucositis,  alopecia, and hematologic toxicity. We discussed the allergic reaction, neuropathy, and bone pain associated with Taxol. She will be premedicated with Decadron prior to the  first cycle of chemotherapy.  We discussed potential side effects associated with Neulasta including the chance for bone pain, rash, and rupture of the spleen.  Ms. Duffy agrees to proceed.  She will return for a nadir CBC 08/14/2017. She will be scheduled for an office visit prior to cycle 2 on 08/26/2017.  Ms. Spikes will initially be treated with peripheral IV access. We will arrange for placement of a Port-A-Cath as needed.  30 minutes were spent with the patient today. The majority of the time was used for counseling and coordination of care.  Donneta Romberg, MD  07/28/2017  8:45 AM

## 2017-07-28 NOTE — Telephone Encounter (Signed)
Gave avs and calendar for September and october °

## 2017-07-28 NOTE — Progress Notes (Signed)
START ON PATHWAY REGIMEN - Ovarian     A cycle is every 21 days:     Paclitaxel      Carboplatin   **Always confirm dose/schedule in your pharmacy ordering system**    Patient Characteristics: Newly Diagnosed, Adjuvant Therapy, Stage IIB and Stage IIIA/B/C Optimal Cytoreduction AJCC T Category: Staged < 8th Ed. AJCC N Category: Staged < 8th Ed. AJCC M Category: Staged < 8th Ed. Therapeutic Status: Newly Diagnosed, Adjuvant Therapy AJCC 8 Stage Grouping: Staged < 8th Ed. Intent of Therapy: Curative Intent, Discussed with Patient

## 2017-07-29 ENCOUNTER — Other Ambulatory Visit: Payer: Self-pay

## 2017-07-29 MED ORDER — DEXAMETHASONE 2 MG PO TABS: 10.0000 mg | ORAL_TABLET | ORAL | 0 refills | Status: AC

## 2017-07-29 MED ORDER — PROCHLORPERAZINE MALEATE 10 MG PO TABS: 10.0000 mg | ORAL_TABLET | Freq: Four times a day (QID) | ORAL | 2 refills | Status: DC | PRN

## 2017-07-30 ENCOUNTER — Other Ambulatory Visit: Payer: Self-pay | Admitting: Gynecologic Oncology

## 2017-07-30 ENCOUNTER — Ambulatory Visit: Payer: 59 | Attending: Gynecologic Oncology | Admitting: Gynecologic Oncology

## 2017-07-30 ENCOUNTER — Other Ambulatory Visit (HOSPITAL_BASED_OUTPATIENT_CLINIC_OR_DEPARTMENT_OTHER): Payer: 59

## 2017-07-30 ENCOUNTER — Encounter: Payer: Self-pay | Admitting: *Deleted

## 2017-07-30 ENCOUNTER — Other Ambulatory Visit: Payer: 59

## 2017-07-30 ENCOUNTER — Encounter: Payer: Self-pay | Admitting: Gynecologic Oncology

## 2017-07-30 VITALS — BP 152/80 | HR 85 | Temp 97.5°F | Resp 18 | Wt 168.7 lb

## 2017-07-30 DIAGNOSIS — C786 Secondary malignant neoplasm of retroperitoneum and peritoneum: Secondary | ICD-10-CM | POA: Diagnosis present

## 2017-07-30 DIAGNOSIS — E039 Hypothyroidism, unspecified: Secondary | ICD-10-CM | POA: Insufficient documentation

## 2017-07-30 DIAGNOSIS — E7800 Pure hypercholesterolemia, unspecified: Secondary | ICD-10-CM | POA: Insufficient documentation

## 2017-07-30 DIAGNOSIS — F419 Anxiety disorder, unspecified: Secondary | ICD-10-CM | POA: Insufficient documentation

## 2017-07-30 DIAGNOSIS — I10 Essential (primary) hypertension: Secondary | ICD-10-CM | POA: Diagnosis not present

## 2017-07-30 DIAGNOSIS — C562 Malignant neoplasm of left ovary: Secondary | ICD-10-CM

## 2017-07-30 DIAGNOSIS — C7961 Secondary malignant neoplasm of right ovary: Secondary | ICD-10-CM

## 2017-07-30 DIAGNOSIS — C569 Malignant neoplasm of unspecified ovary: Secondary | ICD-10-CM | POA: Insufficient documentation

## 2017-07-30 DIAGNOSIS — Z79899 Other long term (current) drug therapy: Secondary | ICD-10-CM | POA: Insufficient documentation

## 2017-07-30 DIAGNOSIS — Z5189 Encounter for other specified aftercare: Secondary | ICD-10-CM

## 2017-07-30 DIAGNOSIS — K219 Gastro-esophageal reflux disease without esophagitis: Secondary | ICD-10-CM | POA: Insufficient documentation

## 2017-07-30 DIAGNOSIS — G4730 Sleep apnea, unspecified: Secondary | ICD-10-CM | POA: Insufficient documentation

## 2017-07-30 DIAGNOSIS — C799 Secondary malignant neoplasm of unspecified site: Secondary | ICD-10-CM

## 2017-07-30 DIAGNOSIS — F329 Major depressive disorder, single episode, unspecified: Secondary | ICD-10-CM | POA: Insufficient documentation

## 2017-07-30 LAB — CBC WITH DIFFERENTIAL/PLATELET
BASO%: 0.7 % (ref 0.0–2.0)
BASOS ABS: 0.1 10*3/uL (ref 0.0–0.1)
EOS ABS: 0.3 10*3/uL (ref 0.0–0.5)
EOS%: 3.1 % (ref 0.0–7.0)
HEMATOCRIT: 34.4 % — AB (ref 34.8–46.6)
HEMOGLOBIN: 11.5 g/dL — AB (ref 11.6–15.9)
LYMPH#: 3.1 10*3/uL (ref 0.9–3.3)
LYMPH%: 34.4 % (ref 14.0–49.7)
MCH: 30.4 pg (ref 25.1–34.0)
MCHC: 33.5 g/dL (ref 31.5–36.0)
MCV: 90.8 fL (ref 79.5–101.0)
MONO#: 0.9 10*3/uL (ref 0.1–0.9)
MONO%: 9.6 % (ref 0.0–14.0)
NEUT#: 4.6 10*3/uL (ref 1.5–6.5)
NEUT%: 52.2 % (ref 38.4–76.8)
PLATELETS: 492 10*3/uL — AB (ref 145–400)
RBC: 3.79 10*6/uL (ref 3.70–5.45)
RDW: 13.7 % (ref 11.2–14.5)
WBC: 8.9 10*3/uL (ref 3.9–10.3)

## 2017-07-30 LAB — COMPREHENSIVE METABOLIC PANEL
ALBUMIN: 3.3 g/dL — AB (ref 3.5–5.0)
ALT: 22 U/L (ref 0–55)
AST: 16 U/L (ref 5–34)
Alkaline Phosphatase: 88 U/L (ref 40–150)
Anion Gap: 10 mEq/L (ref 3–11)
BUN: 14.2 mg/dL (ref 7.0–26.0)
CHLORIDE: 104 meq/L (ref 98–109)
CO2: 25 meq/L (ref 22–29)
Calcium: 10.1 mg/dL (ref 8.4–10.4)
Creatinine: 0.9 mg/dL (ref 0.6–1.1)
EGFR: 74 mL/min/{1.73_m2} — ABNORMAL LOW (ref >90)
GLUCOSE: 94 mg/dL (ref 70–140)
Potassium: 4.1 mEq/L (ref 3.5–5.1)
SODIUM: 139 meq/L (ref 136–145)
Total Bilirubin: 0.29 mg/dL (ref 0.20–1.20)
Total Protein: 7.5 g/dL (ref 6.4–8.3)

## 2017-07-30 NOTE — Progress Notes (Signed)
Consult Note: Gyn-Onc  Anna [name] 61 y.o. female  CC:  Chief Complaint  Patient presents with  . Metastatic carcinoma (HCC)    HPI: Patient was seen in consultation at the request of Dr. Julieanne Manson. Primary physician Dr. Einar Pheasant.  Patient is a very pleasant 61 year old gravida 2 para 2 with menopause in her early 76s. She did take birth control pills on and off the proximally 10 years. She states that starting a few months ago she began experiencing some left lower quadrant pain. She has a known history of diverticulosis that has never had diverticulitis. Because of this persistent discomforts she saw Dr. Nicki Reaper who ordered a CT scan. The findings are as above.  IMPRESSION: 1. Soft tissue implants within the lower midline anterior peritoneal rim and along the peritoneal surfaces of both paracolic gutters and the left pelvic wall are most consistent with metastatic disease from an unknown primary. Lymphoma is also a consideration, but less likely. 2. No primary malignancy identified. PET CT and histologic sampling of the peritoneal disease are recommended. 3. Hepatic steatosis  She went to see Dr. Benay Spice as he took care of her husband for his colon cancer. Tumor markers were drawn and ultimately she underwent a biopsy.   CA-125 82.4, CEA <1.0 Diagnosis Omentum, biopsy - METASTATIC CARCINOMA, CONSISTENT WITH A GYNECOLOGIC PRIMARY. - SEE COMMENT. Microscopic Comment The malignant cells are positive for cytokeratin 7, p53, and PAX-8. They are negative for CDX-2 and cytokeratin 20. The findings are consistent with a primary gynecologic carcinoma. I spoke to Dr. 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 While there are some focal high-grade features is not quite clear.  Her last colonoscopy was 2 years ago. She is due now for her mammogram. Her last Pap smear was 2 years ago and it was normal.  Interval History: 07/17/17 Operative Note Procedure(s)  Performed: Diagnostic laparoscopy, exploratory laparotomy with total abdominal hysterectomy, bilateral salpingo-oophorectomy, omentectomy radical tumor debulking for ovarian cancer .  Surgeon: Thereasa Solo, MD.  Operative Findings: 10cm omental plaque/cake densely adherent to anterior lower abdominal peritoneum. Plaque of tumor (thin) coating left ovarian fossa with sigmoid somewhat adherent though easily separated. No disease on intestines. Minimal nodularity on left ovary and fallopian tube. Small peritoneal studding on diaphragm. No other visible or palpable tumor.     This represented an optimal cytoreduction (R1) with thin tumor plaque on posterior left peritoneum the only gross visible disease remaining.   Diagnosis 1. Omentum, resection for tumor - METASTATIC HIGH GRADE CARCINOMA. 2. Uterus +/- tubes/ovaries, neoplastic, with cervix - LEFT FALLOPIAN TUBE: HIGH GRADE SEROUS CARCINOMA. -SEE ONCOLOGY TABLE. - RIGHT FALLOPIAN TUBE: METASTATIC HIGH GRADE CARCINOMA. - BILATERAL OVARIES: METASTATIC HIGH GRADE CARCINOMA. - UTERUS: -ENDOMETRIUM: INACTIVE ENDOMETRIUM. NO HYPERPLASIA OR MALIGNANCY. -MYOMETRIUM: ADENOMYOSIS. LEIOMYOMA. NO MALIGNANCY. -SEROSA: UNREMARKABLE. NO MALIGNANCY. - CERVIX: BENIGN SQUAMOUS AND ENDOCERVICAL MUCOSA. NO DYSPLASIA OR MALIGNANCY.  She last saw Dr. Benay Spice on Monday, September 10. She comes in today for her postoperative visit.  She overall doing quite well and recovering well from surgery. She states her first couple days recurrent difficulties with some cramping but now she is feeling better and FAC that each day she is making for progress. She's had just a little bit of vaginal bleeding. Her pain is well-controlled. She has normal bowel and bladder function.  Current Meds:  Outpatient Encounter Prescriptions as of 07/30/2017  Medication Sig  . acyclovir (ZOVIRAX) 400 MG tablet Take one tablet tid for  five days prn flares.  Marland Kitchen acyclovir cream (ZOVIRAX) 5 %  Use as directed.  . benazepril-hydrochlorthiazide (LOTENSIN HCT) 20-12.5 MG tablet Take 1 tablet by mouth daily.  Marland Kitchen buPROPion (WELLBUTRIN XL) 300 MG 24 hr tablet TAKE 1 TABLET DAILY  . Calcium Carb-Cholecalciferol (CALCIUM 1000 + D PO) Take 1 tablet by mouth daily.  Marland Kitchen dexamethasone (DECADRON) 2 MG tablet Take 5 tablets (10 mg total) by mouth 1 day or 1 dose. Take 5 tablets (39m) at 10pm the night before and 10 mg at 6am the AM of first chemo  . enoxaparin (LOVENOX) 40 MG/0.4ML injection Inject 0.4 mLs (40 mg total) into the skin daily.  .Marland KitchenEPINEPHrine 0.3 mg/0.3 mL IJ SOAJ injection Inject 0.3 mLs (0.3 mg total) into the muscle as needed (for anaphylaxis).  .Marland Kitchenibuprofen (ADVIL,MOTRIN) 200 MG tablet Take 200 mg by mouth every 6 (six) hours as needed.  .Marland Kitchenlevothyroxine (SYNTHROID, LEVOTHROID) 88 MCG tablet TAKE 1 TABLET DAILY BEFORE BREAKFAST  . Multiple Vitamin (MULTIVITAMIN WITH MINERALS) TABS tablet Take 1 tablet by mouth daily.  .Marland Kitchenomeprazole (PRILOSEC) 20 MG capsule TAKE 1 CAPSULE TWICE A DAY BEFORE MEALS  . oxyCODONE-acetaminophen (PERCOCET/ROXICET) 5-325 MG tablet Take 1-2 tablets by mouth every 4 (four) hours as needed for severe pain.  .Marland Kitchenprochlorperazine (COMPAZINE) 10 MG tablet Take 1 tablet (10 mg total) by mouth every 6 (six) hours as needed for nausea or vomiting.  . rosuvastatin (CRESTOR) 5 MG tablet TAKE 1 TABLET DAILY   No facility-administered encounter medications on file as of 07/30/2017.     Allergy:  Allergies  Allergen Reactions  . Bee Venom Anaphylaxis  . Other Anaphylaxis    Fire ants     Social Hx:   Social History   Social History  . Marital status: Married    Spouse name: N/A  . Number of children: 2  . Years of education: N/A   Occupational History  . Not on file.   Social History Main Topics  . Smoking status: Never Smoker  . Smokeless tobacco: Never Used  . Alcohol use No  . Drug use: No  . Sexual activity: Yes   Other Topics Concern  . Not on  file   Social History Narrative  . No narrative on file    Past Surgical Hx:  Past Surgical History:  Procedure Laterality Date  . ABDOMINAL HYSTERECTOMY     Dr. RDenman George8/30/18  . APPENDECTOMY  1981  . [place]AL TUNNEL [place]  [place]  [place]   Lasix eye srg  . LAPAROSCOPY N/A 07/17/2017   Procedure: LAPAROSCOPY DIAGNOSTIC;  Surgeon: REveritt Amber MD;  Location: [facility];  Service: Gynecology;  Laterality: N/A;  . LAPAROTOMY N/A 07/17/2017   Procedure: EXPLORATORY LAPAROTOMY;  Surgeon: REveritt Amber MD;  Location: [facility];  Service: Gynecology;  Laterality: N/A;  . OMENTECTOMY N/A 07/17/2017   Procedure: OMENTECTOMY WITH RADICAL TUMOR DEBULKING;  Surgeon: REveritt Amber MD;  Location: [facility];  Service: Gynecology;  Laterality: N/A;  . TUBAL LIGATION  1985    Past Medical Hx:  Past Medical History:  Diagnosis Date  . Anxiety   . Arthritis   . Cancer ([facility]    gyenicological 2018  . Depression   . Diverticulosis   . Family history of adverse reaction to anesthesia    Mother has extreme [place] with anesthesia  . Frequent headaches    H/O  . GERD (gastroesophageal reflux  disease)   . Heart murmur    Hx of  . History of chicken pox   . Hypercholesterolemia   . Hypertension   . Hypothyroidism   . Pneumonia   . Sleep apnea    No longer wears cpap    Oncology Hx:    Carcinomatosis ([place])   07/07/2017 Initial Diagnosis    Carcinomatosis ([place])       Family Hx:  Family History  Problem Relation Age of Onset  . Hyperlipidemia Mother   . Hypertension Mother   . Alzheimer's disease Mother   . Arthritis Father   . Hyperlipidemia Father   . Heart disease Father        first MI age 64  . Hypertension Father   . Hyperlipidemia Brother   . Heart disease Brother        heart disease dx at a youg age  . Hypertension Brother   . Alzheimer's disease Maternal Aunt   . Hyperlipidemia Maternal Uncle   . Heart disease Maternal Uncle    . Sudden death Maternal Uncle 42       massive heart attack in doctors office  . Arthritis Maternal Grandmother   . Hyperlipidemia Maternal Grandmother   . Stroke Maternal Grandmother   . Hypertension Maternal Grandmother   . Alzheimer's disease Maternal Grandmother   . Alcohol abuse Maternal Grandfather   . Hypertension Maternal Grandfather   . Diabetes Maternal Grandfather   . Cancer Paternal Grandmother        Ovarian  . Hypertension Paternal Grandmother   . Alcohol abuse Paternal Grandfather   . Hyperlipidemia Paternal Grandfather   . Heart disease Paternal Grandfather   . Hypertension Paternal Grandfather   . Breast cancer Cousin        pat cousin    Vitals:  Blood pressure (!) 152/80, pulse 85, temperature (!) 97.5 F (36.4 C), temperature source Oral, resp. rate 18, weight 168 lb 11.2 oz (76.5 kg), SpO2 100 %.  Physical Exam:Well-nourished well-developed female in no acute distress.  Abdomen: Well-healed vertical midline incision.  Honeycomb dressing was removed. The incision is intact. Abdomen is soft. She does have some bruising secondary to her Lovenox injections as well as bruising around the incision.  Extremity: No edema.  Pelvic: External genitalia within normal limits. Vagina slightly atrophic. On speculum examination, the vaginal cuff is intact. There is minimal slightly bloody tinge didn't discharge. Bimanual examination reveals no vaginal cuff fullness or tenderness.  Assessment/Plan:  61 year old with stage IIIc ovarian carcinoma with an R1 resection. She has been seen by Dr. Benay Spice and is scheduled for chemotherapy. We would also recommend genetic testing and I placed an order for this. She is scheduled to receive paclitaxel and carboplatin. We will discuss with Dr. Benay Spice the role of bevacizumab at this time and then as maintenance therapy versus a clinical trial to evaluate the role of a PARP inhibitor as maintenance strategy. That trial is currently  not open but should be opened by the time she completes her chemotherapy. I'm not sure that will be open here but will be open at [facility]. Patients can be eligible for that trial if they receive their chemotherapy elsewhere but they just need to come to [facility] to be randomized and subsequently treated should she wish to proceed with a trial.  We appreciate the opportunity to partner in the care of this very pleasant patient. We are happy to see her as she nears completion of her therapy to discuss options  for maintenance strategies depending on what clinical trials may be available for her.  In addition to her postoperative check, and greater than 25 minutes face-to-face time was spent with the patient and her husband.  Nancy Marus A., MD 07/30/2017, 12:35 PM

## 2017-07-30 NOTE — Patient Instructions (Signed)
Plan to follow up with Dr. Benay Spice.  You will also meet with the genetics counselor. Please call for any questions or concerns.

## 2017-07-31 LAB — CA 125: Cancer Antigen (CA) 125: 121.1 U/mL — ABNORMAL HIGH (ref 0.0–38.1)

## 2017-08-03 ENCOUNTER — Other Ambulatory Visit: Payer: Self-pay | Admitting: Oncology

## 2017-08-05 ENCOUNTER — Ambulatory Visit (HOSPITAL_BASED_OUTPATIENT_CLINIC_OR_DEPARTMENT_OTHER): Payer: 59

## 2017-08-05 ENCOUNTER — Other Ambulatory Visit: Payer: Self-pay | Admitting: Internal Medicine

## 2017-08-05 VITALS — BP 108/66 | HR 91 | Temp 98.6°F | Resp 16

## 2017-08-05 DIAGNOSIS — C562 Malignant neoplasm of left ovary: Secondary | ICD-10-CM

## 2017-08-05 DIAGNOSIS — C7961 Secondary malignant neoplasm of right ovary: Secondary | ICD-10-CM | POA: Diagnosis not present

## 2017-08-05 DIAGNOSIS — Z5111 Encounter for antineoplastic chemotherapy: Secondary | ICD-10-CM | POA: Diagnosis not present

## 2017-08-05 DIAGNOSIS — C5702 Malignant neoplasm of left fallopian tube: Secondary | ICD-10-CM

## 2017-08-05 DIAGNOSIS — C786 Secondary malignant neoplasm of retroperitoneum and peritoneum: Secondary | ICD-10-CM

## 2017-08-05 MED ORDER — SODIUM CHLORIDE 0.9 % IV SOLN
Freq: Once | INTRAVENOUS | Status: AC
  Administered 2017-08-05: 08:00:00 via INTRAVENOUS

## 2017-08-05 MED ORDER — SODIUM CHLORIDE 0.9 % IV SOLN
528.0000 mg | Freq: Once | INTRAVENOUS | Status: AC
  Administered 2017-08-05: 530 mg via INTRAVENOUS
  Filled 2017-08-05: qty 53

## 2017-08-05 MED ORDER — PALONOSETRON HCL INJECTION 0.25 MG/5ML
0.2500 mg | Freq: Once | INTRAVENOUS | Status: AC
Start: 2017-08-05 — End: 2017-08-05
  Administered 2017-08-05: 0.25 mg via INTRAVENOUS

## 2017-08-05 MED ORDER — PALONOSETRON HCL INJECTION 0.25 MG/5ML
INTRAVENOUS | Status: AC
  Filled 2017-08-05: qty 5

## 2017-08-05 MED ORDER — FAMOTIDINE IN NACL 20-0.9 MG/50ML-% IV SOLN
INTRAVENOUS | Status: AC
  Filled 2017-08-05: qty 50

## 2017-08-05 MED ORDER — SODIUM CHLORIDE 0.9 % IV SOLN
20.0000 mg | Freq: Once | INTRAVENOUS | Status: AC
  Administered 2017-08-05: 20 mg via INTRAVENOUS
  Filled 2017-08-05: qty 2

## 2017-08-05 MED ORDER — SODIUM CHLORIDE 0.9% FLUSH
10.0000 mL | INTRAVENOUS | Status: DC | PRN
  Filled 2017-08-05: qty 10

## 2017-08-05 MED ORDER — DIPHENHYDRAMINE HCL 50 MG/ML IJ SOLN
INTRAMUSCULAR | Status: AC
  Filled 2017-08-05: qty 1

## 2017-08-05 MED ORDER — DIPHENHYDRAMINE HCL 50 MG/ML IJ SOLN
25.0000 mg | Freq: Once | INTRAMUSCULAR | Status: AC
  Administered 2017-08-05: 25 mg via INTRAVENOUS

## 2017-08-05 MED ORDER — FAMOTIDINE IN NACL 20-0.9 MG/50ML-% IV SOLN
20.0000 mg | Freq: Once | INTRAVENOUS | Status: AC
  Administered 2017-08-05: 20 mg via INTRAVENOUS

## 2017-08-05 MED ORDER — DEXTROSE 5 % IV SOLN
175.0000 mg/m2 | Freq: Once | INTRAVENOUS | Status: AC
  Administered 2017-08-05: 324 mg via INTRAVENOUS
  Filled 2017-08-05: qty 54

## 2017-08-05 MED ORDER — HEPARIN SOD (PORK) LOCK FLUSH 100 UNIT/ML IV SOLN
500.0000 [IU] | Freq: Once | INTRAVENOUS | Status: DC | PRN
  Filled 2017-08-05: qty 5

## 2017-08-05 NOTE — Patient Instructions (Addendum)
[facility] Discharge Instructions for Patients Receiving Chemotherapy  Today you received the following chemotherapy agents:  Taxol and Carboplatin.  To help prevent nausea and vomiting after your treatment, we encourage you to take your nausea medication as directed.  NO ZOFRAN FOR THE FIRST 3DAYS!!!!   If you develop nausea and vomiting that is not controlled by your nausea medication, call the clinic.   BELOW ARE SYMPTOMS THAT SHOULD BE REPORTED IMMEDIATELY:  *FEVER GREATER THAN 100.5 F  *CHILLS WITH OR WITHOUT FEVER  NAUSEA AND VOMITING THAT IS NOT CONTROLLED WITH YOUR NAUSEA MEDICATION  *UNUSUAL SHORTNESS OF BREATH  *UNUSUAL BRUISING OR BLEEDING  TENDERNESS IN MOUTH AND THROAT WITH OR WITHOUT PRESENCE OF ULCERS  *URINARY PROBLEMS  *BOWEL PROBLEMS  UNUSUAL RASH Items with * indicate a potential emergency and should be followed up as soon as possible.  Feel free to call the clinic you have any questions or concerns. The clinic phone number is [phone].  Please show the Rachel at check-in to the Emergency Department and triage nurse.  Paclitaxel injection What is this medicine? PACLITAXEL (PAK li TAX el) is a chemotherapy drug. It targets fast dividing cells, like cancer cells, and causes these cells to die. This medicine is used to treat ovarian cancer, breast cancer, and other cancers. This medicine may be used for other purposes; ask your health care provider or pharmacist if you have questions. COMMON BRAND NAME(S): Onxol, Taxol What should I tell my health care provider before I take this medicine? They need to know if you have any of these conditions: -blood disorders -irregular heartbeat -infection (especially a virus infection such as chickenpox, cold sores, or herpes) -liver disease -previous or ongoing radiation therapy -an unusual or allergic reaction to paclitaxel, alcohol, polyoxyethylated castor oil, other chemotherapy  agents, other medicines, foods, dyes, or preservatives -pregnant or trying to get pregnant -breast-feeding How should I use this medicine? This drug is given as an infusion into a vein. It is administered in a hospital or clinic by a specially trained health care professional. Talk to your pediatrician regarding the use of this medicine in children. Special care may be needed. Overdosage: If you think you have taken too much of this medicine contact a poison control center or emergency room at once. NOTE: This medicine is only for you. Do not share this medicine with others. What if I miss a dose? It is important not to miss your dose. Call your doctor or health care professional if you are unable to keep an appointment. What may interact with this medicine? Do not take this medicine with any of the following medications: -disulfiram -metronidazole This medicine may also interact with the following medications: -cyclosporine -diazepam -ketoconazole -medicines to increase blood counts like filgrastim, pegfilgrastim, sargramostim -other chemotherapy drugs like cisplatin, doxorubicin, epirubicin, etoposide, teniposide, vincristine -quinidine -testosterone -vaccines -verapamil Talk to your doctor or health care professional before taking any of these medicines: -acetaminophen -aspirin -ibuprofen -ketoprofen -naproxen This list may not describe all possible interactions. Give your health care provider a list of all the medicines, herbs, non-prescription drugs, or dietary supplements you use. Also tell them if you smoke, drink alcohol, or use illegal drugs. Some items may interact with your medicine. What should I watch for while using this medicine? Your condition will be monitored carefully while you are receiving this medicine. You will need important blood work done while you are taking this medicine. This medicine can cause serious allergic reactions.  To reduce your risk you will need  to take other medicine(s) before treatment with this medicine. If you experience allergic reactions like skin rash, itching or hives, swelling of the face, lips, or tongue, tell your doctor or health care professional right away. In some cases, you may be given additional medicines to help with side effects. Follow all directions for their use. This drug may make you feel generally unwell. This is not uncommon, as chemotherapy can affect healthy cells as well as cancer cells. Report any side effects. Continue your course of treatment even though you feel ill unless your doctor tells you to stop. Call your doctor or health care professional for advice if you get a fever, chills or sore throat, or other symptoms of a cold or flu. Do not treat yourself. This drug decreases your body's ability to fight infections. Try to avoid being around people who are sick. This medicine may increase your risk to bruise or bleed. Call your doctor or health care professional if you notice any unusual bleeding. Be careful brushing and flossing your teeth or using a toothpick because you may get an infection or bleed more easily. If you have any dental work done, tell your dentist you are receiving this medicine. Avoid taking products that contain aspirin, acetaminophen, ibuprofen, naproxen, or ketoprofen unless instructed by your doctor. These medicines may hide a fever. Do not become pregnant while taking this medicine. Women should inform their doctor if they wish to become pregnant or think they might be pregnant. There is a potential for serious side effects to an unborn child. Talk to your health care professional or pharmacist for more information. Do not breast-feed an infant while taking this medicine. Men are advised not to father a child while receiving this medicine. This product may contain alcohol. Ask your pharmacist or healthcare provider if this medicine contains alcohol. Be sure to tell all healthcare  providers you are taking this medicine. Certain medicines, like metronidazole and disulfiram, can cause an unpleasant reaction when taken with alcohol. The reaction includes flushing, headache, nausea, vomiting, sweating, and increased thirst. The reaction can last from 30 minutes to several hours. What side effects may I notice from receiving this medicine? Side effects that you should report to your doctor or health care professional as soon as possible: -allergic reactions like skin rash, itching or hives, swelling of the face, lips, or tongue -low blood counts - This drug may decrease the number of white blood cells, red blood cells and platelets. You may be at increased risk for infections and bleeding. -signs of infection - fever or chills, cough, sore throat, pain or difficulty passing urine -signs of decreased platelets or bleeding - bruising, pinpoint red spots on the skin, black, tarry stools, nosebleeds -signs of decreased red blood cells - unusually weak or tired, fainting spells, lightheadedness -breathing problems -chest pain -high or low blood pressure -mouth sores -nausea and vomiting -pain, swelling, redness or irritation at the injection site -pain, tingling, numbness in the hands or feet -slow or irregular heartbeat -swelling of the ankle, feet, hands Side effects that usually do not require medical attention (report to your doctor or health care professional if they continue or are bothersome): -bone pain -complete hair loss including hair on your head, underarms, pubic hair, eyebrows, and eyelashes -changes in the color of fingernails -diarrhea -loosening of the fingernails -loss of appetite -muscle or joint pain -red flush to skin -sweating This list may not describe all possible side  effects. Call your doctor for medical advice about side effects. You may report side effects to FDA at 1-800-FDA-1088. Where should I keep my medicine? This drug is given in a hospital  or clinic and will not be stored at home. NOTE: This sheet is a summary. It may not cover all possible information. If you have questions about this medicine, talk to your doctor, pharmacist, or health care provider.  2018 Elsevier/Gold Standard (2015-09-05 19:58:00)  Carboplatin injection What is this medicine? CARBOPLATIN (KAR boe pla tin) is a chemotherapy drug. It targets fast dividing cells, like cancer cells, and causes these cells to die. This medicine is used to treat ovarian cancer and many other cancers. This medicine may be used for other purposes; ask your health care provider or pharmacist if you have questions. COMMON BRAND NAME(S): Paraplatin What should I tell my health care provider before I take this medicine? They need to know if you have any of these conditions: -blood disorders -hearing problems -kidney disease -recent or ongoing radiation therapy -an unusual or allergic reaction to carboplatin, cisplatin, other chemotherapy, other medicines, foods, dyes, or preservatives -pregnant or trying to get pregnant -breast-feeding How should I use this medicine? This drug is usually given as an infusion into a vein. It is administered in a hospital or clinic by a specially trained health care professional. Talk to your pediatrician regarding the use of this medicine in children. Special care may be needed. Overdosage: If you think you have taken too much of this medicine contact a poison control center or emergency room at once. NOTE: This medicine is only for you. Do not share this medicine with others. What if I miss a dose? It is important not to miss a dose. Call your doctor or health care professional if you are unable to keep an appointment. What may interact with this medicine? -medicines for seizures -medicines to increase blood counts like filgrastim, pegfilgrastim, sargramostim -some antibiotics like amikacin, gentamicin, neomycin, streptomycin,  tobramycin -vaccines Talk to your doctor or health care professional before taking any of these medicines: -acetaminophen -aspirin -ibuprofen -ketoprofen -naproxen This list may not describe all possible interactions. Give your health care provider a list of all the medicines, herbs, non-prescription drugs, or dietary supplements you use. Also tell them if you smoke, drink alcohol, or use illegal drugs. Some items may interact with your medicine. What should I watch for while using this medicine? Your condition will be monitored carefully while you are receiving this medicine. You will need important blood work done while you are taking this medicine. This drug may make you feel generally unwell. This is not uncommon, as chemotherapy can affect healthy cells as well as cancer cells. Report any side effects. Continue your course of treatment even though you feel ill unless your doctor tells you to stop. In some cases, you may be given additional medicines to help with side effects. Follow all directions for their use. Call your doctor or health care professional for advice if you get a fever, chills or sore throat, or other symptoms of a cold or flu. Do not treat yourself. This drug decreases your body's ability to fight infections. Try to avoid being around people who are sick. This medicine may increase your risk to bruise or bleed. Call your doctor or health care professional if you notice any unusual bleeding. Be careful brushing and flossing your teeth or using a toothpick because you may get an infection or bleed more easily. If you have  any dental work done, tell your dentist you are receiving this medicine. Avoid taking products that contain aspirin, acetaminophen, ibuprofen, naproxen, or ketoprofen unless instructed by your doctor. These medicines may hide a fever. Do not become pregnant while taking this medicine. Women should inform their doctor if they wish to become pregnant or think  they might be pregnant. There is a potential for serious side effects to an unborn child. Talk to your health care professional or pharmacist for more information. Do not breast-feed an infant while taking this medicine. What side effects may I notice from receiving this medicine? Side effects that you should report to your doctor or health care professional as soon as possible: -allergic reactions like skin rash, itching or hives, swelling of the face, lips, or tongue -signs of infection - fever or chills, cough, sore throat, pain or difficulty passing urine -signs of decreased platelets or bleeding - bruising, pinpoint red spots on the skin, black, tarry stools, nosebleeds -signs of decreased red blood cells - unusually weak or tired, fainting spells, lightheadedness -breathing problems -changes in hearing -changes in vision -chest pain -high blood pressure -low blood counts - This drug may decrease the number of white blood cells, red blood cells and platelets. You may be at increased risk for infections and bleeding. -nausea and vomiting -pain, swelling, redness or irritation at the injection site -pain, tingling, numbness in the hands or feet -problems with balance, talking, walking -trouble passing urine or change in the amount of urine Side effects that usually do not require medical attention (report to your doctor or health care professional if they continue or are bothersome): -hair loss -loss of appetite -metallic taste in the mouth or changes in taste This list may not describe all possible side effects. Call your doctor for medical advice about side effects. You may report side effects to FDA at 1-800-FDA-1088. Where should I keep my medicine? This drug is given in a hospital or clinic and will not be stored at home. NOTE: This sheet is a summary. It may not cover all possible information. If you have questions about this medicine, talk to your doctor, pharmacist, or health care  provider.  2018 Elsevier/Gold Standard (2008-02-09 14:38:05)

## 2017-08-06 ENCOUNTER — Telehealth: Payer: Self-pay

## 2017-08-06 NOTE — Telephone Encounter (Signed)
Call placed to back to patient as requested. Pt states that her cheeks have been red since her infusion yesterday. She reports that the rash is not raised, and that her cheeks are not hot or burn. This nurse informed the patient that her red cheeks are probably due to the steroid dose that she received before her chemotherapy infusion yesterday. Informed the patient that the red cheeks are an expected side effect and that they will resolve without any intervention. Pt verbalized understanding and appreciated call back. Pt states that she will call Tira with any further concerns.

## 2017-08-07 ENCOUNTER — Telehealth: Payer: Self-pay

## 2017-08-07 NOTE — Telephone Encounter (Signed)
Pt had chemo C1 on  9/18. She is having constipation, stool is coming out like pellets in small amounts. Instructed pt on colace and miralax, starting 1 dose each per day and adjusting as needed. Mag citrate if that does not help.  F/u on chemo, she is drinking her 64 oz per day. She has no nausea nor any other complaints. She states her redness on face is gone.

## 2017-08-14 ENCOUNTER — Encounter: Payer: Self-pay | Admitting: Oncology

## 2017-08-14 ENCOUNTER — Other Ambulatory Visit (HOSPITAL_BASED_OUTPATIENT_CLINIC_OR_DEPARTMENT_OTHER): Payer: 59

## 2017-08-14 DIAGNOSIS — C786 Secondary malignant neoplasm of retroperitoneum and peritoneum: Secondary | ICD-10-CM | POA: Diagnosis not present

## 2017-08-14 DIAGNOSIS — C7961 Secondary malignant neoplasm of right ovary: Secondary | ICD-10-CM

## 2017-08-14 DIAGNOSIS — C562 Malignant neoplasm of left ovary: Secondary | ICD-10-CM | POA: Diagnosis not present

## 2017-08-14 DIAGNOSIS — C569 Malignant neoplasm of unspecified ovary: Secondary | ICD-10-CM

## 2017-08-14 LAB — CBC WITH DIFFERENTIAL/PLATELET
BASO%: 0.5 % (ref 0.0–2.0)
Basophils Absolute: 0 10*3/uL (ref 0.0–0.1)
EOS%: 3.2 % (ref 0.0–7.0)
Eosinophils Absolute: 0.1 10*3/uL (ref 0.0–0.5)
HEMATOCRIT: 32.3 % — AB (ref 34.8–46.6)
HEMOGLOBIN: 10.8 g/dL — AB (ref 11.6–15.9)
LYMPH#: 2.2 10*3/uL (ref 0.9–3.3)
LYMPH%: 58.9 % — ABNORMAL HIGH (ref 14.0–49.7)
MCH: 30.4 pg (ref 25.1–34.0)
MCHC: 33.5 g/dL (ref 31.5–36.0)
MCV: 90.6 fL (ref 79.5–101.0)
MONO#: 0.3 10*3/uL (ref 0.1–0.9)
MONO%: 8.6 % (ref 0.0–14.0)
NEUT%: 28.8 % — ABNORMAL LOW (ref 38.4–76.8)
NEUTROS ABS: 1.1 10*3/uL — AB (ref 1.5–6.5)
Platelets: 257 10*3/uL (ref 145–400)
RBC: 3.57 10*6/uL — ABNORMAL LOW (ref 3.70–5.45)
RDW: 13.6 % (ref 11.2–14.5)
WBC: 3.7 10*3/uL — ABNORMAL LOW (ref 3.9–10.3)

## 2017-08-14 NOTE — Progress Notes (Signed)
Met w/ pt to introduce myself as her Arboriculturist.  Informed her unfortunately there aren't any foundations offering copay assistance for her Dx but offered the [facility] and [employer].  After going over what they cover and the income requirement, pt stated she is over qualified for the grants.  That being the case we cannot offer any assistance at this time but I gave her my card to contact me with any questions or concerns she may have in the future.

## 2017-08-15 ENCOUNTER — Telehealth: Payer: Self-pay

## 2017-08-15 NOTE — Telephone Encounter (Signed)
Call placed to patient to review her lab results per MD Sherrill. Pt verbalizes understanding of hand hygeine, to avoid crowds and to call for any fever. Pt agrees with plan of care.

## 2017-08-15 NOTE — Telephone Encounter (Signed)
-----   Message from Ladell Pier, MD sent at 08/14/2017  5:28 PM EDT ----- Please call patient, white blood cells are mildly low, call for fever, follow-up as scheduled

## 2017-08-18 ENCOUNTER — Encounter: Payer: 59 | Admitting: Genetics

## 2017-08-19 ENCOUNTER — Other Ambulatory Visit: Payer: Self-pay | Admitting: Oncology

## 2017-08-20 ENCOUNTER — Telehealth: Payer: Self-pay | Admitting: Oncology

## 2017-08-20 NOTE — Telephone Encounter (Signed)
Called patient regarding 10/9 she really wants a 10am infusion. Patient has to get a ride.

## 2017-08-26 ENCOUNTER — Ambulatory Visit (HOSPITAL_BASED_OUTPATIENT_CLINIC_OR_DEPARTMENT_OTHER): Payer: 59 | Admitting: Oncology

## 2017-08-26 ENCOUNTER — Ambulatory Visit (HOSPITAL_BASED_OUTPATIENT_CLINIC_OR_DEPARTMENT_OTHER): Payer: 59

## 2017-08-26 ENCOUNTER — Ambulatory Visit: Payer: 59 | Admitting: Oncology

## 2017-08-26 ENCOUNTER — Ambulatory Visit: Payer: 59

## 2017-08-26 ENCOUNTER — Other Ambulatory Visit (HOSPITAL_BASED_OUTPATIENT_CLINIC_OR_DEPARTMENT_OTHER): Payer: 59

## 2017-08-26 ENCOUNTER — Telehealth: Payer: Self-pay | Admitting: Oncology

## 2017-08-26 VITALS — BP 134/76 | HR 96 | Temp 98.0°F | Resp 18 | Ht 63.0 in | Wt 168.7 lb

## 2017-08-26 DIAGNOSIS — C5702 Malignant neoplasm of left fallopian tube: Secondary | ICD-10-CM

## 2017-08-26 DIAGNOSIS — Z23 Encounter for immunization: Secondary | ICD-10-CM

## 2017-08-26 DIAGNOSIS — C7962 Secondary malignant neoplasm of left ovary: Secondary | ICD-10-CM

## 2017-08-26 DIAGNOSIS — Z5111 Encounter for antineoplastic chemotherapy: Secondary | ICD-10-CM | POA: Diagnosis not present

## 2017-08-26 DIAGNOSIS — C7961 Secondary malignant neoplasm of right ovary: Secondary | ICD-10-CM

## 2017-08-26 DIAGNOSIS — C7982 Secondary malignant neoplasm of genital organs: Secondary | ICD-10-CM

## 2017-08-26 DIAGNOSIS — C786 Secondary malignant neoplasm of retroperitoneum and peritoneum: Secondary | ICD-10-CM | POA: Diagnosis not present

## 2017-08-26 DIAGNOSIS — C569 Malignant neoplasm of unspecified ovary: Secondary | ICD-10-CM

## 2017-08-26 LAB — COMPREHENSIVE METABOLIC PANEL
ALT: 19 U/L (ref 0–55)
AST: 14 U/L (ref 5–34)
Albumin: 3.7 g/dL (ref 3.5–5.0)
Alkaline Phosphatase: 82 U/L (ref 40–150)
Anion Gap: 12 mEq/L — ABNORMAL HIGH (ref 3–11)
BUN: 14.6 mg/dL (ref 7.0–26.0)
CALCIUM: 9.5 mg/dL (ref 8.4–10.4)
CHLORIDE: 107 meq/L (ref 98–109)
CO2: 21 mEq/L — ABNORMAL LOW (ref 22–29)
Creatinine: 0.9 mg/dL (ref 0.6–1.1)
EGFR: 73 mL/min/{1.73_m2} — ABNORMAL LOW (ref >90)
Glucose: 123 mg/dl (ref 70–140)
POTASSIUM: 3.8 meq/L (ref 3.5–5.1)
Sodium: 139 mEq/L (ref 136–145)
Total Bilirubin: 0.27 mg/dL (ref 0.20–1.20)
Total Protein: 7.4 g/dL (ref 6.4–8.3)

## 2017-08-26 LAB — CBC WITH DIFFERENTIAL/PLATELET
BASO%: 0.8 % (ref 0.0–2.0)
Basophils Absolute: 0.1 10*3/uL (ref 0.0–0.1)
EOS%: 1.8 % (ref 0.0–7.0)
Eosinophils Absolute: 0.1 10*3/uL (ref 0.0–0.5)
HCT: 36.5 % (ref 34.8–46.6)
HGB: 12.2 g/dL (ref 11.6–15.9)
LYMPH%: 35.8 % (ref 14.0–49.7)
MCH: 30.4 pg (ref 25.1–34.0)
MCHC: 33.3 g/dL (ref 31.5–36.0)
MCV: 91.3 fL (ref 79.5–101.0)
MONO#: 0.6 10*3/uL (ref 0.1–0.9)
MONO%: 8.3 % (ref 0.0–14.0)
NEUT%: 53.3 % (ref 38.4–76.8)
NEUTROS ABS: 4 10*3/uL (ref 1.5–6.5)
PLATELETS: 290 10*3/uL (ref 145–400)
RBC: 3.99 10*6/uL (ref 3.70–5.45)
RDW: 15.4 % — ABNORMAL HIGH (ref 11.2–14.5)
WBC: 7.5 10*3/uL (ref 3.9–10.3)
lymph#: 2.7 10*3/uL (ref 0.9–3.3)

## 2017-08-26 MED ORDER — DIPHENHYDRAMINE HCL 50 MG/ML IJ SOLN
25.0000 mg | Freq: Once | INTRAMUSCULAR | Status: AC
  Administered 2017-08-26: 25 mg via INTRAVENOUS

## 2017-08-26 MED ORDER — TRAMADOL HCL 50 MG PO TABS: 50.0000 mg | ORAL_TABLET | Freq: Four times a day (QID) | ORAL | 0 refills | Status: DC | PRN

## 2017-08-26 MED ORDER — SODIUM CHLORIDE 0.9 % IV SOLN
528.0000 mg | Freq: Once | INTRAVENOUS | Status: AC
  Administered 2017-08-26: 530 mg via INTRAVENOUS
  Filled 2017-08-26: qty 53

## 2017-08-26 MED ORDER — SODIUM CHLORIDE 0.9 % IV SOLN
175.0000 mg/m2 | Freq: Once | INTRAVENOUS | Status: AC
  Administered 2017-08-26: 324 mg via INTRAVENOUS
  Filled 2017-08-26: qty 54

## 2017-08-26 MED ORDER — INFLUENZA VAC SPLIT QUAD 0.5 ML IM SUSY
0.5000 mL | PREFILLED_SYRINGE | Freq: Once | INTRAMUSCULAR | Status: AC
  Administered 2017-08-26: 0.5 mL via INTRAMUSCULAR
  Filled 2017-08-26: qty 0.5

## 2017-08-26 MED ORDER — FAMOTIDINE IN NACL 20-0.9 MG/50ML-% IV SOLN
20.0000 mg | Freq: Once | INTRAVENOUS | Status: AC
  Administered 2017-08-26: 20 mg via INTRAVENOUS

## 2017-08-26 MED ORDER — DIPHENHYDRAMINE HCL 50 MG/ML IJ SOLN
INTRAMUSCULAR | Status: AC
  Filled 2017-08-26: qty 1

## 2017-08-26 MED ORDER — SODIUM CHLORIDE 0.9 % IV SOLN
Freq: Once | INTRAVENOUS | Status: AC
Start: 2017-08-26 — End: 2017-08-26
  Administered 2017-08-26: 10:00:00 via INTRAVENOUS

## 2017-08-26 MED ORDER — PALONOSETRON HCL INJECTION 0.25 MG/5ML
0.2500 mg | Freq: Once | INTRAVENOUS | Status: AC
  Administered 2017-08-26: 0.25 mg via INTRAVENOUS

## 2017-08-26 MED ORDER — SODIUM CHLORIDE 0.9 % IV SOLN
20.0000 mg | Freq: Once | INTRAVENOUS | Status: AC
  Administered 2017-08-26: 20 mg via INTRAVENOUS
  Filled 2017-08-26: qty 2

## 2017-08-26 MED ORDER — FAMOTIDINE IN NACL 20-0.9 MG/50ML-% IV SOLN
INTRAVENOUS | Status: AC
  Filled 2017-08-26: qty 50

## 2017-08-26 MED ORDER — PALONOSETRON HCL INJECTION 0.25 MG/5ML
INTRAVENOUS | Status: AC
  Filled 2017-08-26: qty 5

## 2017-08-26 NOTE — Patient Instructions (Signed)
[facility] Discharge Instructions for Patients Receiving Chemotherapy  Today you received the following chemotherapy agents:  Taxol, Carboplatin  To help prevent nausea and vomiting after your treatment, we encourage you to take your nausea medication as prescribed.   If you develop nausea and vomiting that is not controlled by your nausea medication, call the clinic.   BELOW ARE SYMPTOMS THAT SHOULD BE REPORTED IMMEDIATELY:  *FEVER GREATER THAN 100.5 F  *CHILLS WITH OR WITHOUT FEVER  NAUSEA AND VOMITING THAT IS NOT CONTROLLED WITH YOUR NAUSEA MEDICATION  *UNUSUAL SHORTNESS OF BREATH  *UNUSUAL BRUISING OR BLEEDING  TENDERNESS IN MOUTH AND THROAT WITH OR WITHOUT PRESENCE OF ULCERS  *URINARY PROBLEMS  *BOWEL PROBLEMS  UNUSUAL RASH Items with * indicate a potential emergency and should be followed up as soon as possible.  Feel free to call the clinic should you have any questions or concerns. The clinic phone number is [phone].  Please show the CHEMO ALERT CARD at check-in to the Emergency Department and triage nurse.   

## 2017-08-26 NOTE — Progress Notes (Signed)
  [facility] OFFICE PROGRESS NOTE   Diagnosis: Fallopian tube carcinoma  INTERVAL HISTORY:   Anna [name] returns as scheduled. She completed cycle 1 Taxol/carboplatin 08/05/2017. She reports aching in the muscles lasting 4-5 days following chemotherapy. She took Tylenol for the discomfort. Mild intermittent numbness in the fingers. This does not interfere with activity.  Objective:  Vital signs in last 24 hours:  Blood pressure 134/76, pulse 96, temperature 98 F (36.7 C), resp. rate 18, height 5\' 3"  (1.6 m), weight 168 lb 11.2 oz (76.5 kg).    HEENT: No thrush or ulcers Resp: Lungs clear bilaterally Cardio: Regular rate and rhythm GI: No hepatosplenomegaly, no mass, no apparent ascites, nontender, resolving ecchymoses over the abdominal wall Vascular: No leg edema   Portacath/PICC-without erythema  Lab Results:  Lab Results  Component Value Date   WBC 7.5 08/26/2017   HGB 12.2 08/26/2017   HCT 36.5 08/26/2017   MCV 91.3 08/26/2017   PLT 290 08/26/2017   NEUTROABS 4.0 08/26/2017    CMP     Component Value Date/Time   NA 139 08/26/2017 0836   K 3.8 08/26/2017 0836   CL 104 07/18/2017 0505   CO2 21 (L) 08/26/2017 0836   GLUCOSE 123 08/26/2017 0836   BUN 14.6 08/26/2017 0836   CREATININE 0.9 08/26/2017 0836   CALCIUM 9.5 08/26/2017 0836   PROT 7.4 08/26/2017 0836   ALBUMIN 3.7 08/26/2017 0836   AST 14 08/26/2017 0836   ALT 19 08/26/2017 0836   ALKPHOS 82 08/26/2017 0836   BILITOT 0.27 08/26/2017 0836   GFRNONAA >60 07/18/2017 0505   GFRAA >60 07/18/2017 0505     Medications: I have reviewed the patient's current medications.  Assessment/Plan: 1. Left abdomen/pelvic pain ? CT abdomen/pelvis 06/26/2017-soft tissue implants in the lower anterior peritoneum with implants at the paracolic gutters and left pelvic sidewall ? Elevated CA 125 ? CT biopsy of anterior omental mass 07/04/2017-Metastatic carcinoma consistent with a gynecologic  primary ? 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 Cycle 1 adjuvant Taxol/carboplatin 08/05/2017 ? Cycle 2 adjuvant Taxol/carboplatin 08/26/2017  2. Depression  3. Hypothyroid  4. Family history of uterine cancer-paternal grandmother    Disposition:  Anna [name] tolerated the first cycle of chemotherapy well. She will use Tylenol, ibuprofen, and tramadol as needed for pain following this cycle of chemotherapy. The plan is to proceed with cycle 2 Taxol/carboplatin today. She will return for an office visit and cycle 3 chemotherapy on 09/17/2017.  She received an influenza vaccine today.  Donneta Romberg, MD  08/26/2017  2:35 PM

## 2017-08-26 NOTE — Telephone Encounter (Signed)
Scheduled appt per 10/9 los - Gave patient AVS and calender per los.  

## 2017-08-27 LAB — CA 125: CANCER ANTIGEN (CA) 125: 33.3 U/mL (ref 0.0–38.1)

## 2017-09-01 ENCOUNTER — Telehealth: Payer: Self-pay | Admitting: *Deleted

## 2017-09-01 ENCOUNTER — Encounter: Payer: Self-pay | Admitting: Genetics

## 2017-09-01 ENCOUNTER — Other Ambulatory Visit: Payer: 59

## 2017-09-01 NOTE — Telephone Encounter (Signed)
Returned patient's call regarding her genetics appt. Patient wanted to make sure the appt did/didn't need pre-certification. Per Santiago Glad from genetics, the appt doesn't need to be per-certificated.

## 2017-09-09 ENCOUNTER — Encounter: Payer: Self-pay | Admitting: Genetics

## 2017-09-09 ENCOUNTER — Ambulatory Visit (HOSPITAL_BASED_OUTPATIENT_CLINIC_OR_DEPARTMENT_OTHER): Payer: 59 | Admitting: Genetics

## 2017-09-09 ENCOUNTER — Other Ambulatory Visit: Payer: 59

## 2017-09-09 DIAGNOSIS — Z8041 Family history of malignant neoplasm of ovary: Secondary | ICD-10-CM

## 2017-09-09 DIAGNOSIS — Z803 Family history of malignant neoplasm of breast: Secondary | ICD-10-CM

## 2017-09-09 DIAGNOSIS — C5702 Malignant neoplasm of left fallopian tube: Secondary | ICD-10-CM

## 2017-09-09 DIAGNOSIS — C569 Malignant neoplasm of unspecified ovary: Secondary | ICD-10-CM

## 2017-09-09 DIAGNOSIS — C7961 Secondary malignant neoplasm of right ovary: Secondary | ICD-10-CM | POA: Diagnosis not present

## 2017-09-09 NOTE — Progress Notes (Addendum)
REFERRING PROVIDER: Nancy Marus, MD [place], [place] [address]  PRIMARY PROVIDER:  Einar Pheasant, MD  PRIMARY REASON FOR VISIT:  1. Malignant neoplasm of left fallopian tube (HCC)   2. Family history of breast cancer   3. Malignant neoplasm of ovary, unspecified laterality ([place] [place])     HISTORY OF PRESENT ILLNESS:   Anna [name], a 61 y.o. female, was seen for a [facility] cancer genetics consultation at the request of Dr. Alycia Rossetti due to a personal and family history of cancer.  Ms. Co presents to clinic today to discuss the possibility of a hereditary predisposition to cancer, genetic testing, and to further clarify her future cancer risks, as well as potential cancer risks for family members.   In 2018 at the age of 74, [name] was diagnosed with carcinoma of the  Fallopian tube. She had a BSO/hysterectomy tumor debulking surgery on 07/17/2017.  She is currently undergoing chemotherapy treatment.    CANCER HISTORY:    Carcinomatosis ([facility])   07/07/2017 Initial Diagnosis    Carcinomatosis ([facility])        HORMONAL RISK FACTORS:  Menarche was at age 75.  First live birth at age 34.  OCP use for approximately 10 years.  Ovaries intact: no.  Hysterectomy: no.  Menopausal status: postmenopausal.  Colonoscopy: yes; reportedly normal.  Past Medical History:  Diagnosis Date  . Anxiety   . Arthritis   . Cancer [facility])    gyenicological 2018  . Depression   . Diverticulosis   . Family history of adverse reaction to anesthesia    Mother has extreme [place] with anesthesia  . Family history of breast cancer   . Frequent headaches    H/O  . GERD (gastroesophageal reflux disease)   . Heart murmur    Hx of  . History of chicken pox   . Hypercholesterolemia   . Hypertension   . Hypothyroidism   . Pneumonia   . Sleep apnea    No longer wears cpap    Past Surgical History:  Procedure Laterality Date  . ABDOMINAL HYSTERECTOMY     Dr. Denman George 07/17/17  .  APPENDECTOMY  1981  . CARPAL TUNNEL RELEASE  2000  . [place]  . EYE SURGERY Bilateral 2008   Lasix eye srg  . LAPAROSCOPY N/A 07/17/2017   Procedure: LAPAROSCOPY DIAGNOSTIC;  Surgeon: Everitt Amber, MD;  Location: [facility];  Service: Gynecology;  Laterality: N/A;  . LAPAROTOMY N/A 07/17/2017   Procedure: EXPLORATORY LAPAROTOMY;  Surgeon: Everitt Amber, MD;  Location: [facility];  Service: Gynecology;  Laterality: N/A;  . OMENTECTOMY N/A 07/17/2017   Procedure: OMENTECTOMY WITH RADICAL TUMOR DEBULKING;  Surgeon: Everitt Amber, MD;  Location: [facility];  Service: Gynecology;  Laterality: N/A;  . TUBAL LIGATION  1985    Social History   Social History  . Marital status: Married    Spouse name: N/A  . Number of children: 2  . Years of education: N/A   Social History Main Topics  . Smoking status: Never Smoker  . Smokeless tobacco: Never Used  . Alcohol use No  . Drug use: No  . Sexual activity: Yes   Other Topics Concern  . Not on file   Social History Narrative  . No narrative on file     FAMILY HISTORY:  We obtained a detailed, 4-generation family history.  Significant diagnoses are listed below: Family History  Problem Relation Age of Onset  . Hyperlipidemia Mother   .  Hypertension Mother   . Alzheimer's disease Mother   . Arthritis Father   . Hyperlipidemia Father   . Heart disease Father        first MI age 45  . Hypertension Father   . Hyperlipidemia Brother   . Heart disease Brother        heart disease dx at a youg age  . Hypertension Brother   . Alzheimer's disease Maternal Aunt   . Hyperlipidemia Maternal Uncle   . Heart disease Maternal Uncle   . Sudden death Maternal Uncle 42       massive heart attack in doctors office  . Arthritis Maternal Grandmother   . Hyperlipidemia Maternal Grandmother   . Stroke Maternal Grandmother   . Hypertension Maternal Grandmother   . Alzheimer's disease Maternal Grandmother   . Alcohol abuse Maternal Grandfather    . Hypertension Maternal Grandfather   . Diabetes Maternal Grandfather   . Cancer Paternal Grandmother        Ovarian  . Hypertension Paternal Grandmother   . Alcohol abuse Paternal Grandfather   . Hyperlipidemia Paternal Grandfather   . Heart disease Paternal Grandfather   . Hypertension Paternal Grandfather   . Breast cancer Cousin        pat cousin   Ms. Kennebrew has a 45 year-old son and a 42 year-old daughter with no history of cancer.  Her daughter reportedly had some genetic testing before her mother's diagnosis due to a paternal family history of cervical cancer.  Ms. Gildner reports her daughter had 'the lowest risk factor'.  This daughter has a 50 year-old son and an 54 year-old daughter.  Ms. Sarti' son has a 19 year-old son.   Ms. Bryngelson has 2 brothers: -1 brother is 79 with no history of cancer.  He has a son who is 12 and recently diagnosed with AML.   -1 brother is 56 with no history of cancer.  He had 3 children.  One son died at 32 due to an allergic reaction to fire an bites, one son is 73 with no history of cancer and one daughter is 68 and had cervical lesions and a diagnosis of lupus.   Ms. Gindlesperger' father died at 38 due to heart disease.  He had 1 sister who died in her 65's with her uterus and ovaries intact and no cancer. This sister had a daughter who had breast cancer in her late 61's.  Ms. Steveson' paternal grandfather died in his 61's with no history of cancer.  His mother died at 15 due to uterine cancer.  Ms. Meckley paternal grandmother died at 22 due to cervical and bladder cancer.    Ms. Stanly' mother died at 59 due to Alzheimer's Disease. She had her uterus and ovaries intact.  Ms. Hoar had 7 maternal aunts and uncles.  No history of cancer reported in these aunts and uncles.  All of them lived to ages 47+ with the exception of one uncle who died of a heart attack at 59.   Ms. Delange has several maternal cousins, none with any known history of  cancer.  Ms. Yono' maternal grandfather died in his 64's with no cancer, and her maternal grandmother die at 72 with no history of cancer.    Patient's maternal ancestors are of English descent, and paternal ancestors are of English/Irish descent. There is no reported Ashkenazi Jewish ancestry. There is no known consanguinity.  GENETIC COUNSELING ASSESSMENT: Jacelyn Sakiyah Shur is a 60 y.o. female with  a personal and family history which is somewhat suggestive of a Hereditary Cancer Predisposition Syndrome. We, therefore, discussed and recommended the following at today's visit.   DISCUSSION: We reviewed the characteristics, features and inheritance patterns of hereditary cancer syndromes. We also discussed genetic testing, including the appropriate family members to test, the process of testing, insurance coverage and turn-around-time for results. We discussed the implications of a negative, positive and/or variant of uncertain significant result. We recommended Ms. Krenz pursue genetic testing for the [facility] gene panel + [facility] HRD.   The [facility] gene panel offered by [employer] includes sequencing and deletion/duplication testing of the following 28 genes: APC, ATM, BARD1, BMPR1A, BRCA1, BRCA2, BRIP1, CHD1, CDK4, CDKN2A, CHEK2, EPCAM (large rearrangement only), MLH1, MSH2, MSH6, MUTYH, NBN, PALB2, PMS2, PTEN, RAD51C, RAD51D, SMAD4, STK11, and TP53. Sequencing was performed for select regions of POLE and POLD1, and large rearrangement analysis was performed for select regions of GREM1.  We discussed that 20-25% of ovarian/fallopian tube cancers are associated with a Hereditary cancer predisposition syndrome.  One of the most common hereditary cancer syndromes that increases ovarian cancer risk is called Hereditary Breast and Ovarian Cancer (HBOC) syndrome.  This syndrome is caused by mutations in the BRCA1 and BRCA2 genes.  This syndrome increases an individual's lifetime  risk to develop breast, ovarian, pancreatic, and other types of cancer.  There are also many other cancer predisposition syndromes caused by mutations in several other genes.  We discussed that if she is found to have a mutation in one of these genes, it may impact future medical management recommendations such as increased cancer screenings and consideration of risk reducing surgeries.  A positive result could also have implications for the patient's family members.  A Negative result would mean we were unable to identify a hereditary component to her cancer, but does not rule out the possibility of a hereditary basis for her  cancer.  There could be mutations that are undetectable by current technology, or in genes not yet tested or identified to increase cancer risk.    We discussed the potential to find a Variant of Uncertain Significance or VUS.  These are variants that have not yet been identified as pathogenic or benign, and it is unknown if this variant is associated with increased cancer risk or if this is a normal finding.  Most VUS's are reclassified to benign or likely benign.   It should not be used to make medical management decisions. With time, we suspect the lab will determine the significance of any VUS's identified if any.   We discussed that genetic testing through [facility] will test for hereditary mutations that could explain her diagnosis of cancer.  However, homologous recombination testing (HRD) is genetic testing performed on her tumor that can determine genetic changes that could influence her management.  HRD testing is performed in tandem with genetic testing, and typically at no additional cost.   Based on Ms. Braud's personal and family history of cancer, she meets medical criteria for genetic testing. Despite that she meets criteria, she may still have an out of pocket cost. We discussed that if her out of pocket cost for testing is over $100, the laboratory will call and  confirm whether she wants to proceed with testing.  If the out of pocket cost of testing is less than $100 she will be billed by the genetic testing laboratory.   PLAN: After considering the risks, benefits, and limitations, Ms. Laconte  provided informed consent  to pursue genetic testing and the blood sample was sent to [facility] for analysis of the [facility] + [facility] HRD tests. Results of germline genetic testing should be available within approximately 2-3 weeks' time, at which point they will be disclosed by telephone to Ms. Welle, as will any additional recommendations warranted by these results.  HRD tumor testing can take longer (1-1.5 months) Ms. Riso will receive a summary of her genetic counseling visit and a copy of her results once available. This information will also be available in Epic. We encouraged Ms. Erman to remain in contact with cancer genetics annually so that we can continuously update the family history and inform her of any changes in cancer genetics and testing that may be of benefit for her family. Ms. Oscarson questions were answered to her satisfaction today. Our contact information was provided should additional questions or concerns arise.  Based on Ms. Borak's family history, we recommended her cousin, who was diagnosed with breast cancer in her late 46's, have genetic counseling and testing. Ms. Lozada will let us know if we can be of any assistance in coordinating genetic counseling and/or testing for this family member.   Lastly, we encouraged Ms. Gullikson to remain in contact with cancer genetics annually so that we can continuously update the family history and inform her of any changes in cancer genetics and testing that may be of benefit for this family.   Ms.  Steelman questions were answered to her satisfaction today. Our contact information was provided should additional questions or concerns arise. Thank you for the referral and  allowing [place] to share in the care of your patient.   Tana Felts, MS Genetic Counselor [email or web address] phone: [phone]  The patient was seen for a total of 35 minutes in face-to-face genetic counseling.

## 2017-09-13 ENCOUNTER — Other Ambulatory Visit: Payer: Self-pay | Admitting: Oncology

## 2017-09-17 ENCOUNTER — Other Ambulatory Visit (HOSPITAL_BASED_OUTPATIENT_CLINIC_OR_DEPARTMENT_OTHER): Payer: 59

## 2017-09-17 ENCOUNTER — Ambulatory Visit (HOSPITAL_BASED_OUTPATIENT_CLINIC_OR_DEPARTMENT_OTHER): Payer: 59 | Admitting: Oncology

## 2017-09-17 ENCOUNTER — Telehealth: Payer: Self-pay

## 2017-09-17 ENCOUNTER — Ambulatory Visit (HOSPITAL_BASED_OUTPATIENT_CLINIC_OR_DEPARTMENT_OTHER): Payer: 59

## 2017-09-17 VITALS — BP 109/62 | HR 90 | Temp 97.8°F | Resp 18 | Ht 63.0 in | Wt 171.7 lb

## 2017-09-17 DIAGNOSIS — C7961 Secondary malignant neoplasm of right ovary: Secondary | ICD-10-CM | POA: Diagnosis not present

## 2017-09-17 DIAGNOSIS — C5702 Malignant neoplasm of left fallopian tube: Secondary | ICD-10-CM

## 2017-09-17 DIAGNOSIS — C7982 Secondary malignant neoplasm of genital organs: Secondary | ICD-10-CM | POA: Diagnosis not present

## 2017-09-17 DIAGNOSIS — F329 Major depressive disorder, single episode, unspecified: Secondary | ICD-10-CM

## 2017-09-17 DIAGNOSIS — Z5111 Encounter for antineoplastic chemotherapy: Secondary | ICD-10-CM

## 2017-09-17 DIAGNOSIS — M898X9 Other specified disorders of bone, unspecified site: Secondary | ICD-10-CM | POA: Diagnosis not present

## 2017-09-17 DIAGNOSIS — C7962 Secondary malignant neoplasm of left ovary: Secondary | ICD-10-CM | POA: Diagnosis not present

## 2017-09-17 DIAGNOSIS — C786 Secondary malignant neoplasm of retroperitoneum and peritoneum: Secondary | ICD-10-CM

## 2017-09-17 DIAGNOSIS — R200 Anesthesia of skin: Secondary | ICD-10-CM | POA: Diagnosis not present

## 2017-09-17 DIAGNOSIS — C569 Malignant neoplasm of unspecified ovary: Secondary | ICD-10-CM

## 2017-09-17 LAB — COMPREHENSIVE METABOLIC PANEL
ALT: 17 U/L (ref 0–55)
ANION GAP: 11 meq/L (ref 3–11)
AST: 18 U/L (ref 5–34)
Albumin: 3.7 g/dL (ref 3.5–5.0)
Alkaline Phosphatase: 79 U/L (ref 40–150)
BUN: 13.7 mg/dL (ref 7.0–26.0)
CHLORIDE: 106 meq/L (ref 98–109)
CO2: 24 mEq/L (ref 22–29)
CREATININE: 0.8 mg/dL (ref 0.6–1.1)
Calcium: 9.3 mg/dL (ref 8.4–10.4)
EGFR: >60 mL/min/{1.73_m2} (ref >60)
Glucose: 83 mg/dl (ref 70–140)
POTASSIUM: 3.3 meq/L — AB (ref 3.5–5.1)
Sodium: 141 mEq/L (ref 136–145)
Total Bilirubin: 0.38 mg/dL (ref 0.20–1.20)
Total Protein: 7.3 g/dL (ref 6.4–8.3)

## 2017-09-17 LAB — CBC WITH DIFFERENTIAL/PLATELET
BASO%: 0.4 % (ref 0.0–2.0)
BASOS ABS: 0 10*3/uL (ref 0.0–0.1)
EOS ABS: 0 10*3/uL (ref 0.0–0.5)
EOS%: 0.5 % (ref 0.0–7.0)
HEMATOCRIT: 34.2 % — AB (ref 34.8–46.6)
HGB: 11.2 g/dL — ABNORMAL LOW (ref 11.6–15.9)
LYMPH%: 41.2 % (ref 14.0–49.7)
MCH: 31 pg (ref 25.1–34.0)
MCHC: 32.7 g/dL (ref 31.5–36.0)
MCV: 94.7 fL (ref 79.5–101.0)
MONO#: 0.6 10*3/uL (ref 0.1–0.9)
MONO%: 10.2 % (ref 0.0–14.0)
NEUT#: 2.7 10*3/uL (ref 1.5–6.5)
NEUT%: 47.7 % (ref 38.4–76.8)
PLATELETS: 219 10*3/uL (ref 145–400)
RBC: 3.61 10*6/uL — ABNORMAL LOW (ref 3.70–5.45)
RDW: 16.6 % — ABNORMAL HIGH (ref 11.2–14.5)
WBC: 5.7 10*3/uL (ref 3.9–10.3)
lymph#: 2.4 10*3/uL (ref 0.9–3.3)
nRBC: 0 % (ref 0–0)

## 2017-09-17 MED ORDER — FAMOTIDINE IN NACL 20-0.9 MG/50ML-% IV SOLN
INTRAVENOUS | Status: AC
  Filled 2017-09-17: qty 50

## 2017-09-17 MED ORDER — DIPHENHYDRAMINE HCL 50 MG/ML IJ SOLN
25.0000 mg | Freq: Once | INTRAMUSCULAR | Status: AC
  Administered 2017-09-17: 25 mg via INTRAVENOUS

## 2017-09-17 MED ORDER — PALONOSETRON HCL INJECTION 0.25 MG/5ML
INTRAVENOUS | Status: AC
  Filled 2017-09-17: qty 5

## 2017-09-17 MED ORDER — PALONOSETRON HCL INJECTION 0.25 MG/5ML
0.2500 mg | Freq: Once | INTRAVENOUS | Status: AC
  Administered 2017-09-17: 0.25 mg via INTRAVENOUS

## 2017-09-17 MED ORDER — SODIUM CHLORIDE 0.9 % IV SOLN
20.0000 mg | Freq: Once | INTRAVENOUS | Status: AC
  Administered 2017-09-17: 20 mg via INTRAVENOUS
  Filled 2017-09-17: qty 2

## 2017-09-17 MED ORDER — DIPHENHYDRAMINE HCL 50 MG/ML IJ SOLN
INTRAMUSCULAR | Status: AC
  Filled 2017-09-17: qty 1

## 2017-09-17 MED ORDER — SODIUM CHLORIDE 0.9 % IV SOLN
140.0000 mg/m2 | Freq: Once | INTRAVENOUS | Status: AC
  Administered 2017-09-17: 258 mg via INTRAVENOUS
  Filled 2017-09-17: qty 43

## 2017-09-17 MED ORDER — CARBOPLATIN CHEMO INJECTION 600 MG/60ML
578.5000 mg | Freq: Once | INTRAVENOUS | Status: AC
  Administered 2017-09-17: 580 mg via INTRAVENOUS
  Filled 2017-09-17: qty 58

## 2017-09-17 MED ORDER — FAMOTIDINE IN NACL 20-0.9 MG/50ML-% IV SOLN
20.0000 mg | Freq: Once | INTRAVENOUS | Status: AC
  Administered 2017-09-17: 20 mg via INTRAVENOUS

## 2017-09-17 MED ORDER — SODIUM CHLORIDE 0.9 % IV SOLN
Freq: Once | INTRAVENOUS | Status: AC
  Administered 2017-09-17: 10:00:00 via INTRAVENOUS

## 2017-09-17 NOTE — Patient Instructions (Signed)
[facility] Discharge Instructions for Patients Receiving Chemotherapy  Today you received the following chemotherapy agents:  Carboplatin, Taxol  To help prevent nausea and vomiting after your treatment, we encourage you to take your nausea medication as prescribed.   If you develop nausea and vomiting that is not controlled by your nausea medication, call the clinic.   BELOW ARE SYMPTOMS THAT SHOULD BE REPORTED IMMEDIATELY:  *FEVER GREATER THAN 100.5 F  *CHILLS WITH OR WITHOUT FEVER  NAUSEA AND VOMITING THAT IS NOT CONTROLLED WITH YOUR NAUSEA MEDICATION  *UNUSUAL SHORTNESS OF BREATH  *UNUSUAL BRUISING OR BLEEDING  TENDERNESS IN MOUTH AND THROAT WITH OR WITHOUT PRESENCE OF ULCERS  *URINARY PROBLEMS  *BOWEL PROBLEMS  UNUSUAL RASH Items with * indicate a potential emergency and should be followed up as soon as possible.  Feel free to call the clinic should you have any questions or concerns. The clinic phone number is [phone].  Please show the CHEMO ALERT CARD at check-in to the Emergency Department and triage nurse.   

## 2017-09-17 NOTE — Telephone Encounter (Signed)
Printed avs and calender for upcoming appointment. Per 10/31 los

## 2017-09-17 NOTE — Progress Notes (Signed)
  [facility] OFFICE PROGRESS NOTE   Diagnosis: Ovarian cancer  INTERVAL HISTORY:   Anna [name] completed another cycle of Taxol/carboplatin on 08/26/2017. She reports bone pain lasting for 7 days following chemotherapy. The pain was relieved with tramadol and ibuprofen. No nausea. She reports mild intermittent numbness in the fingers. She has a burning discomfort intermittently at the right abdomen. She reports moderate difficulty with obtaining IV access for chemotherapy.  Objective:  Vital signs in last 24 hours:  Blood pressure 109/62, pulse 90, temperature 97.8 F (36.6 C), temperature source Oral, resp. rate 18, height 5\' 3"  (1.6 m), weight 171 lb 11.2 oz (77.9 kg).    HEENT: No thrush or ulcers Resp: Lungs clear bilaterally Cardio: Regular rate and rhythm GI: No hepatosplenomegaly, no mass, nontender Vascular: No leg edema    Lab Results:  Lab Results  Component Value Date   WBC 5.7 09/17/2017   HGB 11.2 (L) 09/17/2017   HCT 34.2 (L) 09/17/2017   MCV 94.7 09/17/2017   PLT 219 09/17/2017   NEUTROABS 2.7 09/17/2017    CMP     Component Value Date/Time   NA 139 08/26/2017 0836   K 3.8 08/26/2017 0836   CL 104 07/18/2017 0505   CO2 21 (L) 08/26/2017 0836   GLUCOSE 123 08/26/2017 0836   BUN 14.6 08/26/2017 0836   CREATININE 0.9 08/26/2017 0836   CALCIUM 9.5 08/26/2017 0836   PROT 7.4 08/26/2017 0836   ALBUMIN 3.7 08/26/2017 0836   AST 14 08/26/2017 0836   ALT 19 08/26/2017 0836   ALKPHOS 82 08/26/2017 0836   BILITOT 0.27 08/26/2017 0836   GFRNONAA >60 07/18/2017 0505   GFRAA >60 07/18/2017 0505   08/26/2017: CA 125-33  Medications:have reviewed the patient's current medications.  Assessment/Plan: 1. Left abdomen/pelvic pain ? CT abdomen/pelvis 06/26/2017-soft tissue implants in the lower anterior peritoneum with implants at the paracolic gutters and left pelvic sidewall ? Elevated CA 125 ? CT biopsy of anterior omental mass  07/04/2017-Metastatic carcinoma consistent with a gynecologic primary ? 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 Cycle 1 adjuvant Taxol/carboplatin 08/05/2017 ? Cycle 2 adjuvant Taxol/carboplatin 08/26/2017 ? Cycle 3 adjuvant Taxol/carboplatin 09/17/2017-Taxol dose reduced secondary to neuropathy and bone pain   2. Depression  3. Hypothyroid  4. Family history of uterine cancer-paternal grandmother     Disposition: Ms. Holts has completed 2 cycles of Taxol/carboplatin. She has significant bone pain following Taxol. She is developing early neuropathy symptoms. We decided to dose reduce the Taxol beginning with cycle 3 today.  She will return for an office visit and the next cycle of chemotherapy on 10/10/2017.  We will consider referral for a PICC or port if there is difficulty with IV access today.     Donneta Romberg, MD    09/17/2017  9:40 AM

## 2017-09-18 LAB — CA 125: Cancer Antigen (CA) 125: 11.8 U/mL (ref 0.0–38.1)

## 2017-09-24 ENCOUNTER — Encounter (HOSPITAL_COMMUNITY): Payer: Self-pay

## 2017-09-29 ENCOUNTER — Telehealth: Payer: Self-pay | Admitting: Genetics

## 2017-10-08 NOTE — Telephone Encounter (Signed)
Revealed negative genetic testing.   This normal result is reassuring and indicates that it is unlikely Anna [name]'s cancer is due to a hereditary cause.  It is unlikely that there is an increased risk of another cancer due to a mutation in one of these genes.  However, genetic testing is not perfect, and cannot definitively rule out a hereditary cause.  It will be important for her to keep in contact with genetics to learn if any additional testing may be needed in the future.     We are still waiting on the tumor HRD testing results that could provide more information about the characteristics of her tumor for possible treatment guidance.  We will call her when those results are available.

## 2017-10-10 ENCOUNTER — Telehealth: Payer: Self-pay | Admitting: Oncology

## 2017-10-10 ENCOUNTER — Ambulatory Visit (HOSPITAL_BASED_OUTPATIENT_CLINIC_OR_DEPARTMENT_OTHER): Payer: 59

## 2017-10-10 ENCOUNTER — Ambulatory Visit (HOSPITAL_BASED_OUTPATIENT_CLINIC_OR_DEPARTMENT_OTHER): Payer: 59 | Admitting: Oncology

## 2017-10-10 ENCOUNTER — Other Ambulatory Visit (HOSPITAL_BASED_OUTPATIENT_CLINIC_OR_DEPARTMENT_OTHER): Payer: 59

## 2017-10-10 VITALS — BP 120/69 | HR 94 | Temp 97.6°F | Resp 18 | Ht 63.0 in | Wt 171.3 lb

## 2017-10-10 DIAGNOSIS — C7962 Secondary malignant neoplasm of left ovary: Secondary | ICD-10-CM

## 2017-10-10 DIAGNOSIS — C7982 Secondary malignant neoplasm of genital organs: Secondary | ICD-10-CM

## 2017-10-10 DIAGNOSIS — C786 Secondary malignant neoplasm of retroperitoneum and peritoneum: Secondary | ICD-10-CM

## 2017-10-10 DIAGNOSIS — C7961 Secondary malignant neoplasm of right ovary: Secondary | ICD-10-CM

## 2017-10-10 DIAGNOSIS — R200 Anesthesia of skin: Secondary | ICD-10-CM | POA: Diagnosis not present

## 2017-10-10 DIAGNOSIS — C5702 Malignant neoplasm of left fallopian tube: Secondary | ICD-10-CM

## 2017-10-10 DIAGNOSIS — C569 Malignant neoplasm of unspecified ovary: Secondary | ICD-10-CM

## 2017-10-10 DIAGNOSIS — Z5111 Encounter for antineoplastic chemotherapy: Secondary | ICD-10-CM | POA: Diagnosis not present

## 2017-10-10 DIAGNOSIS — F329 Major depressive disorder, single episode, unspecified: Secondary | ICD-10-CM

## 2017-10-10 LAB — CBC WITH DIFFERENTIAL/PLATELET
BASO%: 0.7 % (ref 0.0–2.0)
Basophils Absolute: 0 10*3/uL (ref 0.0–0.1)
EOS%: 0.5 % (ref 0.0–7.0)
Eosinophils Absolute: 0 10*3/uL (ref 0.0–0.5)
HEMATOCRIT: 31.7 % — AB (ref 34.8–46.6)
HGB: 10.7 g/dL — ABNORMAL LOW (ref 11.6–15.9)
LYMPH#: 1.7 10*3/uL (ref 0.9–3.3)
LYMPH%: 39.6 % (ref 14.0–49.7)
MCH: 31.9 pg (ref 25.1–34.0)
MCHC: 33.7 g/dL (ref 31.5–36.0)
MCV: 94.9 fL (ref 79.5–101.0)
MONO#: 0.4 10*3/uL (ref 0.1–0.9)
MONO%: 9.2 % (ref 0.0–14.0)
NEUT%: 50 % (ref 38.4–76.8)
NEUTROS ABS: 2.2 10*3/uL (ref 1.5–6.5)
Platelets: 190 10*3/uL (ref 145–400)
RBC: 3.35 10*6/uL — AB (ref 3.70–5.45)
RDW: 18.9 % — ABNORMAL HIGH (ref 11.2–14.5)
WBC: 4.4 10*3/uL (ref 3.9–10.3)

## 2017-10-10 LAB — COMPREHENSIVE METABOLIC PANEL
ALBUMIN: 3.6 g/dL (ref 3.5–5.0)
ALK PHOS: 77 U/L (ref 40–150)
ALT: 25 U/L (ref 0–55)
AST: 28 U/L (ref 5–34)
Anion Gap: 12 mEq/L — ABNORMAL HIGH (ref 3–11)
BILIRUBIN TOTAL: 0.37 mg/dL (ref 0.20–1.20)
BUN: 14 mg/dL (ref 7.0–26.0)
CALCIUM: 9.2 mg/dL (ref 8.4–10.4)
CO2: 23 mEq/L (ref 22–29)
Chloride: 104 mEq/L (ref 98–109)
Creatinine: 0.8 mg/dL (ref 0.6–1.1)
GLUCOSE: 88 mg/dL (ref 70–140)
POTASSIUM: 3.4 meq/L — AB (ref 3.5–5.1)
SODIUM: 139 meq/L (ref 136–145)
TOTAL PROTEIN: 7.5 g/dL (ref 6.4–8.3)

## 2017-10-10 MED ORDER — SODIUM CHLORIDE 0.9 % IV SOLN
20.0000 mg | Freq: Once | INTRAVENOUS | Status: AC
  Administered 2017-10-10: 20 mg via INTRAVENOUS
  Filled 2017-10-10: qty 2

## 2017-10-10 MED ORDER — DIPHENHYDRAMINE HCL 50 MG/ML IJ SOLN
25.0000 mg | Freq: Once | INTRAMUSCULAR | Status: AC
  Administered 2017-10-10: 25 mg via INTRAVENOUS

## 2017-10-10 MED ORDER — PALONOSETRON HCL INJECTION 0.25 MG/5ML
INTRAVENOUS | Status: AC
  Filled 2017-10-10: qty 5

## 2017-10-10 MED ORDER — PACLITAXEL CHEMO INJECTION 300 MG/50ML
140.0000 mg/m2 | Freq: Once | INTRAVENOUS | Status: AC
  Administered 2017-10-10: 258 mg via INTRAVENOUS
  Filled 2017-10-10: qty 43

## 2017-10-10 MED ORDER — PACLITAXEL CHEMO INJECTION 300 MG/50ML: 140.0000 mg/m2 | Freq: Once | INTRAVENOUS | Status: DC

## 2017-10-10 MED ORDER — DIPHENHYDRAMINE HCL 50 MG/ML IJ SOLN
INTRAMUSCULAR | Status: AC
  Filled 2017-10-10: qty 1

## 2017-10-10 MED ORDER — SODIUM CHLORIDE 0.9 % IV SOLN
Freq: Once | INTRAVENOUS | Status: AC
  Administered 2017-10-10: 10:00:00 via INTRAVENOUS

## 2017-10-10 MED ORDER — PALONOSETRON HCL INJECTION 0.25 MG/5ML
0.2500 mg | Freq: Once | INTRAVENOUS | Status: AC
  Administered 2017-10-10: 0.25 mg via INTRAVENOUS

## 2017-10-10 MED ORDER — CARBOPLATIN CHEMO INJECTION 600 MG/60ML
578.5000 mg | Freq: Once | INTRAVENOUS | Status: AC
  Administered 2017-10-10: 580 mg via INTRAVENOUS
  Filled 2017-10-10: qty 58

## 2017-10-10 MED ORDER — FAMOTIDINE IN NACL 20-0.9 MG/50ML-% IV SOLN
20.0000 mg | Freq: Once | INTRAVENOUS | Status: AC
  Administered 2017-10-10: 20 mg via INTRAVENOUS

## 2017-10-10 MED ORDER — FAMOTIDINE IN NACL 20-0.9 MG/50ML-% IV SOLN
INTRAVENOUS | Status: AC
  Filled 2017-10-10: qty 50

## 2017-10-10 MED ORDER — DEXAMETHASONE SODIUM PHOSPHATE 10 MG/ML IJ SOLN
INTRAMUSCULAR | Status: AC
  Filled 2017-10-10: qty 1

## 2017-10-10 NOTE — Patient Instructions (Signed)
[facility] Discharge Instructions for Patients Receiving Chemotherapy  Today you received the following chemotherapy agents:  Carboplatin, Taxol  To help prevent nausea and vomiting after your treatment, we encourage you to take your nausea medication as prescribed.   If you develop nausea and vomiting that is not controlled by your nausea medication, call the clinic.   BELOW ARE SYMPTOMS THAT SHOULD BE REPORTED IMMEDIATELY:  *FEVER GREATER THAN 100.5 F  *CHILLS WITH OR WITHOUT FEVER  NAUSEA AND VOMITING THAT IS NOT CONTROLLED WITH YOUR NAUSEA MEDICATION  *UNUSUAL SHORTNESS OF BREATH  *UNUSUAL BRUISING OR BLEEDING  TENDERNESS IN MOUTH AND THROAT WITH OR WITHOUT PRESENCE OF ULCERS  *URINARY PROBLEMS  *BOWEL PROBLEMS  UNUSUAL RASH Items with * indicate a potential emergency and should be followed up as soon as possible.  Feel free to call the clinic should you have any questions or concerns. The clinic phone number is [phone].  Please show the CHEMO ALERT CARD at check-in to the Emergency Department and triage nurse.   

## 2017-10-10 NOTE — Telephone Encounter (Signed)
Gave avs and calendar for December  °

## 2017-10-10 NOTE — Progress Notes (Signed)
  [facility] OFFICE PROGRESS NOTE   Diagnosis: Fallopian tube carcinoma  INTERVAL HISTORY:   Anna [name] returns as scheduled.  She completed another cycle of Taxol/carboplatin on 09/17/2017.  No nausea/vomiting.  She reports less bone pain following the cycle of chemotherapy.  She has intermittent numbness in the fingers.  This does not interfere with activity.  Objective:  Vital signs in last 24 hours:  Blood pressure 120/69, pulse 94, temperature 97.6 F (36.4 C), temperature source Oral, resp. rate 18, height 5\' 3"  (1.6 m), weight 171 lb 4.8 oz (77.7 kg), SpO2 100 %.    HEENT: No thrush or ulcers Resp: Lungs clear bilaterally Cardio: Regular rate and rhythm GI: No hepatosplenomegaly, no apparent ascites, nontender Vascular: Leg edema  Lab Results:  Lab Results  Component Value Date   WBC 4.4 10/10/2017   HGB 10.7 (L) 10/10/2017   HCT 31.7 (L) 10/10/2017   MCV 94.9 10/10/2017   PLT 190 10/10/2017   NEUTROABS 2.2 10/10/2017    CMP     Component Value Date/Time   NA 141 09/17/2017 0855   K 3.3 (L) 09/17/2017 0855   CL 104 07/18/2017 0505   CO2 24 09/17/2017 0855   GLUCOSE 83 09/17/2017 0855   BUN 13.7 09/17/2017 0855   CREATININE 0.8 09/17/2017 0855   CALCIUM 9.3 09/17/2017 0855   PROT 7.3 09/17/2017 0855   ALBUMIN 3.7 09/17/2017 0855   AST 18 09/17/2017 0855   ALT 17 09/17/2017 0855   ALKPHOS 79 09/17/2017 0855   BILITOT 0.38 09/17/2017 0855   GFRNONAA >60 07/18/2017 0505   GFRAA >60 07/18/2017 0505   CA 125 on 1031 2018-11 0.8  Medications: I have reviewed the patient's current medications.  Assessment/Plan: 1. Left abdomen/pelvic pain ? CT abdomen/pelvis 06/26/2017-soft tissue implants in the lower anterior peritoneum with implants at the paracolic gutters and left pelvic sidewall ? Elevated CA 125 ? CT biopsy of anterior omental mass 07/04/2017-Metastatic carcinoma consistent with a gynecologic primary ? Exploratory laparotomy,  total hysterectomy, bilateral salpingo-oophorectomy, and omentectomy 07/17/2017, pathology revealed a high-grade serous carcinoma of the left fallopian tube with metastatic disease to the omentum, right fallopian tube, and bilateral ovaries,pT3,pNx, optimal debulking with small peritoneal studding of the diaphragm and a remaining thin rind of tumor at the posterior peritoneum in the pelvis ? Cycle 1 adjuvant Taxol/carboplatin 08/05/2017 ? Cycle 2 adjuvant Taxol/carboplatin 08/26/2017 ? Cycle 3 adjuvant Taxol/carboplatin 09/17/2017-Taxol dose reduced secondary to neuropathy and bone pain  ? Cycle 4 adjuvant Taxol/carboplatin 10/10/2017  2. Depression  3. Hypothyroid  4. Family history of uterine cancer-paternal grandmother    Disposition:  Anna [name] appears stable.  The plan is to proceed with another cycle of Taxol/carboplatin today.  She will return for an office visit and chemotherapy in 3 weeks.  15 minutes were spent with the patient today.  The majority of the time was used for counseling and coordination of care.  [name], MD  10/10/2017  8:49 AM

## 2017-10-11 LAB — CA 125: Cancer Antigen (CA) 125: 8.8 U/mL (ref 0.0–38.1)

## 2017-10-24 ENCOUNTER — Encounter: Payer: Self-pay | Admitting: Internal Medicine

## 2017-10-24 ENCOUNTER — Ambulatory Visit (INDEPENDENT_AMBULATORY_CARE_PROVIDER_SITE_OTHER): Payer: 59 | Admitting: Internal Medicine

## 2017-10-24 VITALS — BP 112/77 | HR 96 | Temp 98.2°F | Resp 16 | Ht 63.0 in | Wt 174.4 lb

## 2017-10-24 DIAGNOSIS — Z0000 Encounter for general adult medical examination without abnormal findings: Secondary | ICD-10-CM | POA: Diagnosis not present

## 2017-10-24 DIAGNOSIS — C800 Disseminated malignant neoplasm, unspecified: Secondary | ICD-10-CM

## 2017-10-24 DIAGNOSIS — K219 Gastro-esophageal reflux disease without esophagitis: Secondary | ICD-10-CM

## 2017-10-24 DIAGNOSIS — E7800 Pure hypercholesterolemia, unspecified: Secondary | ICD-10-CM

## 2017-10-24 DIAGNOSIS — C569 Malignant neoplasm of unspecified ovary: Secondary | ICD-10-CM | POA: Diagnosis not present

## 2017-10-24 DIAGNOSIS — Z1231 Encounter for screening mammogram for malignant neoplasm of breast: Secondary | ICD-10-CM

## 2017-10-24 DIAGNOSIS — C5702 Malignant neoplasm of left fallopian tube: Secondary | ICD-10-CM | POA: Diagnosis not present

## 2017-10-24 DIAGNOSIS — F329 Major depressive disorder, single episode, unspecified: Secondary | ICD-10-CM

## 2017-10-24 DIAGNOSIS — E039 Hypothyroidism, unspecified: Secondary | ICD-10-CM

## 2017-10-24 DIAGNOSIS — I10 Essential (primary) hypertension: Secondary | ICD-10-CM

## 2017-10-24 DIAGNOSIS — Z1239 Encounter for other screening for malignant neoplasm of breast: Secondary | ICD-10-CM

## 2017-10-24 DIAGNOSIS — F32A Depression, unspecified: Secondary | ICD-10-CM

## 2017-10-24 NOTE — Progress Notes (Signed)
Patient ID: Anna [name], female   DOB: [birth year], 61 y.o.   MRN: [id number]   Subjective:    Patient ID: Anna [name], female    DOB: [birth year], 61 y.o.   MRN: [id number]  HPI  Patient here for her physical exam.  She was recently diagnosed with fallopian tube carcinoma.  Is s/p abdominal hysterectomy with BSO.  Seeing Dr Benay Spice.  Receiving chemotherapy.  Last infusion 10/10/17.  Doing well.  Some fatigue.  Overall doing well.  Breathing stable.  Minimal allergy issues.  No increased congestion or cough.  No sob.  Some muscle aches and joint aches, but overall she feels she is doing relatively well.  Has good support.  Due mammogram.     Past Medical History:  Diagnosis Date  . Anxiety   . Arthritis   . Cancer [facility])    gyenicological 2018  . Depression   . Diverticulosis   . Family history of adverse reaction to anesthesia    Mother has extreme [place] with anesthesia  . Family history of breast cancer   . Frequent headaches    H/O  . GERD (gastroesophageal reflux disease)   . Heart murmur    Hx of  . History of chicken pox   . Hypercholesterolemia   . Hypertension   . Hypothyroidism   . Pneumonia   . Sleep apnea    No longer wears cpap   Past Surgical History:  Procedure Laterality Date  . ABDOMINAL HYSTERECTOMY     Dr. Denman George 07/17/17  . APPENDECTOMY  1981  . CARPAL TUNNEL RELEASE  2000  . [place]  . EYE SURGERY Bilateral 2008   Lasix eye srg  . LAPAROSCOPY N/A 07/17/2017   Procedure: LAPAROSCOPY DIAGNOSTIC;  Surgeon: Everitt Amber, MD;  Location: [facility];  Service: Gynecology;  Laterality: N/A;  . LAPAROTOMY N/A 07/17/2017   Procedure: EXPLORATORY LAPAROTOMY;  Surgeon: Everitt Amber, MD;  Location: [facility];  Service: Gynecology;  Laterality: N/A;  . OMENTECTOMY N/A 07/17/2017   Procedure: OMENTECTOMY WITH RADICAL TUMOR DEBULKING;  Surgeon: Everitt Amber, MD;  Location: [facility];  Service: Gynecology;  Laterality: N/A;  . TUBAL LIGATION   1985   Family History  Problem Relation Age of Onset  . Hyperlipidemia Mother   . Hypertension Mother   . Alzheimer's disease Mother   . Arthritis Father   . Hyperlipidemia Father   . Heart disease Father        first MI age 46  . Hypertension Father   . Hyperlipidemia Brother   . Heart disease Brother        heart disease dx at a youg age  . Hypertension Brother   . Alzheimer's disease Maternal Aunt   . Hyperlipidemia Maternal Uncle   . Heart disease Maternal Uncle   . Sudden death Maternal Uncle 42       massive heart attack in doctors office  . Arthritis Maternal Grandmother   . Hyperlipidemia Maternal Grandmother   . Stroke Maternal Grandmother   . Hypertension Maternal Grandmother   . Alzheimer's disease Maternal Grandmother   . Alcohol abuse Maternal Grandfather   . Hypertension Maternal Grandfather   . Diabetes Maternal Grandfather   . Cancer Paternal Grandmother        Ovarian  . Hypertension Paternal Grandmother   . Alcohol abuse Paternal Grandfather   . Hyperlipidemia Paternal Grandfather   . Heart disease Paternal Grandfather   . Hypertension Paternal Grandfather   .  Breast cancer Cousin        pat cousin   Social History   Socioeconomic History  . Marital status: Married    Spouse name: None  . Number of children: 2  . Years of education: None  . Highest education level: None  Social Needs  . Financial resource strain: None  . Food insecurity - worry: None  . Food insecurity - inability: None  . Transportation needs - medical: None  . Transportation needs - non-medical: None  Occupational History  . None  Tobacco Use  . Smoking status: Never Smoker  . Smokeless tobacco: Never Used  Substance and Sexual Activity  . Alcohol use: No    Alcohol/week: 0.0 oz  . Drug use: No  . Sexual activity: Yes  Other Topics Concern  . None  Social History Narrative  . None    Outpatient Encounter Medications as of 10/24/2017  Medication Sig  . acyclovir  cream (ZOVIRAX) 5 % Use as directed.  . benazepril-hydrochlorthiazide (LOTENSIN HCT) 20-12.5 MG tablet Take 1 tablet by mouth daily.  Marland Kitchen buPROPion (WELLBUTRIN XL) 300 MG 24 hr tablet TAKE 1 TABLET DAILY  . Calcium Carb-Cholecalciferol (CALCIUM 1000 + D PO) Take 1 tablet by mouth daily.  Marland Kitchen EPINEPHrine 0.3 mg/0.3 mL IJ SOAJ injection Inject 0.3 mLs (0.3 mg total) into the muscle as needed (for anaphylaxis).  Marland Kitchen ibuprofen (ADVIL,MOTRIN) 200 MG tablet Take 200 mg by mouth every 6 (six) hours as needed.  Marland Kitchen levothyroxine (SYNTHROID, LEVOTHROID) 88 MCG tablet TAKE 1 TABLET DAILY BEFORE BREAKFAST  . Multiple Vitamin (MULTIVITAMIN WITH MINERALS) TABS tablet Take 1 tablet by mouth daily.  Marland Kitchen omeprazole (PRILOSEC) 20 MG capsule TAKE 1 CAPSULE TWICE A DAY BEFORE MEALS  . oxyCODONE-acetaminophen (PERCOCET/ROXICET) 5-325 MG tablet Take 1-2 tablets by mouth every 4 (four) hours as needed for severe pain.  Marland Kitchen prochlorperazine (COMPAZINE) 10 MG tablet Take 1 tablet (10 mg total) by mouth every 6 (six) hours as needed for nausea or vomiting.  . rosuvastatin (CRESTOR) 5 MG tablet TAKE 1 TABLET DAILY  . traMADol (ULTRAM) 50 MG tablet Take 1 tablet (50 mg total) by mouth every 6 (six) hours as needed.  . [DISCONTINUED] acyclovir (ZOVIRAX) 400 MG tablet Take one tablet tid for five days prn flares.   No facility-administered encounter medications on file as of 10/24/2017.     Review of Systems  Constitutional: Positive for fatigue. Negative for appetite change and unexpected weight change.  HENT: Negative for congestion and sinus pressure.   Eyes: Negative for pain and visual disturbance.  Respiratory: Negative for cough, chest tightness and shortness of breath.   Cardiovascular: Negative for chest pain, palpitations and leg swelling.  Gastrointestinal: Negative for abdominal pain, diarrhea, nausea and vomiting.  Genitourinary: Negative for difficulty urinating and dysuria.  Musculoskeletal: Negative for joint  swelling.       Muscle and joint aches as outlined.    Skin: Negative for color change and rash.  Neurological: Negative for dizziness, light-headedness and headaches.  Hematological: Negative for adenopathy. Does not bruise/bleed easily.  Psychiatric/Behavioral: Negative for agitation and dysphoric mood.       Objective:     Blood pressure rechecked by me:  120/78  Physical Exam  Constitutional: She is oriented to person, place, and time. She appears well-developed and well-nourished. No distress.  HENT:  Nose: Nose normal.  Mouth/Throat: Oropharynx is clear and moist.  Eyes: Right eye exhibits no discharge. Left eye exhibits no discharge. No scleral icterus.  Neck: Neck supple. No thyromegaly present.  Cardiovascular: Normal rate and regular rhythm.  Pulmonary/Chest: Breath sounds normal. No accessory muscle usage. No tachypnea. No respiratory distress. She has no decreased breath sounds. She has no wheezes. She has no rhonchi. Right breast exhibits no inverted nipple, no mass, no nipple discharge and no tenderness (no axillary adenopathy). Left breast exhibits no inverted nipple, no mass, no nipple discharge and no tenderness (no axilarry adenopathy).  Abdominal: Soft. Bowel sounds are normal. There is no tenderness.  Musculoskeletal: She exhibits no edema or tenderness.  Lymphadenopathy:    She has no cervical adenopathy.  Neurological: She is alert and oriented to person, place, and time.  Skin: Skin is warm. No rash noted. No erythema.  Psychiatric: She has a normal mood and affect. Her behavior is normal.    BP 112/77   Pulse 96   Temp 98.2 F (36.8 C)   Resp 16   Ht 5\' 3"  (1.6 m)   Wt 174 lb 6 oz (79.1 kg)   SpO2 98%   BMI 30.89 kg/m  Wt Readings from Last 3 Encounters:  10/24/17 174 lb 6 oz (79.1 kg)  10/10/17 171 lb 4.8 oz (77.7 kg)  09/17/17 171 lb 11.2 oz (77.9 kg)     Lab Results  Component Value Date   WBC 4.4 10/10/2017   HGB 10.7 (L) 10/10/2017    HCT 31.7 (L) 10/10/2017   PLT 190 10/10/2017   GLUCOSE 88 10/10/2017   CHOL 144 06/23/2017   TRIG 130 06/23/2017   HDL 50 (L) 06/23/2017   LDLDIRECT 143.0 05/23/2016   LDLCALC 68 06/23/2017   ALT 25 10/10/2017   AST 28 10/10/2017   NA 139 10/10/2017   K 3.4 (L) 10/10/2017   CL 104 07/18/2017   CREATININE 0.8 10/10/2017   BUN 14.0 10/10/2017   CO2 23 10/10/2017   TSH 1.74 06/23/2017   INR 0.93 07/04/2017       Assessment & Plan:   Problem List Items Addressed This Visit    Carcinomatosis ([place])    Seeing Dr Benay Spice.  Undergoing treatments.  Doing well.        Depression    On wellbutrin.  Increased stress recently with her diagnosis.  Discussed with her today.  Has good support.  Does not feel needs anything more at this time.  Follow.        Essential hypertension, benign    Blood pressure under good control.  Continue same medication regimen.  Follow pressures.  Follow metabolic panel.        GERD (gastroesophageal reflux disease)    Controlled on omeprazole.        Health care maintenance    Physical today 10/24/17.  PAP 05/04/15 - negative with negative HPV.  Mammogram overdue.  Schedule at [facility].        Hypercholesterolemia    On crestor.  Low cholesterol diet and exercise.  Follow lipid panel and liver function tests.        Hypothyroidism    On thyroid replacement.  Follow tsh.       Malignant neoplasm of fallopian tube ([place])    Followed by oncology.  Undergoing chemotherapy.        Ovarian cancer [facility])    Seeing Dr Benay Spice.  Undergoing chemotherapy.         Other Visit Diagnoses    Breast cancer screening    -  Primary   Relevant Orders   MM Digital Screening  Einar Pheasant, MD

## 2017-10-24 NOTE — Assessment & Plan Note (Addendum)
Physical today 10/24/17.  PAP 05/04/15 - negative with negative HPV.  Mammogram overdue.  Schedule at [facility].

## 2017-10-26 ENCOUNTER — Other Ambulatory Visit: Payer: Self-pay | Admitting: Oncology

## 2017-10-27 NOTE — Assessment & Plan Note (Signed)
Seeing Dr Benay Spice.  Undergoing chemotherapy.

## 2017-10-27 NOTE — Assessment & Plan Note (Signed)
Followed by oncology.  Undergoing chemotherapy.

## 2017-10-27 NOTE — Assessment & Plan Note (Signed)
On crestor.  Low cholesterol diet and exercise.  Follow lipid panel and liver function tests.   

## 2017-10-27 NOTE — Assessment & Plan Note (Signed)
On thyroid replacement.  Follow tsh.  

## 2017-10-27 NOTE — Assessment & Plan Note (Signed)
Blood pressure under good control.  Continue same medication regimen.  Follow pressures.  Follow metabolic panel.   

## 2017-10-27 NOTE — Assessment & Plan Note (Signed)
Controlled on omeprazole.   

## 2017-10-27 NOTE — Assessment & Plan Note (Signed)
Seeing Dr Benay Spice.  Undergoing treatments.  Doing well.

## 2017-10-27 NOTE — Assessment & Plan Note (Signed)
On wellbutrin.  Increased stress recently with her diagnosis.  Discussed with her today.  Has good support.  Does not feel needs anything more at this time.  Follow.

## 2017-10-31 ENCOUNTER — Ambulatory Visit (HOSPITAL_BASED_OUTPATIENT_CLINIC_OR_DEPARTMENT_OTHER): Payer: 59

## 2017-10-31 ENCOUNTER — Other Ambulatory Visit (HOSPITAL_BASED_OUTPATIENT_CLINIC_OR_DEPARTMENT_OTHER): Payer: 59

## 2017-10-31 ENCOUNTER — Ambulatory Visit (HOSPITAL_BASED_OUTPATIENT_CLINIC_OR_DEPARTMENT_OTHER): Payer: 59 | Admitting: Oncology

## 2017-10-31 ENCOUNTER — Encounter: Payer: Self-pay | Admitting: Genetics

## 2017-10-31 ENCOUNTER — Telehealth: Payer: Self-pay | Admitting: Oncology

## 2017-10-31 ENCOUNTER — Telehealth: Payer: Self-pay | Admitting: Genetics

## 2017-10-31 VITALS — BP 118/82 | HR 90 | Temp 97.8°F | Resp 18 | Ht 63.0 in | Wt 174.0 lb

## 2017-10-31 DIAGNOSIS — C5702 Malignant neoplasm of left fallopian tube: Secondary | ICD-10-CM

## 2017-10-31 DIAGNOSIS — C7961 Secondary malignant neoplasm of right ovary: Secondary | ICD-10-CM

## 2017-10-31 DIAGNOSIS — C786 Secondary malignant neoplasm of retroperitoneum and peritoneum: Secondary | ICD-10-CM | POA: Diagnosis not present

## 2017-10-31 DIAGNOSIS — C7982 Secondary malignant neoplasm of genital organs: Secondary | ICD-10-CM

## 2017-10-31 DIAGNOSIS — R200 Anesthesia of skin: Secondary | ICD-10-CM

## 2017-10-31 DIAGNOSIS — Z5111 Encounter for antineoplastic chemotherapy: Secondary | ICD-10-CM

## 2017-10-31 DIAGNOSIS — F329 Major depressive disorder, single episode, unspecified: Secondary | ICD-10-CM

## 2017-10-31 DIAGNOSIS — C7962 Secondary malignant neoplasm of left ovary: Secondary | ICD-10-CM

## 2017-10-31 DIAGNOSIS — C569 Malignant neoplasm of unspecified ovary: Secondary | ICD-10-CM

## 2017-10-31 LAB — CBC WITH DIFFERENTIAL/PLATELET
BASO%: 0.2 % (ref 0.0–2.0)
Basophils Absolute: 0 10*3/uL (ref 0.0–0.1)
EOS%: 0.3 % (ref 0.0–7.0)
Eosinophils Absolute: 0 10*3/uL (ref 0.0–0.5)
HCT: 32.4 % — ABNORMAL LOW (ref 34.8–46.6)
HGB: 10.5 g/dL — ABNORMAL LOW (ref 11.6–15.9)
LYMPH%: 41 % (ref 14.0–49.7)
MCH: 31.9 pg (ref 25.1–34.0)
MCHC: 32.4 g/dL (ref 31.5–36.0)
MCV: 98.5 fL (ref 79.5–101.0)
MONO#: 0.7 10*3/uL (ref 0.1–0.9)
MONO%: 11.4 % (ref 0.0–14.0)
NEUT#: 2.8 10*3/uL (ref 1.5–6.5)
NEUT%: 47.1 % (ref 38.4–76.8)
Platelets: 162 10*3/uL (ref 145–400)
RBC: 3.29 10*6/uL — ABNORMAL LOW (ref 3.70–5.45)
RDW: 18 % — ABNORMAL HIGH (ref 11.2–14.5)
WBC: 5.9 10*3/uL (ref 3.9–10.3)
lymph#: 2.4 10*3/uL (ref 0.9–3.3)

## 2017-10-31 LAB — COMPREHENSIVE METABOLIC PANEL
ALT: 15 U/L (ref 0–55)
AST: 16 U/L (ref 5–34)
Albumin: 3.7 g/dL (ref 3.5–5.0)
Alkaline Phosphatase: 77 U/L (ref 40–150)
Anion Gap: 12 mEq/L — ABNORMAL HIGH (ref 3–11)
BUN: 17.7 mg/dL (ref 7.0–26.0)
CO2: 21 mEq/L — ABNORMAL LOW (ref 22–29)
Calcium: 9.1 mg/dL (ref 8.4–10.4)
Chloride: 106 mEq/L (ref 98–109)
Creatinine: 0.8 mg/dL (ref 0.6–1.1)
EGFR: >60 mL/min/{1.73_m2} (ref >60)
Glucose: 92 mg/dl (ref 70–140)
Potassium: 4 mEq/L (ref 3.5–5.1)
Sodium: 139 mEq/L (ref 136–145)
Total Bilirubin: 0.28 mg/dL (ref 0.20–1.20)
Total Protein: 7.2 g/dL (ref 6.4–8.3)

## 2017-10-31 MED ORDER — DIPHENHYDRAMINE HCL 50 MG/ML IJ SOLN
25.0000 mg | Freq: Once | INTRAMUSCULAR | Status: AC
  Administered 2017-10-31: 25 mg via INTRAVENOUS

## 2017-10-31 MED ORDER — FAMOTIDINE IN NACL 20-0.9 MG/50ML-% IV SOLN
20.0000 mg | Freq: Once | INTRAVENOUS | Status: AC
  Administered 2017-10-31: 20 mg via INTRAVENOUS

## 2017-10-31 MED ORDER — SODIUM CHLORIDE 0.9 % IV SOLN
578.5000 mg | Freq: Once | INTRAVENOUS | Status: AC
  Administered 2017-10-31: 580 mg via INTRAVENOUS
  Filled 2017-10-31: qty 58

## 2017-10-31 MED ORDER — SODIUM CHLORIDE 0.9 % IV SOLN
140.0000 mg/m2 | Freq: Once | INTRAVENOUS | Status: AC
  Administered 2017-10-31: 258 mg via INTRAVENOUS
  Filled 2017-10-31: qty 43

## 2017-10-31 MED ORDER — SODIUM CHLORIDE 0.9 % IV SOLN
20.0000 mg | Freq: Once | INTRAVENOUS | Status: AC
  Administered 2017-10-31: 20 mg via INTRAVENOUS
  Filled 2017-10-31: qty 2

## 2017-10-31 MED ORDER — SODIUM CHLORIDE 0.9 % IV SOLN
Freq: Once | INTRAVENOUS | Status: AC
  Administered 2017-10-31: 10:00:00 via INTRAVENOUS

## 2017-10-31 MED ORDER — PALONOSETRON HCL INJECTION 0.25 MG/5ML
INTRAVENOUS | Status: AC
  Filled 2017-10-31: qty 5

## 2017-10-31 MED ORDER — DIPHENHYDRAMINE HCL 50 MG/ML IJ SOLN
INTRAMUSCULAR | Status: AC
  Filled 2017-10-31: qty 1

## 2017-10-31 MED ORDER — FAMOTIDINE IN NACL 20-0.9 MG/50ML-% IV SOLN
INTRAVENOUS | Status: AC
  Filled 2017-10-31: qty 50

## 2017-10-31 MED ORDER — PALONOSETRON HCL INJECTION 0.25 MG/5ML
0.2500 mg | Freq: Once | INTRAVENOUS | Status: AC
  Administered 2017-10-31: 0.25 mg via INTRAVENOUS

## 2017-10-31 NOTE — Progress Notes (Signed)
  Anna [name] OFFICE PROGRESS NOTE   Diagnosis: Fallopian tube carcinoma  INTERVAL HISTORY:   Anna [name] returns as scheduled.  She completed another cycle of Taxol/carboplatin 10/10/2017.  She tolerated the chemotherapy well.  Bone pain for a few days following chemotherapy is relieved with tramadol.  Mild intermittent peripheral numbness.  This does not interfere with activity.  Objective:  Vital signs in last 24 hours:  Blood pressure 118/82, pulse 90, temperature 97.8 F (36.6 C), temperature source Oral, resp. rate 18, height 5\' 3"  (1.6 m), weight 174 lb (78.9 kg), SpO2 98 %.    HEENT: No thrush or ulcers Resp: Lungs clear bilaterally Cardio: Regular rate and rhythm GI: No hepatomegaly, no apparent ascites, no mass Vascular: No leg edema   Lab Results:  Lab Results  Component Value Date   WBC 5.9 10/31/2017   HGB 10.5 (L) 10/31/2017   HCT 32.4 (L) 10/31/2017   MCV 98.5 10/31/2017   PLT 162 10/31/2017   NEUTROABS 2.8 10/31/2017    CMP     Component Value Date/Time   NA 139 10/31/2017 0840   K 4.0 10/31/2017 0840   CL 104 07/18/2017 0505   CO2 21 (L) 10/31/2017 0840   GLUCOSE 92 10/31/2017 0840   BUN 17.7 10/31/2017 0840   CREATININE 0.8 10/31/2017 0840   CALCIUM 9.1 10/31/2017 0840   PROT 7.2 10/31/2017 0840   ALBUMIN 3.7 10/31/2017 0840   AST 16 10/31/2017 0840   ALT 15 10/31/2017 0840   ALKPHOS 77 10/31/2017 0840   BILITOT 0.28 10/31/2017 0840   GFRNONAA >60 07/18/2017 0505   GFRAA >60 07/18/2017 0505   CA 125 on 1123 2018-8 0.8  Medications: I have reviewed the patient's current medications.  Assessment/Plan: 1. Left abdomen/pelvic pain ? CT abdomen/pelvis 06/26/2017-soft tissue implants in the lower anterior peritoneum with implants at the paracolic gutters and left pelvic sidewall ? Elevated CA 125 ? CT biopsy of anterior omental mass 07/04/2017-Metastatic carcinoma consistent with a gynecologic primary ? 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 Cycle 1 adjuvant Taxol/carboplatin 08/05/2017 ? Cycle 2 adjuvant Taxol/carboplatin 08/26/2017 ? Cycle 3 adjuvant Taxol/carboplatin 09/17/2017-Taxol dose reduced secondary to neuropathy and bone pain ? Cycle 4 adjuvant Taxol/carboplatin 10/10/2017 ? Cycle 5 adjuvant Taxol/carboplatin 10/31/2017  2. Depression  3. Hypothyroid  4. Family history of uterine cancer-paternal grandmother    Disposition: Anna [name] appears stable.  She is tolerating the chemotherapy well.  She will complete cycle 5 adjuvant therapy today.  She will return for an office visit and cycle 6 in 3 weeks.  15 minutes were spent with the patient today.  The majority of the time was used for counseling and coordination of care.  [name], MD  10/31/2017  9:28 AM

## 2017-10-31 NOTE — Telephone Encounter (Signed)
Gave avs waiting on 1/4

## 2017-10-31 NOTE — Patient Instructions (Signed)
[facility] Discharge Instructions for Patients Receiving Chemotherapy  Today you received the following chemotherapy agents: Carboplatin and Taxol  To help prevent nausea and vomiting after your treatment, we encourage you to take your nausea medication as prescribed.   If you develop nausea and vomiting that is not controlled by your nausea medication, call the clinic.   BELOW ARE SYMPTOMS THAT SHOULD BE REPORTED IMMEDIATELY:  *FEVER GREATER THAN 100.5 F  *CHILLS WITH OR WITHOUT FEVER  NAUSEA AND VOMITING THAT IS NOT CONTROLLED WITH YOUR NAUSEA MEDICATION  *UNUSUAL SHORTNESS OF BREATH  *UNUSUAL BRUISING OR BLEEDING  TENDERNESS IN MOUTH AND THROAT WITH OR WITHOUT PRESENCE OF ULCERS  *URINARY PROBLEMS  *BOWEL PROBLEMS  UNUSUAL RASH Items with * indicate a potential emergency and should be followed up as soon as possible.  Feel free to call the clinic should you have any questions or concerns. The clinic phone number is [phone].  Please show the CHEMO ALERT CARD at check-in to the Emergency Department and triage nurse.   

## 2017-11-01 LAB — CA 125: CANCER ANTIGEN (CA) 125: 9.1 U/mL (ref 0.0–38.1)

## 2017-11-04 ENCOUNTER — Other Ambulatory Visit: Payer: Self-pay | Admitting: Internal Medicine

## 2017-11-07 ENCOUNTER — Ambulatory Visit: Payer: Self-pay | Admitting: Genetics

## 2017-11-07 ENCOUNTER — Other Ambulatory Visit: Payer: Self-pay | Admitting: Internal Medicine

## 2017-11-07 ENCOUNTER — Encounter: Payer: Self-pay | Admitting: Genetics

## 2017-11-07 DIAGNOSIS — Z1379 Encounter for other screening for genetic and chromosomal anomalies: Secondary | ICD-10-CM

## 2017-11-07 DIAGNOSIS — C569 Malignant neoplasm of unspecified ovary: Secondary | ICD-10-CM

## 2017-11-07 DIAGNOSIS — Z803 Family history of malignant neoplasm of breast: Secondary | ICD-10-CM

## 2017-11-07 DIAGNOSIS — C5702 Malignant neoplasm of left fallopian tube: Secondary | ICD-10-CM

## 2017-11-07 HISTORY — DX: Encounter for other screening for genetic and chromosomal anomalies: Z13.79

## 2017-11-07 NOTE — Progress Notes (Signed)
HPI: Anna [name] was previously seen in the [facility] clinic on 09/09/2017 due to a personal history of ovarian/fallopian tube cancer and concerns regarding a hereditary predisposition to cancer. Please refer to our prior cancer genetics clinic note for more information regarding Anna [name]'s medical, social and family histories, and our assessment and recommendations, at the time. Anna [name] recent genetic test results were disclosed to her, as well as recommendations warranted by these results. These results and recommendations are discussed in more detail below.  CANCER HISTORY:    Carcinomatosis ([place])   07/07/2017 Initial Diagnosis    Carcinomatosis ([place])        FAMILY HISTORY:  We obtained a detailed, 4-generation family history.  Significant diagnoses are listed below: Family History  Problem Relation Age of Onset  . Hyperlipidemia Mother   . Hypertension Mother   . Alzheimer's disease Mother   . Arthritis Father   . Hyperlipidemia Father   . Heart disease Father        first MI age 80  . Hypertension Father   . Hyperlipidemia Brother   . Heart disease Brother        heart disease dx at a youg age  . Hypertension Brother   . Alzheimer's disease Maternal Aunt   . Hyperlipidemia Maternal Uncle   . Heart disease Maternal Uncle   . Sudden death Maternal Uncle 42       massive heart attack in doctors office  . Arthritis Maternal Grandmother   . Hyperlipidemia Maternal Grandmother   . Stroke Maternal Grandmother   . Hypertension Maternal Grandmother   . Alzheimer's disease Maternal Grandmother   . Alcohol abuse Maternal Grandfather   . Hypertension Maternal Grandfather   . Diabetes Maternal Grandfather   . Cancer Paternal Grandmother        Ovarian  . Hypertension Paternal Grandmother   . Alcohol abuse Paternal Grandfather   . Hyperlipidemia Paternal Grandfather   . Heart disease Paternal Grandfather   . Hypertension Paternal Grandfather   .  Breast cancer Cousin        pat cousin   Anna [name] has a 80 year-old son and a 75 year-old daughter with no history of cancer.  Her daughter reportedly had some genetic testing before her mother's diagnosis due to a paternal family history of cervical cancer.  Anna [name] reports her daughter had 'the lowest risk factor'.  This daughter has a 100 year-old son and an 93 year-old daughter.  Anna [name]' son has a 72 year-old son.   Anna [name] has 2 brothers: -1 brother is 91 with no history of cancer.  He has a son who is 58 and recently diagnosed with AML.   -1 brother is 64 with no history of cancer.  He had 3 children.  One son died at 21 due to an allergic reaction to fire an bites, one son is 58 with no history of cancer and one daughter is 71 and had cervical lesions and a diagnosis of lupus.   Anna [name]' father died at 70 due to heart disease.  He had 1 sister who died in her 61's with her uterus and ovaries intact and no cancer. This sister had a daughter who had breast cancer in her late 33's.  Anna [name]' paternal grandfather died in his 74's with no history of cancer.  His mother died at 51 due to uterine cancer.  Anna [name] paternal grandmother died at 57 due to cervical and bladder cancer.  Anna [name]' mother died at 65 due to Alzheimer's Disease. She had her uterus and ovaries intact.  Anna [name] had 7 maternal aunts and uncles.  No history of cancer reported in these aunts and uncles.  All of them lived to ages 25+ with the exception of one uncle who died of a heart attack at 59.   Anna [name] has several maternal cousins, none with any known history of cancer.  Anna [name]' maternal grandfather died in his 49's with no cancer, and her maternal grandmother die at 13 with no history of cancer.    Patient's maternal ancestors are of English descent, and paternal ancestors are of English/Irish descent. There is no reported Ashkenazi Jewish ancestry. There is no known  consanguinity.  GENETIC TEST RESULTS: Genetic testing performed through the [facility] reported out on 09/23/2017 showed no pathogenic mutations. The [facility] gene panel offered by [employer] includes sequencing and deletion/duplication testing of the following 28 genes: APC, ATM, BARD1, BMPR1A, BRCA1, BRCA2, BRIP1, CHD1, CDK4, CDKN2A, CHEK2, EPCAM (large rearrangement only), MLH1, MSH2, MSH6, MUTYH, NBN, PALB2, PMS2, PTEN, RAD51C, RAD51D, SMAD4, STK11, and TP53. Sequencing was performed for select regions of POLE and POLD1, and large rearrangement analysis was performed for select regions of GREM1.  HRD Tumor testing Results:  No deleterious BRCA1 or BRCA2 mutations were identified in the tumor.   Genomic Instability Status (HRD) was negative.    The test report will be scanned into EPIC and will be located under the Molecular Pathology section of the Results Review tab.A portion of the result report is included below for reference.      We discussed with Anna [name] that because current genetic testing is not perfect, it is possible there may be a gene mutation in one of these genes that current testing cannot detect, but that chance is small. We also discussed, that there could be another gene that has not yet been discovered, or that we have not yet tested, that is responsible for the cancer diagnoses in the family. It is also possible there is a hereditary cause for the cancer in the family that Anna [name] did not inherit and therefore was not identified in her testing.  Therefore, it is important to remain in touch with cancer genetics in the future so that we can continue to offer Anna [name] the most up to date genetic testing.   ADDITIONAL GENETIC TESTING: We discussed with Anna [name] that there are other genes that are associated with increased cancer risk that can be analyzed. The laboratories that offer this testing look at these additional genes via a hereditary cancer  gene panel. Should Anna [name] wish to pursue additional genetic testing, we are happy to discuss and coordinate this testing, at any time.    CANCER SCREENING RECOMMENDATIONS: This result is reassuring and indicates that it is unlikely Anna [name] has an increased risk for a future cancer due to a mutation in one of these genes. This normal test also suggests that Anna [name]'s cancer was most likely not due to an inherited predisposition associated with one of these genes.  Most cancers happen by chance and this negative test suggests that her cancer may fall into this category.   However, it does not completely rule out the possibility of a hereditary basis for her cancer.   It is still possible that there could be genetic mutations that are undetectable by current technology, or genetic mutations in genes that have not been tested or identified  to increase cancer risk. Therefore, it is recommended she continue to follow the cancer management and screening guidelines provided by her oncology and primary healthcare provider. Other factors such as her personal and family history may still affect her cancer risk.  The Negative results from HRD tumor testing did not identify HRD or any BRCA1/1 mutations in the tumor.  These results did not find any indication in this tumor sample that would suggest PARP inhibitors would be effective.    RECOMMENDATIONS FOR FAMILY MEMBERS: Women in this family might be at some increased risk of developing cancer, over the general population risk, simply due to the family history of cancer. We recommended women in this family have a yearly mammogram beginning at age 57, or 78 years younger than the earliest onset of cancer, an annual clinical breast exam, and perform monthly breast self-exams. Women in this family should also have a gynecological exam as recommended by their primary provider. All family members should have a colonoscopy by age 69.  All family members  should inform their physicians about the family history of cancer so their doctors can make the most appropriate screening recommendations for them.   Based on Ms. Etchison's family history, we recommended her maternal cousin with breast cancer in her late 43's, have genetic counseling and testing. Ms. Dibuono will let us know if we can be of any assistance in coordinating genetic counseling and/or testing for these family members.   FOLLOW-UP: Lastly, we discussed with Ms. Nourse that cancer genetics is a rapidly advancing field and it is possible that new genetic tests will be appropriate for her and/or her family members in the future. We encouraged her to remain in contact with cancer genetics on an annual basis so we can update her personal and family histories and let her know of advances in cancer genetics that may benefit this family.   Our contact number was provided. Ms. Basso questions were answered to her satisfaction, and she knows she is welcome to call us at anytime with additional questions or concerns.   Ferol Luz, MS Genetic Counselor [email or web address]

## 2017-11-16 ENCOUNTER — Other Ambulatory Visit: Payer: Self-pay | Admitting: Oncology

## 2017-11-21 ENCOUNTER — Telehealth: Payer: Self-pay | Admitting: Oncology

## 2017-11-21 ENCOUNTER — Telehealth: Payer: Self-pay | Admitting: Gynecologic Oncology

## 2017-11-21 ENCOUNTER — Other Ambulatory Visit (HOSPITAL_BASED_OUTPATIENT_CLINIC_OR_DEPARTMENT_OTHER): Payer: 59

## 2017-11-21 ENCOUNTER — Ambulatory Visit (HOSPITAL_BASED_OUTPATIENT_CLINIC_OR_DEPARTMENT_OTHER): Payer: 59 | Admitting: Oncology

## 2017-11-21 VITALS — BP 96/71 | HR 86 | Temp 98.4°F | Resp 17 | Ht 63.0 in | Wt 171.2 lb

## 2017-11-21 DIAGNOSIS — C7961 Secondary malignant neoplasm of right ovary: Secondary | ICD-10-CM

## 2017-11-21 DIAGNOSIS — C7982 Secondary malignant neoplasm of genital organs: Secondary | ICD-10-CM

## 2017-11-21 DIAGNOSIS — C5702 Malignant neoplasm of left fallopian tube: Secondary | ICD-10-CM

## 2017-11-21 DIAGNOSIS — C569 Malignant neoplasm of unspecified ovary: Secondary | ICD-10-CM

## 2017-11-21 DIAGNOSIS — F329 Major depressive disorder, single episode, unspecified: Secondary | ICD-10-CM

## 2017-11-21 DIAGNOSIS — C786 Secondary malignant neoplasm of retroperitoneum and peritoneum: Secondary | ICD-10-CM | POA: Diagnosis not present

## 2017-11-21 LAB — COMPREHENSIVE METABOLIC PANEL
ALT: 19 U/L (ref 0–55)
AST: 18 U/L (ref 5–34)
Albumin: 3.7 g/dL (ref 3.5–5.0)
Alkaline Phosphatase: 80 U/L (ref 40–150)
Anion Gap: 7 mEq/L (ref 3–11)
BUN: 12.2 mg/dL (ref 7.0–26.0)
CALCIUM: 9.3 mg/dL (ref 8.4–10.4)
CHLORIDE: 106 meq/L (ref 98–109)
CO2: 26 mEq/L (ref 22–29)
CREATININE: 0.9 mg/dL (ref 0.6–1.1)
EGFR: >60 mL/min/{1.73_m2} (ref >60)
Glucose: 96 mg/dl (ref 70–140)
Potassium: 4.3 mEq/L (ref 3.5–5.1)
Sodium: 140 mEq/L (ref 136–145)
Total Bilirubin: 0.26 mg/dL (ref 0.20–1.20)
Total Protein: 7.3 g/dL (ref 6.4–8.3)

## 2017-11-21 LAB — CBC WITH DIFFERENTIAL/PLATELET
BASO%: 0.2 % (ref 0.0–2.0)
BASOS ABS: 0 10*3/uL (ref 0.0–0.1)
EOS ABS: 0 10*3/uL (ref 0.0–0.5)
EOS%: 0.4 % (ref 0.0–7.0)
HCT: 31.3 % — ABNORMAL LOW (ref 34.8–46.6)
HGB: 10.1 g/dL — ABNORMAL LOW (ref 11.6–15.9)
LYMPH%: 56.8 % — ABNORMAL HIGH (ref 14.0–49.7)
MCH: 33 pg (ref 25.1–34.0)
MCHC: 32.3 g/dL (ref 31.5–36.0)
MCV: 102.3 fL — AB (ref 79.5–101.0)
MONO#: 0.6 10*3/uL (ref 0.1–0.9)
MONO%: 11.6 % (ref 0.0–14.0)
NEUT#: 1.5 10*3/uL (ref 1.5–6.5)
NEUT%: 31 % — ABNORMAL LOW (ref 38.4–76.8)
Platelets: 145 10*3/uL (ref 145–400)
RBC: 3.06 10*6/uL — ABNORMAL LOW (ref 3.70–5.45)
RDW: 17.7 % — ABNORMAL HIGH (ref 11.2–14.5)
WBC: 4.8 10*3/uL (ref 3.9–10.3)
lymph#: 2.7 10*3/uL (ref 0.9–3.3)

## 2017-11-21 NOTE — Telephone Encounter (Signed)
Scheduled appt per 1/4 los - Gave patient AVS and calender per los.  

## 2017-11-21 NOTE — Progress Notes (Signed)
  [place] OFFICE PROGRESS NOTE   Diagnosis: Fallopian tube carcinoma  INTERVAL HISTORY:   Anna [name] returns as scheduled.  She completed another cycle of Taxol/carboplatin 10/31/2017.  She reports malaise lasting for approximately 1 week following chemotherapy.  She had bone pain, relieved with tramadol.  Nausea was relieved with Compazine.  She feels well at present.  Objective:  Vital signs in last 24 hours:  Blood pressure 96/71, pulse 86, temperature 98.4 F (36.9 C), temperature source Oral, resp. rate 17, height '5\' 3"'$  (1.6 m), weight 171 lb 3.2 oz (77.7 kg), SpO2 100 %.    HEENT: No thrush or ulcers Resp: Lungs clear bilaterally Cardio: Regular rate and rhythm GI: No hepatomegaly, no apparent ascites, nontender, no mass Vascular: No leg edema  Lab Results:  Lab Results  Component Value Date   WBC 4.8 11/21/2017   HGB 10.1 (L) 11/21/2017   HCT 31.3 (L) 11/21/2017   MCV 102.3 (H) 11/21/2017   PLT 145 11/21/2017   NEUTROABS 1.5 11/21/2017    CMP     Component Value Date/Time   NA 140 11/21/2017 0826   K 4.3 11/21/2017 0826   CL 104 07/18/2017 0505   CO2 26 11/21/2017 0826   GLUCOSE 96 11/21/2017 0826   BUN 12.2 11/21/2017 0826   CREATININE 0.9 11/21/2017 0826   CALCIUM 9.3 11/21/2017 0826   PROT 7.3 11/21/2017 0826   ALBUMIN 3.7 11/21/2017 0826   AST 18 11/21/2017 0826   ALT 19 11/21/2017 0826   ALKPHOS 80 11/21/2017 0826   BILITOT 0.26 11/21/2017 0826   GFRNONAA >60 07/18/2017 0505   GFRAA >60 07/18/2017 0505  CA 125 on 10/31/2017 -9.1  Medications: I have reviewed the patient's current medications.   Assessment/Plan:  1. Left abdomen/pelvic pain ? CT abdomen/pelvis 06/26/2017-soft tissue implants in the lower anterior peritoneum with implants at the paracolic gutters and left pelvic sidewall ? Negative MYRIAD hereditary cancer panel ? Elevated CA 125 ? CT biopsy of anterior omental mass 07/04/2017-Metastatic carcinoma  consistent with a gynecologic primary ? 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 Cycle 1 adjuvant Taxol/carboplatin 08/05/2017 ? Cycle 2 adjuvant Taxol/carboplatin 08/26/2017 ? Cycle 3 adjuvant Taxol/carboplatin 09/17/2017-Taxol dose reduced secondary to neuropathy and bone pain ? Cycle 4 adjuvant Taxol/carboplatin 10/10/2017 ? Cycle 5 adjuvant Taxol/carboplatin 10/31/2017 ? Cycle 6 adjuvant Taxol/carboplatin 11/21/2017  2. Depression  3. Hypothyroid  4. Family history of uterine cancer-paternal grandmother      Disposition: Anna [name] has completed 5 cycles of adjuvant Taxol/carboplatin.  She will complete cycle 6 today.  She has borderline neutropenia today.  She will call us for symptoms of an infection.  She is in clinical remission from the fallopian tube carcinoma.  I will contact GYN oncology to discuss her eligibility for a PARP study at [facility] and also to consider the indication for maintenance Avastin.  Anna [name] will return for an office visit in 3 months.  We will see her sooner based on the discussion with GYN oncology.  [name], MD  11/21/2017  9:16 AM

## 2017-11-21 NOTE — Telephone Encounter (Signed)
Spoke with patient about Dr. Elenora Gamma recommendations for PARP trial at [facility].  Patient stating she is very interested but needs to know the travel expectations.  Advised her that I would reach out to [facility] and call her back with additional information.

## 2017-11-22 ENCOUNTER — Ambulatory Visit (HOSPITAL_BASED_OUTPATIENT_CLINIC_OR_DEPARTMENT_OTHER): Payer: 59

## 2017-11-22 VITALS — BP 129/79 | HR 94 | Temp 97.9°F

## 2017-11-22 DIAGNOSIS — C786 Secondary malignant neoplasm of retroperitoneum and peritoneum: Secondary | ICD-10-CM

## 2017-11-22 DIAGNOSIS — Z5111 Encounter for antineoplastic chemotherapy: Secondary | ICD-10-CM | POA: Diagnosis not present

## 2017-11-22 DIAGNOSIS — C7982 Secondary malignant neoplasm of genital organs: Secondary | ICD-10-CM | POA: Diagnosis not present

## 2017-11-22 DIAGNOSIS — C5702 Malignant neoplasm of left fallopian tube: Secondary | ICD-10-CM

## 2017-11-22 DIAGNOSIS — C7961 Secondary malignant neoplasm of right ovary: Secondary | ICD-10-CM

## 2017-11-22 LAB — CA 125: Cancer Antigen (CA) 125: 8.5 U/mL (ref 0.0–38.1)

## 2017-11-22 MED ORDER — SODIUM CHLORIDE 0.9 % IV SOLN
528.0000 mg | Freq: Once | INTRAVENOUS | Status: AC
  Administered 2017-11-22: 530 mg via INTRAVENOUS
  Filled 2017-11-22: qty 53

## 2017-11-22 MED ORDER — SODIUM CHLORIDE 0.9 % IV SOLN
Freq: Once | INTRAVENOUS | Status: AC
  Administered 2017-11-22: 09:00:00 via INTRAVENOUS

## 2017-11-22 MED ORDER — DIPHENHYDRAMINE HCL 50 MG/ML IJ SOLN
INTRAMUSCULAR | Status: AC
  Filled 2017-11-22: qty 1

## 2017-11-22 MED ORDER — DIPHENHYDRAMINE HCL 50 MG/ML IJ SOLN
25.0000 mg | Freq: Once | INTRAMUSCULAR | Status: AC
  Administered 2017-11-22: 25 mg via INTRAVENOUS

## 2017-11-22 MED ORDER — PALONOSETRON HCL INJECTION 0.25 MG/5ML
INTRAVENOUS | Status: AC
  Filled 2017-11-22: qty 5

## 2017-11-22 MED ORDER — FAMOTIDINE IN NACL 20-0.9 MG/50ML-% IV SOLN
20.0000 mg | Freq: Once | INTRAVENOUS | Status: AC
  Administered 2017-11-22: 20 mg via INTRAVENOUS

## 2017-11-22 MED ORDER — SODIUM CHLORIDE 0.9 % IV SOLN
20.0000 mg | Freq: Once | INTRAVENOUS | Status: AC
  Administered 2017-11-22: 20 mg via INTRAVENOUS
  Filled 2017-11-22: qty 2

## 2017-11-22 MED ORDER — PALONOSETRON HCL INJECTION 0.25 MG/5ML
0.2500 mg | Freq: Once | INTRAVENOUS | Status: AC
  Administered 2017-11-22: 0.25 mg via INTRAVENOUS

## 2017-11-22 MED ORDER — SODIUM CHLORIDE 0.9 % IV SOLN
140.0000 mg/m2 | Freq: Once | INTRAVENOUS | Status: AC
  Administered 2017-11-22: 258 mg via INTRAVENOUS
  Filled 2017-11-22: qty 43

## 2017-11-22 MED ORDER — FAMOTIDINE IN NACL 20-0.9 MG/50ML-% IV SOLN
INTRAVENOUS | Status: AC
  Filled 2017-11-22: qty 50

## 2017-11-22 NOTE — Patient Instructions (Signed)
   [facility] Discharge Instructions for Patients Receiving Chemotherapy  Today you received the following chemotherapy agents Taxol and Carboplatin   To help prevent nausea and vomiting after your treatment, we encourage you to take your nausea medication as directed.    If you develop nausea and vomiting that is not controlled by your nausea medication, call the clinic.   BELOW ARE SYMPTOMS THAT SHOULD BE REPORTED IMMEDIATELY:  *FEVER GREATER THAN 100.5 F  *CHILLS WITH OR WITHOUT FEVER  NAUSEA AND VOMITING THAT IS NOT CONTROLLED WITH YOUR NAUSEA MEDICATION  *UNUSUAL SHORTNESS OF BREATH  *UNUSUAL BRUISING OR BLEEDING  TENDERNESS IN MOUTH AND THROAT WITH OR WITHOUT PRESENCE OF ULCERS  *URINARY PROBLEMS  *BOWEL PROBLEMS  UNUSUAL RASH Items with * indicate a potential emergency and should be followed up as soon as possible.  Feel free to call the clinic should you have any questions or concerns. The clinic phone number is [phone].  Please show the CHEMO ALERT CARD at check-in to the Emergency Department and triage nurse.   

## 2017-11-25 ENCOUNTER — Telehealth: Payer: Self-pay | Admitting: Gynecologic Oncology

## 2017-11-25 ENCOUNTER — Ambulatory Visit: Payer: 59

## 2017-11-25 NOTE — Telephone Encounter (Signed)
Called patient and discussed information about [facility] trial.  Information below:  Patients will need to travel to [facility] to receive treatment on the [place] trial as we can only dispense study medication from [facility]. Study treatment continues in 28-day cycles with the exception of cycle 1 (there is a day 15 visit) until 24 months after initiation of study treatment.  We might be able to do pre-treatment labs closer to the patient's home, however that is not preferred as we'll have to re-draw labs at [facility] in study specific central review tubes. Tumor assessment scans will be done at [facility] every 12 weeks regardless of cycle treatment visits.  Patient stating she would like to discuss with Dr. Benay Spice and she would contact our office if she decided to proceed.

## 2017-11-26 ENCOUNTER — Other Ambulatory Visit: Payer: Self-pay | Admitting: Internal Medicine

## 2017-11-28 ENCOUNTER — Other Ambulatory Visit: Payer: Self-pay | Admitting: Medical Oncology

## 2017-12-01 ENCOUNTER — Telehealth: Payer: Self-pay

## 2017-12-01 NOTE — Telephone Encounter (Signed)
Patient called about clinical trial. Stated that she wanted Dr. Gearldine Shown "honest opinion" about the trial. States that she was informed that it "could be IV or pill". Didn't feel that Dr. Benay Spice wanted an IV trial due to "just finishing chemo IV and doing it without a port". Will make MD aware. Patient voiced understanding.

## 2017-12-02 NOTE — Telephone Encounter (Signed)
Left message telling pt. Tumor testing results were available and were sent directly to Dr. Myriam Jacobson office.  I just wanted to make sure she disclosed these to you.   Told patient to call back to discuss tumor testing results.  These results and a summary of both her germline results (precviously disclosed) as well as tumor testing results was mailed to her with a copy of both results.

## 2017-12-04 ENCOUNTER — Telehealth: Payer: Self-pay

## 2017-12-04 NOTE — Telephone Encounter (Signed)
Spoke with patient regarding call for advice/information from Wednesday. Per Dr. Benay Spice, he will be calling her. Patient knows to call with any further questions/concerns. Voiced understanding.

## 2017-12-05 NOTE — Telephone Encounter (Signed)
Discussed with patient 12/05/2016

## 2017-12-11 ENCOUNTER — Encounter: Payer: Self-pay | Admitting: Gynecologic Oncology

## 2017-12-11 ENCOUNTER — Ambulatory Visit
Admission: RE | Admit: 2017-12-11 | Discharge: 2017-12-11 | Disposition: A | Discharge status: Home / Self Care | Payer: 59 | Source: Ambulatory Visit | Attending: Internal Medicine | Admitting: Internal Medicine

## 2017-12-11 DIAGNOSIS — Z1239 Encounter for other screening for malignant neoplasm of breast: Secondary | ICD-10-CM

## 2017-12-13 ENCOUNTER — Other Ambulatory Visit: Payer: Self-pay | Admitting: Family Medicine

## 2018-01-14 ENCOUNTER — Telehealth: Payer: Self-pay | Admitting: Emergency Medicine

## 2018-01-14 NOTE — Telephone Encounter (Signed)
Pt called c/o of left hip pain rated 8/10. Pt is concerned it is related to cancer as this is what she felt when first diagnosed. Pt requesting to see Dr.Sherrill. MD made aware and will see her on 3/4 @ 12:30. Pt made aware.

## 2018-01-19 ENCOUNTER — Inpatient Hospital Stay: Payer: 59 | Attending: Oncology | Admitting: Oncology

## 2018-01-19 VITALS — BP 110/70 | HR 94 | Temp 98.0°F | Resp 18 | Ht 63.0 in | Wt 174.8 lb

## 2018-01-19 DIAGNOSIS — Z8049 Family history of malignant neoplasm of other genital organs: Secondary | ICD-10-CM | POA: Insufficient documentation

## 2018-01-19 DIAGNOSIS — F329 Major depressive disorder, single episode, unspecified: Secondary | ICD-10-CM | POA: Insufficient documentation

## 2018-01-19 DIAGNOSIS — Z90710 Acquired absence of both cervix and uterus: Secondary | ICD-10-CM | POA: Insufficient documentation

## 2018-01-19 DIAGNOSIS — C5702 Malignant neoplasm of left fallopian tube: Secondary | ICD-10-CM | POA: Insufficient documentation

## 2018-01-19 DIAGNOSIS — C786 Secondary malignant neoplasm of retroperitoneum and peritoneum: Secondary | ICD-10-CM | POA: Insufficient documentation

## 2018-01-19 DIAGNOSIS — R0781 Pleurodynia: Secondary | ICD-10-CM

## 2018-01-19 DIAGNOSIS — M25552 Pain in left hip: Secondary | ICD-10-CM | POA: Diagnosis not present

## 2018-01-19 DIAGNOSIS — Z90722 Acquired absence of ovaries, bilateral: Secondary | ICD-10-CM | POA: Diagnosis not present

## 2018-01-19 DIAGNOSIS — E039 Hypothyroidism, unspecified: Secondary | ICD-10-CM

## 2018-01-19 NOTE — Progress Notes (Signed)
[place] OFFICE PROGRESS NOTE   Diagnosis: Fallopian tube cancer  INTERVAL HISTORY:   Anna [name] returns prior to his scheduled visit.  She reports discomfort in the left "hip "region for the past 2-3 weeks.  The pain is worse with ambulation.  She also has pain medial to the left anterior iliac.  She reports discomfort at the left low medial anterior ribs.  The discomfort is relieved with ibuprofen.  No other complaint.  No trauma.  Objective:  Vital signs in last 24 hours:  Blood pressure 110/70, pulse 94, temperature 98 F (36.7 C), temperature source Oral, resp. rate 18, height 5' 3" (1.6 m), weight 174 lb 12.8 oz (79.3 kg), SpO2 100 %.    Resp: Lungs clear bilaterally Cardio: Regular rate and rhythm GI: Soft and nontender, no mass, no hepatosplenomegaly, no apparent ascites, the area of discomfort localizes to the left lower abdomen medial to the iliac Vascular: No leg edema Musculoskeletal: Tenderness at the low medial left anterior ribs inferior to the bra line.  No rash or mass.  Tender at the left trochanter.  No pain with motion at the left hip.    Lab Results:  Lab Results  Component Value Date   WBC 4.8 11/21/2017   HGB 10.1 (L) 11/21/2017   HCT 31.3 (L) 11/21/2017   MCV 102.3 (H) 11/21/2017   PLT 145 11/21/2017   NEUTROABS 1.5 11/21/2017    CMP     Component Value Date/Time   NA 140 11/21/2017 0826   K 4.3 11/21/2017 0826   CL 104 07/18/2017 0505   CO2 26 11/21/2017 0826   GLUCOSE 96 11/21/2017 0826   BUN 12.2 11/21/2017 0826   CREATININE 0.9 11/21/2017 0826   CALCIUM 9.3 11/21/2017 0826   PROT 7.3 11/21/2017 0826   ALBUMIN 3.7 11/21/2017 0826   AST 18 11/21/2017 0826   ALT 19 11/21/2017 0826   ALKPHOS 80 11/21/2017 0826   BILITOT 0.26 11/21/2017 0826   GFRNONAA >60 07/18/2017 0505   GFRAA >60 07/18/2017 0505     Medications: I have reviewed the patient's current medications.   Assessment/Plan: 1. Left abdomen/pelvic  pain ? CT abdomen/pelvis 06/26/2017-soft tissue implants in the lower anterior peritoneum with implants at the paracolic gutters and left pelvic sidewall ? Negative MYRIAD hereditary cancer panel ? Elevated CA 125 ? CT biopsy of anterior omental mass 07/04/2017-Metastatic carcinoma consistent with a gynecologic primary ? 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 Cycle 1 adjuvant Taxol/carboplatin 08/05/2017 ? Cycle 2 adjuvant Taxol/carboplatin 08/26/2017 ? Cycle 3 adjuvant Taxol/carboplatin 09/17/2017-Taxol dose reduced secondary to neuropathy and bone pain ? Cycle 4 adjuvant Taxol/carboplatin 10/10/2017 ? Cycle 5 adjuvant Taxol/carboplatin 10/31/2017 ? Cycle 6 adjuvant Taxol/carboplatin 11/21/2017  2. Depression  3. Hypothyroid  4. Family history of uterine cancer-paternal grandmother   Disposition: Anna [name] is in clinical remission from fallopian tube carcinoma.  She discussed an adjuvant PARP study with Dr. Alycia Rossetti and decided against enrollment on the clinical trial.  The multiple areas of discomfort today are most likely related to a benign musculoskeletal condition.  She will continue ibuprofen as needed.  She will contact us for persistent or increased pain and we will consider imaging studies.  She will return for an office visit as scheduled on 02/19/2018.  15 minutes were spent with the patient today.  The majority of the time was used for counseling and coordination of care.  [name], MD  01/19/2018  12:53 PM

## 2018-01-21 ENCOUNTER — Telehealth: Payer: Self-pay | Admitting: Internal Medicine

## 2018-01-21 NOTE — Telephone Encounter (Signed)
Copied from [place]. Topic: Quick Communication - See Telephone Encounter >> Jan 21, 2018 12:26 PM Bea Graff, NT wrote: CRM for notification. See Telephone encounter for: Pt states that [facility] and [facility] are both saying they are unable to fill the benazepril-hydrochlorthiazide (LOTENSIN HCT) due to the medication is on backorder. Pt was informed to call her PCP to get a different medication ordered to replace this one. Pt would like rx sent to [facility].   01/21/18.

## 2018-01-22 MED ORDER — LISINOPRIL-HYDROCHLOROTHIAZIDE 20-12.5 MG PO TABS: 1.0000 | ORAL_TABLET | Freq: Every day | ORAL | 3 refills | Status: DC

## 2018-01-22 NOTE — Telephone Encounter (Signed)
Sent in new script for lisinopril/hctz 20-12.[facility]. Patient is aware and confirmed what she was currently taking.

## 2018-01-22 NOTE — Telephone Encounter (Signed)
See request. Thanks. 

## 2018-01-22 NOTE — Telephone Encounter (Signed)
Is there another medication we can send in for her?

## 2018-01-22 NOTE — Telephone Encounter (Signed)
Confirm she is currently taking benazepril 20/12.5 q day.  If so, then can change to Lisinopril/hctz 20/12.5 q day.

## 2018-02-04 ENCOUNTER — Other Ambulatory Visit: Payer: Self-pay | Admitting: Internal Medicine

## 2018-02-05 ENCOUNTER — Other Ambulatory Visit: Payer: Self-pay | Admitting: Internal Medicine

## 2018-02-19 ENCOUNTER — Inpatient Hospital Stay: Payer: BLUE CROSS/BLUE SHIELD | Attending: Oncology | Admitting: Oncology

## 2018-02-19 ENCOUNTER — Telehealth: Payer: Self-pay | Admitting: Oncology

## 2018-02-19 ENCOUNTER — Inpatient Hospital Stay: Payer: BLUE CROSS/BLUE SHIELD

## 2018-02-19 VITALS — BP 116/77 | HR 83 | Temp 98.5°F | Resp 17 | Ht 63.0 in | Wt 176.4 lb

## 2018-02-19 DIAGNOSIS — Z90722 Acquired absence of ovaries, bilateral: Secondary | ICD-10-CM | POA: Diagnosis not present

## 2018-02-19 DIAGNOSIS — F329 Major depressive disorder, single episode, unspecified: Secondary | ICD-10-CM | POA: Insufficient documentation

## 2018-02-19 DIAGNOSIS — C5702 Malignant neoplasm of left fallopian tube: Secondary | ICD-10-CM

## 2018-02-19 DIAGNOSIS — Z9221 Personal history of antineoplastic chemotherapy: Secondary | ICD-10-CM | POA: Insufficient documentation

## 2018-02-19 DIAGNOSIS — C5700 Malignant neoplasm of unspecified fallopian tube: Secondary | ICD-10-CM

## 2018-02-19 DIAGNOSIS — E039 Hypothyroidism, unspecified: Secondary | ICD-10-CM

## 2018-02-19 DIAGNOSIS — Z90710 Acquired absence of both cervix and uterus: Secondary | ICD-10-CM | POA: Insufficient documentation

## 2018-02-19 DIAGNOSIS — Z8589 Personal history of malignant neoplasm of other organs and systems: Secondary | ICD-10-CM

## 2018-02-19 DIAGNOSIS — Z8041 Family history of malignant neoplasm of ovary: Secondary | ICD-10-CM

## 2018-02-19 LAB — CBC WITH DIFFERENTIAL/PLATELET
Basophils Absolute: 0 10*3/uL (ref 0.0–0.1)
Basophils Relative: 0 %
EOS ABS: 0.2 10*3/uL (ref 0.0–0.5)
Eosinophils Relative: 3 %
HEMATOCRIT: 35.1 % (ref 34.8–46.6)
HEMOGLOBIN: 11.7 g/dL (ref 11.6–15.9)
LYMPHS ABS: 2.4 10*3/uL (ref 0.9–3.3)
Lymphocytes Relative: 36 %
MCH: 33.5 pg (ref 25.1–34.0)
MCHC: 33.3 g/dL (ref 31.5–36.0)
MCV: 100.8 fL (ref 79.5–101.0)
Monocytes Absolute: 0.6 10*3/uL (ref 0.1–0.9)
Monocytes Relative: 9 %
NEUTROS ABS: 3.4 10*3/uL (ref 1.5–6.5)
NEUTROS PCT: 52 %
Platelets: 236 10*3/uL (ref 145–400)
RBC: 3.48 MIL/uL — AB (ref 3.70–5.45)
RDW: 13.6 % (ref 11.2–14.5)
WBC: 6.7 10*3/uL (ref 3.9–10.3)

## 2018-02-19 NOTE — Progress Notes (Signed)
  [place] OFFICE PROGRESS NOTE   Diagnosis: Fallopian tube carcinoma  INTERVAL HISTORY:   Anna [name] returns as scheduled.  She feels well.  The left hip discomfort has resolved.  No complaint.  Objective:  Vital signs in last 24 hours:  Blood pressure 116/77, pulse 83, temperature 98.5 F (36.9 C), temperature source Oral, resp. rate 17, height '5\' 3"'$  (1.6 m), weight 176 lb 6.4 oz (80 kg), SpO2 98 %.    HEENT: Neck without mass Lymphatics: No cervical, supraclavicular, axillary, or inguinal nodes Resp: Lungs clear bilaterally Cardio: Regular rate and rhythm GI: No hepatosplenomegaly, no apparent ascites, no mass, nontender Vascular: No leg edema   Lab Results:  Lab Results  Component Value Date   WBC 6.7 02/19/2018   HGB 11.7 02/19/2018   HCT 35.1 02/19/2018   MCV 100.8 02/19/2018   PLT 236 02/19/2018   NEUTROABS 3.4 02/19/2018    CMP     Component Value Date/Time   NA 140 11/21/2017 0826   K 4.3 11/21/2017 0826   CL 104 07/18/2017 0505   CO2 26 11/21/2017 0826   GLUCOSE 96 11/21/2017 0826   BUN 12.2 11/21/2017 0826   CREATININE 0.9 11/21/2017 0826   CALCIUM 9.3 11/21/2017 0826   PROT 7.3 11/21/2017 0826   ALBUMIN 3.7 11/21/2017 0826   AST 18 11/21/2017 0826   ALT 19 11/21/2017 0826   ALKPHOS 80 11/21/2017 0826   BILITOT 0.26 11/21/2017 0826   GFRNONAA >60 07/18/2017 0505   GFRAA >60 07/18/2017 0505    Medications: I have reviewed the patient's current medications.   Assessment/Plan: 1. Left abdomen/pelvic pain ? CT abdomen/pelvis 06/26/2017-soft tissue implants in the lower anterior peritoneum with implants at the paracolic gutters and left pelvic sidewall ? NegativeMYRIADhereditary cancer panel ? Elevated CA 125 ? CT biopsy of anterior omental mass 07/04/2017-Metastatic carcinoma consistent with a gynecologic primary ? 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 Cycle 1 adjuvant Taxol/carboplatin 08/05/2017 ? Cycle 2 adjuvant Taxol/carboplatin 08/26/2017 ? Cycle 3 adjuvant Taxol/carboplatin 09/17/2017-Taxol dose reduced secondary to neuropathy and bone pain ? Cycle 4 adjuvant Taxol/carboplatin 10/10/2017 ? Cycle 5 adjuvant Taxol/carboplatin 10/31/2017 ? Cycle 6 adjuvant Taxol/carboplatin 11/21/2017  2. Depression  3. Hypothyroid  4. Family history of uterine cancer-paternal grandmother    Disposition: Anna [name] is in clinical remission from fallopian tube carcinoma.  We will follow-up on the CA 125 from today.  She will return for office visit and CEA 125 in 3 months.  15 minutes were spent with the patient today.  The majority of the time was used for counseling and coordination of care.  [name], MD  02/19/2018  12:50 PM

## 2018-02-19 NOTE — Telephone Encounter (Signed)
Appointments scheduled AVS/Calendar printed per 4/4 los °

## 2018-02-20 LAB — CA 125: Cancer Antigen (CA) 125: 8.2 U/mL (ref 0.0–38.1)

## 2018-04-16 ENCOUNTER — Telehealth: Payer: Self-pay | Admitting: Internal Medicine

## 2018-04-16 NOTE — Telephone Encounter (Signed)
Copied from [place] [id number]. Topic: Quick Communication - Rx Refill/Question >> Apr 16, 2018  2:24 PM Robina Ade, Helene Kelp D wrote: Medication: levothyroxine (SYNTHROID, LEVOTHROID) 88 MCG tablet  Has the patient contacted their pharmacy? Yes (Agent: If no, request that the patient contact the pharmacy for the refill.) (Agent: If yes, when and what did the pharmacy advise?)  Preferred Pharmacy (with phone number or street name): [place], [place].  Agent: Please be advised that RX refills may take up to 3 business days. We ask that you follow-up with your pharmacy.

## 2018-04-17 ENCOUNTER — Other Ambulatory Visit: Payer: Self-pay | Admitting: *Deleted

## 2018-04-17 MED ORDER — LEVOTHYROXINE SODIUM 88 MCG PO TABS: 88.0000 ug | ORAL_TABLET | Freq: Every day | ORAL | 0 refills | Status: DC

## 2018-04-17 NOTE — Telephone Encounter (Signed)
Rx refilled per protocol. LOV: 10/24/17 lab: 8/18

## 2018-04-24 ENCOUNTER — Ambulatory Visit: Payer: 59 | Admitting: Internal Medicine

## 2018-05-12 ENCOUNTER — Ambulatory Visit: Payer: 59 | Admitting: Internal Medicine

## 2018-05-26 ENCOUNTER — Inpatient Hospital Stay (HOSPITAL_BASED_OUTPATIENT_CLINIC_OR_DEPARTMENT_OTHER): Payer: BLUE CROSS/BLUE SHIELD | Admitting: Oncology

## 2018-05-26 ENCOUNTER — Inpatient Hospital Stay: Payer: BLUE CROSS/BLUE SHIELD | Attending: Oncology

## 2018-05-26 ENCOUNTER — Telehealth: Payer: Self-pay | Admitting: Oncology

## 2018-05-26 VITALS — BP 108/70 | HR 80 | Temp 98.0°F | Resp 17 | Ht 63.0 in | Wt 179.2 lb

## 2018-05-26 DIAGNOSIS — C5702 Malignant neoplasm of left fallopian tube: Secondary | ICD-10-CM

## 2018-05-26 DIAGNOSIS — E039 Hypothyroidism, unspecified: Secondary | ICD-10-CM | POA: Insufficient documentation

## 2018-05-26 DIAGNOSIS — Z90722 Acquired absence of ovaries, bilateral: Secondary | ICD-10-CM | POA: Diagnosis not present

## 2018-05-26 DIAGNOSIS — Z8049 Family history of malignant neoplasm of other genital organs: Secondary | ICD-10-CM | POA: Insufficient documentation

## 2018-05-26 DIAGNOSIS — F329 Major depressive disorder, single episode, unspecified: Secondary | ICD-10-CM | POA: Diagnosis not present

## 2018-05-26 DIAGNOSIS — Z9221 Personal history of antineoplastic chemotherapy: Secondary | ICD-10-CM | POA: Insufficient documentation

## 2018-05-26 DIAGNOSIS — M899 Disorder of bone, unspecified: Secondary | ICD-10-CM | POA: Insufficient documentation

## 2018-05-26 DIAGNOSIS — Z90710 Acquired absence of both cervix and uterus: Secondary | ICD-10-CM | POA: Diagnosis not present

## 2018-05-26 DIAGNOSIS — Z8589 Personal history of malignant neoplasm of other organs and systems: Secondary | ICD-10-CM | POA: Insufficient documentation

## 2018-05-26 DIAGNOSIS — R5383 Other fatigue: Secondary | ICD-10-CM | POA: Diagnosis not present

## 2018-05-26 DIAGNOSIS — C5700 Malignant neoplasm of unspecified fallopian tube: Secondary | ICD-10-CM

## 2018-05-26 NOTE — Progress Notes (Signed)
  [place] OFFICE PROGRESS NOTE   Diagnosis: Fallopian tube carcinoma  INTERVAL HISTORY:   Anna [name] returns as scheduled.  She reports malaise.  No dyspnea.  Good appetite.  No abdominal pain.  She has noted a "indentation "at the center of the parietal skull.  No pain at the skull.   Objective:  Vital signs in last 24 hours:  Blood pressure 108/70, pulse 80, temperature 98 F (36.7 C), temperature source Oral, resp. rate 17, height '5\' 3"'$  (1.6 m), weight 179 lb 3.2 oz (81.3 kg), SpO2 99 %.    HEENT: Neck without mass, soft depression at the mid anterior parietal/posterior frontal skull.  No mass. Lymphatics: No cervical, supraclavicular, or inguinal nodes Resp: Lungs clear bilaterally Cardio: Regular rate and rhythm GI: No hepatosplenomegaly, no mass, nontender, no apparent ascites Vascular: No leg edema   Lab Results:  Lab Results  Component Value Date   WBC 6.7 02/19/2018   HGB 11.7 02/19/2018   HCT 35.1 02/19/2018   MCV 100.8 02/19/2018   PLT 236 02/19/2018   NEUTROABS 3.4 02/19/2018    CMP  Lab Results  Component Value Date   NA 140 11/21/2017   K 4.3 11/21/2017   CL 104 07/18/2017   CO2 26 11/21/2017   GLUCOSE 96 11/21/2017   BUN 12.2 11/21/2017   CREATININE 0.9 11/21/2017   CALCIUM 9.3 11/21/2017   PROT 7.3 11/21/2017   ALBUMIN 3.7 11/21/2017   AST 18 11/21/2017   ALT 19 11/21/2017   ALKPHOS 80 11/21/2017   BILITOT 0.26 11/21/2017   GFRNONAA >60 07/18/2017   GFRAA >60 07/18/2017    Ca125 on 02/19/2018-8.2 Medications: I have reviewed the patient's current medications.   Assessment/Plan:  1. Left abdomen/pelvic pain ? CT abdomen/pelvis 06/26/2017-soft tissue implants in the lower anterior peritoneum with implants at the paracolic gutters and left pelvic sidewall ? NegativeMYRIADhereditary cancer panel ? Elevated CA 125 ? CT biopsy of anterior omental mass 07/04/2017-Metastatic carcinoma consistent with a gynecologic  primary ? 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 Cycle 1 adjuvant Taxol/carboplatin 08/05/2017 ? Cycle 2 adjuvant Taxol/carboplatin 08/26/2017 ? Cycle 3 adjuvant Taxol/carboplatin 09/17/2017-Taxol dose reduced secondary to neuropathy and bone pain ? Cycle 4 adjuvant Taxol/carboplatin 10/10/2017 ? Cycle 5 adjuvant Taxol/carboplatin 10/31/2017 ? Cycle 6 adjuvant Taxol/carboplatin 11/21/2017  2. Depression  3. Hypothyroid  4. Family history of uterine cancer-paternal grandmother    Disposition: Anna [name] is in clinical remission from fallopian tube carcinoma.  We will follow-up on the CA 125 from today. I suspect the soft depression at the midline of the skull is a benign finding.  She will contact us if she develops pain in this area.  She will ask her primary physician to evaluate the skull and her malaise.  I doubt the malaise is related to Fallopian tube carcinoma or the course of chemotherapy.  This [place] will return for an office visit and Ca1 25 in 3 months.  15 minutes were spent with the patient today.  The majority of the time was used for counseling and coordination of care.  [name], MD  05/26/2018  1:17 PM

## 2018-05-26 NOTE — Telephone Encounter (Signed)
Appointments scheduled AVS/Calendar printed per 7/9 los °

## 2018-05-27 LAB — CA 125: Cancer Antigen (CA) 125: 8.2 U/mL (ref 0.0–38.1)

## 2018-06-23 ENCOUNTER — Ambulatory Visit: Payer: BLUE CROSS/BLUE SHIELD | Admitting: Internal Medicine

## 2018-06-23 VITALS — BP 106/70 | HR 93 | Temp 98.1°F | Resp 18 | Wt 176.6 lb

## 2018-06-23 DIAGNOSIS — R5383 Other fatigue: Secondary | ICD-10-CM

## 2018-06-23 DIAGNOSIS — C5702 Malignant neoplasm of left fallopian tube: Secondary | ICD-10-CM

## 2018-06-23 DIAGNOSIS — E039 Hypothyroidism, unspecified: Secondary | ICD-10-CM | POA: Diagnosis not present

## 2018-06-23 DIAGNOSIS — I10 Essential (primary) hypertension: Secondary | ICD-10-CM | POA: Diagnosis not present

## 2018-06-23 DIAGNOSIS — E7800 Pure hypercholesterolemia, unspecified: Secondary | ICD-10-CM | POA: Diagnosis not present

## 2018-06-23 DIAGNOSIS — K219 Gastro-esophageal reflux disease without esophagitis: Secondary | ICD-10-CM

## 2018-06-23 DIAGNOSIS — F329 Major depressive disorder, single episode, unspecified: Secondary | ICD-10-CM | POA: Diagnosis not present

## 2018-06-23 DIAGNOSIS — F32A Depression, unspecified: Secondary | ICD-10-CM

## 2018-06-23 MED ORDER — BUPROPION HCL ER (XL) 300 MG PO TB24: 300.0000 mg | ORAL_TABLET | Freq: Every day | ORAL | 1 refills | Status: DC

## 2018-06-23 MED ORDER — EPINEPHRINE 0.3 MG/0.3ML IJ SOAJ: 0.3000 mg | INTRAMUSCULAR | 1 refills | Status: DC | PRN

## 2018-06-23 MED ORDER — LISINOPRIL-HYDROCHLOROTHIAZIDE 20-12.5 MG PO TABS: 1.0000 | ORAL_TABLET | Freq: Every day | ORAL | 3 refills | Status: DC

## 2018-06-23 MED ORDER — LEVOTHYROXINE SODIUM 88 MCG PO TABS: 88.0000 ug | ORAL_TABLET | Freq: Every day | ORAL | 1 refills | Status: DC

## 2018-06-23 MED ORDER — OMEPRAZOLE 20 MG PO CPDR: DELAYED_RELEASE_CAPSULE | ORAL | 1 refills | Status: DC

## 2018-06-23 MED ORDER — ROSUVASTATIN CALCIUM 5 MG PO TABS: 5.0000 mg | ORAL_TABLET | Freq: Every day | ORAL | 1 refills | Status: DC

## 2018-06-23 NOTE — Progress Notes (Signed)
Patient ID: Anna [name], female   DOB: 24-Apr-1956, 62 y.o.   MRN: [id number]   Subjective:    Patient ID: Anna [name], female    DOB: Apr 08, 1956, 62 y.o.   MRN: [id number]  HPI  Patient here for a scheduled follow up.  She has been seeing oncology for treatment of fallopian tube carcinoma.  Per Dr Gearldine Shown last note - in clinical remission.  She reports she is dong relatively well.  Does report some increased fatigue.  No pain.  Generalized fatigue.  No chest pain.  Breathing stable.  No acid reflux.  No abdominal pain.  Bowels moving.  No urine change.  Handling stress.     Past Medical History:  Diagnosis Date  . Anxiety   . Arthritis   . Cancer [facility])    gyenicological 2018  . Depression   . Diverticulosis   . Family history of adverse reaction to anesthesia    Mother has extreme [place] with anesthesia  . Family history of breast cancer   . Frequent headaches    H/O  . Genetic testing 11/07/2017  . GERD (gastroesophageal reflux disease)   . Heart murmur    Hx of  . History of chicken pox   . Hypercholesterolemia   . Hypertension   . Hypothyroidism   . Pneumonia   . Sleep apnea    No longer wears cpap   Past Surgical History:  Procedure Laterality Date  . ABDOMINAL HYSTERECTOMY     Dr. Denman George 07/17/17  . APPENDECTOMY  1981  . CARPAL TUNNEL RELEASE  2000  . [address]  . EYE SURGERY Bilateral 2008   Lasix eye srg  . LAPAROSCOPY N/A 07/17/2017   Procedure: LAPAROSCOPY DIAGNOSTIC;  Surgeon: Everitt Amber, MD;  Location: [facility];  Service: Gynecology;  Laterality: N/A;  . LAPAROTOMY N/A 07/17/2017   Procedure: EXPLORATORY LAPAROTOMY;  Surgeon: Everitt Amber, MD;  Location: [facility];  Service: Gynecology;  Laterality: N/A;  . OMENTECTOMY N/A 07/17/2017   Procedure: OMENTECTOMY WITH RADICAL TUMOR DEBULKING;  Surgeon: Everitt Amber, MD;  Location: [facility];  Service: Gynecology;  Laterality: N/A;  . TUBAL LIGATION  1985   Family History  Problem  Relation Age of Onset  . Hyperlipidemia Mother   . Hypertension Mother   . Alzheimer's disease Mother   . Arthritis Father   . Hyperlipidemia Father   . Heart disease Father        first MI age 57  . Hypertension Father   . Hyperlipidemia Brother   . Heart disease Brother        heart disease dx at a youg age  . Hypertension Brother   . Alzheimer's disease Maternal Aunt   . Hyperlipidemia Maternal Uncle   . Heart disease Maternal Uncle   . Sudden death Maternal Uncle 42       massive heart attack in doctors office  . Arthritis Maternal Grandmother   . Hyperlipidemia Maternal Grandmother   . Stroke Maternal Grandmother   . Hypertension Maternal Grandmother   . Alzheimer's disease Maternal Grandmother   . Alcohol abuse Maternal Grandfather   . Hypertension Maternal Grandfather   . Diabetes Maternal Grandfather   . Cancer Paternal Grandmother        Ovarian  . Hypertension Paternal Grandmother   . Alcohol abuse Paternal Grandfather   . Hyperlipidemia Paternal Grandfather   . Heart disease Paternal Grandfather   . Hypertension Paternal Grandfather   . Breast cancer  Cousin        pat cousin   Social History   Socioeconomic History  . Marital status: Married    Spouse name: Not on file  . Number of children: 2  . Years of education: Not on file  . Highest education level: Not on file  Occupational History  . Not on file  Social Needs  . Financial resource strain: Not on file  . Food insecurity:    Worry: Not on file    Inability: Not on file  . Transportation needs:    Medical: Not on file    Non-medical: Not on file  Tobacco Use  . Smoking status: Never Smoker  . Smokeless tobacco: Never Used  Substance and Sexual Activity  . Alcohol use: No    Alcohol/week: 0.0 standard drinks  . Drug use: No  . Sexual activity: Yes  Lifestyle  . Physical activity:    Days per week: Not on file    Minutes per session: Not on file  . Stress: Not on file  Relationships  .  Social connections:    Talks on phone: Not on file    Gets together: Not on file    Attends religious service: Not on file    Active member of club or organization: Not on file    Attends meetings of clubs or organizations: Not on file    Relationship status: Not on file  Other Topics Concern  . Not on file  Social History Narrative  . Not on file    Outpatient Encounter Medications as of 06/23/2018  Medication Sig  . acyclovir (ZOVIRAX) 400 MG tablet TAKE 1 TABLET THREE TIMES A DAY FOR 5 DAYS AS NEEDED FOR FLARES  . acyclovir cream (ZOVIRAX) 5 % Use as directed.  Marland Kitchen buPROPion (WELLBUTRIN XL) 300 MG 24 hr tablet Take 1 tablet (300 mg total) by mouth daily.  Marland Kitchen EPINEPHrine 0.3 mg/0.3 mL IJ SOAJ injection Inject 0.3 mLs (0.3 mg total) into the muscle as needed (for anaphylaxis).  Marland Kitchen ibuprofen (ADVIL,MOTRIN) 200 MG tablet Take 200 mg by mouth every 6 (six) hours as needed.  Marland Kitchen levothyroxine (SYNTHROID, LEVOTHROID) 88 MCG tablet Take 1 tablet (88 mcg total) by mouth daily before breakfast.  . lisinopril-hydrochlorothiazide (ZESTORETIC) 20-12.5 MG tablet Take 1 tablet by mouth daily.  . Multiple Vitamin (MULTIVITAMIN WITH MINERALS) TABS tablet Take 1 tablet by mouth daily.  Marland Kitchen omeprazole (PRILOSEC) 20 MG capsule TAKE 1 CAPSULE TWICE A DAY BEFORE MEALS  . rosuvastatin (CRESTOR) 5 MG tablet Take 1 tablet (5 mg total) by mouth daily.  . [DISCONTINUED] buPROPion (WELLBUTRIN XL) 300 MG 24 hr tablet TAKE 1 TABLET DAILY  . [DISCONTINUED] Calcium Carb-Cholecalciferol (CALCIUM 1000 + D PO) Take 1 tablet by mouth daily.  . [DISCONTINUED] EPINEPHrine 0.3 mg/0.3 mL IJ SOAJ injection Inject 0.3 mLs (0.3 mg total) into the muscle as needed (for anaphylaxis).  . [DISCONTINUED] levothyroxine (SYNTHROID, LEVOTHROID) 88 MCG tablet Take 1 tablet (88 mcg total) by mouth daily before breakfast.  . [DISCONTINUED] lisinopril-hydrochlorothiazide (ZESTORETIC) 20-12.5 MG tablet Take 1 tablet by mouth daily.  .  [DISCONTINUED] omeprazole (PRILOSEC) 20 MG capsule TAKE 1 CAPSULE TWICE A DAY BEFORE MEALS  . [DISCONTINUED] oxyCODONE-acetaminophen (PERCOCET/ROXICET) 5-325 MG tablet Take 1-2 tablets by mouth every 4 (four) hours as needed for severe pain.  . [DISCONTINUED] prochlorperazine (COMPAZINE) 10 MG tablet Take 1 tablet (10 mg total) by mouth every 6 (six) hours as needed for nausea or vomiting.  . [DISCONTINUED] rosuvastatin (  CRESTOR) 5 MG tablet TAKE 1 TABLET DAILY  . [DISCONTINUED] traMADol (ULTRAM) 50 MG tablet Take 1 tablet (50 mg total) by mouth every 6 (six) hours as needed.   No facility-administered encounter medications on file as of 06/23/2018.     Review of Systems  Constitutional: Positive for fatigue. Negative for appetite change and unexpected weight change.  HENT: Negative for congestion and sinus pressure.   Respiratory: Negative for cough, chest tightness and shortness of breath.   Cardiovascular: Negative for chest pain, palpitations and leg swelling.  Gastrointestinal: Negative for abdominal pain, diarrhea, nausea and vomiting.  Genitourinary: Negative for difficulty urinating and dysuria.  Musculoskeletal: Negative for joint swelling and myalgias.  Skin: Negative for color change and rash.  Neurological: Negative for dizziness, light-headedness and headaches.  Psychiatric/Behavioral: Negative for agitation and dysphoric mood.       Objective:     Blood pressure rechecked by me:  112/72  Physical Exam  Constitutional: She appears well-developed and well-nourished. No distress.  HENT:  Nose: Nose normal.  Mouth/Throat: Oropharynx is clear and moist.  Neck: Neck supple. No thyromegaly present.  Cardiovascular: Normal rate and regular rhythm.  Pulmonary/Chest: Breath sounds normal. No respiratory distress. She has no wheezes.  Abdominal: Soft. Bowel sounds are normal. There is no tenderness.  Musculoskeletal: She exhibits no edema or tenderness.  Lymphadenopathy:    She  has no cervical adenopathy.  Skin: No rash noted. No erythema.  Psychiatric: She has a normal mood and affect. Her behavior is normal.    BP 106/70 (BP Location: Left Arm, Patient Position: Sitting, Cuff Size: Large)   Pulse 93   Temp 98.1 F (36.7 C) (Oral)   Resp 18   Wt 176 lb 9.6 oz (80.1 kg)   SpO2 98%   BMI 31.28 kg/m  Wt Readings from Last 3 Encounters:  06/23/18 176 lb 9.6 oz (80.1 kg)  05/26/18 179 lb 3.2 oz (81.3 kg)  02/19/18 176 lb 6.4 oz (80 kg)     Lab Results  Component Value Date   WBC 7.6 06/23/2018   HGB 12.2 06/23/2018   HCT 36.3 06/23/2018   PLT 299.0 06/23/2018   GLUCOSE 89 06/23/2018   CHOL 148 06/23/2018   TRIG 112.0 06/23/2018   HDL 52.40 06/23/2018   LDLDIRECT 143.0 05/23/2016   LDLCALC 73 06/23/2018   ALT 30 06/23/2018   AST 29 06/23/2018   NA 139 06/23/2018   K 4.5 06/23/2018   CL 102 06/23/2018   CREATININE 0.87 06/23/2018   BUN 17 06/23/2018   CO2 29 06/23/2018   TSH 2.91 06/23/2018   INR 0.93 07/04/2017    Mm Digital Screening  Result Date: 12/12/2017 CLINICAL DATA:  Screening. EXAM: DIGITAL SCREENING BILATERAL MAMMOGRAM WITH CAD COMPARISON:  Previous exam(s). ACR Breast Density Category b: There are scattered areas of fibroglandular density. FINDINGS: There are no findings suspicious for malignancy. Images were processed with CAD. IMPRESSION: No mammographic evidence of malignancy. A result letter of this screening mammogram will be mailed directly to the patient. RECOMMENDATION: Screening mammogram in one year. (Code:SM-B-01Y) BI-RADS CATEGORY  1: Negative. Electronically Signed   By: Dorise Bullion III M.D   On: 12/12/2017 08:00       Assessment & Plan:   Problem List Items Addressed This Visit    Depression    On wellbutrin.  Overall handling things well.  Follow.        Relevant Medications   buPROPion (WELLBUTRIN XL) 300 MG 24 hr tablet  Essential hypertension, benign - Primary    Blood pressure under good control.   Continue same medication regimen.  Follow pressures.  Follow metabolic panel.        Relevant Medications   rosuvastatin (CRESTOR) 5 MG tablet   lisinopril-hydrochlorothiazide (ZESTORETIC) 20-12.5 MG tablet   EPINEPHrine 0.3 mg/0.3 mL IJ SOAJ injection   Other Relevant Orders   CBC with Differential/Platelet (Completed)   Basic metabolic panel (Completed)   Fatigue    Feel is probably multifactorial.  Recently treated for fallopian tube carcinoma.  Will check cbc,metabolic panel and thyroid function.  Discussed increased activity and walking.  Follow.        GERD (gastroesophageal reflux disease)    Controlled on current regimen.  Follow.       Relevant Medications   omeprazole (PRILOSEC) 20 MG capsule   Hypercholesterolemia    On crestor.  Low cholesterol diet and exercise.  Follow lipid panel and liver function tests.        Relevant Medications   rosuvastatin (CRESTOR) 5 MG tablet   lisinopril-hydrochlorothiazide (ZESTORETIC) 20-12.5 MG tablet   EPINEPHrine 0.3 mg/0.3 mL IJ SOAJ injection   Other Relevant Orders   Hepatic function panel (Completed)   Lipid panel (Completed)   Hypothyroidism    On thyroid replacement.  Follow tsh.       Relevant Medications   levothyroxine (SYNTHROID, LEVOTHROID) 88 MCG tablet   Other Relevant Orders   TSH (Completed)   Malignant neoplasm of fallopian tube ([place])    Followed by oncology.  In clinical remission.  Seeing Dr Benay Spice.            Einar Pheasant, MD

## 2018-06-24 ENCOUNTER — Encounter: Payer: Self-pay | Admitting: Internal Medicine

## 2018-06-24 LAB — CBC WITH DIFFERENTIAL/PLATELET
BASOS PCT: 0.3 % (ref 0.0–3.0)
Basophils Absolute: 0 10*3/uL (ref 0.0–0.1)
EOS ABS: 0.3 10*3/uL (ref 0.0–0.7)
Eosinophils Relative: 3.4 % (ref 0.0–5.0)
HCT: 36.3 % (ref 36.0–46.0)
HEMOGLOBIN: 12.2 g/dL (ref 12.0–15.0)
LYMPHS ABS: 3.4 10*3/uL (ref 0.7–4.0)
Lymphocytes Relative: 45.2 % (ref 12.0–46.0)
MCHC: 33.6 g/dL (ref 30.0–36.0)
MCV: 95 fl (ref 78.0–100.0)
MONO ABS: 0.8 10*3/uL (ref 0.1–1.0)
Monocytes Relative: 11.1 % (ref 3.0–12.0)
NEUTROS PCT: 40 % — AB (ref 43.0–77.0)
Neutro Abs: 3 10*3/uL (ref 1.4–7.7)
Platelets: 299 10*3/uL (ref 150.0–400.0)
RBC: 3.82 Mil/uL — AB (ref 3.87–5.11)
RDW: 14.6 % (ref 11.5–15.5)
WBC: 7.6 10*3/uL (ref 4.0–10.5)

## 2018-06-24 LAB — LIPID PANEL
Cholesterol: 148 mg/dL (ref 0–200)
HDL: 52.4 mg/dL (ref >39.00)
LDL Cholesterol: 73 mg/dL (ref 0–99)
NonHDL: 95.78
Total CHOL/HDL Ratio: 3
Triglycerides: 112 mg/dL (ref 0.0–149.0)
VLDL: 22.4 mg/dL (ref 0.0–40.0)

## 2018-06-24 LAB — HEPATIC FUNCTION PANEL
ALBUMIN: 4.4 g/dL (ref 3.5–5.2)
ALT: 30 U/L (ref 0–35)
AST: 29 U/L (ref 0–37)
Alkaline Phosphatase: 75 U/L (ref 39–117)
Bilirubin, Direct: 0.1 mg/dL (ref 0.0–0.3)
TOTAL PROTEIN: 7.6 g/dL (ref 6.0–8.3)
Total Bilirubin: 0.6 mg/dL (ref 0.2–1.2)

## 2018-06-24 LAB — BASIC METABOLIC PANEL
BUN: 17 mg/dL (ref 6–23)
CHLORIDE: 102 meq/L (ref 96–112)
CO2: 29 mEq/L (ref 19–32)
Calcium: 9.9 mg/dL (ref 8.4–10.5)
Creatinine, Ser: 0.87 mg/dL (ref 0.40–1.20)
GFR: 70.24 mL/min (ref >60.00)
Glucose, Bld: 89 mg/dL (ref 70–99)
POTASSIUM: 4.5 meq/L (ref 3.5–5.1)
Sodium: 139 mEq/L (ref 135–145)

## 2018-06-24 LAB — TSH: TSH: 2.91 u[IU]/mL (ref 0.35–4.50)

## 2018-06-25 ENCOUNTER — Encounter: Payer: Self-pay | Admitting: Internal Medicine

## 2018-06-25 DIAGNOSIS — R5383 Other fatigue: Secondary | ICD-10-CM | POA: Insufficient documentation

## 2018-06-25 NOTE — Assessment & Plan Note (Signed)
Feel is probably multifactorial.  Recently treated for fallopian tube carcinoma.  Will check cbc,metabolic panel and thyroid function.  Discussed increased activity and walking.  Follow.

## 2018-06-25 NOTE — Assessment & Plan Note (Signed)
On wellbutrin.  Overall handling things well.  Follow.

## 2018-06-25 NOTE — Assessment & Plan Note (Signed)
On crestor.  Low cholesterol diet and exercise.  Follow lipid panel and liver function tests.   

## 2018-06-25 NOTE — Assessment & Plan Note (Signed)
Blood pressure under good control.  Continue same medication regimen.  Follow pressures.  Follow metabolic panel.   

## 2018-06-25 NOTE — Assessment & Plan Note (Signed)
Followed by oncology.  In clinical remission.  Seeing Dr Benay Spice.

## 2018-06-25 NOTE — Assessment & Plan Note (Signed)
On thyroid replacement.  Follow tsh.  

## 2018-06-25 NOTE — Assessment & Plan Note (Signed)
Controlled on current regimen.  Follow.  

## 2018-08-17 ENCOUNTER — Telehealth: Payer: Self-pay | Admitting: Oncology

## 2018-08-17 NOTE — Telephone Encounter (Signed)
Returned call to patient re rescheduling 10/8 lab/fu due to husband having surgery. Spoke with GBS and patient can can be seen for f/u 10/16 at 10 am - lab 9:30 am. Gave patient new date/time for lab/fu 10/16 at 9:30 am.

## 2018-08-25 ENCOUNTER — Other Ambulatory Visit: Payer: BLUE CROSS/BLUE SHIELD

## 2018-08-25 ENCOUNTER — Ambulatory Visit: Payer: BLUE CROSS/BLUE SHIELD | Admitting: Oncology

## 2018-09-02 ENCOUNTER — Inpatient Hospital Stay: Payer: BLUE CROSS/BLUE SHIELD | Attending: Oncology

## 2018-09-02 ENCOUNTER — Inpatient Hospital Stay: Payer: BLUE CROSS/BLUE SHIELD | Admitting: Oncology

## 2018-09-02 ENCOUNTER — Telehealth: Payer: Self-pay

## 2018-09-02 VITALS — BP 108/60 | HR 83 | Temp 98.0°F | Resp 17 | Ht 63.0 in | Wt 182.7 lb

## 2018-09-02 DIAGNOSIS — R109 Unspecified abdominal pain: Secondary | ICD-10-CM | POA: Diagnosis not present

## 2018-09-02 DIAGNOSIS — Z90710 Acquired absence of both cervix and uterus: Secondary | ICD-10-CM | POA: Insufficient documentation

## 2018-09-02 DIAGNOSIS — F329 Major depressive disorder, single episode, unspecified: Secondary | ICD-10-CM | POA: Insufficient documentation

## 2018-09-02 DIAGNOSIS — Z23 Encounter for immunization: Secondary | ICD-10-CM | POA: Diagnosis not present

## 2018-09-02 DIAGNOSIS — E039 Hypothyroidism, unspecified: Secondary | ICD-10-CM | POA: Diagnosis not present

## 2018-09-02 DIAGNOSIS — C5702 Malignant neoplasm of left fallopian tube: Secondary | ICD-10-CM

## 2018-09-02 DIAGNOSIS — R971 Elevated cancer antigen 125 [CA 125]: Secondary | ICD-10-CM | POA: Diagnosis not present

## 2018-09-02 DIAGNOSIS — Z90722 Acquired absence of ovaries, bilateral: Secondary | ICD-10-CM | POA: Insufficient documentation

## 2018-09-02 DIAGNOSIS — Z8049 Family history of malignant neoplasm of other genital organs: Secondary | ICD-10-CM | POA: Diagnosis not present

## 2018-09-02 DIAGNOSIS — Z8589 Personal history of malignant neoplasm of other organs and systems: Secondary | ICD-10-CM | POA: Insufficient documentation

## 2018-09-02 MED ORDER — INFLUENZA VAC SPLIT QUAD 0.5 ML IM SUSY
PREFILLED_SYRINGE | INTRAMUSCULAR | Status: AC
  Filled 2018-09-02: qty 0.5

## 2018-09-02 MED ORDER — INFLUENZA VAC SPLIT QUAD 0.5 ML IM SUSY
0.5000 mL | PREFILLED_SYRINGE | Freq: Once | INTRAMUSCULAR | Status: AC
  Administered 2018-09-02: 0.5 mL via INTRAMUSCULAR

## 2018-09-02 NOTE — Telephone Encounter (Signed)
Printed avs and calender of upcoming appointment. Per 10/16 los 

## 2018-09-02 NOTE — Progress Notes (Signed)
  [place] OFFICE PROGRESS NOTE   Diagnosis: Fallopian tube carcinoma  INTERVAL HISTORY:   Anna [name] returns for scheduled visit.  She feels well. She has occasional discomfort at the upper aspect of the abdominal scar.  No other complaint.  Objective:  Vital signs in last 24 hours:  Blood pressure 108/60, pulse 83, temperature 98 F (36.7 C), temperature source Oral, resp. rate 17, height '5\' 3"'$  (1.6 m), weight 182 lb 11.2 oz (82.9 kg), SpO2 99 %.    HEENT: Neck without mass Lymphatics: No cervical, supraclavicular, axillary, or inguinal nodes Resp: Lungs clear bilaterally Cardio: Regular rate and rhythm GI: No hepatosplenomegaly, no apparent ascites, no mass nontender Vascular: Leg edema  Skin: midline Abdominal scar without evidence of recurrent tumor    Lab Results:  Lab Results  Component Value Date   WBC 7.6 06/23/2018   HGB 12.2 06/23/2018   HCT 36.3 06/23/2018   MCV 95.0 06/23/2018   PLT 299.0 06/23/2018   NEUTROABS 3.0 06/23/2018    CMP  Lab Results  Component Value Date   NA 139 06/23/2018   K 4.5 06/23/2018   CL 102 06/23/2018   CO2 29 06/23/2018   GLUCOSE 89 06/23/2018   BUN 17 06/23/2018   CREATININE 0.87 06/23/2018   CALCIUM 9.9 06/23/2018   PROT 7.6 06/23/2018   ALBUMIN 4.4 06/23/2018   AST 29 06/23/2018   ALT 30 06/23/2018   ALKPHOS 75 06/23/2018   BILITOT 0.6 06/23/2018   GFRNONAA >60 07/18/2017   GFRAA >60 07/18/2017     Medications: I have reviewed the patient's current medications.   Assessment/Plan: 1. Left abdomen/pelvic pain ? CT abdomen/pelvis 06/26/2017-soft tissue implants in the lower anterior peritoneum with implants at the paracolic gutters and left pelvic sidewall ? NegativeMYRIADhereditary cancer panel ? Elevated CA 125 ? CT biopsy of anterior omental mass 07/04/2017-Metastatic carcinoma consistent with a gynecologic primary ? 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 Cycle 1 adjuvant Taxol/carboplatin 08/05/2017 ? Cycle 2 adjuvant Taxol/carboplatin 08/26/2017 ? Cycle 3 adjuvant Taxol/carboplatin 09/17/2017-Taxol dose reduced secondary to neuropathy and bone pain ? Cycle 4 adjuvant Taxol/carboplatin 10/10/2017 ? Cycle 5 adjuvant Taxol/carboplatin 10/31/2017 ? Cycle 6 adjuvant Taxol/carboplatin 11/21/2017  2. Depression  3. Hypothyroid  4. Family history of uterine cancer-paternal grandmother     Disposition: [name] remains in clinical remission from the fallopian tube carcinoma.  We will follow-up on the CA 125 from today.  Return for an office and lab visit in 4 months.  15 minutes were spent with the patient today.  The majority of the time was used for counseling and coordination of care.  [name], MD  09/02/2018  10:14 AM

## 2018-09-03 LAB — CA 125: Cancer Antigen (CA) 125: 7.8 U/mL (ref 0.0–38.1)

## 2018-09-21 ENCOUNTER — Other Ambulatory Visit: Payer: Self-pay | Admitting: Internal Medicine

## 2018-09-21 NOTE — Telephone Encounter (Signed)
Copied from [place] [phone]. Topic: Quick Communication - Rx Refill/Question >> Sep 21, 2018  9:38 AM Alfredia Ferguson R wrote: Medication: rosuvastatin (CRESTOR) 5 MG tablet  Has the patient contacted their pharmacy? Yes, script is expired  Preferred Pharmacy (with phone number or street name): [place], [place]. [phone] (Phone) [phone] (Fax)    Agent: Please be advised that RX refills may take up to 3 business days. We ask that you follow-up with your pharmacy.

## 2018-09-22 MED ORDER — ROSUVASTATIN CALCIUM 5 MG PO TABS: 5.0000 mg | ORAL_TABLET | Freq: Every day | ORAL | 1 refills | Status: DC

## 2018-09-22 NOTE — Telephone Encounter (Signed)
Requested Prescriptions  Pending Prescriptions Disp Refills  . rosuvastatin (CRESTOR) 5 MG tablet 90 tablet 1    Sig: Take 1 tablet (5 mg total) by mouth daily.     Cardiovascular:  Antilipid - Statins Passed - 09/21/2018 11:15 AM      Passed - Total Cholesterol in normal range and within 360 days    Cholesterol  Date Value Ref Range Status  06/23/2018 148 0 - 200 mg/dL Final    Comment:    ATP III Classification       Desirable:  < 200 mg/dL               Borderline High:  200 - 239 mg/dL          High:  > = 240 mg/dL         Passed - LDL in normal range and within 360 days    LDL Cholesterol  Date Value Ref Range Status  06/23/2018 73 0 - 99 mg/dL Final         Passed - HDL in normal range and within 360 days    HDL  Date Value Ref Range Status  06/23/2018 52.40 >39.00 mg/dL Final         Passed - Triglycerides in normal range and within 360 days    Triglycerides  Date Value Ref Range Status  06/23/2018 112.0 0.0 - 149.0 mg/dL Final    Comment:    Normal:  <150 mg/dLBorderline High:  150 - 199 mg/dL         Passed - Patient is not pregnant      Passed - Valid encounter within last 12 months    Recent Outpatient Visits          3 months ago Essential hypertension, benign   [facility], Randell Patient, MD   11 months ago Routine general medical examination at a health care facility   [facility], MD   1 year ago Need for Tdap vaccination   [facility] [place], Randell Patient, MD   1 year ago Skin lesion   [place], MD   1 year ago Bronchitis with bronchospasm   [facility] [place], Yvetta Coder, FNP      Future Appointments            In 1 month Einar Pheasant, MD [facility], [facility]

## 2018-10-23 ENCOUNTER — Telehealth: Payer: Self-pay

## 2018-10-23 NOTE — Telephone Encounter (Signed)
Lm to call and speak to [facility]

## 2018-10-23 NOTE — Telephone Encounter (Signed)
Copied from [place] [phone]. Topic: Appointment Scheduling - Scheduling Inquiry for Clinic >> Oct 23, 2018 11:34 AM Ahmed Prima L wrote: Reason for CRM: patient had to cancel her cpe for 36/62 due to a conflict with her husbands appointment. She would really like to be worked in for this instead of waiting until 02/09/19 for the next opening. She would like the nurse to call her back either way

## 2018-11-05 ENCOUNTER — Encounter: Payer: BLUE CROSS/BLUE SHIELD | Admitting: Internal Medicine

## 2018-11-18 DIAGNOSIS — I2699 Other pulmonary embolism without acute cor pulmonale: Secondary | ICD-10-CM

## 2018-11-18 HISTORY — DX: Other pulmonary embolism without acute cor pulmonale: I26.99

## 2018-12-31 ENCOUNTER — Inpatient Hospital Stay: Payer: BLUE CROSS/BLUE SHIELD | Attending: Oncology

## 2018-12-31 ENCOUNTER — Telehealth: Payer: Self-pay | Admitting: Oncology

## 2018-12-31 ENCOUNTER — Inpatient Hospital Stay (HOSPITAL_BASED_OUTPATIENT_CLINIC_OR_DEPARTMENT_OTHER): Payer: BLUE CROSS/BLUE SHIELD | Admitting: Oncology

## 2018-12-31 VITALS — BP 107/82 | HR 89 | Temp 98.3°F | Resp 19 | Wt 186.0 lb

## 2018-12-31 DIAGNOSIS — C786 Secondary malignant neoplasm of retroperitoneum and peritoneum: Secondary | ICD-10-CM

## 2018-12-31 DIAGNOSIS — F329 Major depressive disorder, single episode, unspecified: Secondary | ICD-10-CM | POA: Insufficient documentation

## 2018-12-31 DIAGNOSIS — Z9221 Personal history of antineoplastic chemotherapy: Secondary | ICD-10-CM | POA: Diagnosis not present

## 2018-12-31 DIAGNOSIS — E039 Hypothyroidism, unspecified: Secondary | ICD-10-CM | POA: Insufficient documentation

## 2018-12-31 DIAGNOSIS — C5702 Malignant neoplasm of left fallopian tube: Secondary | ICD-10-CM

## 2018-12-31 NOTE — Progress Notes (Signed)
  [place] OFFICE PROGRESS NOTE   Diagnosis: Fallopian tube carcinoma  INTERVAL HISTORY:   [name] returns as scheduled.  She feels well.  She recently had a "sinus "infection.  She developed a tender enlarged lymph node in the left upper neck following this infection.  This has resolved.  No other complaint.  Objective:  Vital signs in last 24 hours:  There were no vitals taken for this visit.    HEENT: Neck without mass, no mass or enlarged lymph node in the left upper neck/preauricular area Lymphatics: No cervical, supraclavicular, axillary, or inguinal nodes Resp: Lungs clear bilaterally Cardio: Regular rate and rhythm GI: No hepatosplenomegaly, no apparent ascites, no mass, nontender Vascular: No leg edema   Lab Results:  Lab Results  Component Value Date   WBC 7.6 06/23/2018   HGB 12.2 06/23/2018   HCT 36.3 06/23/2018   MCV 95.0 06/23/2018   PLT 299.0 06/23/2018   NEUTROABS 3.0 06/23/2018    CMP  Lab Results  Component Value Date   NA 139 06/23/2018   K 4.5 06/23/2018   CL 102 06/23/2018   CO2 29 06/23/2018   GLUCOSE 89 06/23/2018   BUN 17 06/23/2018   CREATININE 0.87 06/23/2018   CALCIUM 9.9 06/23/2018   PROT 7.6 06/23/2018   ALBUMIN 4.4 06/23/2018   AST 29 06/23/2018   ALT 30 06/23/2018   ALKPHOS 75 06/23/2018   BILITOT 0.6 06/23/2018   GFRNONAA >60 07/18/2017   GFRAA >60 07/18/2017   09/02/2018: CA 125-7.8  Medications: I have reviewed the patient's current medications.   Assessment/Plan: 1. Left abdomen/pelvic pain ? CT abdomen/pelvis 06/26/2017-soft tissue implants in the lower anterior peritoneum with implants at the paracolic gutters and left pelvic sidewall ? NegativeMYRIADhereditary cancer panel ? Elevated CA 125 ? CT biopsy of anterior omental mass 07/04/2017-Metastatic carcinoma consistent with a gynecologic primary ? 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 Cycle 1 adjuvant Taxol/carboplatin 08/05/2017 ? Cycle 2 adjuvant Taxol/carboplatin 08/26/2017 ? Cycle 3 adjuvant Taxol/carboplatin 09/17/2017-Taxol dose reduced secondary to neuropathy and bone pain ? Cycle 4 adjuvant Taxol/carboplatin 10/10/2017 ? Cycle 5 adjuvant Taxol/carboplatin 10/31/2017 ? Cycle 6 adjuvant Taxol/carboplatin 11/21/2017  2. Depression  3. Hypothyroid  4. Family history of uterine cancer-paternal grandmother    Disposition: Anna [name] remains in clinical remission from fallopian tube carcinoma.  We will follow-up on the CA 125 from today.  She will return for an office and lab visit in 4 months.  15 minutes were spent with the patient today.  The majority of the time was used for counseling and coordination of care. [name], MD  12/31/2018  11:04 AM

## 2018-12-31 NOTE — Telephone Encounter (Signed)
Scheduled appt per 02/13 los. ° °Printed calendar and avs. °

## 2019-01-01 ENCOUNTER — Telehealth: Payer: Self-pay | Admitting: *Deleted

## 2019-01-01 ENCOUNTER — Encounter: Payer: Self-pay | Admitting: Oncology

## 2019-01-01 LAB — CA 125: Cancer Antigen (CA) 125: 10.4 U/mL (ref 0.0–38.1)

## 2019-01-01 NOTE — Telephone Encounter (Signed)
-----   Message from Ladell Pier, MD sent at 01/01/2019  7:07 AM EST ----- Please call patient, ca125 is normal, not significantly changed, f/u as scheduled

## 2019-01-01 NOTE — Telephone Encounter (Signed)
Per Benay Spice, MD ca 125 is normal patient verbalized understanding. Reminded patient of next appointment on 05/07/2019- patient verbalized understanding, and appreciation of the call.

## 2019-01-05 ENCOUNTER — Telehealth: Payer: Self-pay | Admitting: *Deleted

## 2019-01-05 ENCOUNTER — Telehealth: Payer: Self-pay | Admitting: Oncology

## 2019-01-05 ENCOUNTER — Other Ambulatory Visit: Payer: Self-pay | Admitting: Internal Medicine

## 2019-01-05 DIAGNOSIS — Z1231 Encounter for screening mammogram for malignant neoplasm of breast: Secondary | ICD-10-CM

## 2019-01-05 DIAGNOSIS — C5702 Malignant neoplasm of left fallopian tube: Secondary | ICD-10-CM

## 2019-01-05 NOTE — Telephone Encounter (Signed)
Scheduled appt per 2/18 sch message - pt is aware of appt date and time   

## 2019-01-05 NOTE — Telephone Encounter (Signed)
Patient concerned with CA125 result increase from 7.8 to 10.4. Informed her this is not a significant increase and it is still in normal range. Best approach is to watch and wait for a consistent trend. Per NP, Ned Card will recheck in 2 months instead of in June. Patient agrees to this approach. Scheduling message sent.

## 2019-01-06 ENCOUNTER — Ambulatory Visit
Admission: RE | Admit: 2019-01-06 | Discharge: 2019-01-06 | Disposition: A | Discharge status: Home / Self Care | Payer: BLUE CROSS/BLUE SHIELD | Source: Ambulatory Visit

## 2019-01-06 DIAGNOSIS — Z1231 Encounter for screening mammogram for malignant neoplasm of breast: Secondary | ICD-10-CM

## 2019-01-25 ENCOUNTER — Other Ambulatory Visit: Payer: Self-pay | Admitting: Internal Medicine

## 2019-02-09 ENCOUNTER — Other Ambulatory Visit (HOSPITAL_COMMUNITY)
Admission: RE | Admit: 2019-02-09 | Discharge: 2019-02-09 | Disposition: A | Discharge status: Home / Self Care | Payer: BLUE CROSS/BLUE SHIELD | Source: Ambulatory Visit | Attending: Internal Medicine | Admitting: Internal Medicine

## 2019-02-09 ENCOUNTER — Ambulatory Visit (INDEPENDENT_AMBULATORY_CARE_PROVIDER_SITE_OTHER): Payer: BLUE CROSS/BLUE SHIELD | Admitting: Internal Medicine

## 2019-02-09 ENCOUNTER — Encounter: Payer: Self-pay | Admitting: Internal Medicine

## 2019-02-09 ENCOUNTER — Other Ambulatory Visit: Payer: Self-pay

## 2019-02-09 VITALS — BP 110/70 | HR 103 | Temp 98.0°F | Wt 182.4 lb

## 2019-02-09 DIAGNOSIS — F329 Major depressive disorder, single episode, unspecified: Secondary | ICD-10-CM

## 2019-02-09 DIAGNOSIS — Z124 Encounter for screening for malignant neoplasm of cervix: Secondary | ICD-10-CM | POA: Diagnosis not present

## 2019-02-09 DIAGNOSIS — C5702 Malignant neoplasm of left fallopian tube: Secondary | ICD-10-CM

## 2019-02-09 DIAGNOSIS — F32A Depression, unspecified: Secondary | ICD-10-CM

## 2019-02-09 DIAGNOSIS — Z8371 Family history of colonic polyps: Secondary | ICD-10-CM

## 2019-02-09 DIAGNOSIS — Z0000 Encounter for general adult medical examination without abnormal findings: Secondary | ICD-10-CM | POA: Diagnosis not present

## 2019-02-09 DIAGNOSIS — Z13220 Encounter for screening for lipoid disorders: Secondary | ICD-10-CM | POA: Diagnosis not present

## 2019-02-09 DIAGNOSIS — E7800 Pure hypercholesterolemia, unspecified: Secondary | ICD-10-CM

## 2019-02-09 DIAGNOSIS — Z83719 Family history of colon polyps, unspecified: Secondary | ICD-10-CM

## 2019-02-09 DIAGNOSIS — E039 Hypothyroidism, unspecified: Secondary | ICD-10-CM

## 2019-02-09 DIAGNOSIS — R1032 Left lower quadrant pain: Secondary | ICD-10-CM | POA: Diagnosis not present

## 2019-02-09 DIAGNOSIS — I10 Essential (primary) hypertension: Secondary | ICD-10-CM | POA: Diagnosis not present

## 2019-02-09 DIAGNOSIS — K219 Gastro-esophageal reflux disease without esophagitis: Secondary | ICD-10-CM

## 2019-02-09 LAB — CBC WITH DIFFERENTIAL/PLATELET
Basophils Absolute: 0 10*3/uL (ref 0.0–0.1)
Basophils Relative: 0.6 % (ref 0.0–3.0)
Eosinophils Absolute: 0.1 10*3/uL (ref 0.0–0.7)
Eosinophils Relative: 1.1 % (ref 0.0–5.0)
HCT: 37.4 % (ref 36.0–46.0)
HEMOGLOBIN: 12.5 g/dL (ref 12.0–15.0)
Lymphocytes Relative: 38.1 % (ref 12.0–46.0)
Lymphs Abs: 2.6 10*3/uL (ref 0.7–4.0)
MCHC: 33.6 g/dL (ref 30.0–36.0)
MCV: 95.2 fl (ref 78.0–100.0)
Monocytes Absolute: 0.7 10*3/uL (ref 0.1–1.0)
Monocytes Relative: 10.1 % (ref 3.0–12.0)
Neutro Abs: 3.4 10*3/uL (ref 1.4–7.7)
Neutrophils Relative %: 50.1 % (ref 43.0–77.0)
Platelets: 302 10*3/uL (ref 150.0–400.0)
RBC: 3.92 Mil/uL (ref 3.87–5.11)
RDW: 13.9 % (ref 11.5–15.5)
WBC: 6.7 10*3/uL (ref 4.0–10.5)

## 2019-02-09 LAB — HEPATIC FUNCTION PANEL
ALK PHOS: 86 U/L (ref 39–117)
ALT: 24 U/L (ref 0–35)
AST: 21 U/L (ref 0–37)
Albumin: 4.5 g/dL (ref 3.5–5.2)
Bilirubin, Direct: 0.1 mg/dL (ref 0.0–0.3)
Total Bilirubin: 0.4 mg/dL (ref 0.2–1.2)
Total Protein: 7.6 g/dL (ref 6.0–8.3)

## 2019-02-09 LAB — BASIC METABOLIC PANEL
BUN: 20 mg/dL (ref 6–23)
CO2: 26 mEq/L (ref 19–32)
Calcium: 9.8 mg/dL (ref 8.4–10.5)
Chloride: 102 mEq/L (ref 96–112)
Creatinine, Ser: 0.96 mg/dL (ref 0.40–1.20)
GFR: 58.87 mL/min — ABNORMAL LOW (ref >60.00)
Glucose, Bld: 98 mg/dL (ref 70–99)
POTASSIUM: 4.3 meq/L (ref 3.5–5.1)
Sodium: 136 mEq/L (ref 135–145)

## 2019-02-09 LAB — LIPID PANEL
Cholesterol: 160 mg/dL (ref 0–200)
HDL: 45.3 mg/dL (ref >39.00)
LDL Cholesterol: 82 mg/dL (ref 0–99)
NONHDL: 115.07
Total CHOL/HDL Ratio: 4
Triglycerides: 167 mg/dL — ABNORMAL HIGH (ref 0.0–149.0)
VLDL: 33.4 mg/dL (ref 0.0–40.0)

## 2019-02-09 MED ORDER — BUPROPION HCL ER (XL) 300 MG PO TB24: 300.0000 mg | ORAL_TABLET | Freq: Every day | ORAL | 1 refills | Status: DC

## 2019-02-09 MED ORDER — LISINOPRIL 20 MG PO TABS: 20.0000 mg | ORAL_TABLET | Freq: Every day | ORAL | 2 refills | Status: DC

## 2019-02-09 MED ORDER — ROSUVASTATIN CALCIUM 5 MG PO TABS: 5.0000 mg | ORAL_TABLET | Freq: Every day | ORAL | 1 refills | Status: DC

## 2019-02-09 NOTE — Progress Notes (Signed)
Patient ID: Anna [name], female   DOB: 03-15-1956, 63 y.o.   MRN: [id number]   Subjective:    Patient ID: Anna [name], female    DOB: 1956-07-23, 63 y.o.   MRN: [id number]  HPI  Patient here for her physical exam.  Was evaluated by Dr Benay Spice 12/31/18 for f/u of fallopian tube carcinoma.  Felt stable and recommended f/u in 4 months.  She had a f/u CA 125.  She is concerned because the level increased from previous check.  Is still in the normal range, but increased from last check.  Reports some anxiety related to this.  Reports that she has noticed some pain in her left lower quadrant.  Is intermittent. More discomfort with bowel movement.  States has noticed some bowel change.  Some occasional constipation alternating with loose stools.  No blood.  Eating.  No nausea or vomiting.  No fever.  No pain today.  Also notices some pain with her left hip.  This improves with walking and moving.  No chest pain.  No sob.  No acid reflux. Discussed the need for colonoscopy.  Due this year.     Past Medical History:  Diagnosis Date  . Anxiety   . Arthritis   . Cancer [facility])    gyenicological 2018  . Depression   . Diverticulosis   . Family history of adverse reaction to anesthesia    Mother has extreme naseau with anesthesia  . Family history of breast cancer   . Frequent headaches    H/O  . Genetic testing 11/07/2017  . GERD (gastroesophageal reflux disease)   . Heart murmur    Hx of  . History of chicken pox   . Hypercholesterolemia   . Hypertension   . Hypothyroidism   . Pneumonia   . Sleep apnea    No longer wears cpap   Past Surgical History:  Procedure Laterality Date  . ABDOMINAL HYSTERECTOMY     Dr. Denman George 07/17/17  . APPENDECTOMY  1981  . CARPAL TUNNEL RELEASE  2000  . [place]  . EYE SURGERY Bilateral 2008   Lasix eye srg  . LAPAROSCOPY N/A 07/17/2017   Procedure: LAPAROSCOPY DIAGNOSTIC;  Surgeon: Everitt Amber, MD;  Location: [facility];   Service: Gynecology;  Laterality: N/A;  . LAPAROTOMY N/A 07/17/2017   Procedure: EXPLORATORY LAPAROTOMY;  Surgeon: Everitt Amber, MD;  Location: [facility];  Service: Gynecology;  Laterality: N/A;  . OMENTECTOMY N/A 07/17/2017   Procedure: OMENTECTOMY WITH RADICAL TUMOR DEBULKING;  Surgeon: Everitt Amber, MD;  Location: [facility];  Service: Gynecology;  Laterality: N/A;  . TUBAL LIGATION  1985   Family History  Problem Relation Age of Onset  . Hyperlipidemia Mother   . Hypertension Mother   . Alzheimer's disease Mother   . Arthritis Father   . Hyperlipidemia Father   . Heart disease Father        first MI age 58  . Hypertension Father   . Hyperlipidemia Brother   . Heart disease Brother        heart disease dx at a youg age  . Hypertension Brother   . Alzheimer's disease Maternal Aunt   . Hyperlipidemia Maternal Uncle   . Heart disease Maternal Uncle   . Sudden death Maternal Uncle 42       massive heart attack in doctors office  . Arthritis Maternal Grandmother   . Hyperlipidemia Maternal Grandmother   . Stroke Maternal Grandmother   .  Hypertension Maternal Grandmother   . Alzheimer's disease Maternal Grandmother   . Alcohol abuse Maternal Grandfather   . Hypertension Maternal Grandfather   . Diabetes Maternal Grandfather   . Cancer Paternal Grandmother        Ovarian  . Hypertension Paternal Grandmother   . Alcohol abuse Paternal Grandfather   . Hyperlipidemia Paternal Grandfather   . Heart disease Paternal Grandfather   . Hypertension Paternal Grandfather   . Breast cancer Cousin        pat cousin   Social History   Socioeconomic History  . Marital status: Married    Spouse name: Not on file  . Number of children: 2  . Years of education: Not on file  . Highest education level: Not on file  Occupational History  . Not on file  Social Needs  . Financial resource strain: Not on file  . Food insecurity:    Worry: Not on file    Inability: Not on file  . Transportation  needs:    Medical: Not on file    Non-medical: Not on file  Tobacco Use  . Smoking status: Never Smoker  . Smokeless tobacco: Never Used  Substance and Sexual Activity  . Alcohol use: No    Alcohol/week: 0.0 standard drinks  . Drug use: No  . Sexual activity: Yes  Lifestyle  . Physical activity:    Days per week: Not on file    Minutes per session: Not on file  . Stress: Not on file  Relationships  . Social connections:    Talks on phone: Not on file    Gets together: Not on file    Attends religious service: Not on file    Active member of club or organization: Not on file    Attends meetings of clubs or organizations: Not on file    Relationship status: Not on file  Other Topics Concern  . Not on file  Social History Narrative  . Not on file    Outpatient Encounter Medications as of 02/09/2019  Medication Sig  . acyclovir (ZOVIRAX) 400 MG tablet TAKE 1 TABLET THREE TIMES A DAY FOR 5 DAYS AS NEEDED FOR FLARES  . acyclovir cream (ZOVIRAX) 5 % Use as directed.  Marland Kitchen buPROPion (WELLBUTRIN XL) 300 MG 24 hr tablet Take 1 tablet (300 mg total) by mouth daily.  Marland Kitchen EPINEPHrine 0.3 mg/0.3 mL IJ SOAJ injection Inject 0.3 mLs (0.3 mg total) into the muscle as needed (for anaphylaxis).  Marland Kitchen ibuprofen (ADVIL,MOTRIN) 200 MG tablet Take 200 mg by mouth every 6 (six) hours as needed.  Marland Kitchen levothyroxine (SYNTHROID, LEVOTHROID) 88 MCG tablet TAKE 1 TABLET BY MOUTH  DAILY BEFORE BREAKFAST  . Multiple Vitamin (MULTIVITAMIN WITH MINERALS) TABS tablet Take 1 tablet by mouth daily.  Marland Kitchen omeprazole (PRILOSEC) 20 MG capsule TAKE 1 CAPSULE TWICE A DAY BEFORE MEALS  . rosuvastatin (CRESTOR) 5 MG tablet Take 1 tablet (5 mg total) by mouth daily.  . [DISCONTINUED] buPROPion (WELLBUTRIN XL) 300 MG 24 hr tablet TAKE 1 TABLET BY MOUTH ONCE DAILY  . [DISCONTINUED] lisinopril-hydrochlorothiazide (ZESTORETIC) 20-12.5 MG tablet Take 1 tablet by mouth daily.  . [DISCONTINUED] rosuvastatin (CRESTOR) 5 MG tablet Take 1  tablet (5 mg total) by mouth daily.  Marland Kitchen lisinopril (PRINIVIL,ZESTRIL) 20 MG tablet Take 1 tablet (20 mg total) by mouth daily.   No facility-administered encounter medications on file as of 02/09/2019.     Review of Systems  Constitutional: Negative for appetite change, fatigue and unexpected  weight change.  HENT: Negative for congestion and sinus pressure.   Eyes: Negative for pain and visual disturbance.  Respiratory: Negative for cough, chest tightness and shortness of breath.   Cardiovascular: Negative for chest pain, palpitations and leg swelling.  Gastrointestinal: Negative for nausea and vomiting.       Left lower quadrant pain as outlined.  Intermittent.  Some alternating constipation and loose stool.    Genitourinary: Negative for difficulty urinating and dysuria.  Musculoskeletal: Negative for joint swelling and myalgias.       Left hip pain as outlined.    Skin: Negative for color change and rash.  Neurological: Negative for dizziness, light-headedness and headaches.  Hematological: Negative for adenopathy. Does not bruise/bleed easily.  Psychiatric/Behavioral: Negative for agitation and dysphoric mood.       Objective:    Physical Exam Constitutional:      General: She is not in acute distress.    Appearance: Normal appearance. She is well-developed.  HENT:     Nose: Nose normal. No congestion.     Mouth/Throat:     Pharynx: No oropharyngeal exudate or posterior oropharyngeal erythema.  Eyes:     General: No scleral icterus.       Right eye: No discharge.        Left eye: No discharge.  Neck:     Musculoskeletal: Neck supple. No muscular tenderness.     Thyroid: No thyromegaly.  Cardiovascular:     Rate and Rhythm: Normal rate and regular rhythm.  Pulmonary:     Effort: No tachypnea, accessory muscle usage or respiratory distress.     Breath sounds: Normal breath sounds. No decreased breath sounds or wheezing.  Chest:     Breasts:        Right: No inverted  nipple, mass, nipple discharge or tenderness (no axillary adenopathy).        Left: No inverted nipple, mass, nipple discharge or tenderness (no axilarry adenopathy).  Abdominal:     General: Bowel sounds are normal.     Palpations: Abdomen is soft.     Tenderness: There is no abdominal tenderness.  Genitourinary:    Comments: Normal external genitalia.  Vaginal vault without lesions. S/p hysterectomy.   Pap smear performed.  Could not appreciate any adnexal masses or tenderness.   Musculoskeletal:        General: No swelling or tenderness.     Comments: No pain with abduction and adduction - left hip.  No pain with SLR.   Lymphadenopathy:     Cervical: No cervical adenopathy.  Skin:    Findings: No erythema or rash.  Neurological:     Mental Status: She is alert and oriented to person, place, and time.  Psychiatric:        Mood and Affect: Mood normal.        Behavior: Behavior normal.     BP 110/70   Pulse (!) 103   Temp 98 F (36.7 C) (Oral)   Wt 182 lb 6.4 oz (82.7 kg)   SpO2 98%   BMI 32.31 kg/m  Wt Readings from Last 3 Encounters:  02/09/19 182 lb 6.4 oz (82.7 kg)  12/31/18 186 lb (84.4 kg)  09/02/18 182 lb 11.2 oz (82.9 kg)     Lab Results  Component Value Date   WBC 6.7 02/09/2019   HGB 12.5 02/09/2019   HCT 37.4 02/09/2019   PLT 302.0 02/09/2019   GLUCOSE 98 02/09/2019   CHOL 160 02/09/2019   TRIG  167.0 (H) 02/09/2019   HDL 45.30 02/09/2019   LDLDIRECT 143.0 05/23/2016   LDLCALC 82 02/09/2019   ALT 24 02/09/2019   AST 21 02/09/2019   NA 136 02/09/2019   K 4.3 02/09/2019   CL 102 02/09/2019   CREATININE 0.96 02/09/2019   BUN 20 02/09/2019   CO2 26 02/09/2019   TSH 2.91 06/23/2018   INR 0.93 07/04/2017    Mm 3d Screen Breast Bilateral  Result Date: 01/07/2019 CLINICAL DATA:  Screening. EXAM: DIGITAL SCREENING BILATERAL MAMMOGRAM WITH TOMO AND CAD COMPARISON:  Previous exam(s). ACR Breast Density Category b: There are scattered areas of  fibroglandular density. FINDINGS: There are no findings suspicious for malignancy. Images were processed with CAD. IMPRESSION: No mammographic evidence of malignancy. A result letter of this screening mammogram will be mailed directly to the patient. RECOMMENDATION: Screening mammogram in one year. (Code:SM-B-01Y) BI-RADS CATEGORY  1: Negative. Electronically Signed   By: Everlean Alstrom M.D.   On: 01/07/2019 13:55       Assessment & Plan:   Problem List Items Addressed This Visit    Abdominal pain    Left lower quadrant pain as outlined - intermittent.  Bowels - alternating loose stool and constipation.  No pain on exam today.  Check routine labs, including cbc and liver panel.  Due for colonoscopy.  Refer to GI.  D/w Dr Benay Spice regarding pts concern regarding pain and CA 125 level.       Relevant Orders   CBC with Differential/Platelet (Completed)   Hepatic function panel (Completed)   Ambulatory referral to Gastroenterology   Depression    Overall doing well on wellbutrin.  Follow.        Relevant Medications   buPROPion (WELLBUTRIN XL) 300 MG 24 hr tablet   Essential hypertension, benign    Blood pressure 100/68 on my check.  Occasionally will notice a little light headedness with standing.  Will change lisinopril/hctz to lisinopril 20mg  q day.  Follow pressures.  Follow metabolic panel.  Get her back in soon to reassess blood pressure.        Relevant Medications   lisinopril (PRINIVIL,ZESTRIL) 20 MG tablet   rosuvastatin (CRESTOR) 5 MG tablet   Other Relevant Orders   Basic metabolic panel (Completed)   Family history of colonic polyps    Colonoscopy 03/2014 - normal.  Recommended f/u colonoscopy in 5 years.  Due.  Prefers to see Dr Carlean Purl.       Relevant Orders   Ambulatory referral to Gastroenterology   GERD (gastroesophageal reflux disease)    No acid reflux reported.        Health care maintenance    Physical today 02/09/19.  PAP 02/09/19.  Mammogram 01/07/19 -  Birads I.  Last colonoscopy 03/2014.  Recommended f/u colonoscopy in 2020.        Hypercholesterolemia    On crestor.  Low cholesterol diet and exercise.  Follow lipid panel and liver function tests.        Relevant Medications   lisinopril (PRINIVIL,ZESTRIL) 20 MG tablet   rosuvastatin (CRESTOR) 5 MG tablet   Hypothyroidism    On thyroid replacement.  Follow tsh.        Malignant neoplasm of fallopian tube ([place])    Followed by oncology.  Recent CA 125 -  10.  Discussed wnl.  She is concerned regarding left lower quadrant pain.  Some bowel change as outlined.  Check routine labs.  Refer for colonoscopy - due.  D/w Dr Benay Spice.  Other Visit Diagnoses    Routine general medical examination at a health care facility    -  Primary   Screening cholesterol level       Relevant Orders   Lipid panel (Completed)   Cervical cancer screening       Relevant Orders   Cytology - PAP( [place])       Einar Pheasant, MD

## 2019-02-09 NOTE — Assessment & Plan Note (Addendum)
Colonoscopy 03/2014 - normal.  Recommended f/u colonoscopy in 5 years.  Due.  Prefers to see Dr Carlean Purl.

## 2019-02-09 NOTE — Assessment & Plan Note (Addendum)
Physical today 02/09/19.  PAP 02/09/19.  Mammogram 01/07/19 - Birads I.  Last colonoscopy 03/2014.  Recommended f/u colonoscopy in 2020.

## 2019-02-10 ENCOUNTER — Encounter: Payer: Self-pay | Admitting: Internal Medicine

## 2019-02-10 ENCOUNTER — Telehealth: Payer: Self-pay | Admitting: Internal Medicine

## 2019-02-10 LAB — CYTOLOGY - PAP
Diagnosis: NEGATIVE
HPV: NOT DETECTED

## 2019-02-10 NOTE — Assessment & Plan Note (Signed)
Left lower quadrant pain as outlined - intermittent.  Bowels - alternating loose stool and constipation.  No pain on exam today.  Check routine labs, including cbc and liver panel.  Due for colonoscopy.  Refer to GI.  D/w Dr Benay Spice regarding pts concern regarding pain and CA 125 level.

## 2019-02-10 NOTE — Assessment & Plan Note (Signed)
No acid reflux reported.   

## 2019-02-10 NOTE — Assessment & Plan Note (Signed)
On crestor.  Low cholesterol diet and exercise.  Follow lipid panel and liver function tests.   

## 2019-02-10 NOTE — Assessment & Plan Note (Signed)
Followed by oncology.  Recent CA 125 -  10.  Discussed wnl.  She is concerned regarding left lower quadrant pain.  Some bowel change as outlined.  Check routine labs.  Refer for colonoscopy - due.  D/w Dr Benay Spice.

## 2019-02-10 NOTE — Telephone Encounter (Signed)
-----   Message from Ladell Pier, MD sent at 02/10/2019  3:36 PM EDT ----- Regarding: RE: follow up I would not do anything different now.  If pain persists I would repeat the Ca125 and consider an abdomen/pelvis CT.  She is scheduled for a Ca125 here next month and an office visit in June  I will be glad to see her sooner Just let me know how she is doing when you see her back  Thanks,  Julieanne Manson  ----- Message ----- From: Einar Pheasant, MD Sent: 02/10/2019  12:54 AM EDT To: Ladell Pier, MD Subject: follow up                                      I saw Ms Anna [name] for a routine physical exam.  She reported having some left lower quadrant pain with bowel changes.  She is concerned because the pain is similar to the pain she was experiencing prior to her cancer diagnosis.  She also informed me she was concerned regarding her CA 125 level.  I ordered some routine labs and referred her to GI because she is due for a f/u colonoscopy given her family history.  (last colonoscopy 2015).  I had informed her that I would forward this to you.  Let me know if there is anything more you would like for me to do.  I will see her back soon.    Thank you for your help.   Einar Pheasant

## 2019-02-10 NOTE — Telephone Encounter (Signed)
My chart message sent to pt for update.   

## 2019-02-10 NOTE — Assessment & Plan Note (Signed)
Blood pressure 100/68 on my check.  Occasionally will notice a little light headedness with standing.  Will change lisinopril/hctz to lisinopril 20mg  q day.  Follow pressures.  Follow metabolic panel.  Get her back in soon to reassess blood pressure.

## 2019-02-10 NOTE — Assessment & Plan Note (Signed)
On thyroid replacement.  Follow tsh.  

## 2019-02-10 NOTE — Assessment & Plan Note (Signed)
Overall doing well on wellbutrin.  Follow.

## 2019-03-10 ENCOUNTER — Other Ambulatory Visit: Payer: Self-pay

## 2019-03-10 ENCOUNTER — Inpatient Hospital Stay: Payer: BLUE CROSS/BLUE SHIELD | Attending: Oncology

## 2019-03-10 DIAGNOSIS — C5702 Malignant neoplasm of left fallopian tube: Secondary | ICD-10-CM | POA: Diagnosis not present

## 2019-03-11 LAB — CA 125: Cancer Antigen (CA) 125: 12.2 U/mL (ref 0.0–38.1)

## 2019-03-16 ENCOUNTER — Encounter: Payer: Self-pay | Admitting: Oncology

## 2019-03-16 ENCOUNTER — Encounter: Payer: Self-pay | Admitting: General Surgery

## 2019-03-16 ENCOUNTER — Encounter: Payer: Self-pay | Admitting: Internal Medicine

## 2019-03-17 ENCOUNTER — Ambulatory Visit (INDEPENDENT_AMBULATORY_CARE_PROVIDER_SITE_OTHER): Payer: BLUE CROSS/BLUE SHIELD | Admitting: Internal Medicine

## 2019-03-17 ENCOUNTER — Other Ambulatory Visit: Payer: Self-pay

## 2019-03-17 ENCOUNTER — Encounter: Payer: Self-pay | Admitting: Internal Medicine

## 2019-03-17 VITALS — Ht 64.0 in | Wt 178.0 lb

## 2019-03-17 DIAGNOSIS — Z8371 Family history of colonic polyps: Secondary | ICD-10-CM | POA: Diagnosis not present

## 2019-03-17 DIAGNOSIS — R194 Change in bowel habit: Secondary | ICD-10-CM

## 2019-03-17 DIAGNOSIS — K5900 Constipation, unspecified: Secondary | ICD-10-CM | POA: Diagnosis not present

## 2019-03-17 DIAGNOSIS — Z8543 Personal history of malignant neoplasm of ovary: Secondary | ICD-10-CM

## 2019-03-17 NOTE — Telephone Encounter (Signed)
I have scheduled patient for a virtual visit tomorrow.

## 2019-03-17 NOTE — Progress Notes (Signed)
TELEHEALTH ENCOUNTER IN SETTING OF COVID-19 PANDEMIC - REQUESTED BY PATIENT SERVICE PROVIDED BY TELEMEDECINE - TYPE: Zoom AV PATIENT LOCATION: Home PATIENT HAS CONSENTED TO TELEHEALTH VISIT PROVIDER LOCATION: OFFICE REFERRING PROVIDER:Charlene Scott, MD PARTICIPANTS OTHER THAN PATIENT:None TIME SPENT ON CALL:20 minutes    Anna [name] 63 y.o. [birth year] [id number]  Assessment & Plan:   Encounter Diagnoses  Name Primary?  . Change in bowel habits Yes  . Personal history of ovarian cancer - left fallopian tube   . Family history of colonic polyps - brothers     Given her overall clinical situation with change in bowel habits in the setting of prior left fallopian tube cancer I think having a colonoscopy makes sense.  The family history of colon polyps itself do not merit more frequent colonoscopy than routine screening, as best I know in her case.  She has had a negative myriad hereditary cancer panel.  Since she is tending towards constipation despite her stool softener I will recommend a tablespoon of Benefiber daily.   We will schedule her for a colonoscopy, I have explained the typical risks of bleeding infection reactions to medication damage and need for surgery as well as the chance of contracting COVID-19 through exposure in a healthcare facility as there are asymptomatic carriers.  Today we do not have any known COVID-19 staff or patients in our facility, but we can never tell.  She understands and accepts that risk.  I plan to schedule this in the next few weeks.  She knows to contact me if things deteriorate.  I appreciate the opportunity to care for this patient. .CC: Einar Pheasant, MD   Subjective:   Chief Complaint: Change in bowel habits with constipation  HPI The patient is a 63 year old white woman who has had a several week history of new constipation followed by some days of multiple loose stools.  She has a personal history of left fallopian  tube cancer status post surgery and then chemotherapy starting in 2018.  Since that time she can tell that stools are passing through her left colon though these other symptoms described above are relatively new.  No bleeding.  No overt pain.  She had a negative colonoscopy by Dr. Verdie Shire in 2015.  Wt Readings from Last 3 Encounters:  03/17/19 178 lb (80.7 kg)  03/16/19 178 lb (80.7 kg)  02/09/19 182 lb 6.4 oz (82.7 kg)   To her knowledge in my knowledge from chart review she remains in remission from her fallopian tube cancer she is followed by Dr. Julieanne Manson.  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  And then Taxol carboplatin therapy.  Social history notable for that she is married to Marietta "Gershon Mussel" Jessie who is also a patient of mine.He has a history of cecal cancer dx 2016 and post-op diarrhea. Allergies  Allergen Reactions  . Bee Venom Anaphylaxis  . Other Anaphylaxis    Fire ants    Current Meds  Medication Sig  . acyclovir (ZOVIRAX) 400 MG tablet TAKE 1 TABLET THREE TIMES A DAY FOR 5 DAYS AS NEEDED FOR FLARES  . acyclovir cream (ZOVIRAX) 5 % Use as directed.  Marland Kitchen buPROPion (WELLBUTRIN XL) 300 MG 24 hr tablet Take 1 tablet (300 mg total) by mouth daily.  Marland Kitchen EPINEPHrine 0.3 mg/0.3 mL IJ SOAJ injection Inject 0.3 mLs (0.3 mg total) into  the muscle as needed (for anaphylaxis).  Marland Kitchen ibuprofen (ADVIL,MOTRIN) 200 MG tablet Take 200 mg by mouth every 6 (six) hours as needed.  Marland Kitchen levothyroxine (SYNTHROID, LEVOTHROID) 88 MCG tablet TAKE 1 TABLET BY MOUTH  DAILY BEFORE BREAKFAST  . lisinopril (PRINIVIL,ZESTRIL) 20 MG tablet Take 1 tablet (20 mg total) by mouth daily.  . Multiple Vitamin (MULTIVITAMIN WITH MINERALS) TABS tablet Take 1 tablet by mouth daily.  Marland Kitchen omeprazole (PRILOSEC) 20 MG capsule TAKE 1 CAPSULE TWICE A DAY BEFORE MEALS  . rosuvastatin  (CRESTOR) 5 MG tablet Take 1 tablet (5 mg total) by mouth daily.   Past Medical History:  Diagnosis Date  . Anxiety   . Arthritis   . Depression   . Diverticulosis   . Fallopian tube carcinoma, left ([place]) 2018  . Family history of adverse reaction to anesthesia    Mother has extreme n[place]au with anesthesia  . Family history of breast cancer   . Frequent headaches    H/O  . Genetic testing 11/07/2017  . GERD (gastroesophageal reflux disease)   . Heart murmur    Hx of  . History of chicken pox   . Hypercholesterolemia   . Hypertension   . Hypothyroidism   . Pneumonia   . Sleep apnea    No longer wears cpap   Past Surgical History:  Procedure Laterality Date  . ABDOMINAL HYSTERECTOMY     Dr. Denman George 07/17/17  . APPENDECTOMY  1981  . CARPAL TUNNEL RELEASE  2000  . [place]  . EYE SURGERY Bilateral 2008   Lasix eye srg  . LAPAROSCOPY N/A 07/17/2017   Procedure: LAPAROSCOPY DIAGNOSTIC;  Surgeon: Everitt Amber, MD;  Location: [facility];  Service: Gynecology;  Laterality: N/A;  . LAPAROTOMY N/A 07/17/2017   Procedure: EXPLORATORY LAPAROTOMY;  Surgeon: Everitt Amber, MD;  Location: [facility];  Service: Gynecology;  Laterality: N/A;  . OMENTECTOMY N/A 07/17/2017   Procedure: OMENTECTOMY WITH RADICAL TUMOR DEBULKING;  Surgeon: Everitt Amber, MD;  Location: [facility];  Service: Gynecology;  Laterality: N/A;  . TUBAL LIGATION  1985   Social History   Social History Narrative   Married to [employer]   2 children   No alcohol tobacco or drug use   family history includes Alcohol abuse in her maternal grandfather and paternal grandfather; Alzheimer's disease in her maternal aunt, maternal grandmother, and mother; Arthritis in her father and maternal grandmother; Breast cancer in her cousin; Cancer in her paternal grandmother; Diabetes in her maternal grandfather; Heart disease in her brother, father, maternal uncle, and paternal grandfather; Hyperlipidemia in her brother, father, maternal  grandmother, maternal uncle, mother, and paternal grandfather; Hypertension in her brother, father, maternal grandfather, maternal grandmother, mother, paternal grandfather, and paternal grandmother; Stroke in her maternal grandmother; Sudden death (age of onset: 74) in her maternal uncle.   Review of Systems As per HPI.  All their appear negative  I have reviewed primary care notes and oncology notes from 2019 through 2020.  Labs in the EMR as well.  Normal CBC and CEA 125 March and February 2020 respectively

## 2019-03-17 NOTE — Patient Instructions (Signed)
It was good to see you by Zoom today.  Sorry you are experiencing these problems.  As we discussed, my recommendations are as follows:  #1 take 1 tablespoon of Benefiber daily and make sure that you have plenty of fluids at least 4 to 6 glasses of water or similar liquid daily.  The beauty of water is it does not have any calories.  #2 we will schedule a colonoscopy to evaluate your problems.  My staff will sort this out with you.  I appreciate the opportunity to care for you. Gatha Mayer, MD, [facility]

## 2019-03-18 ENCOUNTER — Ambulatory Visit (INDEPENDENT_AMBULATORY_CARE_PROVIDER_SITE_OTHER): Payer: BLUE CROSS/BLUE SHIELD | Admitting: Internal Medicine

## 2019-03-18 ENCOUNTER — Other Ambulatory Visit: Payer: Self-pay

## 2019-03-18 ENCOUNTER — Encounter: Payer: Self-pay | Admitting: Internal Medicine

## 2019-03-18 DIAGNOSIS — S0096XA Insect bite (nonvenomous) of unspecified part of head, initial encounter: Secondary | ICD-10-CM

## 2019-03-18 DIAGNOSIS — W57XXXA Bitten or stung by nonvenomous insect and other nonvenomous arthropods, initial encounter: Secondary | ICD-10-CM

## 2019-03-18 DIAGNOSIS — K219 Gastro-esophageal reflux disease without esophagitis: Secondary | ICD-10-CM | POA: Diagnosis not present

## 2019-03-18 DIAGNOSIS — E039 Hypothyroidism, unspecified: Secondary | ICD-10-CM | POA: Diagnosis not present

## 2019-03-18 DIAGNOSIS — I10 Essential (primary) hypertension: Secondary | ICD-10-CM

## 2019-03-18 DIAGNOSIS — E7800 Pure hypercholesterolemia, unspecified: Secondary | ICD-10-CM

## 2019-03-18 DIAGNOSIS — R599 Enlarged lymph nodes, unspecified: Secondary | ICD-10-CM

## 2019-03-18 DIAGNOSIS — J329 Chronic sinusitis, unspecified: Secondary | ICD-10-CM

## 2019-03-18 MED ORDER — DOXYCYCLINE HYCLATE 100 MG PO TABS: 100.0000 mg | ORAL_TABLET | Freq: Two times a day (BID) | ORAL | 0 refills | Status: DC

## 2019-03-18 MED ORDER — LISINOPRIL 20 MG PO TABS: 20.0000 mg | ORAL_TABLET | Freq: Every day | ORAL | 1 refills | Status: DC

## 2019-03-18 NOTE — Progress Notes (Addendum)
Patient ID: Anna [name], female   DOB: [birth year], 63 y.o.   MRN: [id number] Virtual Visit via Video Note  This visit type was conducted due to national recommendations for restrictions regarding the COVID-19 pandemic (e.g. social distancing).  This format is felt to be most appropriate for this patient at this time.  All issues noted in this document were discussed and addressed.  No physical exam was performed (except for noted visual exam findings with Video Visits).   I connected with Anna [name] by a video enabled telemedicine application and verified that I am speaking with the correct person using two identifiers. Location patient: home Location provider: work Persons participating in the virtual visit: patient, provider  I discussed the limitations, risks, security and privacy concerns of performing an evaluation and management service by video and the availability of in person appointments. The patient expressed understanding and agreed to proceed.   Reason for visit: acute visit.    HPI: States that Easter am, she found a tick - crown (head).  Small.  Attached.  Removed intact with alcohol.  Had some headache and nausea last week.  No headache now.  Did develop some right ear pain.  Attributed to allergies.  She then noticed a knot/raised area base of skull left side.  Was sore to touch.  Felt was possibly a bite. No tick found at this site.  She then noticed (2 days ago) a lymph node - right - above clavicle.  Pea sized.  Sore to touch.  Is having sinus congestion and sinus pressure.  No sore throat. No fever.  Some intermittent nausea and headaches, but denies this now.  No body aches or rash.  Eating.  No vomiting.  No abdominal pain.  Bowels moving.  No known COVID exposure.  No chest congestion, cough or sob.  She did have a visit with GI yesterday.  Planning for colonoscopy.  Recommended benefiber.  On wellbutrin.  Appears to be doing well on this medication.  Last visit,  we changed her lisinopril/hctz to just lisinopril 20mg  q day.  She is doing well with this medication.  Blood pressure is doing well.     ROS: See pertinent positives and negatives per HPI.  Past Medical History:  Diagnosis Date  . Anxiety   . Arthritis   . Depression   . Diverticulosis   . Fallopian tube carcinoma, left ([place]) 2018  . Family history of adverse reaction to anesthesia    Mother has extreme [place] with anesthesia  . Family history of breast cancer   . Frequent headaches    H/O  . Genetic testing 11/07/2017  . GERD (gastroesophageal reflux disease)   . Heart murmur    Hx of  . History of chicken pox   . Hypercholesterolemia   . Hypertension   . Hypothyroidism   . Pneumonia   . Sleep apnea    No longer wears cpap    Past Surgical History:  Procedure Laterality Date  . ABDOMINAL HYSTERECTOMY     Dr. Denman George 07/17/17  . APPENDECTOMY  1981  . CARPAL TUNNEL RELEASE  2000  . [place]  . EYE SURGERY Bilateral 2008   Lasix eye srg  . LAPAROSCOPY N/A 07/17/2017   Procedure: LAPAROSCOPY DIAGNOSTIC;  Surgeon: Everitt Amber, MD;  Location: [facility];  Service: Gynecology;  Laterality: N/A;  . LAPAROTOMY N/A 07/17/2017   Procedure: EXPLORATORY LAPAROTOMY;  Surgeon: Everitt Amber, MD;  Location: [facility];  Service:  Gynecology;  Laterality: N/A;  . OMENTECTOMY N/A 07/17/2017   Procedure: OMENTECTOMY WITH RADICAL TUMOR DEBULKING;  Surgeon: Everitt Amber, MD;  Location: [facility];  Service: Gynecology;  Laterality: N/A;  . TUBAL LIGATION  1985    Family History  Problem Relation Age of Onset  . Hyperlipidemia Mother   . Hypertension Mother   . Alzheimer's disease Mother   . Arthritis Father   . Hyperlipidemia Father   . Heart disease Father        first MI age 62  . Hypertension Father   . Hyperlipidemia Brother   . Heart disease Brother        heart disease dx at a youg age  . Hypertension Brother   . Alzheimer's disease Maternal Aunt   . Hyperlipidemia  Maternal Uncle   . Heart disease Maternal Uncle   . Sudden death Maternal Uncle 42       massive heart attack in doctors office  . Arthritis Maternal Grandmother   . Hyperlipidemia Maternal Grandmother   . Stroke Maternal Grandmother   . Hypertension Maternal Grandmother   . Alzheimer's disease Maternal Grandmother   . Alcohol abuse Maternal Grandfather   . Hypertension Maternal Grandfather   . Diabetes Maternal Grandfather   . Cancer Paternal Grandmother        Ovarian  . Hypertension Paternal Grandmother   . Alcohol abuse Paternal Grandfather   . Hyperlipidemia Paternal Grandfather   . Heart disease Paternal Grandfather   . Hypertension Paternal Grandfather   . Breast cancer Cousin        pat cousin    SOCIAL HX: reviewed.    Current Outpatient Medications:  .  acyclovir (ZOVIRAX) 400 MG tablet, TAKE 1 TABLET THREE TIMES A DAY FOR 5 DAYS AS NEEDED FOR FLARES, Disp: 90 tablet, Rfl: 0 .  acyclovir cream (ZOVIRAX) 5 %, Use as directed., Disp: 15 g, Rfl: 0 .  buPROPion (WELLBUTRIN XL) 300 MG 24 hr tablet, Take 1 tablet (300 mg total) by mouth daily., Disp: 90 tablet, Rfl: 1 .  doxycycline (VIBRA-TABS) 100 MG tablet, Take 1 tablet (100 mg total) by mouth 2 (two) times daily., Disp: 20 tablet, Rfl: 0 .  EPINEPHrine 0.3 mg/0.3 mL IJ SOAJ injection, Inject 0.3 mLs (0.3 mg total) into the muscle as needed (for anaphylaxis)., Disp: 3 Device, Rfl: 1 .  ibuprofen (ADVIL,MOTRIN) 200 MG tablet, Take 200 mg by mouth every 6 (six) hours as needed., Disp: , Rfl:  .  levothyroxine (SYNTHROID, LEVOTHROID) 88 MCG tablet, TAKE 1 TABLET BY MOUTH  DAILY BEFORE BREAKFAST, Disp: 90 tablet, Rfl: 0 .  lisinopril (ZESTRIL) 20 MG tablet, Take 1 tablet (20 mg total) by mouth daily., Disp: 90 tablet, Rfl: 1 .  Multiple Vitamin (MULTIVITAMIN WITH MINERALS) TABS tablet, Take 1 tablet by mouth daily., Disp: , Rfl:  .  omeprazole (PRILOSEC) 20 MG capsule, TAKE 1 CAPSULE TWICE A DAY BEFORE MEALS, Disp: 180  capsule, Rfl: 1 .  rosuvastatin (CRESTOR) 5 MG tablet, Take 1 tablet (5 mg total) by mouth daily., Disp: 90 tablet, Rfl: 1  EXAM:  VITALS:  Blood pressure 126/80  GENERAL: alert, oriented, appears well and in no acute distress  HEENT: atraumatic, conjunttiva clear, no obvious abnormalities on inspection of external nose and ears  NECK: normal movements of the head and neck  LUNGS: on inspection no signs of respiratory distress, breathing rate appears normal, no obvious gross SOB, gasping or wheezing  CV: no obvious cyanosis  PSYCH/NEURO: pleasant  and cooperative, no obvious depression or anxiety, speech and thought processing grossly intact  ASSESSMENT AND PLAN:  Discussed the following assessment and plan:  Essential hypertension, benign  Gastroesophageal reflux disease, esophagitis presence not specified  Hypercholesterolemia  Hypothyroidism, unspecified type  Sinusitis, unspecified chronicity, unspecified location  Enlarged lymph node  Tick bite of head, initial encounter  Essential hypertension, benign Blood pressure doing well on lisinopril 20mg  q day.  Same medication.  Follow pressures.  Follow metabolic panel.    GERD (gastroesophageal reflux disease) No acid reflux reported.    Hypercholesterolemia On crestor.  Follow lipid panel and liver function tests.    Hypothyroidism On thyroid replacement.  Follow tsh.   Sinusitis Treat with doxycycline as outlined.  Saline nasal spray and mucinex/robitussin as directed.  Follow.  Take probiotics as directed.    Enlarged lymph node Per pt, enlarged lymph node - supraclavicular - right.  She has been having increased sinus and ear issues.  Recent tick bite.  No headache now.  Sore to touch.  Appears to be more a reactive lymph node.  Discussed with her regarding treatment options. Treat for sinus and ear infection with doxycycline as outlined.  Follow lymph node.  Call with update over the next week.    Tick bite  of head Tick bite as outlined.  No headache now. No fever. No body aches.  Being treated for sinus infection with doxycycline.  Follow.  Notify me if problems.      I discussed the assessment and treatment plan with the patient. The patient was provided an opportunity to ask questions and all were answered. The patient agreed with the plan and demonstrated an understanding of the instructions.   The patient was advised to call back or seek an in-person evaluation if the symptoms worsen or if the condition fails to improve as anticipated.    Addendum:  Due to video issues, this visit had to be converted to a telephone visit.  She agreed to complete the visit via audio only. I spent 25 minutes of non face to face time with this patient during this encounter.      Einar Pheasant, MD

## 2019-03-21 ENCOUNTER — Encounter: Payer: Self-pay | Admitting: Internal Medicine

## 2019-03-21 DIAGNOSIS — J329 Chronic sinusitis, unspecified: Secondary | ICD-10-CM | POA: Insufficient documentation

## 2019-03-21 DIAGNOSIS — R599 Enlarged lymph nodes, unspecified: Secondary | ICD-10-CM | POA: Insufficient documentation

## 2019-03-21 DIAGNOSIS — W57XXXA Bitten or stung by nonvenomous insect and other nonvenomous arthropods, initial encounter: Secondary | ICD-10-CM

## 2019-03-21 DIAGNOSIS — S0096XA Insect bite (nonvenomous) of unspecified part of head, initial encounter: Secondary | ICD-10-CM | POA: Insufficient documentation

## 2019-03-21 NOTE — Assessment & Plan Note (Signed)
Per pt, enlarged lymph node - supraclavicular - right.  She has been having increased sinus and ear issues.  Recent tick bite.  No headache now.  Sore to touch.  Appears to be more a reactive lymph node.  Discussed with her regarding treatment options. Treat for sinus and ear infection with doxycycline as outlined.  Follow lymph node.  Call with update over the next week.

## 2019-03-21 NOTE — Assessment & Plan Note (Signed)
On crestor.  Follow lipid panel and liver function tests.   

## 2019-03-21 NOTE — Assessment & Plan Note (Signed)
On thyroid replacement.  Follow tsh.  

## 2019-03-21 NOTE — Assessment & Plan Note (Signed)
Blood pressure doing well on lisinopril 20mg  q day.  Same medication.  Follow pressures.  Follow metabolic panel.

## 2019-03-21 NOTE — Assessment & Plan Note (Addendum)
Treat with doxycycline as outlined.  Saline nasal spray and mucinex/robitussin as directed.  Follow.  Take probiotics as directed.

## 2019-03-21 NOTE — Assessment & Plan Note (Signed)
No acid reflux reported.   

## 2019-03-21 NOTE — Assessment & Plan Note (Signed)
Tick bite as outlined.  No headache now. No fever. No body aches.  Being treated for sinus infection with doxycycline.  Follow.  Notify me if problems.

## 2019-03-23 ENCOUNTER — Ambulatory Visit: Payer: BLUE CROSS/BLUE SHIELD | Admitting: Internal Medicine

## 2019-03-24 ENCOUNTER — Telehealth: Payer: Self-pay | Admitting: Internal Medicine

## 2019-03-24 NOTE — Telephone Encounter (Signed)
NO its ok

## 2019-03-24 NOTE — Telephone Encounter (Signed)
Patient is set up for a colon on 03/30/2019. Will the medicine interfere Sir?

## 2019-03-24 NOTE — Telephone Encounter (Signed)
Informed patient that it will not interfere per Dr. Carlean Purl. Patient verbalized understanding.

## 2019-03-29 ENCOUNTER — Telehealth: Payer: Self-pay | Admitting: *Deleted

## 2019-03-29 ENCOUNTER — Encounter: Payer: Self-pay | Admitting: Internal Medicine

## 2019-03-29 NOTE — Telephone Encounter (Signed)
Covid-19 travel screening questions  Have you traveled in the last 14 days? no If yes where?  Do you now or have you had a fever in the last 14 days? no  Do you have any respiratory symptoms of shortness of breath or cough now or in the last 14 days? no  Do you have any family members or close contacts with diagnosed or suspected Covid-19? No  Pt is aware that care partner will be waiting in car during procedure.  She will wear a mask into building.

## 2019-03-30 ENCOUNTER — Encounter: Payer: Self-pay | Admitting: Internal Medicine

## 2019-03-30 ENCOUNTER — Other Ambulatory Visit: Payer: Self-pay

## 2019-03-30 ENCOUNTER — Ambulatory Visit (AMBULATORY_SURGERY_CENTER): Payer: BLUE CROSS/BLUE SHIELD | Admitting: Internal Medicine

## 2019-03-30 VITALS — BP 104/65 | HR 84 | Temp 99.1°F | Resp 18 | Ht 64.0 in | Wt 178.0 lb

## 2019-03-30 DIAGNOSIS — K56699 Other intestinal obstruction unspecified as to partial versus complete obstruction: Secondary | ICD-10-CM | POA: Diagnosis not present

## 2019-03-30 DIAGNOSIS — K635 Polyp of colon: Secondary | ICD-10-CM | POA: Diagnosis not present

## 2019-03-30 DIAGNOSIS — D120 Benign neoplasm of cecum: Secondary | ICD-10-CM | POA: Diagnosis not present

## 2019-03-30 DIAGNOSIS — R194 Change in bowel habit: Secondary | ICD-10-CM

## 2019-03-30 DIAGNOSIS — Z86010 Personal history of colonic polyps: Secondary | ICD-10-CM | POA: Insufficient documentation

## 2019-03-30 MED ORDER — SODIUM CHLORIDE 0.9 % IV SOLN: 500.0000 mL | Freq: Once | INTRAVENOUS | Status: DC

## 2019-03-30 NOTE — Progress Notes (Signed)
Called to room to assist during endoscopic procedure.  Patient ID and intended procedure confirmed with present staff. Received instructions for my participation in the procedure from the performing physician.  

## 2019-03-30 NOTE — Patient Instructions (Addendum)
I found and removed 2 small polyps. Not causing problems.  I sense that you have some scar tissue surrounding the lower colon - likely from prior surgery - and suspect that is causing bowel habit changes.  Increasing fiber in diet or taking 1 tablespoon of psyllium or Benefiber daily can help also.  I will let you know pathology results and when to have another routine colonoscopy by mail and/or My Chart.  I appreciate the opportunity to care for you. Gatha Mayer, MD, FACG  YOU HAD AN ENDOSCOPIC PROCEDURE TODAY AT [facility]:   Refer to the procedure report that was given to you for any specific questions about what was found during the examination.  If the procedure report does not answer your questions, please call your gastroenterologist to clarify.  If you requested that your care partner not be given the details of your procedure findings, then the procedure report has been included in a sealed envelope for you to review at your convenience later.  YOU SHOULD EXPECT: Some feelings of bloating in the abdomen. Passage of more gas than usual.  Walking can help get rid of the air that was put into your GI tract during the procedure and reduce the bloating. If you had a lower endoscopy (such as a colonoscopy or flexible sigmoidoscopy) you may notice spotting of blood in your stool or on the toilet paper. If you underwent a bowel prep for your procedure, you may not have a normal bowel movement for a few days.  Please Note:  You might notice some irritation and congestion in your nose or some drainage.  This is from the oxygen used during your procedure.  There is no need for concern and it should clear up in a day or so.  SYMPTOMS TO REPORT IMMEDIATELY:   Following lower endoscopy (colonoscopy or flexible sigmoidoscopy):  Excessive amounts of blood in the stool  Significant tenderness or worsening of abdominal pains  Swelling of the abdomen that is new, acute  Fever of 100F or higher  For urgent or emergent issues, a gastroenterologist can be reached at any hour by calling [phone].  DIET:  We do recommend a small meal at first, but then you may proceed to your regular diet.  Drink plenty of fluids but you should avoid alcoholic beverages for 24 hours.  ACTIVITY:  You should plan to take it easy for the rest of today and you should NOT DRIVE or use heavy machinery until tomorrow (because of the sedation medicines used during the test).    FOLLOW UP: Our staff will call the number listed on your records the next business day following your procedure to check on you and address any questions or concerns that you may have regarding the information given to you following your procedure. If we do not reach you, we will leave a message.  However, if you are feeling well and you are not experiencing any problems, there is no need to return our call.  We will assume that you have returned to your regular daily activities without incident. We will be calling you two weeks following your procedure to see if you have developed any symptoms of the COVID-19.  If you develop any symptoms before then, please let us know.  If any biopsies were taken you will be contacted by phone or by letter within the next 1-3 weeks.  Please call us at [phone] if you have not heard about the  biopsies in 3 weeks.   Please read over handout about polyps and high fiber diets  Continue your normal medications  SIGNATURES/CONFIDENTIALITY: You and/or your care partner have signed paperwork which will be entered into your electronic medical record.  These signatures attest to the fact that that the information above on your After Visit Summary has been reviewed and is understood.  Full responsibility of the confidentiality of this discharge information lies with you and/or your care-partner.

## 2019-03-30 NOTE — Progress Notes (Signed)
PT taken to PACU. Monitors in place. VSS. Report given to RN. 

## 2019-03-30 NOTE — Progress Notes (Signed)
Pt's states no medical or surgical changes since previsit or office visit. 

## 2019-03-30 NOTE — Op Note (Signed)
[facility] Patient Name: Anna [name] Procedure Date: 03/30/2019 1:01 PM MRN: [id number] Endoscopist: Gatha Mayer , MD Age: 63 Referring MD:  Date of Birth: [birth year] Gender: Female Account #: [id number] Procedure:                Colonoscopy Indications:              Personal history of malignant ovarian neoplasm,                            Change in bowel habits Medicines:                Propofol per Anesthesia, Monitored Anesthesia Care Procedure:                Pre-Anesthesia Assessment:                           - Prior to the procedure, a History and Physical                            was performed, and patient medications and                            allergies were reviewed. The patient's tolerance of                            previous anesthesia was also reviewed. The risks                            and benefits of the procedure and the sedation                            options and risks were discussed with the patient.                            All questions were answered, and informed consent                            was obtained. Prior Anticoagulants: The patient has                            taken no previous anticoagulant or antiplatelet                            agents. ASA Grade Assessment: II - A patient with                            mild systemic disease. After reviewing the risks                            and benefits, the patient was deemed in                            satisfactory condition to undergo the procedure.  After obtaining informed consent, the colonoscope                            was passed under direct vision. Throughout the                            procedure, the patient's blood pressure, pulse, and                            oxygen saturations were monitored continuously. The                            Colonoscope was introduced through the anus and                            advanced to the  the cecum, identified by                            appendiceal orifice and ileocecal valve. The                            colonoscopy was performed without difficulty. The                            patient tolerated the procedure well. The quality                            of the bowel preparation was excellent. The                            ileocecal valve, appendiceal orifice, and rectum                            were photographed. Scope In: 1:08:40 PM Scope Out: 1:26:58 PM Scope Withdrawal Time: 0 hours 15 minutes 4 seconds  Total Procedure Duration: 0 hours 18 minutes 18 seconds  Findings:                 The perianal and digital rectal examinations were                            normal.                           Two sessile polyps were found in the cecum. The                            polyps were 2 to 6 mm in size. These polyps were                            removed with a cold snare. Resection and retrieval                            were complete. Verification of patient  identification for the specimen was done. Estimated                            blood loss was minimal.                           An extrinsic mild stenosis was found in the sigmoid                            colon and was traversed.                           The exam was otherwise without abnormality on                            direct and retroflexion views. Complications:            No immediate complications. Estimated Blood Loss:     Estimated blood loss was minimal. Impression:               - Two 2 to 6 mm polyps in the cecum, removed with a                            cold snare. Resected and retrieved.                           - Stricture in the sigmoid colon. VERY MILD                            STENOSIS AND FIXED PORTION OF SIGMOID - SLIGHT                            SUPECT FROM ADHESIONS                           - The examination was otherwise normal on direct                             and retroflexion views. Recommendation:           - Patient has a contact number available for                            emergencies. The signs and symptoms of potential                            delayed complications were discussed with the                            patient. Return to normal activities tomorrow.                            Written discharge instructions were provided to the                            patient.                           -  High fiber diet. Or Psyllium, Benefiber                           - Continue present medications.                           - Repeat colonoscopy for surveillance based on                            pathology results. Gatha Mayer, MD 03/30/2019 1:41:57 PM This report has been signed electronically.

## 2019-04-01 ENCOUNTER — Telehealth: Payer: Self-pay | Admitting: *Deleted

## 2019-04-01 NOTE — Telephone Encounter (Signed)
  Follow up Call-  Call back number 03/30/2019  Post procedure Call Back phone  # [phone]  Permission to leave phone message Yes  Some recent data might be hidden     Patient questions:  Do you have a fever, pain , or abdominal swelling? No. Pain Score  0 *  Have you tolerated food without any problems? Yes.    Have you been able to return to your normal activities? Yes.    Do you have any questions about your discharge instructions: Diet   No. Medications  No. Follow up visit  No.  Do you have questions or concerns about your Care? No.  Actions: * If pain score is 4 or above: No action needed, pain <4.  1. Have you developed a fever since your procedure? No   2.   Have you had an respiratory symptoms (SOB or cough) since your procedure? no  3.   Have you tested positive for COVID 19 since your procedure no  3.   Have you had any family members/close contacts diagnosed with the COVID 19 since your procedure?  no   If any of these questions are a yes, please inquire if patient has been seen by family doctor and route this note to Joylene John, Therapist, sports.

## 2019-04-08 ENCOUNTER — Encounter: Payer: Self-pay | Admitting: Internal Medicine

## 2019-04-08 NOTE — Progress Notes (Signed)
2 ssp's Recall 2025 My Chart letter

## 2019-04-13 ENCOUNTER — Telehealth: Payer: Self-pay

## 2019-04-13 ENCOUNTER — Encounter: Payer: Self-pay | Admitting: Internal Medicine

## 2019-04-13 NOTE — Telephone Encounter (Signed)
Copied from [facility] [id number]. Topic: General - Other >> Apr 13, 2019 11:10 AM Keene Breath wrote: Reason for CRM: Patient called to request that the nurse call her regarding coding of a bill.  Patient has a question on how the visit was coded.  Please advise and call patient at [phone]

## 2019-04-16 NOTE — Telephone Encounter (Signed)
Pt called about billing issue and I advised her that Arrie Aran was working on it. She asked if Dawn can please call her when it has been corrected / please advise

## 2019-04-23 NOTE — Telephone Encounter (Signed)
I corrected note and have already addressed.  Is there anything more that I need to do.

## 2019-04-23 NOTE — Telephone Encounter (Signed)
Message was sent to patient on 04/19/19

## 2019-04-26 ENCOUNTER — Telehealth: Payer: Self-pay | Admitting: Oncology

## 2019-04-26 NOTE — Telephone Encounter (Signed)
GBS PAL moved 6/19 moved lab/fu to 6/18. Left message. Schedule mailed.

## 2019-05-06 ENCOUNTER — Inpatient Hospital Stay: Payer: BC Managed Care – PPO

## 2019-05-06 ENCOUNTER — Inpatient Hospital Stay: Payer: BC Managed Care – PPO | Attending: Oncology | Admitting: Oncology

## 2019-05-06 ENCOUNTER — Other Ambulatory Visit: Payer: BLUE CROSS/BLUE SHIELD

## 2019-05-06 ENCOUNTER — Other Ambulatory Visit: Payer: Self-pay

## 2019-05-06 VITALS — BP 138/82 | HR 89 | Temp 98.3°F | Resp 17 | Ht 64.0 in | Wt 183.7 lb

## 2019-05-06 DIAGNOSIS — C5702 Malignant neoplasm of left fallopian tube: Secondary | ICD-10-CM

## 2019-05-06 DIAGNOSIS — E039 Hypothyroidism, unspecified: Secondary | ICD-10-CM | POA: Diagnosis not present

## 2019-05-06 DIAGNOSIS — Z808 Family history of malignant neoplasm of other organs or systems: Secondary | ICD-10-CM | POA: Insufficient documentation

## 2019-05-06 DIAGNOSIS — Z9221 Personal history of antineoplastic chemotherapy: Secondary | ICD-10-CM | POA: Diagnosis not present

## 2019-05-06 DIAGNOSIS — F329 Major depressive disorder, single episode, unspecified: Secondary | ICD-10-CM | POA: Insufficient documentation

## 2019-05-06 NOTE — Progress Notes (Signed)
  [place] OFFICE PROGRESS NOTE   Diagnosis: Fallopian tube carcinoma  INTERVAL HISTORY:   Anna [name] returns as scheduled.  She reports feeling well.  No difficulty with bowel function.  No abdominal pain.  No complaint.  She had a colonoscopy in May.  Polyps were removed from the cecum.  An extrinsic mild stenosis was found at the sigmoid colon.  This was suspected to be due to adhesions.  The pathology revealed multiple fragments of sessile serrated polyps.  Objective:  Vital signs in last 24 hours:  Blood pressure 138/82, pulse 89, temperature 98.3 F (36.8 C), temperature source Oral, resp. rate 17, height _0  (1.626 m), weight 183 lb 11.2 oz (83.3 kg), SpO2 100 %.    HEENT: Neck without mass Lymphatics: No cervical, supraclavicular, axillary, or inguinal nodes GI: Nondistended, no hepatosplenomegaly, no apparent ascites, no mass, nontender Vascular: No leg edema    Lab Results:  Lab Results  Component Value Date   WBC 6.7 02/09/2019   HGB 12.5 02/09/2019   HCT 37.4 02/09/2019   MCV 95.2 02/09/2019   PLT 302.0 02/09/2019   NEUTROABS 3.4 02/09/2019    CMP  Lab Results  Component Value Date   NA 136 02/09/2019   K 4.3 02/09/2019   CL 102 02/09/2019   CO2 26 02/09/2019   GLUCOSE 98 02/09/2019   BUN 20 02/09/2019   CREATININE 0.96 02/09/2019   CALCIUM 9.8 02/09/2019   PROT 7.6 02/09/2019   ALBUMIN 4.5 02/09/2019   AST 21 02/09/2019   ALT 24 02/09/2019   ALKPHOS 86 02/09/2019   BILITOT 0.4 02/09/2019   GFRNONAA >60 07/18/2017   GFRAA >60 07/18/2017   Ca125 on 03/10/2019: 12.2   Medications: I have reviewed the patient's current medications.   Assessment/Plan:  1. Left abdomen/pelvic pain ? CT abdomen/pelvis 06/26/2017-soft tissue implants in the lower anterior peritoneum with implants at the paracolic gutters and left pelvic sidewall ? NegativeMYRIADhereditary cancer panel ? Elevated CA 125 ? CT biopsy of anterior omental mass  07/04/2017-Metastatic carcinoma consistent with a gynecologic primary ? 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 Cycle 1 adjuvant Taxol/carboplatin 08/05/2017 ? Cycle 2 adjuvant Taxol/carboplatin 08/26/2017 ? Cycle 3 adjuvant Taxol/carboplatin 09/17/2017-Taxol dose reduced secondary to neuropathy and bone pain ? Cycle 4 adjuvant Taxol/carboplatin 10/10/2017 ? Cycle 5 adjuvant Taxol/carboplatin 10/31/2017 ? Cycle 6 adjuvant Taxol/carboplatin 11/21/2017  2. Depression  3. Hypothyroid  4. Family history of uterine cancer-paternal grandmother   Disposition: [name] is in clinical remission from fallopian tube carcinoma.  We will follow-up on the Ca1 25 from today.  The mild sigmoid stricture on colonoscopy last month is most likely related to adhesions, but this could reflect early progression of fallopian tube carcinoma.  She will return for an office visit and Ca1 25 in 6 months.    Anna Coder, MD  05/06/2019  8:45 AM

## 2019-05-07 ENCOUNTER — Other Ambulatory Visit: Payer: Self-pay | Admitting: Internal Medicine

## 2019-05-07 ENCOUNTER — Ambulatory Visit: Payer: BLUE CROSS/BLUE SHIELD | Admitting: Oncology

## 2019-05-07 ENCOUNTER — Other Ambulatory Visit: Payer: BLUE CROSS/BLUE SHIELD

## 2019-05-07 ENCOUNTER — Telehealth: Payer: Self-pay | Admitting: Internal Medicine

## 2019-05-07 ENCOUNTER — Other Ambulatory Visit: Payer: Self-pay

## 2019-05-07 LAB — CA 125: Cancer Antigen (CA) 125: 16 U/mL (ref 0.0–38.1)

## 2019-05-07 MED ORDER — LEVOTHYROXINE SODIUM 88 MCG PO TABS: ORAL_TABLET | ORAL | 0 refills | Status: DC

## 2019-05-07 NOTE — Telephone Encounter (Signed)
Medication Refill - Medication: synthroid - pharm has changed their generic from levothyroxine to euthyrox.  This med needs to be called in as euthyrox  Has the patient contacted their pharmacy? Yes.   (Agent: If no, request that the patient contact the pharmacy for the refill.) (Agent: If yes, when and what did the pharmacy advise?)  Preferred Pharmacy (with phone number or street name): [facility] [address]    Agent: Please be advised that RX refills may take up to 3 business days. We ask that you follow-up with your pharmacy.

## 2019-05-07 NOTE — Telephone Encounter (Signed)
°  Relation to IR:SWNI  Call back [place]: [place], [place] [phone] (Phone) [phone] (Fax)      Reason for call:  Patient requesting levothyroxine (SYNTHROID, LEVOTHROID) 88 MCG tablet, patient states pharmacy faxed over request a few days)

## 2019-05-07 NOTE — Telephone Encounter (Signed)
Refill sent.

## 2019-05-07 NOTE — Telephone Encounter (Signed)
Caryl Pina called from [place] stating she needs levothyroxine to euthyrox.    [place], [place] [phone] (Phone) [phone]    [phone]

## 2019-05-08 ENCOUNTER — Encounter: Payer: Self-pay | Admitting: Oncology

## 2019-05-10 ENCOUNTER — Other Ambulatory Visit: Payer: Self-pay | Admitting: *Deleted

## 2019-05-10 ENCOUNTER — Telehealth: Payer: Self-pay | Admitting: Oncology

## 2019-05-10 DIAGNOSIS — C5702 Malignant neoplasm of left fallopian tube: Secondary | ICD-10-CM

## 2019-05-10 NOTE — Telephone Encounter (Signed)
Scheduled per los. Mailed printout  °

## 2019-05-11 ENCOUNTER — Telehealth: Payer: Self-pay | Admitting: Oncology

## 2019-05-11 NOTE — Telephone Encounter (Signed)
Scheduled appt per 6/22 sch message- pt aware of apt date and time

## 2019-07-05 ENCOUNTER — Ambulatory Visit: Payer: BLUE CROSS/BLUE SHIELD | Admitting: Internal Medicine

## 2019-08-04 ENCOUNTER — Other Ambulatory Visit: Payer: Self-pay

## 2019-08-04 ENCOUNTER — Inpatient Hospital Stay: Payer: BC Managed Care – PPO | Attending: Oncology

## 2019-08-04 DIAGNOSIS — C5702 Malignant neoplasm of left fallopian tube: Secondary | ICD-10-CM | POA: Diagnosis not present

## 2019-08-05 LAB — CA 125: Cancer Antigen (CA) 125: 27.3 U/mL (ref 0.0–38.1)

## 2019-08-06 ENCOUNTER — Telehealth: Payer: Self-pay | Admitting: *Deleted

## 2019-08-06 DIAGNOSIS — C5702 Malignant neoplasm of left fallopian tube: Secondary | ICD-10-CM

## 2019-08-06 NOTE — Telephone Encounter (Signed)
Notified patient that CA125 is still in normal range, but it has increased with each check. MD suggests repeat CT scan abd/pelvis and patient is in agreement. Orders placed and scheduling message sent for labs day of CT scan.

## 2019-08-12 ENCOUNTER — Other Ambulatory Visit: Payer: Self-pay | Admitting: Internal Medicine

## 2019-08-16 ENCOUNTER — Other Ambulatory Visit: Payer: Self-pay

## 2019-08-16 ENCOUNTER — Ambulatory Visit (HOSPITAL_COMMUNITY)
Admission: RE | Admit: 2019-08-16 | Discharge: 2019-08-16 | Disposition: A | Discharge status: Home / Self Care | Payer: BC Managed Care – PPO | Source: Ambulatory Visit | Attending: Oncology | Admitting: Oncology

## 2019-08-16 ENCOUNTER — Encounter (HOSPITAL_COMMUNITY): Payer: Self-pay

## 2019-08-16 ENCOUNTER — Inpatient Hospital Stay: Payer: BC Managed Care – PPO

## 2019-08-16 DIAGNOSIS — C5702 Malignant neoplasm of left fallopian tube: Secondary | ICD-10-CM | POA: Diagnosis not present

## 2019-08-16 DIAGNOSIS — N281 Cyst of kidney, acquired: Secondary | ICD-10-CM | POA: Diagnosis not present

## 2019-08-16 LAB — BASIC METABOLIC PANEL - CANCER CENTER ONLY
Anion gap: 7 (ref 5–15)
BUN: 16 mg/dL (ref 8–23)
CO2: 27 mmol/L (ref 22–32)
Calcium: 9.4 mg/dL (ref 8.9–10.3)
Chloride: 103 mmol/L (ref 98–111)
Creatinine: 0.93 mg/dL (ref 0.44–1.00)
GFR, Est AFR Am: >60 mL/min (ref >60)
GFR, Estimated: >60 mL/min (ref >60)
Glucose, Bld: 95 mg/dL (ref 70–99)
Potassium: 4.6 mmol/L (ref 3.5–5.1)
Sodium: 137 mmol/L (ref 135–145)

## 2019-08-16 MED ORDER — IOHEXOL 300 MG/ML  SOLN
100.0000 mL | Freq: Once | INTRAMUSCULAR | Status: AC | PRN
  Administered 2019-08-16: 100 mL via INTRAVENOUS

## 2019-08-16 MED ORDER — SODIUM CHLORIDE (PF) 0.9 % IJ SOLN
INTRAMUSCULAR | Status: AC
  Filled 2019-08-16: qty 50

## 2019-08-18 ENCOUNTER — Telehealth: Payer: Self-pay | Admitting: *Deleted

## 2019-08-18 NOTE — Telephone Encounter (Signed)
Per Dr. Benay Spice: Called patient and provided appointment for 08/20/19 at 1:45.

## 2019-08-20 ENCOUNTER — Inpatient Hospital Stay: Payer: BC Managed Care – PPO | Attending: Oncology | Admitting: Oncology

## 2019-08-20 ENCOUNTER — Other Ambulatory Visit: Payer: Self-pay

## 2019-08-20 ENCOUNTER — Other Ambulatory Visit: Payer: BC Managed Care – PPO

## 2019-08-20 VITALS — BP 134/77 | HR 83 | Temp 98.2°F | Resp 18 | Ht 64.0 in | Wt 178.0 lb

## 2019-08-20 DIAGNOSIS — Z23 Encounter for immunization: Secondary | ICD-10-CM | POA: Insufficient documentation

## 2019-08-20 DIAGNOSIS — F329 Major depressive disorder, single episode, unspecified: Secondary | ICD-10-CM | POA: Insufficient documentation

## 2019-08-20 DIAGNOSIS — Z9221 Personal history of antineoplastic chemotherapy: Secondary | ICD-10-CM | POA: Insufficient documentation

## 2019-08-20 DIAGNOSIS — Z5111 Encounter for antineoplastic chemotherapy: Secondary | ICD-10-CM | POA: Insufficient documentation

## 2019-08-20 DIAGNOSIS — E039 Hypothyroidism, unspecified: Secondary | ICD-10-CM | POA: Diagnosis not present

## 2019-08-20 DIAGNOSIS — Z7189 Other specified counseling: Secondary | ICD-10-CM | POA: Diagnosis not present

## 2019-08-20 DIAGNOSIS — C5702 Malignant neoplasm of left fallopian tube: Secondary | ICD-10-CM

## 2019-08-20 DIAGNOSIS — K5900 Constipation, unspecified: Secondary | ICD-10-CM | POA: Insufficient documentation

## 2019-08-20 MED ORDER — ONDANSETRON HCL 8 MG PO TABS: 8.0000 mg | ORAL_TABLET | Freq: Three times a day (TID) | ORAL | 2 refills | Status: DC | PRN

## 2019-08-20 MED ORDER — LIDOCAINE-PRILOCAINE 2.5-2.5 % EX CREA: 1.0000 "application " | TOPICAL_CREAM | CUTANEOUS | 3 refills | Status: DC

## 2019-08-20 MED ORDER — INFLUENZA VAC SPLIT QUAD 0.5 ML IM SUSY
PREFILLED_SYRINGE | INTRAMUSCULAR | Status: AC
  Filled 2019-08-20: qty 0.5

## 2019-08-20 MED ORDER — INFLUENZA VAC SPLIT QUAD 0.5 ML IM SUSY
0.5000 mL | PREFILLED_SYRINGE | Freq: Once | INTRAMUSCULAR | Status: AC
  Administered 2019-08-20: 0.5 mL via INTRAMUSCULAR

## 2019-08-20 MED ORDER — PROCHLORPERAZINE MALEATE 10 MG PO TABS: 10.0000 mg | ORAL_TABLET | Freq: Four times a day (QID) | ORAL | 1 refills | Status: DC | PRN

## 2019-08-20 NOTE — Progress Notes (Signed)
Provided verbal and printed information on Bevacizumab and information on What is a Biologic to patient. She is aware of port and use of EMLA cream from caring for her husband.

## 2019-08-20 NOTE — Progress Notes (Signed)
Anna [name]   Diagnosis: Fallopian tube cancer  INTERVAL HISTORY:   Anna [name] returns prior to a scheduled visit.  The Ca125 was higher last month.  She was referred for a CT of the abdomen and pelvis.  This is consistent with tumor spread to the peritoneum and lymph nodes.  She reports mild discomfort at the left low lateral abdomen.  She has difficulty having complete bowel movements.  She has to strain.  She is taking a stool softener.  She had an episode of right flank discomfort earlier this week.  This has resolved.  Good appetite.  She has no remaining neuropathy symptoms. Objective:  Vital signs in last 24 hours:  Blood pressure 134/77, pulse 83, temperature 98.2 F (36.8 C), temperature source Temporal, resp. rate 18, height 5' 4"  (1.626 m), weight 178 lb (80.7 kg), SpO2 100 %.    HEENT: Neck without mass Lymphatics: No cervical, supraclavicular, axillary, or inguinal nodes Resp: Lungs clear bilaterally Cardio: Regular rate and rhythm GI: No apparent ascites, no mass, nontender, no hepatomegaly Vascular: No leg edema  Lab Results:  Lab Results  Component Value Date   WBC 6.7 02/09/2019   HGB 12.5 02/09/2019   HCT 37.4 02/09/2019   MCV 95.2 02/09/2019   PLT 302.0 02/09/2019   NEUTROABS 3.4 02/09/2019    CMP  Lab Results  Component Value Date   NA 137 08/16/2019   K 4.6 08/16/2019   CL 103 08/16/2019   CO2 27 08/16/2019   GLUCOSE 95 08/16/2019   BUN 16 08/16/2019   CREATININE 0.93 08/16/2019   CALCIUM 9.4 08/16/2019   PROT 7.6 02/09/2019   ALBUMIN 4.5 02/09/2019   AST 21 02/09/2019   ALT 24 02/09/2019   ALKPHOS 86 02/09/2019   BILITOT 0.4 02/09/2019   GFRNONAA >60 08/16/2019   GFRAA >60 08/16/2019     Imaging:  Ct Abdomen Pelvis W Contrast  Result Date: 08/16/2019 CLINICAL DATA:  History of fallopian tube cancer diagnosed in 2018 with completion of chemo in 2019. EXAM: CT ABDOMEN AND PELVIS WITH CONTRAST  TECHNIQUE: Multidetector CT imaging of the abdomen and pelvis was performed using the standard protocol following bolus administration of intravenous contrast. CONTRAST:  181m OMNIPAQUE IOHEXOL 300 MG/ML  SOLN COMPARISON:  None. FINDINGS: Lower chest: No signs of consolidation or evidence of pleural effusion. Hepatobiliary: No signs of focal hepatic lesion. No biliary ductal dilation. Pancreas: Unremarkable. No pancreatic ductal dilatation or surrounding inflammatory changes. Spleen: Normal size without focal lesion. Suprapubic herniation of small bowel in the midline following hysterectomy. No signs of acute bowel process. Adrenals/Urinary Tract: Normal appearance of the bilateral adrenal glands. Cyst arising from upper pole right kidney is unchanged. No signs of hydronephrosis. Small cyst also along the interpolar left kidney. Stomach/Bowel: No sign of acute gastrointestinal process. Suprapubic hernia containing small bowel loops has developed since the previous study following hysterectomy. Narrowing of the rectosigmoid colon (image 70 of series 2. 19 x 15 mm eccentric serosal implant along the margin of the colon. Peritoneal thickening was demonstrated previously. Lymph nodes along IMV pathways are noted as well with a 10 mm lymph node along the superior rectal vein. Left upper quadrant soft tissue implant measuring 10 by 8 mm 2 new from previous study. Vascular/Lymphatic: Scattered atherosclerosis. Precaval lymph node (image 49, series 3) mixed density measuring 9 x 7 mm. No additional suspicious nodes by size criteria in the upper abdomen or retroperitoneum. Internal iliac lymph node on the  left measuring 15 x 10 mm. Reproductive: Post hysterectomy as described. Other: No signs of ascites. Musculoskeletal: No acute or significant osseous findings. IMPRESSION: 1. Signs of peritoneal and serosal disease in the abdomen and pelvis as discussed. Thickening of the rectosigmoid colon is favored to represent  extrinsic peritoneal/serosal implant. 2. Nodal disease in the pelvis and retroperitoneum as described. Electronically Signed   By: Zetta Bills M.D.   On: 08/16/2019 16:36    Medications: I have reviewed the patient's current medications.   Assessment/Plan: 1. Left abdomen/pelvic pain ? CT abdomen/pelvis 06/26/2017-soft tissue implants in the lower anterior peritoneum with implants at the paracolic gutters and left pelvic sidewall ? NegativeMYRIADhereditary cancer panel ? Elevated CA 125 ? CT biopsy of anterior omental mass 07/04/2017-Metastatic carcinoma consistent with a gynecologic primary ? 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 Cycle 1 adjuvant Taxol/carboplatin 08/05/2017 ? Cycle 2 adjuvant Taxol/carboplatin 08/26/2017 ? Cycle 3 adjuvant Taxol/carboplatin 09/17/2017-Taxol dose reduced secondary to neuropathy and bone pain ? Cycle 4 adjuvant Taxol/carboplatin 10/10/2017 ? Cycle 5 adjuvant Taxol/carboplatin 10/31/2017 ? Cycle 6 adjuvant Taxol/carboplatin 11/21/2017 ? CT abdomen/pelvis 08/08/2019- narrowing of rectosigmoid colon with eccentric serosal implant, 10 mm lymph node at the superior rectal vein, left upper quadrant soft tissue implant, 9 mm precaval node, 15 mm left internal iliac node, no ascites  2. Depression  3. Hypothyroid  4. Family history of uterine cancer-paternal grandmother     Disposition: Anna [name] has a history of fallopian tube carcinoma.  The Ca125 is higher and a restaging CT is consistent with progressive disease.  I discussed the CT findings and reviewed the images  with Anna [name].  Anna [name] was present by telephone for today's visit.  She understands no therapy will be curative.  We discussed continued observation versus beginning salvage therapy.  The left abdominal discomfort and difficulty with bowel movements may be related to the serosal/peritoneal implants noted in the left abdomen and pelvis.  She was noted to have stenosis of the sigmoid colon on a colonoscopy in May.  I recommend beginning salvage therapy with Taxol/carboplatin and bevacizumab.  We reviewed potential toxicities associated with this regimen including the chance for hematologic toxicity, neuropathy and allergic reaction.  We reviewed side effects associated with Avastin including the chance of hypertension, bleeding, thromboembolic disease, bowel perforation, delayed wound healing, CNS toxicity, and nephrotoxicity.  She agrees to proceed.  Anna [name] will be referred for Port-A-Cath placement with the plan to begin cycle 1 of salvage therapy on 08/31/2019.  She received an influenza vaccine today.  She will continue a stool softener and begin MiraLAX as needed.  40 minutes were spent with the patient today.  The majority of the time was used for counseling and coordination of care.  [name], MD  08/20/2019  12:15 PM

## 2019-08-25 ENCOUNTER — Other Ambulatory Visit: Payer: Self-pay | Admitting: Radiology

## 2019-08-26 ENCOUNTER — Ambulatory Visit (HOSPITAL_COMMUNITY)
Admission: RE | Admit: 2019-08-26 | Discharge: 2019-08-26 | Disposition: A | Discharge status: Home / Self Care | Payer: BC Managed Care – PPO | Source: Ambulatory Visit | Attending: Oncology | Admitting: Oncology

## 2019-08-26 ENCOUNTER — Encounter (HOSPITAL_COMMUNITY): Payer: Self-pay

## 2019-08-26 ENCOUNTER — Other Ambulatory Visit: Payer: Self-pay

## 2019-08-26 ENCOUNTER — Observation Stay (HOSPITAL_COMMUNITY)
Admission: RE | Admit: 2019-08-26 | Discharge: 2019-08-26 | Disposition: A | Discharge status: Home / Self Care | Payer: BC Managed Care – PPO | Source: Ambulatory Visit | Attending: Oncology | Admitting: Oncology

## 2019-08-26 ENCOUNTER — Other Ambulatory Visit: Payer: Self-pay | Admitting: Oncology

## 2019-08-26 DIAGNOSIS — C5702 Malignant neoplasm of left fallopian tube: Secondary | ICD-10-CM

## 2019-08-26 DIAGNOSIS — E7800 Pure hypercholesterolemia, unspecified: Secondary | ICD-10-CM | POA: Insufficient documentation

## 2019-08-26 DIAGNOSIS — E039 Hypothyroidism, unspecified: Secondary | ICD-10-CM | POA: Diagnosis not present

## 2019-08-26 DIAGNOSIS — Z8543 Personal history of malignant neoplasm of ovary: Secondary | ICD-10-CM | POA: Diagnosis not present

## 2019-08-26 DIAGNOSIS — Z8049 Family history of malignant neoplasm of other genital organs: Secondary | ICD-10-CM | POA: Diagnosis not present

## 2019-08-26 DIAGNOSIS — R011 Cardiac murmur, unspecified: Secondary | ICD-10-CM | POA: Insufficient documentation

## 2019-08-26 DIAGNOSIS — F329 Major depressive disorder, single episode, unspecified: Secondary | ICD-10-CM | POA: Insufficient documentation

## 2019-08-26 DIAGNOSIS — Z5111 Encounter for antineoplastic chemotherapy: Secondary | ICD-10-CM | POA: Diagnosis not present

## 2019-08-26 DIAGNOSIS — G4730 Sleep apnea, unspecified: Secondary | ICD-10-CM | POA: Insufficient documentation

## 2019-08-26 DIAGNOSIS — I10 Essential (primary) hypertension: Secondary | ICD-10-CM | POA: Insufficient documentation

## 2019-08-26 DIAGNOSIS — Z79899 Other long term (current) drug therapy: Secondary | ICD-10-CM | POA: Insufficient documentation

## 2019-08-26 DIAGNOSIS — Z8589 Personal history of malignant neoplasm of other organs and systems: Secondary | ICD-10-CM | POA: Diagnosis not present

## 2019-08-26 DIAGNOSIS — K219 Gastro-esophageal reflux disease without esophagitis: Secondary | ICD-10-CM | POA: Insufficient documentation

## 2019-08-26 DIAGNOSIS — F419 Anxiety disorder, unspecified: Secondary | ICD-10-CM | POA: Insufficient documentation

## 2019-08-26 DIAGNOSIS — M1990 Unspecified osteoarthritis, unspecified site: Secondary | ICD-10-CM | POA: Diagnosis not present

## 2019-08-26 DIAGNOSIS — C786 Secondary malignant neoplasm of retroperitoneum and peritoneum: Secondary | ICD-10-CM | POA: Diagnosis not present

## 2019-08-26 DIAGNOSIS — C5700 Malignant neoplasm of unspecified fallopian tube: Secondary | ICD-10-CM | POA: Diagnosis not present

## 2019-08-26 HISTORY — PX: IR IMAGING GUIDED PORT INSERTION: IMG5740

## 2019-08-26 LAB — CBC WITH DIFFERENTIAL/PLATELET
Abs Immature Granulocytes: 0.03 10*3/uL (ref 0.00–0.07)
Basophils Absolute: 0 10*3/uL (ref 0.0–0.1)
Basophils Relative: 1 %
Eosinophils Absolute: 0.2 10*3/uL (ref 0.0–0.5)
Eosinophils Relative: 3 %
HCT: 41.4 % (ref 36.0–46.0)
Hemoglobin: 12.8 g/dL (ref 12.0–15.0)
Immature Granulocytes: 0 %
Lymphocytes Relative: 45 %
Lymphs Abs: 3.6 10*3/uL (ref 0.7–4.0)
MCH: 30.3 pg (ref 26.0–34.0)
MCHC: 30.9 g/dL (ref 30.0–36.0)
MCV: 98.1 fL (ref 80.0–100.0)
Monocytes Absolute: 0.6 10*3/uL (ref 0.1–1.0)
Monocytes Relative: 7 %
Neutro Abs: 3.5 10*3/uL (ref 1.7–7.7)
Neutrophils Relative %: 44 %
Platelets: 266 10*3/uL (ref 150–400)
RBC: 4.22 MIL/uL (ref 3.87–5.11)
RDW: 13.9 % (ref 11.5–15.5)
WBC: 7.9 10*3/uL (ref 4.0–10.5)
nRBC: 0 % (ref 0.0–0.2)

## 2019-08-26 LAB — PROTIME-INR
INR: 0.9 (ref 0.8–1.2)
Prothrombin Time: 11.8 seconds (ref 11.4–15.2)

## 2019-08-26 MED ORDER — LIDOCAINE-EPINEPHRINE (PF) 2 %-1:200000 IJ SOLN
INTRAMUSCULAR | Status: AC
  Filled 2019-08-26: qty 20

## 2019-08-26 MED ORDER — FENTANYL CITRATE (PF) 100 MCG/2ML IJ SOLN
INTRAMUSCULAR | Status: AC | PRN
  Administered 2019-08-26 (×2): 50 ug via INTRAVENOUS

## 2019-08-26 MED ORDER — LIDOCAINE HCL (PF) 1 % IJ SOLN
INTRAMUSCULAR | Status: AC | PRN
  Administered 2019-08-26: 5 mL

## 2019-08-26 MED ORDER — LIDOCAINE-EPINEPHRINE (PF) 1 %-1:200000 IJ SOLN
INTRAMUSCULAR | Status: AC | PRN
  Administered 2019-08-26: 10 mL

## 2019-08-26 MED ORDER — LIDOCAINE HCL 1 % IJ SOLN
INTRAMUSCULAR | Status: AC
  Filled 2019-08-26: qty 20

## 2019-08-26 MED ORDER — SODIUM CHLORIDE 0.9 % IV SOLN
INTRAVENOUS | Status: DC
  Administered 2019-08-26: 14:00:00 via INTRAVENOUS

## 2019-08-26 MED ORDER — CEFAZOLIN SODIUM-DEXTROSE 2-4 GM/100ML-% IV SOLN
INTRAVENOUS | Status: AC
  Administered 2019-08-26: 2 g via INTRAVENOUS
  Filled 2019-08-26: qty 100

## 2019-08-26 MED ORDER — FENTANYL CITRATE (PF) 100 MCG/2ML IJ SOLN
INTRAMUSCULAR | Status: AC
  Filled 2019-08-26: qty 2

## 2019-08-26 MED ORDER — MIDAZOLAM HCL 2 MG/2ML IJ SOLN
INTRAMUSCULAR | Status: AC | PRN
  Administered 2019-08-26 (×3): 1 mg via INTRAVENOUS

## 2019-08-26 MED ORDER — CEFAZOLIN SODIUM-DEXTROSE 2-4 GM/100ML-% IV SOLN
2.0000 g | INTRAVENOUS | Status: AC
  Administered 2019-08-26: 16:00:00 2 g via INTRAVENOUS

## 2019-08-26 MED ORDER — HEPARIN SOD (PORK) LOCK FLUSH 100 UNIT/ML IV SOLN
INTRAVENOUS | Status: AC
  Filled 2019-08-26: qty 5

## 2019-08-26 MED ORDER — MIDAZOLAM HCL 2 MG/2ML IJ SOLN
INTRAMUSCULAR | Status: AC
  Filled 2019-08-26: qty 4

## 2019-08-26 MED ORDER — LIDOCAINE HCL (PF) 1 % IJ SOLN
INTRAMUSCULAR | Status: AC
  Filled 2019-08-26: qty 30

## 2019-08-26 MED ORDER — HEPARIN SOD (PORK) LOCK FLUSH 100 UNIT/ML IV SOLN
INTRAVENOUS | Status: AC | PRN
  Administered 2019-08-26: 500 [IU] via INTRAVENOUS

## 2019-08-26 NOTE — Discharge Instructions (Signed)
There are no changes to your home medications.  Do not use EMLA cream for your chemo appointment next week. Wait until the chemo nurses tell you it is okay to use it. Use ice in a zip lock bag for 3-4 minutes prior to nurses accessing your port     Implanted Port Insertion, Care After This sheet gives you information about how to care for yourself after your procedure. Your health care provider may also give you more specific instructions. If you have problems or questions, contact your health care provider. What can I expect after the procedure? After the procedure, it is common to have:  Discomfort at the port insertion site.  Bruising on the skin over the port. This should improve over 3-4 days. Follow these instructions at home: [facility] care  After your port is placed, you will get a manufacturer's information card. The card has information about your port. Keep this card with you at all times.  Take care of the port as told by your health care provider. Ask your health care provider if you or a family member can get training for taking care of the port at home. A home health care nurse may also take care of the port.  Make sure to remember what type of port you have. Incision care      Follow instructions from your health care provider about how to take care of your port insertion site. Make sure you: ? Wash your hands with soap and water before and after you change your bandage (dressing). If soap and water are not available, use hand sanitizer. ? Change your dressing as told by your health care provider. ? Leave stitches (sutures), skin glue, or adhesive strips in place. These skin closures may need to stay in place for 2 weeks or longer. If adhesive strip edges start to loosen and curl up, you may trim the loose edges. Do not remove adhesive strips completely unless your health care provider tells you to do that.  Check your port insertion site every day for signs of  infection. Check for: ? Redness, swelling, or pain. ? Fluid or blood. ? Warmth. ? Pus or a bad smell. Activity  Return to your normal activities as told by your health care provider. Ask your health care provider what activities are safe for you.  Do not lift anything that is heavier than 10 lb (4.5 kg), or the limit that you are told, until your health care provider says that it is safe. General instructions  Take over-the-counter and prescription medicines only as told by your health care provider.  Do not take baths, swim, or use a hot tub until your health care provider approves. Ask your health care provider if you may take showers. You may only be allowed to take sponge baths.  Do not drive for 24 hours if you were given a sedative during your procedure.  Wear a medical alert bracelet in case of an emergency. This will tell any health care providers that you have a port.  Keep all follow-up visits as told by your health care provider. This is important. Contact a health care provider if:  You cannot flush your port with saline as directed, or you cannot draw blood from the port.  You have a fever or chills.  You have redness, swelling, or pain around your port insertion site.  You have fluid or blood coming from your port insertion site.  Your port insertion site feels warm to  the touch.  You have pus or a bad smell coming from the port insertion site. Get help right away if:  You have chest pain or shortness of breath.  You have bleeding from your port that you cannot control. Summary  Take care of the port as told by your health care provider. Keep the manufacturer's information card with you at all times.  Change your dressing as told by your health care provider.  Contact a health care provider if you have a fever or chills or if you have redness, swelling, or pain around your port insertion site.  Keep all follow-up visits as told by your health care  provider. This information is not intended to replace advice given to you by your health care provider. Make sure you discuss any questions you have with your health care provider. Document Released: 08/25/2013 Document Revised: 06/02/2018 Document Reviewed: 06/02/2018 Elsevier Patient Education  [employer].    Moderate Conscious Sedation, Adult, Care After These instructions provide you with information about caring for yourself after your procedure. Your health care provider may also give you more specific instructions. Your treatment has been planned according to current medical practices, but problems sometimes occur. Call your health care provider if you have any problems or questions after your procedure. What can I expect after the procedure? After your procedure, it is common:  To feel sleepy for several hours.  To feel clumsy and have poor balance for several hours.  To have poor judgment for several hours.  To vomit if you eat too soon. Follow these instructions at home: For at least 24 hours after the procedure:   Do not: ? Participate in activities where you could fall or become injured. ? Drive. ? Use heavy machinery. ? Drink alcohol. ? Take sleeping pills or medicines that cause drowsiness. ? Make important decisions or sign legal documents. ? Take care of children on your own.  Rest. Eating and drinking  Follow the diet recommended by your health care provider.  If you vomit: ? Drink water, juice, or soup when you can drink without vomiting. ? Make sure you have little or no nausea before eating solid foods. General instructions  Have a responsible adult stay with you until you are awake and alert.  Take over-the-counter and prescription medicines only as told by your health care provider.  If you smoke, do not smoke without supervision.  Keep all follow-up visits as told by your health care provider. This is important. Contact a health care  provider if:  You keep feeling nauseous or you keep vomiting.  You feel light-headed.  You develop a rash.  You have a fever. Get help right away if:  You have trouble breathing. This information is not intended to replace advice given to you by your health care provider. Make sure you discuss any questions you have with your health care provider. Document Released: 08/25/2013 Document Revised: 10/17/2017 Document Reviewed: 02/24/2016 Elsevier Patient Education  2020 [employer].

## 2019-08-26 NOTE — H&P (Signed)
Referring Physician(s): Ladell Pier  Supervising Physician: Jacqulynn Cadet  Patient Status:  [facility] OP  Chief Complaint:  "I'm getting a port a cath"  Subjective: Patient familiar to IR service from omental mass biopsy on 07/04/2017.  She has a history of metastatic fallopian tube carcinoma, status post exploratory lap, total hysterectomy, bilateral salpingo-oophorectomy and omentectomy on 07/17/2017.  She has also undergone chemotherapy.  She now has rising CA 125 as well as progressive disease on CT.  She presents again today for Port-A-Cath placement for palliative chemotherapy.  She denies fever, headache, chest pain, dyspnea, cough, abdominal/back pain, nausea, vomiting or bleeding.    Past Medical History:  Diagnosis Date   Anxiety    Arthritis    Depression    Diverticulosis    Fallopian tube carcinoma, left ([place]) 2018   Family history of adverse reaction to anesthesia    Mother has extreme [place] with anesthesia   Family history of breast cancer    Frequent headaches    H/O   Genetic testing 11/07/2017   GERD (gastroesophageal reflux disease)    Heart murmur    Hx of   History of chicken pox    Hypercholesterolemia    Hypertension    Hypothyroidism    Pneumonia    Sleep apnea    No longer wears cpap   Past Surgical History:  Procedure Laterality Date   ABDOMINAL HYSTERECTOMY     Dr. Denman George 07/17/17   [place] Bilateral 2008   Lasix eye srg   LAPAROSCOPY N/A 07/17/2017   Procedure: LAPAROSCOPY DIAGNOSTIC;  Surgeon: Everitt Amber, MD;  Location: [facility];  Service: Gynecology;  Laterality: N/A;   LAPAROTOMY N/A 07/17/2017   Procedure: EXPLORATORY LAPAROTOMY;  Surgeon: Everitt Amber, MD;  Location: [facility];  Service: Gynecology;  Laterality: N/A;   OMENTECTOMY N/A 07/17/2017   Procedure: OMENTECTOMY WITH RADICAL TUMOR DEBULKING;  Surgeon: Everitt Amber, MD;   Location: [facility];  Service: Gynecology;  Laterality: N/A;   TUBAL LIGATION  1985      Allergies: Bee venom and Other  Medications: Prior to Admission medications   Medication Sig Start Date End Date Taking? Authorizing Provider  buPROPion (WELLBUTRIN XL) 300 MG 24 hr tablet Take 1 tablet (300 mg total) by mouth daily. 02/09/19  Yes Einar Pheasant, MD  EUTHYROX 88 MCG tablet TAKE 1 TABLET BY MOUTH ONCE DAILY BEFORE BREAKFAST 08/12/19  Yes Einar Pheasant, MD  ibuprofen (ADVIL,MOTRIN) 200 MG tablet Take 200 mg by mouth every 6 (six) hours as needed.   Yes [provider]  lisinopril (ZESTRIL) 20 MG tablet Take 1 tablet (20 mg total) by mouth daily. 03/18/19  Yes Einar Pheasant, MD  Multiple Vitamin (MULTIVITAMIN WITH MINERALS) TABS tablet Take 1 tablet by mouth daily.   Yes [provider]  omeprazole (PRILOSEC) 20 MG capsule TAKE 1 CAPSULE TWICE A DAY BEFORE MEALS 06/23/18  Yes Einar Pheasant, MD  rosuvastatin (CRESTOR) 5 MG tablet Take 1 tablet (5 mg total) by mouth daily. 02/09/19  Yes Einar Pheasant, MD  acyclovir (ZOVIRAX) 400 MG tablet TAKE 1 TABLET THREE TIMES A DAY FOR 5 DAYS AS NEEDED FOR FLARES Patient not taking: Reported on 05/06/2019 02/06/18   Einar Pheasant, MD  EPINEPHrine 0.3 mg/0.3 mL IJ SOAJ injection Inject 0.3 mLs (0.3 mg total) into the muscle as needed (for anaphylaxis). Patient not taking: Reported on  05/06/2019 06/23/18   Einar Pheasant, MD  lidocaine-prilocaine (EMLA) cream Apply 1 application topically as directed. Apply 1 hour prior to port stick and cover with plastic wrap 08/20/19   Ladell Pier, MD  ondansetron (ZOFRAN) 8 MG tablet Take 1 tablet (8 mg total) by mouth every 8 (eight) hours as needed for nausea or vomiting. 08/20/19   Ladell Pier, MD  prochlorperazine (COMPAZINE) 10 MG tablet Take 1 tablet (10 mg total) by mouth every 6 (six) hours as needed. 08/20/19   Ladell Pier, MD     Vital Signs: Blood pressure 139/82, heart rate  81, temp 98.1, respirations 19, O2 sat 98% room air   Physical Exam awake, alert.  Chest clear to auscultation bilaterally.  Heart with regular rate and rhythm.  Abdomen soft, positive bowel sounds, nontender. Ext with FROM.  Imaging: No results found.  Labs:  CBC: Recent Labs    02/09/19 0922 08/26/19 1325  WBC 6.7 7.9  HGB 12.5 12.8  HCT 37.4 41.4  PLT 302.0 266    COAGS: No results for input(s): INR, APTT in the last 8760 hours.  BMP: Recent Labs    02/09/19 0922 08/16/19 1404  NA 136 137  K 4.3 4.6  CL 102 103  CO2 26 27  GLUCOSE 98 95  BUN 20 16  CALCIUM 9.8 9.4  CREATININE 0.96 0.93  GFRNONAA  --  >60  GFRAA  --  >60    LIVER FUNCTION TESTS: Recent Labs    02/09/19 0922  BILITOT 0.4  AST 21  ALT 24  ALKPHOS 86  PROT 7.6  ALBUMIN 4.5    Assessment and Plan: Pt with history of metastatic fallopian tube carcinoma, status post exploratory lap, total hysterectomy, bilateral salpingo-oophorectomy and omentectomy on 07/17/2017.  She has also undergone chemotherapy.  She now has rising CA 125 as well as progressive disease on CT. She presents again today for Port-A-Cath placement for palliative chemotherapy. Risks and benefits of image guided port-a-catheter placement was discussed with the patient including, but not limited to bleeding, infection, pneumothorax, or fibrin sheath development and need for additional procedures.  All of the patient's questions were answered, patient is agreeable to proceed. Consent signed and in chart.     Electronically Signed: D. Rowe Robert, PA-C 08/26/2019, 1:50 PM   I spent a total of 25 minutes at the the patient's bedside AND on the patient's hospital floor or unit, greater than 50% of which was counseling/coordinating care for Port-A-Cath placement

## 2019-08-28 ENCOUNTER — Other Ambulatory Visit: Payer: Self-pay | Admitting: Internal Medicine

## 2019-08-29 ENCOUNTER — Other Ambulatory Visit: Payer: Self-pay | Admitting: Oncology

## 2019-08-31 ENCOUNTER — Inpatient Hospital Stay: Payer: BC Managed Care – PPO

## 2019-08-31 ENCOUNTER — Other Ambulatory Visit: Payer: Self-pay | Admitting: Oncology

## 2019-08-31 ENCOUNTER — Other Ambulatory Visit: Payer: Self-pay

## 2019-08-31 VITALS — BP 126/79 | HR 77 | Temp 97.8°F | Resp 18

## 2019-08-31 DIAGNOSIS — K5900 Constipation, unspecified: Secondary | ICD-10-CM | POA: Diagnosis not present

## 2019-08-31 DIAGNOSIS — Z95828 Presence of other vascular implants and grafts: Secondary | ICD-10-CM

## 2019-08-31 DIAGNOSIS — F329 Major depressive disorder, single episode, unspecified: Secondary | ICD-10-CM | POA: Diagnosis not present

## 2019-08-31 DIAGNOSIS — E039 Hypothyroidism, unspecified: Secondary | ICD-10-CM | POA: Diagnosis not present

## 2019-08-31 DIAGNOSIS — C5702 Malignant neoplasm of left fallopian tube: Secondary | ICD-10-CM

## 2019-08-31 DIAGNOSIS — Z9221 Personal history of antineoplastic chemotherapy: Secondary | ICD-10-CM | POA: Diagnosis not present

## 2019-08-31 DIAGNOSIS — Z5111 Encounter for antineoplastic chemotherapy: Secondary | ICD-10-CM | POA: Diagnosis not present

## 2019-08-31 DIAGNOSIS — Z23 Encounter for immunization: Secondary | ICD-10-CM | POA: Diagnosis not present

## 2019-08-31 LAB — CBC WITH DIFFERENTIAL (CANCER CENTER ONLY)
Abs Immature Granulocytes: 0.04 10*3/uL (ref 0.00–0.07)
Basophils Absolute: 0.1 10*3/uL (ref 0.0–0.1)
Basophils Relative: 1 %
Eosinophils Absolute: 0.2 10*3/uL (ref 0.0–0.5)
Eosinophils Relative: 3 %
HCT: 38.1 % (ref 36.0–46.0)
Hemoglobin: 12.4 g/dL (ref 12.0–15.0)
Immature Granulocytes: 1 %
Lymphocytes Relative: 37 %
Lymphs Abs: 2.9 10*3/uL (ref 0.7–4.0)
MCH: 30.6 pg (ref 26.0–34.0)
MCHC: 32.5 g/dL (ref 30.0–36.0)
MCV: 94.1 fL (ref 80.0–100.0)
Monocytes Absolute: 0.7 10*3/uL (ref 0.1–1.0)
Monocytes Relative: 8 %
Neutro Abs: 4 10*3/uL (ref 1.7–7.7)
Neutrophils Relative %: 50 %
Platelet Count: 235 10*3/uL (ref 150–400)
RBC: 4.05 MIL/uL (ref 3.87–5.11)
RDW: 14.1 % (ref 11.5–15.5)
WBC Count: 8 10*3/uL (ref 4.0–10.5)
nRBC: 0 % (ref 0.0–0.2)

## 2019-08-31 LAB — CMP (CANCER CENTER ONLY)
ALT: 22 U/L (ref 0–44)
AST: 20 U/L (ref 15–41)
Albumin: 4 g/dL (ref 3.5–5.0)
Alkaline Phosphatase: 88 U/L (ref 38–126)
Anion gap: 11 (ref 5–15)
BUN: 20 mg/dL (ref 8–23)
CO2: 25 mmol/L (ref 22–32)
Calcium: 9.5 mg/dL (ref 8.9–10.3)
Chloride: 104 mmol/L (ref 98–111)
Creatinine: 0.94 mg/dL (ref 0.44–1.00)
GFR, Est AFR Am: >60 mL/min (ref >60)
GFR, Estimated: >60 mL/min (ref >60)
Glucose, Bld: 101 mg/dL — ABNORMAL HIGH (ref 70–99)
Potassium: 4.1 mmol/L (ref 3.5–5.1)
Sodium: 140 mmol/L (ref 135–145)
Total Bilirubin: 0.3 mg/dL (ref 0.3–1.2)
Total Protein: 7.5 g/dL (ref 6.5–8.1)

## 2019-08-31 LAB — TOTAL PROTEIN, URINE DIPSTICK: Protein, ur: NEGATIVE mg/dL

## 2019-08-31 MED ORDER — SODIUM CHLORIDE 0.9 % IV SOLN
20.0000 mg | Freq: Once | INTRAVENOUS | Status: AC
  Administered 2019-08-31: 20 mg via INTRAVENOUS
  Filled 2019-08-31: qty 20

## 2019-08-31 MED ORDER — FAMOTIDINE IN NACL 20-0.9 MG/50ML-% IV SOLN
INTRAVENOUS | Status: AC
  Filled 2019-08-31: qty 50

## 2019-08-31 MED ORDER — DIPHENHYDRAMINE HCL 50 MG/ML IJ SOLN
INTRAMUSCULAR | Status: AC
  Filled 2019-08-31: qty 1

## 2019-08-31 MED ORDER — FAMOTIDINE IN NACL 20-0.9 MG/50ML-% IV SOLN
20.0000 mg | Freq: Once | INTRAVENOUS | Status: AC
  Administered 2019-08-31: 20 mg via INTRAVENOUS

## 2019-08-31 MED ORDER — PALONOSETRON HCL INJECTION 0.25 MG/5ML
0.2500 mg | Freq: Once | INTRAVENOUS | Status: AC
  Administered 2019-08-31: 0.25 mg via INTRAVENOUS

## 2019-08-31 MED ORDER — PALONOSETRON HCL INJECTION 0.25 MG/5ML
INTRAVENOUS | Status: AC
  Filled 2019-08-31: qty 5

## 2019-08-31 MED ORDER — SODIUM CHLORIDE 0.9 % IV SOLN: 450.0000 mg | Freq: Once | INTRAVENOUS | Status: DC

## 2019-08-31 MED ORDER — HEPARIN SOD (PORK) LOCK FLUSH 100 UNIT/ML IV SOLN
500.0000 [IU] | Freq: Once | INTRAVENOUS | Status: AC | PRN
  Administered 2019-08-31: 500 [IU]
  Filled 2019-08-31: qty 5

## 2019-08-31 MED ORDER — DIPHENHYDRAMINE HCL 50 MG/ML IJ SOLN
25.0000 mg | Freq: Once | INTRAMUSCULAR | Status: AC
  Administered 2019-08-31: 25 mg via INTRAVENOUS

## 2019-08-31 MED ORDER — SODIUM CHLORIDE 0.9% FLUSH
10.0000 mL | INTRAVENOUS | Status: DC | PRN
  Administered 2019-08-31: 10 mL via INTRAVENOUS
  Filled 2019-08-31: qty 10

## 2019-08-31 MED ORDER — SODIUM CHLORIDE 0.9 % IV SOLN
510.0000 mg | Freq: Once | INTRAVENOUS | Status: AC
  Administered 2019-08-31: 510 mg via INTRAVENOUS
  Filled 2019-08-31: qty 51

## 2019-08-31 MED ORDER — SODIUM CHLORIDE 0.9 % IV SOLN
Freq: Once | INTRAVENOUS | Status: AC
  Administered 2019-08-31: 09:00:00 via INTRAVENOUS
  Filled 2019-08-31: qty 250

## 2019-08-31 MED ORDER — SODIUM CHLORIDE 0.9% FLUSH
10.0000 mL | INTRAVENOUS | Status: DC | PRN
  Administered 2019-08-31: 10 mL
  Filled 2019-08-31: qty 10

## 2019-08-31 MED ORDER — SODIUM CHLORIDE 0.9 % IV SOLN
140.0000 mg/m2 | Freq: Once | INTRAVENOUS | Status: AC
  Administered 2019-08-31: 258 mg via INTRAVENOUS
  Filled 2019-08-31: qty 43

## 2019-08-31 NOTE — Patient Instructions (Signed)
[facility] Discharge Instructions for Patients Receiving Chemotherapy  Today you received the following chemotherapy agents Paclitaxel & Carboplatin  To help prevent nausea and vomiting after your treatment, we encourage you to take your nausea medication as directed.   If you develop nausea and vomiting that is not controlled by your nausea medication, call the clinic.   BELOW ARE SYMPTOMS THAT SHOULD BE REPORTED IMMEDIATELY:  *FEVER GREATER THAN 100.5 F  *CHILLS WITH OR WITHOUT FEVER  NAUSEA AND VOMITING THAT IS NOT CONTROLLED WITH YOUR NAUSEA MEDICATION  *UNUSUAL SHORTNESS OF BREATH  *UNUSUAL BRUISING OR BLEEDING  TENDERNESS IN MOUTH AND THROAT WITH OR WITHOUT PRESENCE OF ULCERS  *URINARY PROBLEMS  *BOWEL PROBLEMS  UNUSUAL RASH Items with * indicate a potential emergency and should be followed up as soon as possible.  Feel free to call the clinic should you have any questions or concerns. The clinic phone number is [phone].  Please show the CHEMO ALERT CARD at check-in to the Emergency Department and triage nurse.  Paclitaxel injection What is this medicine? PACLITAXEL (PAK li TAX el) is a chemotherapy drug. It targets fast dividing cells, like cancer cells, and causes these cells to die. This medicine is used to treat ovarian cancer, breast cancer, lung cancer, Kaposi's sarcoma, and other cancers. This medicine may be used for other purposes; ask your health care provider or pharmacist if you have questions. COMMON BRAND NAME(S): Onxol, Taxol What should I tell my health care provider before I take this medicine? They need to know if you have any of these conditions:  history of irregular heartbeat  liver disease  low blood counts, like low white cell, platelet, or red cell counts  lung or breathing disease, like asthma  tingling of the fingers or toes, or other nerve disorder  an unusual or allergic reaction to paclitaxel, alcohol,  polyoxyethylated castor oil, other chemotherapy, other medicines, foods, dyes, or preservatives  pregnant or trying to get pregnant  breast-feeding How should I use this medicine? This drug is given as an infusion into a vein. It is administered in a hospital or clinic by a specially trained health care professional. Talk to your pediatrician regarding the use of this medicine in children. Special care may be needed. Overdosage: If you think you have taken too much of this medicine contact a poison control center or emergency room at once. NOTE: This medicine is only for you. Do not share this medicine with others. What if I miss a dose? It is important not to miss your dose. Call your doctor or health care professional if you are unable to keep an appointment. What may interact with this medicine? Do not take this medicine with any of the following medications:  disulfiram  metronidazole This medicine may also interact with the following medications:  antiviral medicines for hepatitis, HIV or AIDS  certain antibiotics like erythromycin and clarithromycin  certain medicines for fungal infections like ketoconazole and itraconazole  certain medicines for seizures like carbamazepine, phenobarbital, phenytoin  gemfibrozil  nefazodone  rifampin  St. John's wort This list may not describe all possible interactions. Give your health care provider a list of all the medicines, herbs, non-prescription drugs, or dietary supplements you use. Also tell them if you smoke, drink alcohol, or use illegal drugs. Some items may interact with your medicine. What should I watch for while using this medicine? Your condition will be monitored carefully while you are receiving this medicine. You will need important   blood work done while you are taking this medicine. This medicine can cause serious allergic reactions. To reduce your risk you will need to take other medicine(s) before treatment with this  medicine. If you experience allergic reactions like skin rash, itching or hives, swelling of the face, lips, or tongue, tell your doctor or health care professional right away. In some cases, you may be given additional medicines to help with side effects. Follow all directions for their use. This drug may make you feel generally unwell. This is not uncommon, as chemotherapy can affect healthy cells as well as cancer cells. Report any side effects. Continue your course of treatment even though you feel ill unless your doctor tells you to stop. Call your doctor or health care professional for advice if you get a fever, chills or sore throat, or other symptoms of a cold or flu. Do not treat yourself. This drug decreases your body's ability to fight infections. Try to avoid being around people who are sick. This medicine may increase your risk to bruise or bleed. Call your doctor or health care professional if you notice any unusual bleeding. Be careful brushing and flossing your teeth or using a toothpick because you may get an infection or bleed more easily. If you have any dental work done, tell your dentist you are receiving this medicine. Avoid taking products that contain aspirin, acetaminophen, ibuprofen, naproxen, or ketoprofen unless instructed by your doctor. These medicines may hide a fever. Do not become pregnant while taking this medicine. Women should inform their doctor if they wish to become pregnant or think they might be pregnant. There is a potential for serious side effects to an unborn child. Talk to your health care professional or pharmacist for more information. Do not breast-feed an infant while taking this medicine. Men are advised not to father a child while receiving this medicine. This product may contain alcohol. Ask your pharmacist or healthcare provider if this medicine contains alcohol. Be sure to tell all healthcare providers you are taking this medicine. Certain medicines,  like metronidazole and disulfiram, can cause an unpleasant reaction when taken with alcohol. The reaction includes flushing, headache, nausea, vomiting, sweating, and increased thirst. The reaction can last from 30 minutes to several hours. What side effects may I notice from receiving this medicine? Side effects that you should report to your doctor or health care professional as soon as possible:  allergic reactions like skin rash, itching or hives, swelling of the face, lips, or tongue  breathing problems  changes in vision  fast, irregular heartbeat  high or low blood pressure  mouth sores  pain, tingling, numbness in the hands or feet  signs of decreased platelets or bleeding - bruising, pinpoint red spots on the skin, black, tarry stools, blood in the urine  signs of decreased red blood cells - unusually weak or tired, feeling faint or lightheaded, falls  signs of infection - fever or chills, cough, sore throat, pain or difficulty passing urine  signs and symptoms of liver injury like dark yellow or brown urine; general ill feeling or flu-like symptoms; light-colored stools; loss of appetite; nausea; right upper belly pain; unusually weak or tired; yellowing of the eyes or skin  swelling of the ankles, feet, hands  unusually slow heartbeat Side effects that usually do not require medical attention (report to your doctor or health care professional if they continue or are bothersome):  diarrhea  hair loss  loss of appetite  muscle or joint   pain  nausea, vomiting  pain, redness, or irritation at site where injected  tiredness This list may not describe all possible side effects. Call your doctor for medical advice about side effects. You may report side effects to FDA at 1-800-FDA-1088. Where should I keep my medicine? This drug is given in a hospital or clinic and will not be stored at home. NOTE: This sheet is a summary. It may not cover all possible information.  If you have questions about this medicine, talk to your doctor, pharmacist, or health care provider.  2020 Elsevier/Gold Standard (2017-07-08 13:14:55)  Carboplatin injection What is this medicine? CARBOPLATIN (KAR boe pla tin) is a chemotherapy drug. It targets fast dividing cells, like cancer cells, and causes these cells to die. This medicine is used to treat ovarian cancer and many other cancers. This medicine may be used for other purposes; ask your health care provider or pharmacist if you have questions. COMMON BRAND NAME(S): Paraplatin What should I tell my health care provider before I take this medicine? They need to know if you have any of these conditions:  blood disorders  hearing problems  kidney disease  recent or ongoing radiation therapy  an unusual or allergic reaction to carboplatin, cisplatin, other chemotherapy, other medicines, foods, dyes, or preservatives  pregnant or trying to get pregnant  breast-feeding How should I use this medicine? This drug is usually given as an infusion into a vein. It is administered in a hospital or clinic by a specially trained health care professional. Talk to your pediatrician regarding the use of this medicine in children. Special care may be needed. Overdosage: If you think you have taken too much of this medicine contact a poison control center or emergency room at once. NOTE: This medicine is only for you. Do not share this medicine with others. What if I miss a dose? It is important not to miss a dose. Call your doctor or health care professional if you are unable to keep an appointment. What may interact with this medicine?  medicines for seizures  medicines to increase blood counts like filgrastim, pegfilgrastim, sargramostim  some antibiotics like amikacin, gentamicin, neomycin, streptomycin, tobramycin  vaccines Talk to your doctor or health care professional before taking any of these  medicines:  acetaminophen  aspirin  ibuprofen  ketoprofen  naproxen This list may not describe all possible interactions. Give your health care provider a list of all the medicines, herbs, non-prescription drugs, or dietary supplements you use. Also tell them if you smoke, drink alcohol, or use illegal drugs. Some items may interact with your medicine. What should I watch for while using this medicine? Your condition will be monitored carefully while you are receiving this medicine. You will need important blood work done while you are taking this medicine. This drug may make you feel generally unwell. This is not uncommon, as chemotherapy can affect healthy cells as well as cancer cells. Report any side effects. Continue your course of treatment even though you feel ill unless your doctor tells you to stop. In some cases, you may be given additional medicines to help with side effects. Follow all directions for their use. Call your doctor or health care professional for advice if you get a fever, chills or sore throat, or other symptoms of a cold or flu. Do not treat yourself. This drug decreases your body's ability to fight infections. Try to avoid being around people who are sick. This medicine may increase your risk to bruise  or bleed. Call your doctor or health care professional if you notice any unusual bleeding. Be careful brushing and flossing your teeth or using a toothpick because you may get an infection or bleed more easily. If you have any dental work done, tell your dentist you are receiving this medicine. Avoid taking products that contain aspirin, acetaminophen, ibuprofen, naproxen, or ketoprofen unless instructed by your doctor. These medicines may hide a fever. Do not become pregnant while taking this medicine. Women should inform their doctor if they wish to become pregnant or think they might be pregnant. There is a potential for serious side effects to an unborn child. Talk  to your health care professional or pharmacist for more information. Do not breast-feed an infant while taking this medicine. What side effects may I notice from receiving this medicine? Side effects that you should report to your doctor or health care professional as soon as possible:  allergic reactions like skin rash, itching or hives, swelling of the face, lips, or tongue  signs of infection - fever or chills, cough, sore throat, pain or difficulty passing urine  signs of decreased platelets or bleeding - bruising, pinpoint red spots on the skin, black, tarry stools, nosebleeds  signs of decreased red blood cells - unusually weak or tired, fainting spells, lightheadedness  breathing problems  changes in hearing  changes in vision  chest pain  high blood pressure  low blood counts - This drug may decrease the number of white blood cells, red blood cells and platelets. You may be at increased risk for infections and bleeding.  nausea and vomiting  pain, swelling, redness or irritation at the injection site  pain, tingling, numbness in the hands or feet  problems with balance, talking, walking  trouble passing urine or change in the amount of urine Side effects that usually do not require medical attention (report to your doctor or health care professional if they continue or are bothersome):  hair loss  loss of appetite  metallic taste in the mouth or changes in taste This list may not describe all possible side effects. Call your doctor for medical advice about side effects. You may report side effects to FDA at 1-800-FDA-1088. Where should I keep my medicine? This drug is given in a hospital or clinic and will not be stored at home. NOTE: This sheet is a summary. It may not cover all possible information. If you have questions about this medicine, talk to your doctor, pharmacist, or health care provider.  2020 Elsevier/Gold Standard (2008-02-09 14:38:05)

## 2019-09-01 LAB — CA 125: Cancer Antigen (CA) 125: 37.3 U/mL (ref 0.0–38.1)

## 2019-09-08 ENCOUNTER — Ambulatory Visit: Payer: BC Managed Care – PPO | Admitting: Internal Medicine

## 2019-09-13 ENCOUNTER — Ambulatory Visit (INDEPENDENT_AMBULATORY_CARE_PROVIDER_SITE_OTHER): Payer: BC Managed Care – PPO | Admitting: Internal Medicine

## 2019-09-13 ENCOUNTER — Encounter: Payer: Self-pay | Admitting: Internal Medicine

## 2019-09-13 ENCOUNTER — Other Ambulatory Visit: Payer: Self-pay

## 2019-09-13 DIAGNOSIS — E039 Hypothyroidism, unspecified: Secondary | ICD-10-CM

## 2019-09-13 DIAGNOSIS — K219 Gastro-esophageal reflux disease without esophagitis: Secondary | ICD-10-CM | POA: Diagnosis not present

## 2019-09-13 DIAGNOSIS — I10 Essential (primary) hypertension: Secondary | ICD-10-CM | POA: Diagnosis not present

## 2019-09-13 DIAGNOSIS — R1032 Left lower quadrant pain: Secondary | ICD-10-CM | POA: Diagnosis not present

## 2019-09-13 DIAGNOSIS — C5702 Malignant neoplasm of left fallopian tube: Secondary | ICD-10-CM

## 2019-09-13 DIAGNOSIS — F329 Major depressive disorder, single episode, unspecified: Secondary | ICD-10-CM

## 2019-09-13 DIAGNOSIS — F32A Depression, unspecified: Secondary | ICD-10-CM

## 2019-09-13 DIAGNOSIS — E7800 Pure hypercholesterolemia, unspecified: Secondary | ICD-10-CM

## 2019-09-13 NOTE — Progress Notes (Signed)
Patient ID: Anna [name], female   DOB: Jul 29, 1956, 63 y.o.   MRN: [id number]   Virtual Visit via video Note  This visit type was conducted due to national recommendations for restrictions regarding the COVID-19 pandemic (e.g. social distancing).  This format is felt to be most appropriate for this patient at this time.  All issues noted in this document were discussed and addressed.  No physical exam was performed (except for noted visual exam findings with Video Visits).   I connected with Tonique Biegel by a video enabled telemedicine application  and verified that I am speaking with the correct person using two identifiers. Location patient: home Location provider: work  Persons participating in the virtual visit: patient, provider  I discussed the limitations, risks, security and privacy concerns of performing an evaluation and management service by video and the availability of in person appointments.  The patient expressed understanding and agreed to proceed.   Reason for visit: scheduled follow up.   HPI: She has been followed by Dr Benay Spice for f/u of fallopian tube cancer.  Recently noticed elevation in CA 125 and had f/u CT that revealed tumor spread to the peritoneum and lymph nodes.  Received chemo 08/31/19.  Some previous nausea, but reports no nausea or vomiting now.  No chest pain.  Breathing stable.  No headache.  Eating.  No abdominal pain now.  Bowels doing ok if she takes a stool softener.  [place].  Handling stress.  Helps to get away.  Taking wellbutrin.  Stable.  Blood pressure appears to be doing well.     ROS: See pertinent positives and negatives per HPI.  Past Medical History:  Diagnosis Date   Anxiety    Arthritis    Depression    Diverticulosis    Fallopian tube carcinoma, left ([place]) 2018   Family history of adverse reaction to anesthesia    Mother has extreme [place] with anesthesia   Family history of breast  cancer    Frequent headaches    H/O   Genetic testing 11/07/2017   GERD (gastroesophageal reflux disease)    Heart murmur    Hx of   History of chicken pox    Hypercholesterolemia    Hypertension    Hypothyroidism    Pneumonia    Sleep apnea    No longer wears cpap    Past Surgical History:  Procedure Laterality Date   ABDOMINAL HYSTERECTOMY     Dr. Denman George 07/17/17   [place] Bilateral 2008   Lasix eye srg   IR IMAGING GUIDED PORT INSERTION  08/26/2019   LAPAROSCOPY N/A 07/17/2017   Procedure: LAPAROSCOPY DIAGNOSTIC;  Surgeon: Everitt Amber, MD;  Location: [facility];  Service: Gynecology;  Laterality: N/A;   LAPAROTOMY N/A 07/17/2017   Procedure: EXPLORATORY LAPAROTOMY;  Surgeon: Everitt Amber, MD;  Location: [facility];  Service: Gynecology;  Laterality: N/A;   OMENTECTOMY N/A 07/17/2017   Procedure: OMENTECTOMY WITH RADICAL TUMOR DEBULKING;  Surgeon: Everitt Amber, MD;  Location: [facility];  Service: Gynecology;  Laterality: N/A;   TUBAL LIGATION  1985    Family History  Problem Relation Age of Onset   Hyperlipidemia Mother    Hypertension Mother    Alzheimer's disease Mother    Arthritis Father    Hyperlipidemia Father    Heart disease Father  first MI age 5   Hypertension Father    Hyperlipidemia Brother    Heart disease Brother        heart disease dx at a youg age   Hypertension Brother    Colon polyps Brother    Alzheimer's disease Maternal Aunt    Hyperlipidemia Maternal Uncle    Heart disease Maternal Uncle    Sudden death Maternal Uncle 59       massive heart attack in doctors office   Arthritis Maternal Grandmother    Hyperlipidemia Maternal Grandmother    Stroke Maternal Grandmother    Hypertension Maternal Grandmother    Alzheimer's disease Maternal Grandmother    Alcohol abuse Maternal Grandfather    Hypertension Maternal  Grandfather    Diabetes Maternal Grandfather    Cancer Paternal Grandmother        Ovarian   Hypertension Paternal Grandmother    Alcohol abuse Paternal Grandfather    Hyperlipidemia Paternal Grandfather    Heart disease Paternal Grandfather    Hypertension Paternal Grandfather    Breast cancer Cousin        pat cousin   Colon cancer Neg Hx    Rectal cancer Neg Hx    Stomach cancer Neg Hx     SOCIAL HX: reviewed.    Current Outpatient Medications:    acyclovir (ZOVIRAX) 400 MG tablet, TAKE 1 TABLET THREE TIMES A DAY FOR 5 DAYS AS NEEDED FOR FLARES (Patient not taking: Reported on 05/06/2019), Disp: 90 tablet, Rfl: 0   buPROPion (WELLBUTRIN XL) 300 MG 24 hr tablet, Take 1 tablet (300 mg total) by mouth daily., Disp: 90 tablet, Rfl: 1   EPINEPHrine 0.3 mg/0.3 mL IJ SOAJ injection, Inject 0.3 mLs (0.3 mg total) into the muscle as needed (for anaphylaxis). (Patient not taking: Reported on 05/06/2019), Disp: 3 Device, Rfl: 1   EUTHYROX 88 MCG tablet, TAKE 1 TABLET BY MOUTH ONCE DAILY BEFORE BREAKFAST, Disp: 90 tablet, Rfl: 0   ibuprofen (ADVIL,MOTRIN) 200 MG tablet, Take 200 mg by mouth every 6 (six) hours as needed., Disp: , Rfl:    lidocaine-prilocaine (EMLA) cream, Apply 1 application topically as directed. Apply 1 hour prior to port stick and cover with plastic wrap, Disp: 30 g, Rfl: 3   lisinopril (ZESTRIL) 20 MG tablet, Take 1 tablet (20 mg total) by mouth daily., Disp: 90 tablet, Rfl: 1   Multiple Vitamin (MULTIVITAMIN WITH MINERALS) TABS tablet, Take 1 tablet by mouth daily., Disp: , Rfl:    omeprazole (PRILOSEC) 20 MG capsule, TAKE 1 CAPSULE TWICE A DAY BEFORE MEALS, Disp: 180 capsule, Rfl: 1   ondansetron (ZOFRAN) 8 MG tablet, Take 1 tablet (8 mg total) by mouth every 8 (eight) hours as needed for nausea or vomiting., Disp: 30 tablet, Rfl: 2   prochlorperazine (COMPAZINE) 10 MG tablet, Take 1 tablet (10 mg total) by mouth every 6 (six) hours as needed., Disp: 60  tablet, Rfl: 1   rosuvastatin (CRESTOR) 5 MG tablet, Take 1 tablet by mouth once daily, Disp: 90 tablet, Rfl: 0  EXAM:  GENERAL: alert, oriented, appears well and in no acute distress  HEENT: atraumatic, conjunttiva clear, no obvious abnormalities on inspection of external nose and ears  NECK: normal movements of the head and neck  LUNGS: on inspection no signs of respiratory distress, breathing rate appears normal, no obvious gross SOB, gasping or wheezing  CV: no obvious cyanosis  PSYCH/NEURO: pleasant and cooperative, no obvious depression or anxiety, speech and thought processing grossly intact  ASSESSMENT AND PLAN:  Discussed the following assessment and plan:  Abdominal pain Has a history of fallopian tube cancer.  Found recently to have tumor spread.  Receiving chemo now.  No pain now.  Follow.   Depression Overall doing well.  On wellbutrin.  Has good support.   Essential hypertension, benign Blood pressure appears to be doing well.  Follow pressures and metabolic panel.   GERD (gastroesophageal reflux disease) Controlled. On omeprazole.   Hypercholesterolemia On crestor.  Low cholesterol diet and exercise.  Follow lipid panel and liver function tests.   Hypothyroidism On thyroid replacement.  Follow tsh.   Malignant neoplasm of fallopian tube ([place]) Followed by oncology.  Recent elevation in CA 125 and CT revealed progression of disease.  Receiving chemo now.  Follow.      I discussed the assessment and treatment plan with the patient. The patient was provided an opportunity to ask questions and all were answered. The patient agreed with the plan and demonstrated an understanding of the instructions.   The patient was advised to call back or seek an in-person evaluation if the symptoms worsen or if the condition fails to improve as anticipated.   Einar Pheasant, MD

## 2019-09-19 ENCOUNTER — Other Ambulatory Visit: Payer: Self-pay | Admitting: Oncology

## 2019-09-19 ENCOUNTER — Encounter: Payer: Self-pay | Admitting: Internal Medicine

## 2019-09-19 NOTE — Assessment & Plan Note (Signed)
Followed by oncology.  Recent elevation in CA 125 and CT revealed progression of disease.  Receiving chemo now.  Follow.

## 2019-09-19 NOTE — Assessment & Plan Note (Signed)
On crestor.  Low cholesterol diet and exercise.  Follow lipid panel and liver function tests.   

## 2019-09-19 NOTE — Assessment & Plan Note (Signed)
On thyroid replacement.  Follow tsh.  

## 2019-09-19 NOTE — Assessment & Plan Note (Signed)
Has a history of fallopian tube cancer.  Found recently to have tumor spread.  Receiving chemo now.  No pain now.  Follow.

## 2019-09-19 NOTE — Assessment & Plan Note (Signed)
Blood pressure appears to be doing well.  Follow pressures and metabolic panel.

## 2019-09-19 NOTE — Assessment & Plan Note (Signed)
Controlled.  On omeprazole.   

## 2019-09-19 NOTE — Assessment & Plan Note (Signed)
Overall doing well.  On wellbutrin.  Has good support.

## 2019-09-21 ENCOUNTER — Inpatient Hospital Stay: Payer: BC Managed Care – PPO

## 2019-09-21 ENCOUNTER — Inpatient Hospital Stay: Payer: BC Managed Care – PPO | Attending: Oncology | Admitting: Oncology

## 2019-09-21 ENCOUNTER — Other Ambulatory Visit: Payer: Self-pay

## 2019-09-21 VITALS — BP 118/89 | HR 90 | Temp 97.3°F | Resp 18 | Ht 64.0 in | Wt 177.5 lb

## 2019-09-21 VITALS — BP 112/79 | HR 86

## 2019-09-21 DIAGNOSIS — R0781 Pleurodynia: Secondary | ICD-10-CM | POA: Insufficient documentation

## 2019-09-21 DIAGNOSIS — C5702 Malignant neoplasm of left fallopian tube: Secondary | ICD-10-CM

## 2019-09-21 DIAGNOSIS — Z5111 Encounter for antineoplastic chemotherapy: Secondary | ICD-10-CM | POA: Diagnosis not present

## 2019-09-21 DIAGNOSIS — F329 Major depressive disorder, single episode, unspecified: Secondary | ICD-10-CM | POA: Insufficient documentation

## 2019-09-21 DIAGNOSIS — Z95828 Presence of other vascular implants and grafts: Secondary | ICD-10-CM

## 2019-09-21 DIAGNOSIS — E039 Hypothyroidism, unspecified: Secondary | ICD-10-CM | POA: Insufficient documentation

## 2019-09-21 LAB — CBC WITH DIFFERENTIAL (CANCER CENTER ONLY)
Abs Immature Granulocytes: 0.04 10*3/uL (ref 0.00–0.07)
Basophils Absolute: 0 10*3/uL (ref 0.0–0.1)
Basophils Relative: 1 %
Eosinophils Absolute: 0.1 10*3/uL (ref 0.0–0.5)
Eosinophils Relative: 1 %
HCT: 33.4 % — ABNORMAL LOW (ref 36.0–46.0)
Hemoglobin: 11.1 g/dL — ABNORMAL LOW (ref 12.0–15.0)
Immature Granulocytes: 1 %
Lymphocytes Relative: 45 %
Lymphs Abs: 2.8 10*3/uL (ref 0.7–4.0)
MCH: 31.4 pg (ref 26.0–34.0)
MCHC: 33.2 g/dL (ref 30.0–36.0)
MCV: 94.4 fL (ref 80.0–100.0)
Monocytes Absolute: 0.6 10*3/uL (ref 0.1–1.0)
Monocytes Relative: 9 %
Neutro Abs: 2.7 10*3/uL (ref 1.7–7.7)
Neutrophils Relative %: 43 %
Platelet Count: 187 10*3/uL (ref 150–400)
RBC: 3.54 MIL/uL — ABNORMAL LOW (ref 3.87–5.11)
RDW: 15 % (ref 11.5–15.5)
WBC Count: 6.1 10*3/uL (ref 4.0–10.5)
nRBC: 0 % (ref 0.0–0.2)

## 2019-09-21 LAB — CMP (CANCER CENTER ONLY)
ALT: 23 U/L (ref 0–44)
AST: 16 U/L (ref 15–41)
Albumin: 3.7 g/dL (ref 3.5–5.0)
Alkaline Phosphatase: 89 U/L (ref 38–126)
Anion gap: 11 (ref 5–15)
BUN: 19 mg/dL (ref 8–23)
CO2: 22 mmol/L (ref 22–32)
Calcium: 8.6 mg/dL — ABNORMAL LOW (ref 8.9–10.3)
Chloride: 105 mmol/L (ref 98–111)
Creatinine: 0.84 mg/dL (ref 0.44–1.00)
GFR, Est AFR Am: >60 mL/min (ref >60)
GFR, Estimated: >60 mL/min (ref >60)
Glucose, Bld: 115 mg/dL — ABNORMAL HIGH (ref 70–99)
Potassium: 3.7 mmol/L (ref 3.5–5.1)
Sodium: 138 mmol/L (ref 135–145)
Total Bilirubin: 0.3 mg/dL (ref 0.3–1.2)
Total Protein: 7 g/dL (ref 6.5–8.1)

## 2019-09-21 LAB — TOTAL PROTEIN, URINE DIPSTICK: Protein, ur: NEGATIVE mg/dL

## 2019-09-21 MED ORDER — FAMOTIDINE IN NACL 20-0.9 MG/50ML-% IV SOLN
INTRAVENOUS | Status: AC
  Filled 2019-09-21: qty 50

## 2019-09-21 MED ORDER — HEPARIN SOD (PORK) LOCK FLUSH 100 UNIT/ML IV SOLN
500.0000 [IU] | Freq: Once | INTRAVENOUS | Status: AC | PRN
  Administered 2019-09-21: 500 [IU]
  Filled 2019-09-21: qty 5

## 2019-09-21 MED ORDER — DIPHENHYDRAMINE HCL 50 MG/ML IJ SOLN
INTRAMUSCULAR | Status: AC
  Filled 2019-09-21: qty 1

## 2019-09-21 MED ORDER — SODIUM CHLORIDE 0.9% FLUSH
10.0000 mL | INTRAVENOUS | Status: DC | PRN
  Administered 2019-09-21: 10 mL
  Filled 2019-09-21: qty 10

## 2019-09-21 MED ORDER — PALONOSETRON HCL INJECTION 0.25 MG/5ML
INTRAVENOUS | Status: AC
  Filled 2019-09-21: qty 5

## 2019-09-21 MED ORDER — SODIUM CHLORIDE 0.9 % IV SOLN
15.0000 mg/kg | Freq: Once | INTRAVENOUS | Status: AC
  Administered 2019-09-21: 1200 mg via INTRAVENOUS
  Filled 2019-09-21: qty 48

## 2019-09-21 MED ORDER — PALONOSETRON HCL INJECTION 0.25 MG/5ML
0.2500 mg | Freq: Once | INTRAVENOUS | Status: AC
  Administered 2019-09-21: 10:00:00 0.25 mg via INTRAVENOUS

## 2019-09-21 MED ORDER — SODIUM CHLORIDE 0.9 % IV SOLN
20.0000 mg | Freq: Once | INTRAVENOUS | Status: DC
  Filled 2019-09-21: qty 2

## 2019-09-21 MED ORDER — SODIUM CHLORIDE 0.9 % IV SOLN
140.0000 mg/m2 | Freq: Once | INTRAVENOUS | Status: AC
  Administered 2019-09-21: 12:00:00 270 mg via INTRAVENOUS
  Filled 2019-09-21: qty 45

## 2019-09-21 MED ORDER — SODIUM CHLORIDE 0.9 % IV SOLN
Freq: Once | INTRAVENOUS | Status: AC
  Administered 2019-09-21: 10:00:00 via INTRAVENOUS
  Filled 2019-09-21: qty 250

## 2019-09-21 MED ORDER — FAMOTIDINE IN NACL 20-0.9 MG/50ML-% IV SOLN
20.0000 mg | Freq: Once | INTRAVENOUS | Status: AC
  Administered 2019-09-21: 20 mg via INTRAVENOUS

## 2019-09-21 MED ORDER — DIPHENHYDRAMINE HCL 50 MG/ML IJ SOLN
25.0000 mg | Freq: Once | INTRAMUSCULAR | Status: AC
  Administered 2019-09-21: 10:00:00 25 mg via INTRAVENOUS

## 2019-09-21 MED ORDER — SODIUM CHLORIDE 0.9 % IV SOLN
20.0000 mg | Freq: Once | INTRAVENOUS | Status: AC
  Administered 2019-09-21: 20 mg via INTRAVENOUS
  Filled 2019-09-21: qty 20

## 2019-09-21 MED ORDER — SODIUM CHLORIDE 0.9% FLUSH
10.0000 mL | INTRAVENOUS | Status: DC | PRN
  Administered 2019-09-21: 10 mL via INTRAVENOUS
  Filled 2019-09-21: qty 10

## 2019-09-21 MED ORDER — SODIUM CHLORIDE 0.9 % IV SOLN
510.7500 mg | Freq: Once | INTRAVENOUS | Status: AC
  Administered 2019-09-21: 15:00:00 510 mg via INTRAVENOUS
  Filled 2019-09-21: qty 51

## 2019-09-21 NOTE — Patient Instructions (Signed)

## 2019-09-21 NOTE — Patient Instructions (Signed)
Bevacizumab injection What is this medicine? BEVACIZUMAB (be va SIZ yoo mab) is a monoclonal antibody. It is used to treat many types of cancer. This medicine may be used for other purposes; ask your health care provider or pharmacist if you have questions. COMMON BRAND NAME(S): Avastin, MVASI, Zirabev What should I tell my health care provider before I take this medicine? They need to know if you have any of these conditions:  diabetes  heart disease  high blood pressure  history of coughing up blood  prior anthracycline chemotherapy (e.g., doxorubicin, daunorubicin, epirubicin)  recent or ongoing radiation therapy  recent or planning to have surgery  stroke  an unusual or allergic reaction to bevacizumab, hamster proteins, mouse proteins, other medicines, foods, dyes, or preservatives  pregnant or trying to get pregnant  breast-feeding How should I use this medicine? This medicine is for infusion into a vein. It is given by a health care professional in a hospital or clinic setting. Talk to your pediatrician regarding the use of this medicine in children. Special care may be needed. Overdosage: If you think you have taken too much of this medicine contact a poison control center or emergency room at once. NOTE: This medicine is only for you. Do not share this medicine with others. What if I miss a dose? It is important not to miss your dose. Call your doctor or health care professional if you are unable to keep an appointment. What may interact with this medicine? Interactions are not expected. This list may not describe all possible interactions. Give your health care provider a list of all the medicines, herbs, non-prescription drugs, or dietary supplements you use. Also tell them if you smoke, drink alcohol, or use illegal drugs. Some items may interact with your medicine. What should I watch for while using this medicine? Your condition will be monitored carefully while  you are receiving this medicine. You will need important blood work and urine testing done while you are taking this medicine. This medicine may increase your risk to bruise or bleed. Call your doctor or health care professional if you notice any unusual bleeding. This medicine should be started at least 28 days following major surgery and the site of the surgery should be totally healed. Check with your doctor before scheduling dental work or surgery while you are receiving this treatment. Talk to your doctor if you have recently had surgery or if you have a wound that has not healed. Do not become pregnant while taking this medicine or for 6 months after stopping it. Women should inform their doctor if they wish to become pregnant or think they might be pregnant. There is a potential for serious side effects to an unborn child. Talk to your health care professional or pharmacist for more information. Do not breast-feed an infant while taking this medicine and for 6 months after the last dose. This medicine has caused ovarian failure in some women. This medicine may interfere with the ability to have a child. You should talk to your doctor or health care professional if you are concerned about your fertility. What side effects may I notice from receiving this medicine? Side effects that you should report to your doctor or health care professional as soon as possible:  allergic reactions like skin rash, itching or hives, swelling of the face, lips, or tongue  chest pain or chest tightness  chills  coughing up blood  high fever  seizures  severe constipation  signs and symptoms   of bleeding such as bloody or black, tarry stools; red or dark-brown urine; spitting up blood or brown material that looks like coffee grounds; red spots on the skin; unusual bruising or bleeding from the eye, gums, or nose  signs and symptoms of a blood clot such as breathing problems; chest pain; severe, sudden  headache; pain, swelling, warmth in the leg  signs and symptoms of a stroke like changes in vision; confusion; trouble speaking or understanding; severe headaches; sudden numbness or weakness of the face, arm or leg; trouble walking; dizziness; loss of balance or coordination  stomach pain  sweating  swelling of legs or ankles  vomiting  weight gain Side effects that usually do not require medical attention (report to your doctor or health care professional if they continue or are bothersome):  back pain  changes in taste  decreased appetite  dry skin  nausea  tiredness This list may not describe all possible side effects. Call your doctor for medical advice about side effects. You may report side effects to FDA at 1-800-FDA-1088. Where should I keep my medicine? This drug is given in a hospital or clinic and will not be stored at home. NOTE: This sheet is a summary. It may not cover all possible information. If you have questions about this medicine, talk to your doctor, pharmacist, or health care provider.  2020 Elsevier/Gold Standard (2016-11-01 14:33:29) Paclitaxel injection What is this medicine? PACLITAXEL (PAK li TAX el) is a chemotherapy drug. It targets fast dividing cells, like cancer cells, and causes these cells to die. This medicine is used to treat ovarian cancer, breast cancer, lung cancer, Kaposi's sarcoma, and other cancers. This medicine may be used for other purposes; ask your health care provider or pharmacist if you have questions. COMMON BRAND NAME(S): Onxol, Taxol What should I tell my health care provider before I take this medicine? They need to know if you have any of these conditions:  history of irregular heartbeat  liver disease  low blood counts, like low white cell, platelet, or red cell counts  lung or breathing disease, like asthma  tingling of the fingers or toes, or other nerve disorder  an unusual or allergic reaction to  paclitaxel, alcohol, polyoxyethylated castor oil, other chemotherapy, other medicines, foods, dyes, or preservatives  pregnant or trying to get pregnant  breast-feeding How should I use this medicine? This drug is given as an infusion into a vein. It is administered in a hospital or clinic by a specially trained health care professional. Talk to your pediatrician regarding the use of this medicine in children. Special care may be needed. Overdosage: If you think you have taken too much of this medicine contact a poison control center or emergency room at once. NOTE: This medicine is only for you. Do not share this medicine with others. What if I miss a dose? It is important not to miss your dose. Call your doctor or health care professional if you are unable to keep an appointment. What may interact with this medicine? Do not take this medicine with any of the following medications:  disulfiram  metronidazole This medicine may also interact with the following medications:  antiviral medicines for hepatitis, HIV or AIDS  certain antibiotics like erythromycin and clarithromycin  certain medicines for fungal infections like ketoconazole and itraconazole  certain medicines for seizures like carbamazepine, phenobarbital, phenytoin  gemfibrozil  nefazodone  rifampin  St. John's wort This list may not describe all possible interactions. Give your health   care provider a list of all the medicines, herbs, non-prescription drugs, or dietary supplements you use. Also tell them if you smoke, drink alcohol, or use illegal drugs. Some items may interact with your medicine. What should I watch for while using this medicine? Your condition will be monitored carefully while you are receiving this medicine. You will need important blood work done while you are taking this medicine. This medicine can cause serious allergic reactions. To reduce your risk you will need to take other medicine(s)  before treatment with this medicine. If you experience allergic reactions like skin rash, itching or hives, swelling of the face, lips, or tongue, tell your doctor or health care professional right away. In some cases, you may be given additional medicines to help with side effects. Follow all directions for their use. This drug may make you feel generally unwell. This is not uncommon, as chemotherapy can affect healthy cells as well as cancer cells. Report any side effects. Continue your course of treatment even though you feel ill unless your doctor tells you to stop. Call your doctor or health care professional for advice if you get a fever, chills or sore throat, or other symptoms of a cold or flu. Do not treat yourself. This drug decreases your body's ability to fight infections. Try to avoid being around people who are sick. This medicine may increase your risk to bruise or bleed. Call your doctor or health care professional if you notice any unusual bleeding. Be careful brushing and flossing your teeth or using a toothpick because you may get an infection or bleed more easily. If you have any dental work done, tell your dentist you are receiving this medicine. Avoid taking products that contain aspirin, acetaminophen, ibuprofen, naproxen, or ketoprofen unless instructed by your doctor. These medicines may hide a fever. Do not become pregnant while taking this medicine. Women should inform their doctor if they wish to become pregnant or think they might be pregnant. There is a potential for serious side effects to an unborn child. Talk to your health care professional or pharmacist for more information. Do not breast-feed an infant while taking this medicine. Men are advised not to father a child while receiving this medicine. This product may contain alcohol. Ask your pharmacist or healthcare provider if this medicine contains alcohol. Be sure to tell all healthcare providers you are taking this  medicine. Certain medicines, like metronidazole and disulfiram, can cause an unpleasant reaction when taken with alcohol. The reaction includes flushing, headache, nausea, vomiting, sweating, and increased thirst. The reaction can last from 30 minutes to several hours. What side effects may I notice from receiving this medicine? Side effects that you should report to your doctor or health care professional as soon as possible:  allergic reactions like skin rash, itching or hives, swelling of the face, lips, or tongue  breathing problems  changes in vision  fast, irregular heartbeat  high or low blood pressure  mouth sores  pain, tingling, numbness in the hands or feet  signs of decreased platelets or bleeding - bruising, pinpoint red spots on the skin, black, tarry stools, blood in the urine  signs of decreased red blood cells - unusually weak or tired, feeling faint or lightheaded, falls  signs of infection - fever or chills, cough, sore throat, pain or difficulty passing urine  signs and symptoms of liver injury like dark yellow or brown urine; general ill feeling or flu-like symptoms; light-colored stools; loss of appetite; nausea; right   upper belly pain; unusually weak or tired; yellowing of the eyes or skin  swelling of the ankles, feet, hands  unusually slow heartbeat Side effects that usually do not require medical attention (report to your doctor or health care professional if they continue or are bothersome):  diarrhea  hair loss  loss of appetite  muscle or joint pain  nausea, vomiting  pain, redness, or irritation at site where injected  tiredness This list may not describe all possible side effects. Call your doctor for medical advice about side effects. You may report side effects to FDA at 1-800-FDA-1088. Where should I keep my medicine? This drug is given in a hospital or clinic and will not be stored at home. NOTE: This sheet is a summary. It may not  cover all possible information. If you have questions about this medicine, talk to your doctor, pharmacist, or health care provider.  2020 Elsevier/Gold Standard (2017-07-08 13:14:55) Carboplatin injection What is this medicine? CARBOPLATIN (KAR boe pla tin) is a chemotherapy drug. It targets fast dividing cells, like cancer cells, and causes these cells to die. This medicine is used to treat ovarian cancer and many other cancers. This medicine may be used for other purposes; ask your health care provider or pharmacist if you have questions. COMMON BRAND NAME(S): Paraplatin What should I tell my health care provider before I take this medicine? They need to know if you have any of these conditions:  blood disorders  hearing problems  kidney disease  recent or ongoing radiation therapy  an unusual or allergic reaction to carboplatin, cisplatin, other chemotherapy, other medicines, foods, dyes, or preservatives  pregnant or trying to get pregnant  breast-feeding How should I use this medicine? This drug is usually given as an infusion into a vein. It is administered in a hospital or clinic by a specially trained health care professional. Talk to your pediatrician regarding the use of this medicine in children. Special care may be needed. Overdosage: If you think you have taken too much of this medicine contact a poison control center or emergency room at once. NOTE: This medicine is only for you. Do not share this medicine with others. What if I miss a dose? It is important not to miss a dose. Call your doctor or health care professional if you are unable to keep an appointment. What may interact with this medicine?  medicines for seizures  medicines to increase blood counts like filgrastim, pegfilgrastim, sargramostim  some antibiotics like amikacin, gentamicin, neomycin, streptomycin, tobramycin  vaccines Talk to your doctor or health care professional before taking any of  these medicines:  acetaminophen  aspirin  ibuprofen  ketoprofen  naproxen This list may not describe all possible interactions. Give your health care provider a list of all the medicines, herbs, non-prescription drugs, or dietary supplements you use. Also tell them if you smoke, drink alcohol, or use illegal drugs. Some items may interact with your medicine. What should I watch for while using this medicine? Your condition will be monitored carefully while you are receiving this medicine. You will need important blood work done while you are taking this medicine. This drug may make you feel generally unwell. This is not uncommon, as chemotherapy can affect healthy cells as well as cancer cells. Report any side effects. Continue your course of treatment even though you feel ill unless your doctor tells you to stop. In some cases, you may be given additional medicines to help with side effects. Follow all directions   for their use. Call your doctor or health care professional for advice if you get a fever, chills or sore throat, or other symptoms of a cold or flu. Do not treat yourself. This drug decreases your body's ability to fight infections. Try to avoid being around people who are sick. This medicine may increase your risk to bruise or bleed. Call your doctor or health care professional if you notice any unusual bleeding. Be careful brushing and flossing your teeth or using a toothpick because you may get an infection or bleed more easily. If you have any dental work done, tell your dentist you are receiving this medicine. Avoid taking products that contain aspirin, acetaminophen, ibuprofen, naproxen, or ketoprofen unless instructed by your doctor. These medicines may hide a fever. Do not become pregnant while taking this medicine. Women should inform their doctor if they wish to become pregnant or think they might be pregnant. There is a potential for serious side effects to an unborn child.  Talk to your health care professional or pharmacist for more information. Do not breast-feed an infant while taking this medicine. What side effects may I notice from receiving this medicine? Side effects that you should report to your doctor or health care professional as soon as possible:  allergic reactions like skin rash, itching or hives, swelling of the face, lips, or tongue  signs of infection - fever or chills, cough, sore throat, pain or difficulty passing urine  signs of decreased platelets or bleeding - bruising, pinpoint red spots on the skin, black, tarry stools, nosebleeds  signs of decreased red blood cells - unusually weak or tired, fainting spells, lightheadedness  breathing problems  changes in hearing  changes in vision  chest pain  high blood pressure  low blood counts - This drug may decrease the number of white blood cells, red blood cells and platelets. You may be at increased risk for infections and bleeding.  nausea and vomiting  pain, swelling, redness or irritation at the injection site  pain, tingling, numbness in the hands or feet  problems with balance, talking, walking  trouble passing urine or change in the amount of urine Side effects that usually do not require medical attention (report to your doctor or health care professional if they continue or are bothersome):  hair loss  loss of appetite  metallic taste in the mouth or changes in taste This list may not describe all possible side effects. Call your doctor for medical advice about side effects. You may report side effects to FDA at 1-800-FDA-1088. Where should I keep my medicine? This drug is given in a hospital or clinic and will not be stored at home. NOTE: This sheet is a summary. It may not cover all possible information. If you have questions about this medicine, talk to your doctor, pharmacist, or health care provider.  2020 Elsevier/Gold Standard (2008-02-09 14:38:05)  

## 2019-09-21 NOTE — Progress Notes (Signed)
  [facility] OFFICE PROGRESS NOTE   Diagnosis: Fallopian tube carcinoma  INTERVAL HISTORY:   [name] completed a cycle of Taxol carboplatin on 08/31/2019.  She tolerated the treatment well.  No sign of allergic reaction.  No nausea/vomiting.  No neuropathy symptoms.  She no longer has difficulty with bowel function.  She had mild myalgias for a few days following chemotherapy.  Objective:  Vital signs in last 24 hours:  Blood pressure 118/89, pulse 90, temperature (!) 97.3 F (36.3 C), temperature source Temporal, resp. rate 18, height _0  (1.626 m), weight 177 lb 8 oz (80.5 kg), SpO2 100 %.    Limited physical examination secondary to distancing with the Covid pandemic Cardio: Regular rate and rhythm GI: No hepatosplenomegaly, no apparent ascites, no mass, nontender Vascular: No leg edema   Portacath/PICC-without erythema  Lab Results:  Lab Results  Component Value Date   WBC 6.1 09/21/2019   HGB 11.1 (L) 09/21/2019   HCT 33.4 (L) 09/21/2019   MCV 94.4 09/21/2019   PLT 187 09/21/2019   NEUTROABS 2.7 09/21/2019    CMP  Lab Results  Component Value Date   NA 138 09/21/2019   K 3.7 09/21/2019   CL 105 09/21/2019   CO2 22 09/21/2019   GLUCOSE 115 (H) 09/21/2019   BUN 19 09/21/2019   CREATININE 0.84 09/21/2019   CALCIUM 8.6 (L) 09/21/2019   PROT 7.0 09/21/2019   ALBUMIN 3.7 09/21/2019   AST 16 09/21/2019   ALT 23 09/21/2019   ALKPHOS 89 09/21/2019   BILITOT 0.3 09/21/2019   GFRNONAA >60 09/21/2019   GFRAA >60 09/21/2019     Medications: I have reviewed the patient's current medications.   Assessment/Plan: 1. Left abdomen/pelvic pain ? CT abdomen/pelvis 06/26/2017-soft tissue implants in the lower anterior peritoneum with implants at the paracolic gutters and left pelvic sidewall ? NegativeMYRIADhereditary cancer panel ? Elevated CA 125 ? CT biopsy of anterior omental mass 07/04/2017-Metastatic carcinoma consistent with a  gynecologic primary ? 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 Cycle 1 adjuvant Taxol/carboplatin 08/05/2017 ? Cycle 2 adjuvant Taxol/carboplatin 08/26/2017 ? Cycle 3 adjuvant Taxol/carboplatin 09/17/2017-Taxol dose reduced secondary to neuropathy and bone pain ? Cycle 4 adjuvant Taxol/carboplatin 10/10/2017 ? Cycle 5 adjuvant Taxol/carboplatin 10/31/2017 ? Cycle 6 adjuvant Taxol/carboplatin 11/21/2017 ? CT abdomen/pelvis 08/08/2019- narrowing of rectosigmoid colon with eccentric serosal implant, 10 mm lymph node at the superior rectal vein, left upper quadrant soft tissue implant, 9 mm precaval node, 15 mm left internal iliac node, no ascites ? Cycle 1 Taxol/carboplatin 08/31/2019 ? Cycle 2 Taxol/carboplatin/Avastin 09/21/2019  2. Depression  3. Hypothyroid  4. Family history of uterine cancer-paternal grandmother     Disposition: Ms. Kristiansen tolerated the Taxol/carboplatin well.  She will complete cycle 2 today.  Avastin will be added to this cycle.  She will return for an office visit and chemotherapy in 3 weeks.  We will follow-up on the CA-125 from today.  [name], MD  09/21/2019  9:49 AM

## 2019-09-22 ENCOUNTER — Telehealth: Payer: Self-pay | Admitting: Oncology

## 2019-09-22 ENCOUNTER — Telehealth: Payer: Self-pay | Admitting: *Deleted

## 2019-09-22 LAB — CA 125: Cancer Antigen (CA) 125: 24.2 U/mL (ref 0.0–38.1)

## 2019-09-22 NOTE — Telephone Encounter (Signed)
Called & spoke with pt to see how she did with her treatment yest.  She reports doing well & denies any symptoms.  She states she knows how to reach [place] & will call with any concerns.  She states she is familiar with possible side effects.  She was very appreciative of care she has received at the [facility].  She did have a concern about her appts & this nurse will reach out to scheduling to see if they can correct time.

## 2019-09-22 NOTE — Telephone Encounter (Signed)
-----   Message from Rolene Course, RN sent at 09/21/2019  4:04 PM EST ----- Regarding: Benay Spice 1st Tx F/U call - Zirabev 1st Tx F/U call - Noah Charon

## 2019-09-22 NOTE — Telephone Encounter (Signed)
Scheduled per los. Called and spoke with patient. Confirmed appt 

## 2019-09-26 ENCOUNTER — Other Ambulatory Visit: Payer: Self-pay | Admitting: Internal Medicine

## 2019-10-04 ENCOUNTER — Other Ambulatory Visit: Payer: Self-pay

## 2019-10-04 ENCOUNTER — Other Ambulatory Visit: Payer: Self-pay | Admitting: Medical

## 2019-10-04 ENCOUNTER — Inpatient Hospital Stay (HOSPITAL_BASED_OUTPATIENT_CLINIC_OR_DEPARTMENT_OTHER): Payer: BC Managed Care – PPO | Admitting: Medical

## 2019-10-04 ENCOUNTER — Inpatient Hospital Stay: Payer: BC Managed Care – PPO

## 2019-10-04 ENCOUNTER — Telehealth: Payer: Self-pay | Admitting: *Deleted

## 2019-10-04 ENCOUNTER — Inpatient Hospital Stay (HOSPITAL_BASED_OUTPATIENT_CLINIC_OR_DEPARTMENT_OTHER): Payer: BC Managed Care – PPO | Admitting: Oncology

## 2019-10-04 ENCOUNTER — Encounter (HOSPITAL_COMMUNITY): Payer: Self-pay

## 2019-10-04 ENCOUNTER — Ambulatory Visit (HOSPITAL_COMMUNITY)
Admission: RE | Admit: 2019-10-04 | Discharge: 2019-10-04 | Disposition: A | Discharge status: Home / Self Care | Payer: BC Managed Care – PPO | Source: Ambulatory Visit | Attending: Oncology | Admitting: Oncology

## 2019-10-04 VITALS — BP 144/73 | HR 106 | Temp 97.9°F | Resp 17 | Ht 64.0 in | Wt 173.1 lb

## 2019-10-04 DIAGNOSIS — C5700 Malignant neoplasm of unspecified fallopian tube: Secondary | ICD-10-CM

## 2019-10-04 DIAGNOSIS — Z20822 Contact with and (suspected) exposure to covid-19: Secondary | ICD-10-CM

## 2019-10-04 DIAGNOSIS — R0602 Shortness of breath: Secondary | ICD-10-CM | POA: Diagnosis not present

## 2019-10-04 DIAGNOSIS — R059 Cough, unspecified: Secondary | ICD-10-CM

## 2019-10-04 DIAGNOSIS — Z5111 Encounter for antineoplastic chemotherapy: Secondary | ICD-10-CM | POA: Diagnosis not present

## 2019-10-04 DIAGNOSIS — C5702 Malignant neoplasm of left fallopian tube: Secondary | ICD-10-CM

## 2019-10-04 DIAGNOSIS — F329 Major depressive disorder, single episode, unspecified: Secondary | ICD-10-CM | POA: Diagnosis not present

## 2019-10-04 DIAGNOSIS — R0781 Pleurodynia: Secondary | ICD-10-CM | POA: Diagnosis not present

## 2019-10-04 DIAGNOSIS — R05 Cough: Secondary | ICD-10-CM

## 2019-10-04 DIAGNOSIS — E039 Hypothyroidism, unspecified: Secondary | ICD-10-CM | POA: Diagnosis not present

## 2019-10-04 LAB — CBC WITH DIFFERENTIAL (CANCER CENTER ONLY)
Abs Immature Granulocytes: 0.01 10*3/uL (ref 0.00–0.07)
Basophils Absolute: 0 10*3/uL (ref 0.0–0.1)
Basophils Relative: 0 %
Eosinophils Absolute: 0 10*3/uL (ref 0.0–0.5)
Eosinophils Relative: 1 %
HCT: 34.8 % — ABNORMAL LOW (ref 36.0–46.0)
Hemoglobin: 11.7 g/dL — ABNORMAL LOW (ref 12.0–15.0)
Immature Granulocytes: 0 %
Lymphocytes Relative: 65 %
Lymphs Abs: 3.4 10*3/uL (ref 0.7–4.0)
MCH: 31.5 pg (ref 26.0–34.0)
MCHC: 33.6 g/dL (ref 30.0–36.0)
MCV: 93.8 fL (ref 80.0–100.0)
Monocytes Absolute: 1 10*3/uL (ref 0.1–1.0)
Monocytes Relative: 19 %
Neutro Abs: 0.8 10*3/uL — ABNORMAL LOW (ref 1.7–7.7)
Neutrophils Relative %: 15 %
Platelet Count: 219 10*3/uL (ref 150–400)
RBC: 3.71 MIL/uL — ABNORMAL LOW (ref 3.87–5.11)
RDW: 15.3 % (ref 11.5–15.5)
WBC Count: 5.2 10*3/uL (ref 4.0–10.5)
nRBC: 0 % (ref 0.0–0.2)

## 2019-10-04 MED ORDER — RIVAROXABAN (XARELTO) VTE STARTER PACK (15 & 20 MG): ORAL_TABLET | ORAL | 0 refills | Status: DC

## 2019-10-04 MED ORDER — HEPARIN SOD (PORK) LOCK FLUSH 100 UNIT/ML IV SOLN
500.0000 [IU] | Freq: Once | INTRAVENOUS | Status: AC
  Administered 2019-10-04: 500 [IU] via INTRAVENOUS
  Filled 2019-10-04: qty 5

## 2019-10-04 MED ORDER — IOHEXOL 350 MG/ML SOLN
100.0000 mL | Freq: Once | INTRAVENOUS | Status: AC | PRN
  Administered 2019-10-04: 100 mL via INTRAVENOUS

## 2019-10-04 MED ORDER — LEVOFLOXACIN 500 MG PO TABS: 500.0000 mg | ORAL_TABLET | Freq: Every day | ORAL | 0 refills | Status: DC

## 2019-10-04 MED ORDER — HEPARIN SOD (PORK) LOCK FLUSH 100 UNIT/ML IV SOLN
500.0000 [IU] | Freq: Once | INTRAVENOUS | Status: AC
  Administered 2019-10-04: 500 [IU] via INTRAVENOUS

## 2019-10-04 MED ORDER — HEPARIN SOD (PORK) LOCK FLUSH 100 UNIT/ML IV SOLN
INTRAVENOUS | Status: AC
  Filled 2019-10-04: qty 5

## 2019-10-04 MED ORDER — SODIUM CHLORIDE 0.9% FLUSH
10.0000 mL | INTRAVENOUS | Status: DC | PRN
  Administered 2019-10-04: 10 mL via INTRAVENOUS
  Filled 2019-10-04: qty 10

## 2019-10-04 MED ORDER — SODIUM CHLORIDE (PF) 0.9 % IJ SOLN
INTRAMUSCULAR | Status: AC
  Filled 2019-10-04: qty 50

## 2019-10-04 MED FILL — levoFLOXacin 500 MG TABS: 500 | 7 days supply | Qty: 7 | Fill #0

## 2019-10-04 MED FILL — XARELTO STARTER PACK: 15 & 20 | 30 days supply | Qty: 51 | Fill #0

## 2019-10-04 NOTE — Telephone Encounter (Signed)
-----   Message from Ladell Pier, MD sent at 10/04/2019  1:45 PM EST ----- Please call patient, platelets are normal, neutrophils mildly low, call for fever call if dyspnea is not improved over the next few days

## 2019-10-04 NOTE — Progress Notes (Signed)
[place] OFFICE PROGRESS NOTE   Diagnosis: Fallopian tube carcinoma  INTERVAL HISTORY:   Anna [name] returns prior to a scheduled visit.  She noted the acute onset of pleuritic left-sided chest pain and dyspnea 10/02/2019.  No cough.  No leg swelling or pain.  No trauma.  No right-sided chest pain.  She had a temperature 100.1 degrees last night.  No other fever.  No Covid exposure.  She has noted facial erythema today. She completed a cycle of Taxol/carboplatin and Avastin on 09/21/2019.  She reports tolerated chemotherapy well.  No more sign of an allergic reaction. Objective:  Vital signs in last 24 hours:  Blood pressure (!) 144/73, pulse (!) 106, temperature 97.9 F (36.6 C), temperature source Temporal, resp. rate 17, height _0  (1.626 m), weight 173 lb 1.6 oz (78.5 kg), SpO2 98 %.     Resp: End inspiratory rhonchi/rub at the left lower posterior lateral chest and at the lower anterior chest Cardio: Regular rate and rhythm GI: Nontender, no hepatosplenomegaly Vascular: No leg edema or erythema Musculoskeletal: Mild tenderness on deep palpation at the left mid posterior lateral chest wall, no mass or rash  Portacath/PICC-without erythema  Lab Results:  Lab Results  Component Value Date   WBC 6.1 09/21/2019   HGB 11.1 (L) 09/21/2019   HCT 33.4 (L) 09/21/2019   MCV 94.4 09/21/2019   PLT 187 09/21/2019   NEUTROABS 2.7 09/21/2019    CMP  Lab Results  Component Value Date   NA 138 09/21/2019   K 3.7 09/21/2019   CL 105 09/21/2019   CO2 22 09/21/2019   GLUCOSE 115 (H) 09/21/2019   BUN 19 09/21/2019   CREATININE 0.84 09/21/2019   CALCIUM 8.6 (L) 09/21/2019   PROT 7.0 09/21/2019   ALBUMIN 3.7 09/21/2019   AST 16 09/21/2019   ALT 23 09/21/2019   ALKPHOS 89 09/21/2019   BILITOT 0.3 09/21/2019   GFRNONAA >60 09/21/2019   GFRAA >60 09/21/2019    Lab Results  Component Value Date   CEA1 <1.00 06/27/2017    Lab Results  Component Value  Date   INR 0.9 08/26/2019    Imaging:  No results found.  Medications: I have reviewed the patient's current medications.   Assessment/Plan: 1. Left abdomen/pelvic pain ? CT abdomen/pelvis 06/26/2017-soft tissue implants in the lower anterior peritoneum with implants at the paracolic gutters and left pelvic sidewall ? NegativeMYRIADhereditary cancer panel ? Elevated CA 125 ? CT biopsy of anterior omental mass 07/04/2017-Metastatic carcinoma consistent with a gynecologic primary ? 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 Cycle 1 adjuvant Taxol/carboplatin 08/05/2017 ? Cycle 2 adjuvant Taxol/carboplatin 08/26/2017 ? Cycle 3 adjuvant Taxol/carboplatin 09/17/2017-Taxol dose reduced secondary to neuropathy and bone pain ? Cycle 4 adjuvant Taxol/carboplatin 10/10/2017 ? Cycle 5 adjuvant Taxol/carboplatin 10/31/2017 ? Cycle 6 adjuvant Taxol/carboplatin 11/21/2017 ? CT abdomen/pelvis 08/08/2019- narrowing of rectosigmoid colon with eccentric serosal implant, 10 mm lymph node at the superior rectal vein, left upper quadrant soft tissue implant, 9 mm precaval node, 15 mm left internal iliac node, no ascites ? Cycle 1 Taxol/carboplatin 08/31/2019 ? Cycle 2 Taxol/carboplatin/Avastin 09/21/2019  2. Depression  3. Hypothyroid  4. Family history of uterine cancer-paternal grandmother  68.  Acute onset dyspnea/pleuritic left-sided chest pain 10/02/2019  CT chest 10/04/2019-left lower lobe pulmonary emboli multifocal pneumonia lobes   Disposition: Anna [name] is now at day 14 following cycle 2 Taxol/carboplatin.   She received Avastin with the last cycle of chemotherapy.  She presents today with acute onset pleuritic left-sided chest pain and dyspnea.  A CT confirms left lower pulmonary emboli.  She also appears to have pneumonia.  I reviewed the CT images with Anna [name].  She will begin Xarelto anticoagulation today.  We discussed the risk of bleeding.  She has infiltrates in the, potentially related to pneumonia.  She does not have risks for Covid exposure, but she will undergo Covid testing.  She will begin a course of Levaquin.  Anna [name] will contact us if the dyspnea and pain are not better within the next few days.  40 minutes were spent with the patient today.  The majority of the time was used for counseling and coordination of care. [name], MD  10/04/2019  10:14 AM

## 2019-10-04 NOTE — Progress Notes (Signed)
Anna [name] has been diagnosed with bilateral pneumonia and was found to have a pulmonary emboli.  Dr. Benay Spice initially asked that the patient had no COVID-19 testing completed it was then determined that the patient was not going to be admitted.  She was instructed to present to the [facility] location for COVID-19 testing.  Sandi Mealy, MHS, PA-C Physician Assistant

## 2019-10-04 NOTE — Telephone Encounter (Signed)
Beginning Saturday, developed pain in left posterior chest with deep breath. Has now progressed to entire left chest area. She is short of breath just walking around her house. Feels a "crackle" with deep breath. Temp this am is 99.1 and was 100.1 last night. Denies cough or any potential COVID exposure. Has been staying home. Per Dr. Benay Spice: Come to office now. Patient notified and agrees.

## 2019-10-04 NOTE — Telephone Encounter (Signed)
[place] Dr. Benay Spice, called and made pt aware. Platelets were normal, neutrophils were mildly low as well. Advised pt to call for fever, or if having dyspnea is not improved over the next few days. Pt verbalized understanding.

## 2019-10-05 ENCOUNTER — Telehealth: Payer: Self-pay | Admitting: *Deleted

## 2019-10-05 LAB — NOVEL CORONAVIRUS, NAA: SARS-CoV-2, NAA: NOT DETECTED

## 2019-10-05 NOTE — Telephone Encounter (Signed)
Called to f/u on how she is feeling today. Reports some slight improvement. Very thankful for her care yesterday. She is taking the rivaroxaban and levofloxacin without trouble.

## 2019-10-10 ENCOUNTER — Other Ambulatory Visit: Payer: Self-pay | Admitting: Oncology

## 2019-10-12 ENCOUNTER — Telehealth: Payer: Self-pay | Admitting: *Deleted

## 2019-10-12 ENCOUNTER — Other Ambulatory Visit: Payer: Self-pay

## 2019-10-12 ENCOUNTER — Encounter: Payer: Self-pay | Admitting: Nurse Practitioner

## 2019-10-12 ENCOUNTER — Inpatient Hospital Stay: Payer: BC Managed Care – PPO

## 2019-10-12 ENCOUNTER — Inpatient Hospital Stay (HOSPITAL_BASED_OUTPATIENT_CLINIC_OR_DEPARTMENT_OTHER): Payer: BC Managed Care – PPO | Admitting: Nurse Practitioner

## 2019-10-12 VITALS — BP 123/72 | HR 88 | Temp 98.0°F | Resp 18 | Wt 175.1 lb

## 2019-10-12 DIAGNOSIS — C5702 Malignant neoplasm of left fallopian tube: Secondary | ICD-10-CM

## 2019-10-12 DIAGNOSIS — E039 Hypothyroidism, unspecified: Secondary | ICD-10-CM | POA: Diagnosis not present

## 2019-10-12 DIAGNOSIS — C5700 Malignant neoplasm of unspecified fallopian tube: Secondary | ICD-10-CM | POA: Diagnosis not present

## 2019-10-12 DIAGNOSIS — Z5111 Encounter for antineoplastic chemotherapy: Secondary | ICD-10-CM | POA: Diagnosis not present

## 2019-10-12 DIAGNOSIS — F329 Major depressive disorder, single episode, unspecified: Secondary | ICD-10-CM | POA: Diagnosis not present

## 2019-10-12 DIAGNOSIS — R0781 Pleurodynia: Secondary | ICD-10-CM | POA: Diagnosis not present

## 2019-10-12 LAB — CBC WITH DIFFERENTIAL (CANCER CENTER ONLY)
Abs Immature Granulocytes: 0.06 10*3/uL (ref 0.00–0.07)
Basophils Absolute: 0 10*3/uL (ref 0.0–0.1)
Basophils Relative: 0 %
Eosinophils Absolute: 0 10*3/uL (ref 0.0–0.5)
Eosinophils Relative: 0 %
HCT: 34.2 % — ABNORMAL LOW (ref 36.0–46.0)
Hemoglobin: 11 g/dL — ABNORMAL LOW (ref 12.0–15.0)
Immature Granulocytes: 1 %
Lymphocytes Relative: 48 %
Lymphs Abs: 2.8 10*3/uL (ref 0.7–4.0)
MCH: 31.3 pg (ref 26.0–34.0)
MCHC: 32.2 g/dL (ref 30.0–36.0)
MCV: 97.2 fL (ref 80.0–100.0)
Monocytes Absolute: 0.6 10*3/uL (ref 0.1–1.0)
Monocytes Relative: 9 %
Neutro Abs: 2.5 10*3/uL (ref 1.7–7.7)
Neutrophils Relative %: 42 %
Platelet Count: 222 10*3/uL (ref 150–400)
RBC: 3.52 MIL/uL — ABNORMAL LOW (ref 3.87–5.11)
RDW: 15.9 % — ABNORMAL HIGH (ref 11.5–15.5)
WBC Count: 6 10*3/uL (ref 4.0–10.5)
nRBC: 0 % (ref 0.0–0.2)

## 2019-10-12 LAB — CMP (CANCER CENTER ONLY)
ALT: 18 U/L (ref 0–44)
AST: 17 U/L (ref 15–41)
Albumin: 3.6 g/dL (ref 3.5–5.0)
Alkaline Phosphatase: 113 U/L (ref 38–126)
Anion gap: 10 (ref 5–15)
BUN: 16 mg/dL (ref 8–23)
CO2: 22 mmol/L (ref 22–32)
Calcium: 8.8 mg/dL — ABNORMAL LOW (ref 8.9–10.3)
Chloride: 104 mmol/L (ref 98–111)
Creatinine: 0.85 mg/dL (ref 0.44–1.00)
GFR, Est AFR Am: >60 mL/min (ref >60)
GFR, Estimated: >60 mL/min (ref >60)
Glucose, Bld: 94 mg/dL (ref 70–99)
Potassium: 4.4 mmol/L (ref 3.5–5.1)
Sodium: 136 mmol/L (ref 135–145)
Total Bilirubin: 0.3 mg/dL (ref 0.3–1.2)
Total Protein: 7.5 g/dL (ref 6.5–8.1)

## 2019-10-12 MED ORDER — SODIUM CHLORIDE 0.9 % IV SOLN
140.0000 mg/m2 | Freq: Once | INTRAVENOUS | Status: AC
  Administered 2019-10-12: 270 mg via INTRAVENOUS
  Filled 2019-10-12: qty 45

## 2019-10-12 MED ORDER — DIPHENHYDRAMINE HCL 50 MG/ML IJ SOLN
25.0000 mg | Freq: Once | INTRAMUSCULAR | Status: AC
  Administered 2019-10-12: 25 mg via INTRAVENOUS

## 2019-10-12 MED ORDER — HEPARIN SOD (PORK) LOCK FLUSH 100 UNIT/ML IV SOLN
500.0000 [IU] | Freq: Once | INTRAVENOUS | Status: AC | PRN
  Administered 2019-10-12: 500 [IU]
  Filled 2019-10-12: qty 5

## 2019-10-12 MED ORDER — DIPHENHYDRAMINE HCL 50 MG/ML IJ SOLN
INTRAMUSCULAR | Status: AC
  Filled 2019-10-12: qty 1

## 2019-10-12 MED ORDER — SODIUM CHLORIDE 0.9% FLUSH
10.0000 mL | INTRAVENOUS | Status: DC | PRN
  Administered 2019-10-12: 10 mL
  Filled 2019-10-12: qty 10

## 2019-10-12 MED ORDER — PALONOSETRON HCL INJECTION 0.25 MG/5ML
INTRAVENOUS | Status: AC
  Filled 2019-10-12: qty 5

## 2019-10-12 MED ORDER — SODIUM CHLORIDE 0.9 % IV SOLN
Freq: Once | INTRAVENOUS | Status: AC
  Administered 2019-10-12: 13:00:00 via INTRAVENOUS
  Filled 2019-10-12: qty 250

## 2019-10-12 MED ORDER — FAMOTIDINE IN NACL 20-0.9 MG/50ML-% IV SOLN
INTRAVENOUS | Status: AC
  Filled 2019-10-12: qty 50

## 2019-10-12 MED ORDER — ALTEPLASE 2 MG IJ SOLR
INTRAMUSCULAR | Status: AC
  Filled 2019-10-12: qty 2

## 2019-10-12 MED ORDER — SODIUM CHLORIDE 0.9 % IV SOLN
20.0000 mg | Freq: Once | INTRAVENOUS | Status: AC
  Administered 2019-10-12: 20 mg via INTRAVENOUS
  Filled 2019-10-12: qty 2

## 2019-10-12 MED ORDER — FAMOTIDINE IN NACL 20-0.9 MG/50ML-% IV SOLN
20.0000 mg | Freq: Once | INTRAVENOUS | Status: AC
  Administered 2019-10-12: 20 mg via INTRAVENOUS

## 2019-10-12 MED ORDER — PALONOSETRON HCL INJECTION 0.25 MG/5ML
0.2500 mg | Freq: Once | INTRAVENOUS | Status: AC
  Administered 2019-10-12: 0.25 mg via INTRAVENOUS

## 2019-10-12 MED ORDER — ALTEPLASE 2 MG IJ SOLR
2.0000 mg | Freq: Once | INTRAMUSCULAR | Status: AC | PRN
  Administered 2019-10-12: 2 mg
  Filled 2019-10-12: qty 2

## 2019-10-12 MED ORDER — SODIUM CHLORIDE 0.9 % IV SOLN
505.8000 mg | Freq: Once | INTRAVENOUS | Status: AC
  Administered 2019-10-12: 510 mg via INTRAVENOUS
  Filled 2019-10-12: qty 51

## 2019-10-12 NOTE — Patient Instructions (Signed)
[facility] Discharge Instructions for Patients Receiving Chemotherapy  Today you received the following chemotherapy agents taxol/carboplatin  To help prevent nausea and vomiting after your treatment, we encourage you to take your nausea medication as directed   If you develop nausea and vomiting that is not controlled by your nausea medication, call the clinic.   BELOW ARE SYMPTOMS THAT SHOULD BE REPORTED IMMEDIATELY:  *FEVER GREATER THAN 100.5 F  *CHILLS WITH OR WITHOUT FEVER  NAUSEA AND VOMITING THAT IS NOT CONTROLLED WITH YOUR NAUSEA MEDICATION  *UNUSUAL SHORTNESS OF BREATH  *UNUSUAL BRUISING OR BLEEDING  TENDERNESS IN MOUTH AND THROAT WITH OR WITHOUT PRESENCE OF ULCERS  *URINARY PROBLEMS  *BOWEL PROBLEMS  UNUSUAL RASH Items with * indicate a potential emergency and should be followed up as soon as possible.  Feel free to call the clinic you have any questions or concerns. The clinic phone number is [phone].  

## 2019-10-12 NOTE — Progress Notes (Addendum)
[place] OFFICE PROGRESS NOTE   Diagnosis: Fallopian tube carcinoma  INTERVAL HISTORY:   Ms. Chancellor returns as scheduled.  She completed a cycle of Taxol/carboplatin/Avastin 09/21/2019.  She was seen in the office 10/04/2019 with acute onset of pleuritic left-sided chest pain and shortness of breath.  CT showed left lower lobe pulmonary emboli, pneumonia.  She was started on Xarelto and Levaquin.  Covid test negative on 10/04/2019.  She feels better.  No shortness of breath.  She has mild pain at the left mid to lower back with deep inspiration.  No fever or cough.  No bleeding.  She denies nausea/vomiting.  No mouth sores.  No diarrhea or constipation.  Objective:  Vital signs in last 24 hours:  Blood pressure 123/72, pulse 88, temperature 98 F (36.7 C), temperature source Temporal, resp. rate 18, weight 175 lb 1.6 oz (79.4 kg), SpO2 100 %.    HEENT: No thrush or ulcers. Resp: Very faint rales at the left lower lung field.  No respiratory distress. Cardio: Regular rate and rhythm. GI: Abdomen soft and nontender.  No hepatomegaly. Vascular: No leg edema. Neuro: Alert and oriented. Skin: No rash. Port-A-Cath without erythema.   Lab Results:  Lab Results  Component Value Date   WBC 6.0 10/12/2019   HGB 11.0 (L) 10/12/2019   HCT 34.2 (L) 10/12/2019   MCV 97.2 10/12/2019   PLT 222 10/12/2019   NEUTROABS 2.5 10/12/2019    Imaging:  No results found.  Medications: I have reviewed the patient's current medications.  Assessment/Plan: 1. Left abdomen/pelvic pain ? CT abdomen/pelvis 06/26/2017-soft tissue implants in the lower anterior peritoneum with implants at the paracolic gutters and left pelvic sidewall ? NegativeMYRIADhereditary cancer panel ? Elevated CA 125 ? CT biopsy of anterior omental mass 07/04/2017-Metastatic carcinoma consistent with a gynecologic primary ? 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 Cycle 1 adjuvant Taxol/carboplatin 08/05/2017 ? Cycle 2 adjuvant Taxol/carboplatin 08/26/2017 ? Cycle 3 adjuvant Taxol/carboplatin 09/17/2017-Taxol dose reduced secondary to neuropathy and bone pain ? Cycle 4 adjuvant Taxol/carboplatin 10/10/2017 ? Cycle 5 adjuvant Taxol/carboplatin 10/31/2017 ? Cycle 6 adjuvant Taxol/carboplatin 11/21/2017 ? CT abdomen/pelvis 08/08/2019- narrowing of rectosigmoid colon with eccentric serosal implant, 10 mm lymph node at the superior rectal vein, left upper quadrant soft tissue implant, 9 mm precaval node, 15 mm left internal iliac node, no ascites ? Cycle 1 Taxol/carboplatin 08/31/2019 ? Cycle 2 Taxol/carboplatin/Avastin 09/21/2019 ? Cycle 3 Taxol/Carboplatin 10/12/2019 (Avastin discontinued)  2. Depression  3. Hypothyroid  4. Family history of uterine cancer-paternal grandmother  55.   Acute onset dyspnea/pleuritic left-sided chest pain 10/02/2019  CT chest 10/04/2019-left lower lobe pulmonary emboli, multifocal pneumonia lower lobes  On Xarelto, status post course of Levaquin.   Disposition: Ms. Schnieders appears stable.  She completed cycle 2 Taxol/carboplatin and cycle 1 Avastin 09/21/2019.  She was subsequently diagnosed with pulmonary emboli and is now on Xarelto.  Dr. Benay Spice recommends discontinuation of Avastin.  Plan to proceed with cycle 3 Taxol/carboplatin today as scheduled.  We reviewed the CBC from today.  Counts are adequate to proceed with treatment.  She will return for lab, follow-up, cycle 4 Taxol/carboplatin in 3 weeks.  She will contact the office in the  interim with any problems.  Patient  seen with Dr. Benay Spice.  25 minutes were spent face-to-face at today's visit with the majority of that time involved in counseling/coordination of care.  Ned Card ANP/GNP-BC   10/12/2019  10:36 AM  This was a shared visit with Ned Card.  Ms. Tyrell has not improved clinical status since she started anticoagulation therapy.  We decided to hold Avastin from the systemic therapy regimen.  She will complete cycle 3 Taxol/carboplatin day.  She will be referred for lower extremity Dopplers.  Julieanne Manson the

## 2019-10-12 NOTE — Telephone Encounter (Addendum)
Notified pt of [place] appt 11/25 @9am  at [facility] radiology. Advised pt to arrive 15 min early. Pt verbalized understanding.

## 2019-10-12 NOTE — Patient Instructions (Signed)
Tunneled Central Venous Catheter Flushing Guide  It is important to flush your tunneled central venous catheter each time you use it, both before and after you use it. Flushing your catheter will help prevent it from clogging. What are the risks? Risks may include:  Infection.  Air getting into the catheter and bloodstream. Supplies needed:  A clean pair of gloves.  A disinfecting wipe. Use an alcohol wipe, chlorhexidine wipe, or iodine wipe as told by your health care provider.  A 10 mL syringe that has been prefilled with saline solution.  An empty 10 mL syringe, if a substance called heparin was injected into your catheter. How to flush your catheter When you flush your catheter, make sure you follow any specific instructions from your health care provider or the manufacturer. These are general guidelines. Flushing your catheter before use If there is heparin in your catheter: 1. Wash your hands with soap and water. 2. Put on gloves. 3. Scrub the injection cap for a minimum of 15 seconds with a disinfecting wipe. 4. Unclamp the catheter. 5. Attach the empty syringe to the injection cap. 6. Pull the syringe plunger back and withdraw 10 mL of blood. 7. Place the syringe into an appropriate waste container. 8. Scrub the injection cap for 15 seconds with a disinfecting wipe. 9. Attach the prefilled syringe to the injection cap. 10. Flush the catheter by pushing the plunger forward until all the liquid from the syringe is in the catheter. 11. Remove the syringe from the injection cap. 12. Clamp the catheter. If there is no heparin in your catheter: 1. Wash your hands with soap and water. 2. Put on gloves. 3. Scrub the injection cap for 15 seconds with a disinfecting wipe. 4. Unclamp the catheter. 5. Attach the prefilled syringe to the injection cap. 6. Flush the catheter by pushing the plunger forward until 5 mL of the liquid from the syringe is in the catheter. 7. Pull back on  the syringe until you see blood in the catheter. 8. If you have been asked to collect any blood, follow your health care provider's instructions. Otherwise, flush the catheter with the rest of the solution from the syringe. 9. Remove the syringe from the injection cap. 10. Clamp the catheter.  Flushing your catheter after use 1. Wash your hands with soap and water. 2. Put on gloves. 3. Scrub the injection cap for 15 seconds with a disinfecting wipe. 4. Unclamp the catheter. 5. Attach the prefilled syringe to the injection cap. 6. Flush the catheter by pushing the plunger forward until all of the liquid from the syringe is in the catheter. 7. Remove the syringe from the injection cap. 8. Clamp the catheter. Problems and solutions  If blood cannot be completely cleared from the injection cap, you may need to have the injection cap replaced.  If the catheter is difficult to flush, use the pulsing method. The pulsing method involves pushing only a few milliliters of solution into the catheter at a time and pausing between pushes.  If you do not see blood in the catheter when you pull back on the syringe, change your body position, such as by raising your arms above your head. Take a deep breath and cough. Then, pull back on the syringe. If you still do not see blood, flush the catheter with a small amount of solution. Then, change positions again and take a breath or cough. Pull back on the syringe again. If you still do not see   blood, finish flushing the catheter and contact your health care provider. Do not use your catheter until your health care provider says it is okay. General tips  Have someone help you flush your catheter, if possible.  Do not force fluid through your catheter.  Do not use a syringe that is larger or smaller than 10 mL. Using a smaller syringe can make the catheter burst.  Do not use your catheter without flushing it first if it has heparin in it. Contact a health  care provider if:  You cannot see any blood in the catheter when you flush it before using it.  Your catheter is difficult to flush. Get help right away if:  You cannot flush the catheter.  The catheter leaks when you flush it or when there is fluid in it.  There are cracks or breaks in the catheter. Summary  It is important to flush your tunneled central venous catheter each time you use it, both before and after you use it.  Scrub the injection cap for 15 seconds with a disinfecting wipe before and after you flush it.  When you flush your catheter, make sure you follow any specific instructions from your health care provider or the manufacturer.  Get help right away if you cannot flush the catheter. This information is not intended to replace advice given to you by your health care provider. Make sure you discuss any questions you have with your health care provider. Document Released: 10/24/2011 Document Revised: 01/20/2019 Document Reviewed: 01/20/2019 Elsevier Patient Education  2020 [employer]vier Inc.  

## 2019-10-13 ENCOUNTER — Ambulatory Visit (HOSPITAL_COMMUNITY)
Admission: RE | Admit: 2019-10-13 | Discharge: 2019-10-13 | Disposition: A | Discharge status: Home / Self Care | Payer: BC Managed Care – PPO | Source: Ambulatory Visit | Attending: Nurse Practitioner | Admitting: Nurse Practitioner

## 2019-10-13 DIAGNOSIS — C5700 Malignant neoplasm of unspecified fallopian tube: Secondary | ICD-10-CM

## 2019-10-13 NOTE — Progress Notes (Signed)
Lower extremity venous has been completed.   Preliminary results in CV Proc.   Abram Sander 10/13/2019 9:05 AM

## 2019-10-14 LAB — CA 125: Cancer Antigen (CA) 125: 18 U/mL (ref 0.0–38.1)

## 2019-10-27 ENCOUNTER — Encounter: Payer: Self-pay | Admitting: Internal Medicine

## 2019-10-27 ENCOUNTER — Telehealth: Payer: Self-pay | Admitting: *Deleted

## 2019-10-27 MED ORDER — RIVAROXABAN 20 MG PO TABS: 20.0000 mg | ORAL_TABLET | Freq: Every day | ORAL | 5 refills | Status: DC

## 2019-10-27 MED ORDER — RIVAROXABAN 20 MG PO TABS: 20.0000 mg | ORAL_TABLET | Freq: Every day | ORAL | 1 refills | Status: DC

## 2019-10-27 NOTE — Telephone Encounter (Addendum)
Called to report she only has #7 day supply on her starter pack of Xarelto. Refill sent to [facility] for 20mg  daily. Sent to [facility] and patient notified. Provided her phone # to check on price. She also wants to see if [facility] is less expensive. Informed her to call back if it needs to go elsewhere. Called back to confirm to keep script at [facility] and is asking for 90 day supply. Re-sent script for #90 as requested.

## 2019-10-27 NOTE — Addendum Note (Signed)
Addended by: Tania Ade on: 10/27/2019 04:33 PM   Modules accepted: Orders

## 2019-10-28 ENCOUNTER — Other Ambulatory Visit: Payer: Self-pay

## 2019-10-28 MED ORDER — OMEPRAZOLE 20 MG PO CPDR: DELAYED_RELEASE_CAPSULE | ORAL | 1 refills | Status: DC

## 2019-10-28 MED ORDER — LEVOTHYROXINE SODIUM 88 MCG PO TABS: ORAL_TABLET | ORAL | 1 refills | Status: DC

## 2019-10-28 MED ORDER — ACYCLOVIR 400 MG PO TABS: 400.0000 mg | ORAL_TABLET | ORAL | 0 refills | Status: DC | PRN

## 2019-10-28 MED ORDER — BUPROPION HCL ER (XL) 300 MG PO TB24: 300.0000 mg | ORAL_TABLET | Freq: Every day | ORAL | 1 refills | Status: DC

## 2019-10-28 MED FILL — XARELTO 20 MG TABLET: 20 | 90 days supply | Qty: 90 | Fill #0

## 2019-10-31 ENCOUNTER — Other Ambulatory Visit: Payer: Self-pay | Admitting: Oncology

## 2019-11-01 ENCOUNTER — Other Ambulatory Visit: Payer: Self-pay

## 2019-11-01 MED ORDER — EPINEPHRINE 0.3 MG/0.3ML IJ SOAJ: 0.3000 mg | INTRAMUSCULAR | 0 refills | Status: DC | PRN

## 2019-11-02 ENCOUNTER — Inpatient Hospital Stay: Payer: BC Managed Care – PPO

## 2019-11-02 ENCOUNTER — Inpatient Hospital Stay: Payer: BC Managed Care – PPO | Attending: Oncology | Admitting: Oncology

## 2019-11-02 ENCOUNTER — Ambulatory Visit: Payer: BC Managed Care – PPO | Admitting: Oncology

## 2019-11-02 ENCOUNTER — Other Ambulatory Visit: Payer: BC Managed Care – PPO

## 2019-11-02 ENCOUNTER — Other Ambulatory Visit: Payer: Self-pay

## 2019-11-02 VITALS — BP 120/83 | Temp 98.5°F | Resp 17 | Ht 64.0 in | Wt 177.1 lb

## 2019-11-02 DIAGNOSIS — C5700 Malignant neoplasm of unspecified fallopian tube: Secondary | ICD-10-CM

## 2019-11-02 DIAGNOSIS — Z8041 Family history of malignant neoplasm of ovary: Secondary | ICD-10-CM | POA: Insufficient documentation

## 2019-11-02 DIAGNOSIS — E039 Hypothyroidism, unspecified: Secondary | ICD-10-CM | POA: Diagnosis not present

## 2019-11-02 DIAGNOSIS — C5702 Malignant neoplasm of left fallopian tube: Secondary | ICD-10-CM

## 2019-11-02 DIAGNOSIS — Z79899 Other long term (current) drug therapy: Secondary | ICD-10-CM | POA: Insufficient documentation

## 2019-11-02 DIAGNOSIS — Z86711 Personal history of pulmonary embolism: Secondary | ICD-10-CM | POA: Diagnosis not present

## 2019-11-02 DIAGNOSIS — Z5111 Encounter for antineoplastic chemotherapy: Secondary | ICD-10-CM | POA: Diagnosis not present

## 2019-11-02 DIAGNOSIS — F329 Major depressive disorder, single episode, unspecified: Secondary | ICD-10-CM | POA: Diagnosis not present

## 2019-11-02 DIAGNOSIS — Z7901 Long term (current) use of anticoagulants: Secondary | ICD-10-CM | POA: Diagnosis not present

## 2019-11-02 DIAGNOSIS — Z95828 Presence of other vascular implants and grafts: Secondary | ICD-10-CM

## 2019-11-02 LAB — CBC WITH DIFFERENTIAL (CANCER CENTER ONLY)
Abs Immature Granulocytes: 0.03 10*3/uL (ref 0.00–0.07)
Basophils Absolute: 0 10*3/uL (ref 0.0–0.1)
Basophils Relative: 0 %
Eosinophils Absolute: 0 10*3/uL (ref 0.0–0.5)
Eosinophils Relative: 0 %
HCT: 33.1 % — ABNORMAL LOW (ref 36.0–46.0)
Hemoglobin: 10.8 g/dL — ABNORMAL LOW (ref 12.0–15.0)
Immature Granulocytes: 1 %
Lymphocytes Relative: 29 %
Lymphs Abs: 1.8 10*3/uL (ref 0.7–4.0)
MCH: 32.3 pg (ref 26.0–34.0)
MCHC: 32.6 g/dL (ref 30.0–36.0)
MCV: 99.1 fL (ref 80.0–100.0)
Monocytes Absolute: 0.6 10*3/uL (ref 0.1–1.0)
Monocytes Relative: 9 %
Neutro Abs: 3.9 10*3/uL (ref 1.7–7.7)
Neutrophils Relative %: 61 %
Platelet Count: 186 10*3/uL (ref 150–400)
RBC: 3.34 MIL/uL — ABNORMAL LOW (ref 3.87–5.11)
RDW: 18.3 % — ABNORMAL HIGH (ref 11.5–15.5)
WBC Count: 6.4 10*3/uL (ref 4.0–10.5)
nRBC: 0 % (ref 0.0–0.2)

## 2019-11-02 LAB — CMP (CANCER CENTER ONLY)
ALT: 20 U/L (ref 0–44)
AST: 16 U/L (ref 15–41)
Albumin: 3.7 g/dL (ref 3.5–5.0)
Alkaline Phosphatase: 101 U/L (ref 38–126)
Anion gap: 11 (ref 5–15)
BUN: 14 mg/dL (ref 8–23)
CO2: 24 mmol/L (ref 22–32)
Calcium: 9.2 mg/dL (ref 8.9–10.3)
Chloride: 104 mmol/L (ref 98–111)
Creatinine: 0.82 mg/dL (ref 0.44–1.00)
GFR, Est AFR Am: >60 mL/min (ref >60)
GFR, Estimated: >60 mL/min (ref >60)
Glucose, Bld: 130 mg/dL — ABNORMAL HIGH (ref 70–99)
Potassium: 4.1 mmol/L (ref 3.5–5.1)
Sodium: 139 mmol/L (ref 135–145)
Total Bilirubin: 0.4 mg/dL (ref 0.3–1.2)
Total Protein: 7.6 g/dL (ref 6.5–8.1)

## 2019-11-02 MED ORDER — SODIUM CHLORIDE 0.9% FLUSH
10.0000 mL | INTRAVENOUS | Status: DC | PRN
  Administered 2019-11-02: 10 mL via INTRAVENOUS
  Filled 2019-11-02: qty 10

## 2019-11-02 MED ORDER — DIPHENHYDRAMINE HCL 50 MG/ML IJ SOLN
25.0000 mg | Freq: Once | INTRAMUSCULAR | Status: AC
  Administered 2019-11-02: 25 mg via INTRAVENOUS

## 2019-11-02 MED ORDER — SODIUM CHLORIDE 0.9 % IV SOLN
Freq: Once | INTRAVENOUS | Status: AC
  Filled 2019-11-02: qty 250

## 2019-11-02 MED ORDER — PALONOSETRON HCL INJECTION 0.25 MG/5ML
0.2500 mg | Freq: Once | INTRAVENOUS | Status: AC
  Administered 2019-11-02: 0.25 mg via INTRAVENOUS

## 2019-11-02 MED ORDER — SODIUM CHLORIDE 0.9% FLUSH
10.0000 mL | INTRAVENOUS | Status: DC | PRN
  Administered 2019-11-02: 10 mL
  Filled 2019-11-02: qty 10

## 2019-11-02 MED ORDER — FAMOTIDINE IN NACL 20-0.9 MG/50ML-% IV SOLN
INTRAVENOUS | Status: AC
  Filled 2019-11-02: qty 50

## 2019-11-02 MED ORDER — SODIUM CHLORIDE 0.9 % IV SOLN
520.2000 mg | Freq: Once | INTRAVENOUS | Status: AC
  Administered 2019-11-02: 520.2 mg via INTRAVENOUS
  Filled 2019-11-02: qty 52.02

## 2019-11-02 MED ORDER — SODIUM CHLORIDE 0.9 % IV SOLN
140.0000 mg/m2 | Freq: Once | INTRAVENOUS | Status: AC
  Administered 2019-11-02: 270 mg via INTRAVENOUS
  Filled 2019-11-02: qty 45

## 2019-11-02 MED ORDER — SODIUM CHLORIDE 0.9 % IV SOLN
20.0000 mg | Freq: Once | INTRAVENOUS | Status: DC
  Filled 2019-11-02: qty 2

## 2019-11-02 MED ORDER — PALONOSETRON HCL INJECTION 0.25 MG/5ML
INTRAVENOUS | Status: AC
  Filled 2019-11-02: qty 5

## 2019-11-02 MED ORDER — HEPARIN SOD (PORK) LOCK FLUSH 100 UNIT/ML IV SOLN
500.0000 [IU] | Freq: Once | INTRAVENOUS | Status: AC | PRN
  Administered 2019-11-02: 500 [IU]
  Filled 2019-11-02: qty 5

## 2019-11-02 MED ORDER — SODIUM CHLORIDE 0.9 % IV SOLN
20.0000 mg | Freq: Once | INTRAVENOUS | Status: AC
  Administered 2019-11-02: 20 mg via INTRAVENOUS
  Filled 2019-11-02: qty 20

## 2019-11-02 MED ORDER — DEXAMETHASONE SODIUM PHOSPHATE 10 MG/ML IJ SOLN: 20.0000 mg | Freq: Once | INTRAMUSCULAR | Status: DC

## 2019-11-02 MED ORDER — FAMOTIDINE IN NACL 20-0.9 MG/50ML-% IV SOLN
20.0000 mg | Freq: Once | INTRAVENOUS | Status: AC
  Administered 2019-11-02: 20 mg via INTRAVENOUS

## 2019-11-02 MED ORDER — DIPHENHYDRAMINE HCL 50 MG/ML IJ SOLN
INTRAMUSCULAR | Status: AC
  Filled 2019-11-02: qty 1

## 2019-11-02 NOTE — Patient Instructions (Signed)

## 2019-11-02 NOTE — Patient Instructions (Signed)
[facility] Discharge Instructions for Patients Receiving Chemotherapy  Today you received the following chemotherapy agents Taxol, Carboplatin  To help prevent nausea and vomiting after your treatment, we encourage you to take your nausea medication as directed  If you develop nausea and vomiting that is not controlled by your nausea medication, call the clinic.   BELOW ARE SYMPTOMS THAT SHOULD BE REPORTED IMMEDIATELY:  *FEVER GREATER THAN 100.5 F  *CHILLS WITH OR WITHOUT FEVER  NAUSEA AND VOMITING THAT IS NOT CONTROLLED WITH YOUR NAUSEA MEDICATION  *UNUSUAL SHORTNESS OF BREATH  *UNUSUAL BRUISING OR BLEEDING  TENDERNESS IN MOUTH AND THROAT WITH OR WITHOUT PRESENCE OF ULCERS  *URINARY PROBLEMS  *BOWEL PROBLEMS  UNUSUAL RASH Items with * indicate a potential emergency and should be followed up as soon as possible.  Feel free to call the clinic should you have any questions or concerns. The clinic phone number is [phone].  Please show the CHEMO ALERT CARD at check-in to the Emergency Department and triage nurse.   

## 2019-11-02 NOTE — Progress Notes (Signed)
[place] OFFICE PROGRESS NOTE   Diagnosis: Fallopian tube carcinoma  INTERVAL HISTORY:   Anna [name] completed another cycle of Taxol/carboplatin on October 12, 2019.  No nausea, symptom of allergic reaction, or neuropathy symptoms.  Abdominal and chest pain have resolved.  She is having bowel movements.  No complaint. She continues Xarelto anticoagulation. Objective:  Vital signs in last 24 hours:  Blood pressure 120/83, temperature 98.5 F (36.9 C), temperature source Temporal, resp. rate 17, height 5' 4"  (1.626 m), weight 177 lb 1.6 oz (80.3 kg), SpO2 99 %.     GI: Nontender, no mass, no hepatosplenomegaly, no apparent ascites Vascular: No leg edema   Portacath/PICC-without erythema  Lab Results:  Lab Results  Component Value Date   WBC 6.4 11/02/2019   HGB 10.8 (L) 11/02/2019   HCT 33.1 (L) 11/02/2019   MCV 99.1 11/02/2019   PLT 186 11/02/2019   NEUTROABS 3.9 11/02/2019    CMP  Lab Results  Component Value Date   NA 136 10/12/2019   K 4.4 10/12/2019   CL 104 10/12/2019   CO2 22 10/12/2019   GLUCOSE 94 10/12/2019   BUN 16 10/12/2019   CREATININE 0.85 10/12/2019   CALCIUM 8.8 (L) 10/12/2019   PROT 7.5 10/12/2019   ALBUMIN 3.6 10/12/2019   AST 17 10/12/2019   ALT 18 10/12/2019   ALKPHOS 113 10/12/2019   BILITOT 0.3 10/12/2019   GFRNONAA >60 10/12/2019   GFRAA >60 10/12/2019    CA-125 on October 12, 2019: 18  Medications: I have reviewed the patient's current medications.   Assessment/Plan: 1. Left abdomen/pelvic pain ? CT abdomen/pelvis 06/26/2017-soft tissue implants in the lower anterior peritoneum with implants at the paracolic gutters and left pelvic sidewall ? NegativeMYRIADhereditary cancer panel ? Elevated CA 125 ? CT biopsy of anterior omental mass 07/04/2017-Metastatic carcinoma consistent with a gynecologic primary ? 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 Cycle 1 adjuvant Taxol/carboplatin 08/05/2017 ? Cycle 2 adjuvant Taxol/carboplatin 08/26/2017 ? Cycle 3 adjuvant Taxol/carboplatin 09/17/2017-Taxol dose reduced secondary to neuropathy and bone pain ? Cycle 4 adjuvant Taxol/carboplatin 10/10/2017 ? Cycle 5 adjuvant Taxol/carboplatin 10/31/2017 ? Cycle 6 adjuvant Taxol/carboplatin 11/21/2017 ? CT abdomen/pelvis 08/08/2019- narrowing of rectosigmoid colon with eccentric serosal implant, 10 mm lymph node at the superior rectal vein, left upper quadrant soft tissue implant, 9 mm precaval node, 15 mm left internal iliac node, no ascites ? Cycle 1 Taxol/carboplatin 08/31/2019 ? Cycle 2 Taxol/carboplatin/Avastin 09/21/2019 ? Cycle 3 Taxol/Carboplatin 10/12/2019 (Avastin discontinued) ? Cycle 4 Taxol/carboplatin November 02, 2019  2. Depression  3. Hypothyroid  4. Family history of uterine cancer-paternal grandmother  73.   Acute onset dyspnea/pleuritic left-sided chest pain 10/02/2019  CT chest 10/04/2019-left lower lobe pulmonary emboli, multifocal pneumonia lower lobes  On Xarelto, status post course of Levaquin.    Disposition: Anna [name] appears stable.  She will complete cycle 4 Taxol/carboplatin today.  Abdominal pain and constipation have resolved since beginning salvage therapy.  She continues Xarelto anticoagulation for the pulmonary embolism.  Avastin has been discontinued from the systemic therapy regimen.  Anna [name] will undergo a restaging CT evaluation after this cycle.  She will return for an office visit in 3 weeks.  Dominica Severin  Benay Spice, MD  11/02/2019  10:27 AM

## 2019-11-03 ENCOUNTER — Telehealth: Payer: Self-pay | Admitting: Oncology

## 2019-11-03 LAB — CA 125: Cancer Antigen (CA) 125: 14.3 U/mL (ref 0.0–38.1)

## 2019-11-03 NOTE — Telephone Encounter (Signed)
Scheduled per los. Called and spoke with patient. Confirmed appt 

## 2019-11-05 ENCOUNTER — Telehealth: Payer: Self-pay | Admitting: Oncology

## 2019-11-05 ENCOUNTER — Other Ambulatory Visit: Payer: BC Managed Care – PPO

## 2019-11-05 NOTE — Telephone Encounter (Signed)
Returned patient's phone call regarding rescheduling 01/05 appointments, per patient's request appointment has moved to 01/04.

## 2019-11-09 ENCOUNTER — Encounter: Payer: Self-pay | Admitting: Internal Medicine

## 2019-11-09 ENCOUNTER — Other Ambulatory Visit: Payer: Self-pay

## 2019-11-09 MED ORDER — ACYCLOVIR 400 MG PO TABS: ORAL_TABLET | ORAL | 0 refills | Status: DC

## 2019-11-09 NOTE — Telephone Encounter (Signed)
Called and spoke with patient to let her know there was an error and I have corrected it in her chart and sent in new rx to [facility]. Advised that I was not sure exactly how or why the directions got changed but has been corrected- rx was sent in the day our system had new update. Apologized for the confusion. Patient stated that was fine but she would like to speak with Dr Nicki Reaper directly about this matter or have a direct response from her doctor. Nothing further needs to be done.

## 2019-11-09 NOTE — Telephone Encounter (Signed)
Called pt.  Unable to reach.  Left pt a message.  Left message to call back and leave number and times could talk if needed to talk.

## 2019-11-17 ENCOUNTER — Other Ambulatory Visit: Payer: Self-pay

## 2019-11-17 ENCOUNTER — Ambulatory Visit (HOSPITAL_COMMUNITY)
Admission: RE | Admit: 2019-11-17 | Discharge: 2019-11-17 | Disposition: A | Discharge status: Home / Self Care | Payer: BC Managed Care – PPO | Source: Ambulatory Visit | Attending: Oncology | Admitting: Oncology

## 2019-11-17 DIAGNOSIS — C5700 Malignant neoplasm of unspecified fallopian tube: Secondary | ICD-10-CM | POA: Diagnosis not present

## 2019-11-17 DIAGNOSIS — K439 Ventral hernia without obstruction or gangrene: Secondary | ICD-10-CM | POA: Diagnosis not present

## 2019-11-17 MED ORDER — HEPARIN SOD (PORK) LOCK FLUSH 100 UNIT/ML IV SOLN
500.0000 [IU] | Freq: Once | INTRAVENOUS | Status: AC
  Administered 2019-11-17: 500 [IU] via INTRAVENOUS

## 2019-11-17 MED ORDER — IOHEXOL 300 MG/ML  SOLN
100.0000 mL | Freq: Once | INTRAMUSCULAR | Status: AC | PRN
  Administered 2019-11-17: 100 mL via INTRAVENOUS

## 2019-11-17 MED ORDER — HEPARIN SOD (PORK) LOCK FLUSH 100 UNIT/ML IV SOLN
INTRAVENOUS | Status: AC
  Filled 2019-11-17: qty 5

## 2019-11-17 MED ORDER — SODIUM CHLORIDE (PF) 0.9 % IJ SOLN
INTRAMUSCULAR | Status: AC
  Filled 2019-11-17: qty 50

## 2019-11-20 ENCOUNTER — Other Ambulatory Visit: Payer: Self-pay | Admitting: Oncology

## 2019-11-22 ENCOUNTER — Inpatient Hospital Stay (HOSPITAL_BASED_OUTPATIENT_CLINIC_OR_DEPARTMENT_OTHER): Payer: BC Managed Care – PPO | Admitting: Oncology

## 2019-11-22 ENCOUNTER — Inpatient Hospital Stay: Payer: BC Managed Care – PPO

## 2019-11-22 ENCOUNTER — Other Ambulatory Visit: Payer: Self-pay

## 2019-11-22 ENCOUNTER — Inpatient Hospital Stay: Payer: BC Managed Care – PPO | Attending: Oncology

## 2019-11-22 VITALS — BP 113/82 | HR 97 | Temp 98.2°F | Resp 17 | Ht 64.0 in | Wt 180.0 lb

## 2019-11-22 DIAGNOSIS — C5702 Malignant neoplasm of left fallopian tube: Secondary | ICD-10-CM | POA: Diagnosis not present

## 2019-11-22 DIAGNOSIS — Z5111 Encounter for antineoplastic chemotherapy: Secondary | ICD-10-CM | POA: Diagnosis not present

## 2019-11-22 DIAGNOSIS — Z923 Personal history of irradiation: Secondary | ICD-10-CM | POA: Insufficient documentation

## 2019-11-22 DIAGNOSIS — R200 Anesthesia of skin: Secondary | ICD-10-CM | POA: Diagnosis not present

## 2019-11-22 DIAGNOSIS — E039 Hypothyroidism, unspecified: Secondary | ICD-10-CM | POA: Diagnosis not present

## 2019-11-22 DIAGNOSIS — C5700 Malignant neoplasm of unspecified fallopian tube: Secondary | ICD-10-CM

## 2019-11-22 DIAGNOSIS — F329 Major depressive disorder, single episode, unspecified: Secondary | ICD-10-CM | POA: Insufficient documentation

## 2019-11-22 DIAGNOSIS — Z7901 Long term (current) use of anticoagulants: Secondary | ICD-10-CM | POA: Diagnosis not present

## 2019-11-22 LAB — CBC WITH DIFFERENTIAL (CANCER CENTER ONLY)
Abs Immature Granulocytes: 0.03 10*3/uL (ref 0.00–0.07)
Basophils Absolute: 0 10*3/uL (ref 0.0–0.1)
Basophils Relative: 1 %
Eosinophils Absolute: 0 10*3/uL (ref 0.0–0.5)
Eosinophils Relative: 0 %
HCT: 29.8 % — ABNORMAL LOW (ref 36.0–46.0)
Hemoglobin: 9.8 g/dL — ABNORMAL LOW (ref 12.0–15.0)
Immature Granulocytes: 1 %
Lymphocytes Relative: 47 %
Lymphs Abs: 2.7 10*3/uL (ref 0.7–4.0)
MCH: 33.9 pg (ref 26.0–34.0)
MCHC: 32.9 g/dL (ref 30.0–36.0)
MCV: 103.1 fL — ABNORMAL HIGH (ref 80.0–100.0)
Monocytes Absolute: 0.8 10*3/uL (ref 0.1–1.0)
Monocytes Relative: 13 %
Neutro Abs: 2.1 10*3/uL (ref 1.7–7.7)
Neutrophils Relative %: 38 %
Platelet Count: 160 10*3/uL (ref 150–400)
RBC: 2.89 MIL/uL — ABNORMAL LOW (ref 3.87–5.11)
RDW: 19.8 % — ABNORMAL HIGH (ref 11.5–15.5)
WBC Count: 5.6 10*3/uL (ref 4.0–10.5)
nRBC: 0 % (ref 0.0–0.2)

## 2019-11-22 LAB — CMP (CANCER CENTER ONLY)
ALT: 21 U/L (ref 0–44)
AST: 17 U/L (ref 15–41)
Albumin: 3.7 g/dL (ref 3.5–5.0)
Alkaline Phosphatase: 86 U/L (ref 38–126)
Anion gap: 13 (ref 5–15)
BUN: 20 mg/dL (ref 8–23)
CO2: 21 mmol/L — ABNORMAL LOW (ref 22–32)
Calcium: 8.8 mg/dL — ABNORMAL LOW (ref 8.9–10.3)
Chloride: 106 mmol/L (ref 98–111)
Creatinine: 0.95 mg/dL (ref 0.44–1.00)
GFR, Est AFR Am: >60 mL/min (ref >60)
GFR, Estimated: >60 mL/min (ref >60)
Glucose, Bld: 98 mg/dL (ref 70–99)
Potassium: 4.1 mmol/L (ref 3.5–5.1)
Sodium: 140 mmol/L (ref 135–145)
Total Bilirubin: 0.3 mg/dL (ref 0.3–1.2)
Total Protein: 7.3 g/dL (ref 6.5–8.1)

## 2019-11-22 MED ORDER — SODIUM CHLORIDE 0.9% FLUSH
10.0000 mL | INTRAVENOUS | Status: DC | PRN
  Administered 2019-11-22: 10 mL
  Filled 2019-11-22: qty 10

## 2019-11-22 MED ORDER — FAMOTIDINE IN NACL 20-0.9 MG/50ML-% IV SOLN
INTRAVENOUS | Status: AC
  Filled 2019-11-22: qty 50

## 2019-11-22 MED ORDER — HEPARIN SOD (PORK) LOCK FLUSH 100 UNIT/ML IV SOLN
500.0000 [IU] | Freq: Once | INTRAVENOUS | Status: AC | PRN
  Administered 2019-11-22: 500 [IU]
  Filled 2019-11-22: qty 5

## 2019-11-22 MED ORDER — PALONOSETRON HCL INJECTION 0.25 MG/5ML
INTRAVENOUS | Status: AC
  Filled 2019-11-22: qty 5

## 2019-11-22 MED ORDER — SODIUM CHLORIDE 0.9 % IV SOLN
464.4000 mg | Freq: Once | INTRAVENOUS | Status: AC
  Administered 2019-11-22: 460 mg via INTRAVENOUS
  Filled 2019-11-22: qty 46

## 2019-11-22 MED ORDER — DIPHENHYDRAMINE HCL 50 MG/ML IJ SOLN
INTRAMUSCULAR | Status: AC
  Filled 2019-11-22: qty 1

## 2019-11-22 MED ORDER — SODIUM CHLORIDE 0.9 % IV SOLN
Freq: Once | INTRAVENOUS | Status: AC
  Filled 2019-11-22: qty 250

## 2019-11-22 MED ORDER — PALONOSETRON HCL INJECTION 0.25 MG/5ML
0.2500 mg | Freq: Once | INTRAVENOUS | Status: AC
  Administered 2019-11-22: 0.25 mg via INTRAVENOUS

## 2019-11-22 MED ORDER — FAMOTIDINE IN NACL 20-0.9 MG/50ML-% IV SOLN
20.0000 mg | Freq: Once | INTRAVENOUS | Status: AC
  Administered 2019-11-22: 20 mg via INTRAVENOUS

## 2019-11-22 MED ORDER — SODIUM CHLORIDE 0.9 % IV SOLN
20.0000 mg | Freq: Once | INTRAVENOUS | Status: AC
  Administered 2019-11-22: 20 mg via INTRAVENOUS
  Filled 2019-11-22: qty 20

## 2019-11-22 MED ORDER — DIPHENHYDRAMINE HCL 50 MG/ML IJ SOLN
25.0000 mg | Freq: Once | INTRAMUSCULAR | Status: AC
  Administered 2019-11-22: 25 mg via INTRAVENOUS

## 2019-11-22 MED ORDER — SODIUM CHLORIDE 0.9 % IV SOLN
140.0000 mg/m2 | Freq: Once | INTRAVENOUS | Status: AC
  Administered 2019-11-22: 270 mg via INTRAVENOUS
  Filled 2019-11-22: qty 45

## 2019-11-22 NOTE — Progress Notes (Signed)
[facility] OFFICE PROGRESS NOTE   Diagnosis: Fallopian tube carcinoma  INTERVAL HISTORY:   Anna [name] returns as scheduled.  She completed another cycle of chemotherapy on 11/02/2019.  No nausea.  No difficulty with bowel function.  No chest pain.  She has mild numbness at the first and second fingertips of each hand.  This does not interfere with activity.  No foot numbness.  Objective:  Vital signs in last 24 hours:  Blood pressure 113/82, pulse 97, temperature 98.2 F (36.8 C), temperature source Temporal, resp. rate 17, height 5' 4"  (1.626 m), weight 180 lb (81.6 kg), SpO2 100 %.    Limited physical examination secondary to distancing with the Covid pandemic GI: No mass, no apparent ascites, nontender, no hepatomegaly Vascular: No leg edema Skin: Palms without erythema  Portacath/PICC-without erythema  Lab Results:  Lab Results  Component Value Date   WBC 5.6 11/22/2019   HGB 9.8 (L) 11/22/2019   HCT 29.8 (L) 11/22/2019   MCV 103.1 (H) 11/22/2019   PLT 160 11/22/2019   NEUTROABS 2.1 11/22/2019    CMP  Lab Results  Component Value Date   NA 140 11/22/2019   K 4.1 11/22/2019   CL 106 11/22/2019   CO2 21 (L) 11/22/2019   GLUCOSE 98 11/22/2019   BUN 20 11/22/2019   CREATININE 0.95 11/22/2019   CALCIUM 8.8 (L) 11/22/2019   PROT 7.3 11/22/2019   ALBUMIN 3.7 11/22/2019   AST 17 11/22/2019   ALT 21 11/22/2019   ALKPHOS 86 11/22/2019   BILITOT 0.3 11/22/2019   GFRNONAA >60 11/22/2019   GFRAA >60 11/22/2019    Lab Results  Component Value Date   CEA1 <1.00 06/27/2017   Medications: I have reviewed the patient's current medications.   Assessment/Plan: 1. Left abdomen/pelvic pain ? CT abdomen/pelvis 06/26/2017-soft tissue implants in the lower anterior peritoneum with implants at the paracolic gutters and left pelvic sidewall ? NegativeMYRIADhereditary cancer panel ? Elevated CA 125 ? CT biopsy of anterior omental mass  07/04/2017-Metastatic carcinoma consistent with a gynecologic primary ? 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 Cycle 1 adjuvant Taxol/carboplatin 08/05/2017 ? Cycle 2 adjuvant Taxol/carboplatin 08/26/2017 ? Cycle 3 adjuvant Taxol/carboplatin 09/17/2017-Taxol dose reduced secondary to neuropathy and bone pain ? Cycle 4 adjuvant Taxol/carboplatin 10/10/2017 ? Cycle 5 adjuvant Taxol/carboplatin 10/31/2017 ? Cycle 6 adjuvant Taxol/carboplatin 11/21/2017 ? CT abdomen/pelvis 08/08/2019- narrowing of rectosigmoid colon with eccentric serosal implant, 10 mm lymph node at the superior rectal vein, left upper quadrant soft tissue implant, 9 mm precaval node, 15 mm left internal iliac node, no ascites ? Cycle 1 Taxol/carboplatin 08/31/2019 ? Cycle 2 Taxol/carboplatin/Avastin 09/21/2019 ? Cycle 3 Taxol/Carboplatin 10/12/2019 (Avastin discontinued) ? Cycle 4 Taxol/carboplatin November 02, 2019 ? CT abdomen/pelvis 11/17/2019-decrease in size of previously prominent pericaval, left internal iliac, and superior rectal vein nodes, resolved rectosigmoid wall thickening ? Cycle 5 Taxol/carboplatin 11/22/2019  2. Depression  3. Hypothyroid  4. Family history of uterine cancer-paternal grandmother  7.   Acute onset dyspnea/pleuritic left-sided chest pain 10/02/2019  CT chest 10/04/2019-left lower lobe pulmonary emboli, multifocal pneumonia lower lobes  On Xarelto, status post course of Levaquin.   Disposition: Anna [name] appears well.  She has completed 4  cycles of salvage therapy with Taxol/carboplatin.  Her symptoms have  improved and there is evidence of a radiologic response to therapy.  She will complete cycle 5 Taxol/carboplatin today.  Anna [name] will return for an office visit and chemotherapy in 3 weeks.  The plan is to complete 6 cycles of Taxol/carboplatin.  We will then consider switching to maintenance therapy or a treatment break.  I reviewed the CT images.  [name], MD  11/22/2019  9:28 AM

## 2019-11-22 NOTE — Patient Instructions (Signed)
[facility] Discharge Instructions for Patients Receiving Chemotherapy  Today you received the following chemotherapy agents Taxol, Carboplatin  To help prevent nausea and vomiting after your treatment, we encourage you to take your nausea medication as directed  If you develop nausea and vomiting that is not controlled by your nausea medication, call the clinic.   BELOW ARE SYMPTOMS THAT SHOULD BE REPORTED IMMEDIATELY:  *FEVER GREATER THAN 100.5 F  *CHILLS WITH OR WITHOUT FEVER  NAUSEA AND VOMITING THAT IS NOT CONTROLLED WITH YOUR NAUSEA MEDICATION  *UNUSUAL SHORTNESS OF BREATH  *UNUSUAL BRUISING OR BLEEDING  TENDERNESS IN MOUTH AND THROAT WITH OR WITHOUT PRESENCE OF ULCERS  *URINARY PROBLEMS  *BOWEL PROBLEMS  UNUSUAL RASH Items with * indicate a potential emergency and should be followed up as soon as possible.  Feel free to call the clinic should you have any questions or concerns. The clinic phone number is [phone].  Please show the CHEMO ALERT CARD at check-in to the Emergency Department and triage nurse.   

## 2019-11-23 ENCOUNTER — Other Ambulatory Visit: Payer: BC Managed Care – PPO

## 2019-11-23 ENCOUNTER — Ambulatory Visit: Payer: BC Managed Care – PPO | Admitting: Oncology

## 2019-11-23 ENCOUNTER — Ambulatory Visit: Payer: BC Managed Care – PPO

## 2019-11-23 ENCOUNTER — Ambulatory Visit: Payer: BC Managed Care – PPO | Admitting: Nurse Practitioner

## 2019-11-23 LAB — CA 125: Cancer Antigen (CA) 125: 14 U/mL (ref 0.0–38.1)

## 2019-11-24 ENCOUNTER — Telehealth: Payer: Self-pay | Admitting: Oncology

## 2019-11-24 NOTE — Telephone Encounter (Signed)
Patient returned phone call regarding voicemail that was left, per patient's request appointment has been rescheduled.

## 2019-11-24 NOTE — Telephone Encounter (Signed)
Returned patient's phone call regarding upcoming appointments, left a voicemail. 

## 2019-11-24 NOTE — Telephone Encounter (Signed)
Scheduled per los. Called and left msg. Mailed printout  °

## 2019-12-12 ENCOUNTER — Other Ambulatory Visit: Payer: Self-pay | Admitting: Oncology

## 2019-12-13 ENCOUNTER — Inpatient Hospital Stay: Payer: BC Managed Care – PPO

## 2019-12-13 ENCOUNTER — Ambulatory Visit: Payer: BC Managed Care – PPO

## 2019-12-13 ENCOUNTER — Other Ambulatory Visit: Payer: BC Managed Care – PPO

## 2019-12-13 ENCOUNTER — Ambulatory Visit: Payer: BC Managed Care – PPO | Admitting: Nurse Practitioner

## 2019-12-13 ENCOUNTER — Inpatient Hospital Stay (HOSPITAL_BASED_OUTPATIENT_CLINIC_OR_DEPARTMENT_OTHER): Payer: BC Managed Care – PPO | Admitting: Oncology

## 2019-12-13 ENCOUNTER — Other Ambulatory Visit: Payer: Self-pay

## 2019-12-13 VITALS — BP 103/52 | HR 96 | Temp 98.2°F | Resp 20 | Wt 181.1 lb

## 2019-12-13 VITALS — BP 100/78 | HR 93

## 2019-12-13 DIAGNOSIS — C5702 Malignant neoplasm of left fallopian tube: Secondary | ICD-10-CM

## 2019-12-13 DIAGNOSIS — F329 Major depressive disorder, single episode, unspecified: Secondary | ICD-10-CM | POA: Diagnosis not present

## 2019-12-13 DIAGNOSIS — E039 Hypothyroidism, unspecified: Secondary | ICD-10-CM | POA: Diagnosis not present

## 2019-12-13 DIAGNOSIS — Z95828 Presence of other vascular implants and grafts: Secondary | ICD-10-CM | POA: Insufficient documentation

## 2019-12-13 DIAGNOSIS — R200 Anesthesia of skin: Secondary | ICD-10-CM | POA: Diagnosis not present

## 2019-12-13 DIAGNOSIS — Z7901 Long term (current) use of anticoagulants: Secondary | ICD-10-CM | POA: Diagnosis not present

## 2019-12-13 DIAGNOSIS — Z923 Personal history of irradiation: Secondary | ICD-10-CM | POA: Diagnosis not present

## 2019-12-13 DIAGNOSIS — Z5111 Encounter for antineoplastic chemotherapy: Secondary | ICD-10-CM | POA: Diagnosis not present

## 2019-12-13 LAB — CMP (CANCER CENTER ONLY)
ALT: 21 U/L (ref 0–44)
AST: 18 U/L (ref 15–41)
Albumin: 3.7 g/dL (ref 3.5–5.0)
Alkaline Phosphatase: 77 U/L (ref 38–126)
Anion gap: 10 (ref 5–15)
BUN: 15 mg/dL (ref 8–23)
CO2: 22 mmol/L (ref 22–32)
Calcium: 8.7 mg/dL — ABNORMAL LOW (ref 8.9–10.3)
Chloride: 106 mmol/L (ref 98–111)
Creatinine: 0.83 mg/dL (ref 0.44–1.00)
GFR, Est AFR Am: >60 mL/min (ref >60)
GFR, Estimated: >60 mL/min (ref >60)
Glucose, Bld: 107 mg/dL — ABNORMAL HIGH (ref 70–99)
Potassium: 4.3 mmol/L (ref 3.5–5.1)
Sodium: 138 mmol/L (ref 135–145)
Total Bilirubin: <0.2 mg/dL — ABNORMAL LOW (ref 0.3–1.2)
Total Protein: 7 g/dL (ref 6.5–8.1)

## 2019-12-13 LAB — CBC WITH DIFFERENTIAL (CANCER CENTER ONLY)
Abs Immature Granulocytes: 0.03 10*3/uL (ref 0.00–0.07)
Basophils Absolute: 0 10*3/uL (ref 0.0–0.1)
Basophils Relative: 0 %
Eosinophils Absolute: 0 10*3/uL (ref 0.0–0.5)
Eosinophils Relative: 0 %
HCT: 28 % — ABNORMAL LOW (ref 36.0–46.0)
Hemoglobin: 9.1 g/dL — ABNORMAL LOW (ref 12.0–15.0)
Immature Granulocytes: 1 %
Lymphocytes Relative: 47 %
Lymphs Abs: 2.1 10*3/uL (ref 0.7–4.0)
MCH: 34.7 pg — ABNORMAL HIGH (ref 26.0–34.0)
MCHC: 32.5 g/dL (ref 30.0–36.0)
MCV: 106.9 fL — ABNORMAL HIGH (ref 80.0–100.0)
Monocytes Absolute: 0.6 10*3/uL (ref 0.1–1.0)
Monocytes Relative: 14 %
Neutro Abs: 1.7 10*3/uL (ref 1.7–7.7)
Neutrophils Relative %: 38 %
Platelet Count: 143 10*3/uL — ABNORMAL LOW (ref 150–400)
RBC: 2.62 MIL/uL — ABNORMAL LOW (ref 3.87–5.11)
RDW: 19.7 % — ABNORMAL HIGH (ref 11.5–15.5)
WBC Count: 4.4 10*3/uL (ref 4.0–10.5)
nRBC: 0.5 % — ABNORMAL HIGH (ref 0.0–0.2)

## 2019-12-13 MED ORDER — SODIUM CHLORIDE 0.9 % IV SOLN
515.2500 mg | Freq: Once | INTRAVENOUS | Status: DC
  Filled 2019-12-13: qty 52

## 2019-12-13 MED ORDER — FAMOTIDINE IN NACL 20-0.9 MG/50ML-% IV SOLN
20.0000 mg | Freq: Once | INTRAVENOUS | Status: AC
  Administered 2019-12-13: 20 mg via INTRAVENOUS

## 2019-12-13 MED ORDER — PALONOSETRON HCL INJECTION 0.25 MG/5ML
INTRAVENOUS | Status: AC
  Filled 2019-12-13: qty 5

## 2019-12-13 MED ORDER — SODIUM CHLORIDE 0.9 % IV SOLN
20.0000 mg | Freq: Once | INTRAVENOUS | Status: AC
  Administered 2019-12-13: 20 mg via INTRAVENOUS
  Filled 2019-12-13: qty 20

## 2019-12-13 MED ORDER — SODIUM CHLORIDE 0.9 % IV SOLN
140.0000 mg/m2 | Freq: Once | INTRAVENOUS | Status: DC
  Filled 2019-12-13: qty 45

## 2019-12-13 MED ORDER — SODIUM CHLORIDE 0.9 % IV SOLN
Freq: Once | INTRAVENOUS | Status: AC
  Filled 2019-12-13: qty 250

## 2019-12-13 MED ORDER — SODIUM CHLORIDE 0.9% FLUSH
10.0000 mL | INTRAVENOUS | Status: DC | PRN
  Administered 2019-12-13: 10 mL
  Filled 2019-12-13: qty 10

## 2019-12-13 MED ORDER — SODIUM CHLORIDE 0.9 % IV SOLN
423.6500 mg | Freq: Once | INTRAVENOUS | Status: AC
  Administered 2019-12-13: 420 mg via INTRAVENOUS
  Filled 2019-12-13: qty 42

## 2019-12-13 MED ORDER — SODIUM CHLORIDE 0.9 % IV SOLN
110.0000 mg/m2 | Freq: Once | INTRAVENOUS | Status: AC
  Administered 2019-12-13: 210 mg via INTRAVENOUS
  Filled 2019-12-13: qty 35

## 2019-12-13 MED ORDER — DIPHENHYDRAMINE HCL 50 MG/ML IJ SOLN
25.0000 mg | Freq: Once | INTRAMUSCULAR | Status: AC
  Administered 2019-12-13: 25 mg via INTRAVENOUS

## 2019-12-13 MED ORDER — DIPHENHYDRAMINE HCL 50 MG/ML IJ SOLN
INTRAMUSCULAR | Status: AC
  Filled 2019-12-13: qty 1

## 2019-12-13 MED ORDER — FAMOTIDINE IN NACL 20-0.9 MG/50ML-% IV SOLN
INTRAVENOUS | Status: AC
  Filled 2019-12-13: qty 50

## 2019-12-13 MED ORDER — HEPARIN SOD (PORK) LOCK FLUSH 100 UNIT/ML IV SOLN
500.0000 [IU] | Freq: Once | INTRAVENOUS | Status: AC | PRN
  Administered 2019-12-13: 500 [IU]
  Filled 2019-12-13: qty 5

## 2019-12-13 MED ORDER — PALONOSETRON HCL INJECTION 0.25 MG/5ML
0.2500 mg | Freq: Once | INTRAVENOUS | Status: AC
  Administered 2019-12-13: 0.25 mg via INTRAVENOUS

## 2019-12-13 NOTE — Patient Instructions (Signed)
[facility] Discharge Instructions for Patients Receiving Chemotherapy  Today you received the following chemotherapy agents Paclitaxel (TAXOL) & Carboplatin (PARAPLATIN).  To help prevent nausea and vomiting after your treatment, we encourage you to take your nausea medication as prescribed.   If you develop nausea and vomiting that is not controlled by your nausea medication, call the clinic.   BELOW ARE SYMPTOMS THAT SHOULD BE REPORTED IMMEDIATELY:  *FEVER GREATER THAN 100.5 F  *CHILLS WITH OR WITHOUT FEVER  NAUSEA AND VOMITING THAT IS NOT CONTROLLED WITH YOUR NAUSEA MEDICATION  *UNUSUAL SHORTNESS OF BREATH  *UNUSUAL BRUISING OR BLEEDING  TENDERNESS IN MOUTH AND THROAT WITH OR WITHOUT PRESENCE OF ULCERS  *URINARY PROBLEMS  *BOWEL PROBLEMS  UNUSUAL RASH Items with * indicate a potential emergency and should be followed up as soon as possible.  Feel free to call the clinic should you have any questions or concerns. The clinic phone number is [phone].  Please show the CHEMO ALERT CARD at check-in to the Emergency Department and triage nurse.  Coronavirus (COVID-19) Are you at risk?  Are you at risk for the Coronavirus (COVID-19)?  To be considered HIGH RISK for Coronavirus (COVID-19), you have to meet the following criteria:  . Traveled to [place], [place], [place], [place] or [place]; or in the [place] to [place], [place], [place], or [place]; and have fever, cough, and shortness of breath within the last 2 weeks of travel OR . Been in close contact with a person diagnosed with COVID-19 within the last 2 weeks and have fever, cough, and shortness of breath . IF YOU DO NOT MEET THESE CRITERIA, YOU ARE CONSIDERED LOW RISK FOR COVID-19.  What to do if you are HIGH RISK for COVID-19?  . If you are having a medical emergency, call 911. . Seek medical care right away. Before you go to a doctor's office, urgent care or emergency department,  call ahead and tell them about your recent travel, contact with someone diagnosed with COVID-19, and your symptoms. You should receive instructions from your physician's office regarding next steps of care.  . When you arrive at healthcare provider, tell the healthcare staff immediately you have returned from visiting [place], [place], [place], [place] or [place]; or traveled in the [place] to [place], [place], [place], or [place]; in the last two weeks or you have been in close contact with a person diagnosed with COVID-19 in the last 2 weeks.   . Tell the health care staff about your symptoms: fever, cough and shortness of breath. . After you have been seen by a medical provider, you will be either: o Tested for (COVID-19) and discharged home on quarantine except to seek medical care if symptoms worsen, and asked to  - Stay home and avoid contact with others until you get your results (4-5 days)  - Avoid travel on public transportation if possible (such as bus, train, or airplane) or o Sent to the Emergency Department by EMS for evaluation, COVID-19 testing, and possible admission depending on your condition and test results.  What to do if you are LOW RISK for COVID-19?  Reduce your risk of any infection by using the same precautions used for avoiding the common cold or flu:  . Wash your hands often with soap and warm water for at least 20 seconds.  If soap and water are not readily available, use an alcohol-based hand sanitizer with at least 60% alcohol.  .   If coughing or sneezing, cover your mouth and nose by coughing or sneezing into the elbow areas of your shirt or coat, into a tissue or into your sleeve (not your hands). . Avoid shaking hands with others and consider head nods or verbal greetings only. . Avoid touching your eyes, nose, or mouth with unwashed hands.  . Avoid close contact with people who are sick. . Avoid places or events with large numbers of people in one  location, like concerts or sporting events. . Carefully consider travel plans you have or are making. . If you are planning any travel outside or inside the [place], visit the CDC's Travelers' Health webpage for the latest health notices. . If you have some symptoms but not all symptoms, continue to monitor at home and seek medical attention if your symptoms worsen. . If you are having a medical emergency, call 911.   ADDITIONAL HEALTHCARE OPTIONS FOR PATIENTS  [facility] / e-Visit: [email or web address]         [facility]: [phone]  [facility]: [phone]                   [facility]: [phone]   

## 2019-12-13 NOTE — Progress Notes (Signed)
[facility] OFFICE VISIT PROGRESS NOTE  I connected with Anna [name] on 12/13/19 at  8:30 AM EST by video and verified that I am speaking with the correct person using two identifiers.   I discussed the limitations, risks, security and privacy concerns of performing an evaluation and management service by telemedicine and the availability of in-person appointments. I also discussed with the patient that there may be a patient responsible charge related to this service. The patient expressed understanding and agreed to proceed.    Patient's location: Office Provider's location: Home    Diagnosis: Fallopian tube carcinoma  INTERVAL HISTORY:   Anna [name] completed another cycle of Taxol/carboplatin on 11/22/2019.  She had an acute episode of nausea/vomiting and diarrhea on 11/26/2019.  She took Compazine and this did not recur.  She has mild numbness in the fingertips.  No bleeding or symptom of thrombosis.  She feels well.  Objective:  Vital signs in last 24 hours:  Blood pressure (!) 103/52, pulse 96, temperature 98.2 F (36.8 C), temperature source Temporal, resp. rate 20, weight 181 lb 1.6 oz (82.1 kg), SpO2 100 %.      Lab Results:  Lab Results  Component Value Date   WBC 4.4 12/13/2019   HGB 9.1 (L) 12/13/2019   HCT 28.0 (L) 12/13/2019   MCV 106.9 (H) 12/13/2019   PLT 143 (L) 12/13/2019   NEUTROABS 1.7 12/13/2019   Medications: I have reviewed the patient's current medications.  Assessment/Plan: 1. Left abdomen/pelvic pain ? CT abdomen/pelvis 06/26/2017-soft tissue implants in the lower anterior peritoneum with implants at the paracolic gutters and left pelvic sidewall ? NegativeMYRIADhereditary cancer panel ? Elevated CA 125 ? CT biopsy of anterior omental mass 07/04/2017-Metastatic carcinoma consistent with a gynecologic primary ? 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 Cycle 1 adjuvant Taxol/carboplatin 08/05/2017 ? Cycle 2 adjuvant Taxol/carboplatin 08/26/2017 ? Cycle 3 adjuvant Taxol/carboplatin 09/17/2017-Taxol dose reduced secondary to neuropathy and bone pain ? Cycle 4 adjuvant Taxol/carboplatin 10/10/2017 ? Cycle 5 adjuvant Taxol/carboplatin 10/31/2017 ? Cycle 6 adjuvant Taxol/carboplatin 11/21/2017 ? CT abdomen/pelvis 08/08/2019-narrowing of rectosigmoid colon with eccentric serosal implant, 10 mm lymph node at the superior rectal vein, left upper quadrant soft tissue implant, 9 mm precaval node, 15 mm left internal iliac node, no ascites ? Cycle 1 Taxol/carboplatin 08/31/2019 ? Cycle 2 Taxol/carboplatin/Avastin 09/21/2019 ? Cycle 3 Taxol/Carboplatin 10/12/2019 (Avastin discontinued) ? Cycle 4 Taxol/carboplatin November 02, 2019 ? CT abdomen/pelvis 11/17/2019-decrease in size of previously prominent pericaval, left internal iliac, and superior rectal vein nodes, resolved rectosigmoid wall thickening ? Cycle 5 Taxol/carboplatin 11/22/2019 ? Cycle 6 Taxol/carboplatin 12/13/2019  2. Depression  3. Hypothyroid  4. Family history of uterine cancer-paternal grandmother  46.Acute onset dyspnea/pleuritic left-sided chest pain 10/02/2019  CT chest 10/04/2019-left lower lobe pulmonary emboli, multifocal pneumonia lower lobes  On Xarelto, status post course of Levaquin.    Disposition: Anna [name] appears stable.  She will complete cycle 6 of salvage treatment with Taxol/carboplatin today.  She will return for an office visit and chemotherapy in 3 weeks.  We will consider switching to a maintenance regimen when  she returns in 3 weeks.   I discussed the assessment and  treatment plan with the patient. The patient was provided an opportunity to ask questions and all were answered. The patient agreed with the plan and demonstrated an understanding of the instructions.   The patient was advised to call back or seek an in-person evaluation if the symptoms worsen or if the condition fails to improve as anticipated.  I provided 20 minutes of video, chart review, and documentation time during this encounter, and > 50% was spent counseling as documented under my assessment & plan.  [name] ANP/GNP-BC   12/13/2019 8:52 AM

## 2019-12-14 ENCOUNTER — Other Ambulatory Visit: Payer: Self-pay | Admitting: Internal Medicine

## 2019-12-14 LAB — CA 125: Cancer Antigen (CA) 125: 12.3 U/mL (ref 0.0–38.1)

## 2019-12-16 ENCOUNTER — Telehealth: Payer: Self-pay | Admitting: Oncology

## 2019-12-16 NOTE — Telephone Encounter (Signed)
Returned patient's phone call regarding rescheduling an appointment, per patient's request 02/16 appointment times have been rescheduled.

## 2019-12-28 ENCOUNTER — Other Ambulatory Visit: Payer: Self-pay | Admitting: Oncology

## 2019-12-28 MED ORDER — OLAPARIB 150 MG PO TABS: 300.0000 mg | ORAL_TABLET | Freq: Two times a day (BID) | ORAL | 0 refills | Status: DC

## 2019-12-29 ENCOUNTER — Telehealth: Payer: Self-pay | Admitting: Pharmacist

## 2019-12-29 ENCOUNTER — Encounter: Payer: Self-pay | Admitting: *Deleted

## 2019-12-29 ENCOUNTER — Telehealth: Payer: Self-pay

## 2019-12-29 NOTE — Telephone Encounter (Signed)
Obtained Lynparza copay card for patient up to $2600/year.  Bin: [id number] ID: [id number] PCN: [id number] Group: [id number] Exp: 11/17/20  Billing info has been shared with [place] Patient [place] Phone [phone] Fax [phone] 12/29/2019 2:54 PM

## 2019-12-29 NOTE — Telephone Encounter (Signed)
Oral Chemotherapy Pharmacist Encounter  [name] plans on picking up her medication from [place] tomorrow 12/30/19. She know to bring the [place]  Patient Education I spoke with patient for overview of new oral chemotherapy medication: Lynparza (olaparib) for the maintenance treatment of fallopian tube carcinoma following treatment with carboplatin and paclitaxol. Planned start 01/04/20.   Counseled patient on administration, dosing, side effects, monitoring, drug-food interactions, safe handling, storage, and disposal. Patient will take 2 tablets (300 mg total) by mouth 2 (two) times daily.  Side effects include but not limited to: N/V, diarrhea, decreased wbc/plt, fatigue.    Reviewed with patient importance of keeping a medication schedule and plan for any missed doses.  Anna [name] voiced understanding and appreciation. All questions answered. Medication handout placed in the mail.  Provided patient with Oral [facility] phone number. Patient knows to call the office with questions or concerns. Oral Chemotherapy Navigation Clinic will continue to follow.  Darl Pikes, PharmD, BCPS, [facility] Hematology/Oncology Clinical Pharmacist [facility]/AP Oral [facility] [phone]  12/29/2019 3:33 PM

## 2019-12-29 NOTE — Progress Notes (Signed)
Therapy change to olaparib 300 mg bid. MD could not get script to e-scribe. Printed, signed and faxed to [facility]. Notified Nuala Alpha, PharmD of new treatment plan.

## 2019-12-29 NOTE — Telephone Encounter (Signed)
Oral Oncology Patient Advocate Encounter  Prior Authorization for [place] [place] has been approved.    PA# [id number] Effective dates: 12/29/19 through 12/27/20  Patients co-pay is $6611.60  Oral Oncology Clinic will continue to follow.   [place] Patient [place] Phone [phone] Fax [phone] 12/29/2019 2:13 PM

## 2019-12-29 NOTE — Telephone Encounter (Signed)
Oral Oncology Pharmacist Encounter  Received new prescription for Lynparza (olaparib) for the maintenance treatment of fallopian tube carcinoma following treatment with carboplatin and paclitaxol. Planned start 01/04/20.  CMP from 12/13/19 assessed, no relevant lab abnormalities. Prescription dose and frequency assessed.   Current medication list in Epic reviewed, no DDIs with olaparib identified.  Prescription has been e-scribed to the [facility] for benefits analysis and approval.  Oral Oncology Clinic will continue to follow for insurance authorization, copayment issues, initial counseling and start date.  Darl Pikes, PharmD, BCPS, [facility] Hematology/Oncology Clinical Pharmacist [facility]/AP Oral [facility] [phone]  12/29/2019 9:46 AM

## 2019-12-29 NOTE — Telephone Encounter (Signed)
Oral Oncology Patient Advocate Encounter  Received notification from [facility] that prior authorization for Anna [name] is required.  PA submitted on CoverMyMeds Key [id number] Status is pending  Oral Oncology Clinic will continue to follow.  [place] Patient [place] Phone [phone] Fax [phone] 12/29/2019 2:12 PM

## 2019-12-30 MED FILL — LYNPARZA 150 MG TABLET: 150 | 15 days supply | Qty: 60 | Fill #0

## 2020-01-04 ENCOUNTER — Inpatient Hospital Stay (HOSPITAL_BASED_OUTPATIENT_CLINIC_OR_DEPARTMENT_OTHER): Payer: BC Managed Care – PPO | Admitting: Oncology

## 2020-01-04 ENCOUNTER — Inpatient Hospital Stay: Payer: BC Managed Care – PPO

## 2020-01-04 ENCOUNTER — Other Ambulatory Visit: Payer: BC Managed Care – PPO

## 2020-01-04 ENCOUNTER — Ambulatory Visit: Payer: BC Managed Care – PPO | Admitting: Nurse Practitioner

## 2020-01-04 ENCOUNTER — Inpatient Hospital Stay: Payer: BC Managed Care – PPO | Attending: Oncology

## 2020-01-04 ENCOUNTER — Ambulatory Visit: Payer: BC Managed Care – PPO

## 2020-01-04 ENCOUNTER — Other Ambulatory Visit: Payer: Self-pay

## 2020-01-04 VITALS — BP 128/86 | HR 110 | Temp 97.8°F | Resp 18 | Ht 64.0 in | Wt 182.1 lb

## 2020-01-04 DIAGNOSIS — Z95828 Presence of other vascular implants and grafts: Secondary | ICD-10-CM

## 2020-01-04 DIAGNOSIS — Z9221 Personal history of antineoplastic chemotherapy: Secondary | ICD-10-CM | POA: Diagnosis not present

## 2020-01-04 DIAGNOSIS — Z86711 Personal history of pulmonary embolism: Secondary | ICD-10-CM | POA: Insufficient documentation

## 2020-01-04 DIAGNOSIS — Z8589 Personal history of malignant neoplasm of other organs and systems: Secondary | ICD-10-CM | POA: Diagnosis not present

## 2020-01-04 DIAGNOSIS — R110 Nausea: Secondary | ICD-10-CM | POA: Insufficient documentation

## 2020-01-04 DIAGNOSIS — R5383 Other fatigue: Secondary | ICD-10-CM | POA: Insufficient documentation

## 2020-01-04 DIAGNOSIS — C5702 Malignant neoplasm of left fallopian tube: Secondary | ICD-10-CM

## 2020-01-04 DIAGNOSIS — E039 Hypothyroidism, unspecified: Secondary | ICD-10-CM | POA: Insufficient documentation

## 2020-01-04 DIAGNOSIS — F329 Major depressive disorder, single episode, unspecified: Secondary | ICD-10-CM | POA: Insufficient documentation

## 2020-01-04 LAB — CBC WITH DIFFERENTIAL (CANCER CENTER ONLY)
Abs Immature Granulocytes: 0.02 10*3/uL (ref 0.00–0.07)
Basophils Absolute: 0 10*3/uL (ref 0.0–0.1)
Basophils Relative: 0 %
Eosinophils Absolute: 0 10*3/uL (ref 0.0–0.5)
Eosinophils Relative: 0 %
HCT: 29.3 % — ABNORMAL LOW (ref 36.0–46.0)
Hemoglobin: 9.4 g/dL — ABNORMAL LOW (ref 12.0–15.0)
Immature Granulocytes: 0 %
Lymphocytes Relative: 38 %
Lymphs Abs: 1.8 10*3/uL (ref 0.7–4.0)
MCH: 36.4 pg — ABNORMAL HIGH (ref 26.0–34.0)
MCHC: 32.1 g/dL (ref 30.0–36.0)
MCV: 113.6 fL — ABNORMAL HIGH (ref 80.0–100.0)
Monocytes Absolute: 0.6 10*3/uL (ref 0.1–1.0)
Monocytes Relative: 13 %
Neutro Abs: 2.2 10*3/uL (ref 1.7–7.7)
Neutrophils Relative %: 49 %
Platelet Count: 135 10*3/uL — ABNORMAL LOW (ref 150–400)
RBC: 2.58 MIL/uL — ABNORMAL LOW (ref 3.87–5.11)
RDW: 17.6 % — ABNORMAL HIGH (ref 11.5–15.5)
WBC Count: 4.7 10*3/uL (ref 4.0–10.5)
nRBC: 0 % (ref 0.0–0.2)

## 2020-01-04 LAB — CMP (CANCER CENTER ONLY)
ALT: 25 U/L (ref 0–44)
AST: 19 U/L (ref 15–41)
Albumin: 3.8 g/dL (ref 3.5–5.0)
Alkaline Phosphatase: 75 U/L (ref 38–126)
Anion gap: 12 (ref 5–15)
BUN: 16 mg/dL (ref 8–23)
CO2: 23 mmol/L (ref 22–32)
Calcium: 9.1 mg/dL (ref 8.9–10.3)
Chloride: 105 mmol/L (ref 98–111)
Creatinine: 0.97 mg/dL (ref 0.44–1.00)
GFR, Est AFR Am: >60 mL/min (ref >60)
GFR, Estimated: >60 mL/min (ref >60)
Glucose, Bld: 104 mg/dL — ABNORMAL HIGH (ref 70–99)
Potassium: 4.2 mmol/L (ref 3.5–5.1)
Sodium: 140 mmol/L (ref 135–145)
Total Bilirubin: 0.3 mg/dL (ref 0.3–1.2)
Total Protein: 7.3 g/dL (ref 6.5–8.1)

## 2020-01-04 MED ORDER — SODIUM CHLORIDE 0.9% FLUSH
10.0000 mL | INTRAVENOUS | Status: DC | PRN
  Administered 2020-01-04: 10 mL
  Filled 2020-01-04: qty 10

## 2020-01-04 NOTE — Patient Instructions (Signed)

## 2020-01-04 NOTE — Progress Notes (Signed)
[facility] OFFICE PROGRESS NOTE   Diagnosis: Fallopian tube carcinoma  INTERVAL HISTORY:   Ms. Haltiwanger completed another cycle of Taxol/carboplatin on 12/13/2019.  She had mild nausea a few days following chemotherapy.  No emesis.  She has mild numbness in the extremities.  She has malaise.  No other complaint.  Objective:  Vital signs in last 24 hours:  Blood pressure 128/86, pulse (!) 110, temperature 97.8 F (36.6 C), resp. rate 18, height _0  (1.626 m), weight 182 lb 1.6 oz (82.6 kg), SpO2 100 %.     Cardio: Regular rate and rhythm GI: No hepatosplenomegaly, no apparent ascites, nontender, no mass Vascular: No leg edema  Skin: Palms without erythema  Portacath/PICC-without erythema  Lab Results:  Lab Results  Component Value Date   WBC 4.7 01/04/2020   HGB 9.4 (L) 01/04/2020   HCT 29.3 (L) 01/04/2020   MCV 113.6 (H) 01/04/2020   PLT 135 (L) 01/04/2020   NEUTROABS 2.2 01/04/2020    CMP  Lab Results  Component Value Date   NA 138 12/13/2019   K 4.3 12/13/2019   CL 106 12/13/2019   CO2 22 12/13/2019   GLUCOSE 107 (H) 12/13/2019   BUN 15 12/13/2019   CREATININE 0.83 12/13/2019   CALCIUM 8.7 (L) 12/13/2019   PROT 7.0 12/13/2019   ALBUMIN 3.7 12/13/2019   AST 18 12/13/2019   ALT 21 12/13/2019   ALKPHOS 77 12/13/2019   BILITOT <0.2 (L) 12/13/2019   GFRNONAA >60 12/13/2019   GFRAA >60 12/13/2019    Lab Results  Component Value Date   CEA1 <1.00 06/27/2017     Medications: I have reviewed the patient's current medications.   Assessment/Plan: 1. Left abdomen/pelvic pain ? CT abdomen/pelvis 06/26/2017-soft tissue implants in the lower anterior peritoneum with implants at the paracolic gutters and left pelvic sidewall ? NegativeMYRIADhereditary cancer panel ? Elevated CA 125 ? CT biopsy of anterior omental mass 07/04/2017-Metastatic carcinoma consistent with a gynecologic primary ? 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 Cycle 1 adjuvant Taxol/carboplatin 08/05/2017 ? Cycle 2 adjuvant Taxol/carboplatin 08/26/2017 ? Cycle 3 adjuvant Taxol/carboplatin 09/17/2017-Taxol dose reduced secondary to neuropathy and bone pain ? Cycle 4 adjuvant Taxol/carboplatin 10/10/2017 ? Cycle 5 adjuvant Taxol/carboplatin 10/31/2017 ? Cycle 6 adjuvant Taxol/carboplatin 11/21/2017 ? CT abdomen/pelvis 08/08/2019-narrowing of rectosigmoid colon with eccentric serosal implant, 10 mm lymph node at the superior rectal vein, left upper quadrant soft tissue implant, 9 mm precaval node, 15 mm left internal iliac node, no ascites ? Cycle 1 Taxol/carboplatin 08/31/2019 ? Cycle 2 Taxol/carboplatin/Avastin 09/21/2019 ? Cycle 3 Taxol/Carboplatin 10/12/2019 (Avastin discontinued) ? Cycle 4 Taxol/carboplatin November 02, 2019 ? CT abdomen/pelvis 11/17/2019-decrease in size of previously prominent pericaval, left internal iliac, and superior rectal vein nodes, resolved rectosigmoid wall thickening ? Cycle 5 Taxol/carboplatin 11/22/2019 ? Cycle 6 Taxol/carboplatin 12/13/2019 ? Olaparib 01/05/2020  2. Depression  3. Hypothyroid  4. Family history of uterine cancer-paternal grandmother  13.Acute onset dyspnea/pleuritic left-sided chest pain 10/02/2019  CT chest 10/04/2019-left lower lobe pulmonary emboli, multifocal pneumonia lower lobes  On Xarelto, status post course of Levaquin.     Disposition: Anna [name] appears stable.  She has completed 6 cycles of salvage therapy with Taxol/carboplatin.  She has entered clinical remission.  We discussed observation versus  maintenance therapy.  The plan is to begin maintenance therapy with olaparib.  We reviewed potential toxicities associated with olaparib and she met with the Cancer center pharmacist.  She understands the chance of nausea, diarrhea, respiratory symptoms, and hematologic toxicity.  The plan is to begin olaparib tomorrow.  She will return for an office and lab visit in 2 weeks.  [name], MD  01/04/2020  9:12 AM

## 2020-01-05 LAB — CA 125: Cancer Antigen (CA) 125: 12.1 U/mL (ref 0.0–38.1)

## 2020-01-06 ENCOUNTER — Telehealth: Payer: Self-pay | Admitting: Oncology

## 2020-01-06 NOTE — Telephone Encounter (Signed)
Scheduled per los. Called and left msg. Mailed printout  °

## 2020-01-12 ENCOUNTER — Inpatient Hospital Stay (HOSPITAL_BASED_OUTPATIENT_CLINIC_OR_DEPARTMENT_OTHER): Payer: BC Managed Care – PPO | Admitting: Nurse Practitioner

## 2020-01-12 ENCOUNTER — Telehealth: Payer: Self-pay | Admitting: *Deleted

## 2020-01-12 ENCOUNTER — Ambulatory Visit (HOSPITAL_COMMUNITY)
Admission: RE | Admit: 2020-01-12 | Discharge: 2020-01-12 | Disposition: A | Discharge status: Home / Self Care | Payer: BC Managed Care – PPO | Source: Ambulatory Visit | Attending: Nurse Practitioner | Admitting: Nurse Practitioner

## 2020-01-12 ENCOUNTER — Inpatient Hospital Stay: Payer: BC Managed Care – PPO

## 2020-01-12 ENCOUNTER — Other Ambulatory Visit: Payer: Self-pay

## 2020-01-12 ENCOUNTER — Encounter: Payer: Self-pay | Admitting: Nurse Practitioner

## 2020-01-12 VITALS — BP 134/90 | HR 99 | Temp 98.1°F | Resp 18 | Ht 64.0 in | Wt 181.5 lb

## 2020-01-12 DIAGNOSIS — F329 Major depressive disorder, single episode, unspecified: Secondary | ICD-10-CM | POA: Diagnosis not present

## 2020-01-12 DIAGNOSIS — C5702 Malignant neoplasm of left fallopian tube: Secondary | ICD-10-CM

## 2020-01-12 DIAGNOSIS — Z8589 Personal history of malignant neoplasm of other organs and systems: Secondary | ICD-10-CM | POA: Diagnosis not present

## 2020-01-12 DIAGNOSIS — E039 Hypothyroidism, unspecified: Secondary | ICD-10-CM | POA: Diagnosis not present

## 2020-01-12 DIAGNOSIS — Z9221 Personal history of antineoplastic chemotherapy: Secondary | ICD-10-CM | POA: Diagnosis not present

## 2020-01-12 DIAGNOSIS — R079 Chest pain, unspecified: Secondary | ICD-10-CM | POA: Diagnosis not present

## 2020-01-12 DIAGNOSIS — R110 Nausea: Secondary | ICD-10-CM | POA: Diagnosis not present

## 2020-01-12 DIAGNOSIS — I2699 Other pulmonary embolism without acute cor pulmonale: Secondary | ICD-10-CM | POA: Diagnosis not present

## 2020-01-12 DIAGNOSIS — R5383 Other fatigue: Secondary | ICD-10-CM | POA: Diagnosis not present

## 2020-01-12 DIAGNOSIS — Z86711 Personal history of pulmonary embolism: Secondary | ICD-10-CM | POA: Diagnosis not present

## 2020-01-12 DIAGNOSIS — Z95828 Presence of other vascular implants and grafts: Secondary | ICD-10-CM

## 2020-01-12 LAB — CMP (CANCER CENTER ONLY)
ALT: 23 U/L (ref 0–44)
AST: 20 U/L (ref 15–41)
Albumin: 3.8 g/dL (ref 3.5–5.0)
Alkaline Phosphatase: 84 U/L (ref 38–126)
Anion gap: 10 (ref 5–15)
BUN: 13 mg/dL (ref 8–23)
CO2: 24 mmol/L (ref 22–32)
Calcium: 9.4 mg/dL (ref 8.9–10.3)
Chloride: 103 mmol/L (ref 98–111)
Creatinine: 0.87 mg/dL (ref 0.44–1.00)
GFR, Est AFR Am: >60 mL/min (ref >60)
GFR, Estimated: >60 mL/min (ref >60)
Glucose, Bld: 101 mg/dL — ABNORMAL HIGH (ref 70–99)
Potassium: 4.3 mmol/L (ref 3.5–5.1)
Sodium: 137 mmol/L (ref 135–145)
Total Bilirubin: 0.4 mg/dL (ref 0.3–1.2)
Total Protein: 7.9 g/dL (ref 6.5–8.1)

## 2020-01-12 LAB — CBC WITH DIFFERENTIAL (CANCER CENTER ONLY)
Abs Immature Granulocytes: 0.03 10*3/uL (ref 0.00–0.07)
Basophils Absolute: 0 10*3/uL (ref 0.0–0.1)
Basophils Relative: 0 %
Eosinophils Absolute: 0 10*3/uL (ref 0.0–0.5)
Eosinophils Relative: 0 %
HCT: 30.7 % — ABNORMAL LOW (ref 36.0–46.0)
Hemoglobin: 10.1 g/dL — ABNORMAL LOW (ref 12.0–15.0)
Immature Granulocytes: 1 %
Lymphocytes Relative: 34 %
Lymphs Abs: 1.9 10*3/uL (ref 0.7–4.0)
MCH: 37.1 pg — ABNORMAL HIGH (ref 26.0–34.0)
MCHC: 32.9 g/dL (ref 30.0–36.0)
MCV: 112.9 fL — ABNORMAL HIGH (ref 80.0–100.0)
Monocytes Absolute: 0.7 10*3/uL (ref 0.1–1.0)
Monocytes Relative: 12 %
Neutro Abs: 3 10*3/uL (ref 1.7–7.7)
Neutrophils Relative %: 53 %
Platelet Count: 159 10*3/uL (ref 150–400)
RBC: 2.72 MIL/uL — ABNORMAL LOW (ref 3.87–5.11)
RDW: 16.5 % — ABNORMAL HIGH (ref 11.5–15.5)
WBC Count: 5.6 10*3/uL (ref 4.0–10.5)
nRBC: 0.4 % — ABNORMAL HIGH (ref 0.0–0.2)

## 2020-01-12 MED ORDER — IOHEXOL 350 MG/ML SOLN
100.0000 mL | Freq: Once | INTRAVENOUS | Status: AC | PRN
  Administered 2020-01-12: 100 mL via INTRAVENOUS

## 2020-01-12 MED ORDER — HEPARIN SOD (PORK) LOCK FLUSH 100 UNIT/ML IV SOLN
500.0000 [IU] | Freq: Once | INTRAVENOUS | Status: AC
  Administered 2020-01-12: 500 [IU] via INTRAVENOUS

## 2020-01-12 MED ORDER — SODIUM CHLORIDE 0.9% FLUSH
10.0000 mL | INTRAVENOUS | Status: DC | PRN
  Administered 2020-01-12: 10 mL
  Filled 2020-01-12: qty 10

## 2020-01-12 NOTE — Telephone Encounter (Signed)
Has developed pain in right chest wall with deep breath that radiates into her right shoulder and base of neck. Had to sleep sitting up last night. No fever or cough. Per Dr. Benay Spice: Come to office now to be seen by NP. Patient agrees.

## 2020-01-12 NOTE — Progress Notes (Addendum)
[place] OFFICE PROGRESS NOTE   Diagnosis: Fallopian tube carcinoma  INTERVAL HISTORY:   Anna [name] returns prior to scheduled follow-up for evaluation of chest pain.  She completed cycle 6 Taxol/carboplatin 12/13/2019.  She began maintenance olaparib 01/05/2020.  She reports developing pain at the right mid back on 01/09/2020.  The pain improved as the day progressed.  On 01/10/2020 she had no pain.  On 01/11/2020 she noted right mid back pain with deep inspiration radiating to the right shoulder blade and neck.  The pain has persisted.  Last night she was unable to lay on the right side due to pain and shortness of breath.  She slept in a recliner.  Symptoms are the same today.  No neck or arm swelling.  No fever.  No significant cough.  No leg swelling or calf pain.  No vomiting.  Mild intermittent nausea since beginning olaparib.  No constipation or diarrhea.  Objective:  Vital signs in last 24 hours:  Blood pressure 134/90, pulse 99, temperature 98.1 F (36.7 C), temperature source Temporal, resp. rate 18, height 5' 4"  (1.626 m), weight 181 lb 8 oz (82.3 kg), SpO2 100 %.    Lymphatics: No palpable cervical, supraclavicular or axillary lymph nodes. Resp: No respiratory distress.  Faint crackles left lung base.  Lungs otherwise clear. Cardio: Regular rate and rhythm. GI: Abdomen soft and nontender.  No hepatosplenomegaly. Vascular: No leg edema.  Calves soft and nontender.  No arm edema. Neuro: Alert and oriented. Skin: Warm and dry.  No rash. Port-A-Cath without erythema.   Lab Results:  Lab Results  Component Value Date   WBC 5.6 01/12/2020   HGB 10.1 (L) 01/12/2020   HCT 30.7 (L) 01/12/2020   MCV 112.9 (H) 01/12/2020   PLT 159 01/12/2020   NEUTROABS 3.0 01/12/2020    Imaging:  CT ANGIO CHEST PE W OR WO CONTRAST  Result Date: 01/12/2020 CLINICAL DATA:  History of PE, recent onset pleuritic right-sided chest pain, dyspnea, history of fallopian tube  cancer EXAM: CT ANGIOGRAPHY CHEST WITH CONTRAST TECHNIQUE: Multidetector CT imaging of the chest was performed using the standard protocol during bolus administration of intravenous contrast. Multiplanar CT image reconstructions and MIPs were obtained to evaluate the vascular anatomy. CONTRAST:  164m OMNIPAQUE IOHEXOL 350 MG/ML SOLN COMPARISON:  10/04/2019 FINDINGS: Cardiovascular: Right chest port catheter. Satisfactory opacification of the pulmonary arteries to the segmental level. There is redemonstrated pulmonary embolus, reduced in volume compared to prior examination dated 10/04/1999 20, most notable in the segmental to subsegmental branches of the left lower lobe (series 5, image 46) although likely also present in the subsegmental branches of the right lower lobe. No new pulmonary embolus identified. Normal heart size. No pericardial effusion. Mediastinum/Nodes: No enlarged mediastinal, hilar, or axillary lymph nodes. Thyroid gland, trachea, and esophagus demonstrate no significant findings. Lungs/Pleura: Bibasilar partial atelectasis and/or scarring, with mosaic attenuation of the airspaces in the lung bases (series 7, image 83). No pleural effusion or pneumothorax. Upper Abdomen: No acute abnormality. Hepatic steatosis. Musculoskeletal: No chest wall abnormality. No acute or significant osseous findings. Review of the MIP images confirms the above findings. IMPRESSION: 1. Interval decrease in volume of previously noted pulmonary embolus, most notable in the segmental to subsegmental branches of the left lower lobe. No new pulmonary embolus identified. 2. Bibasilar partial atelectasis and/or scarring, with mosaic attenuation of the airspaces in the lung bases, likely related to differential perfusion given the presence of pulmonary embolus. 3. Hepatic steatosis. Electronically Signed  By: Eddie Candle M.D.   On: 01/12/2020 12:08    Medications: I have reviewed the patient's current  medications.  Assessment/Plan: 1. Left abdomen/pelvic pain ? CT abdomen/pelvis 06/26/2017-soft tissue implants in the lower anterior peritoneum with implants at the paracolic gutters and left pelvic sidewall ? NegativeMYRIADhereditary cancer panel ? Elevated CA 125 ? CT biopsy of anterior omental mass 07/04/2017-Metastatic carcinoma consistent with a gynecologic primary ? 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 Cycle 1 adjuvant Taxol/carboplatin 08/05/2017 ? Cycle 2 adjuvant Taxol/carboplatin 08/26/2017 ? Cycle 3 adjuvant Taxol/carboplatin 09/17/2017-Taxol dose reduced secondary to neuropathy and bone pain ? Cycle 4 adjuvant Taxol/carboplatin 10/10/2017 ? Cycle 5 adjuvant Taxol/carboplatin 10/31/2017 ? Cycle 6 adjuvant Taxol/carboplatin 11/21/2017 ? CT abdomen/pelvis 08/08/2019-narrowing of rectosigmoid colon with eccentric serosal implant, 10 mm lymph node at the superior rectal vein, left upper quadrant soft tissue implant, 9 mm precaval node, 15 mm left internal iliac node, no ascites ? Cycle 1 Taxol/carboplatin 08/31/2019 ? Cycle 2 Taxol/carboplatin/Avastin 09/21/2019 ? Cycle 3 Taxol/Carboplatin 10/12/2019 (Avastin discontinued) ? Cycle 4 Taxol/carboplatin November 02, 2019 ? CT abdomen/pelvis 11/17/2019-decrease in size of previously prominent pericaval, left internal iliac, and superior rectal vein nodes, resolved rectosigmoid wall thickening ? Cycle 5 Taxol/carboplatin 11/22/2019 ? Cycle 6 Taxol/carboplatin 12/13/2019 ? Olaparib 01/05/2020  2. Depression  3. Hypothyroid  4. Family history of uterine  cancer-paternal grandmother  69.Acute onset dyspnea/pleuritic left-sided chest pain 10/02/2019  CT chest 10/04/2019-left lower lobe pulmonary emboli, multifocal pneumonia lower lobes  On Xarelto, status post course of Levaquin.   Disposition: Anna [name] has metastatic fallopian tube cancer.  She began maintenance olaparib 01/05/2020.  She presents today with pleuritic type pain right back radiating to the neck and shoulder.  Etiology unclear.  We discussed the possibility of a pulmonary embolus though she is on Xarelto and has been very compliant.  We also the possibility of pneumonitis.  She may have fractured a rib.  We also discussed shingles.  Stat chest CT unrevealing as to the etiology of her symptoms. The previous pulmonary embolus is smaller and no new pulmonary embolus identified. There is no evidence of a rib fracture, no pneumonia. She will try ibuprofen for a few days. We will follow-up with her by telephone later this week. She understands to contact the office if symptoms worsen/seek evaluation in the emergency department if after hours. Otherwise she will follow-up as scheduled next week.  Patient seen with Dr. Benay Spice.    Ned Card ANP/GNP-BC   01/12/2020  12:50 PM  This was a shared visit with Ned Card.  Anna [name] was interviewed and examined.  She presents today with new onset pleuritic right chest discomfort.  The etiology is unclear.  The chest CT reveals no acute finding.  She will continue Xarelto anticoagulation.  She will call for increased pain or new symptoms.  Anna Manson, MD

## 2020-01-13 ENCOUNTER — Encounter: Payer: Self-pay | Admitting: Internal Medicine

## 2020-01-13 ENCOUNTER — Telehealth: Payer: Self-pay | Admitting: *Deleted

## 2020-01-13 ENCOUNTER — Other Ambulatory Visit: Payer: Self-pay | Admitting: Internal Medicine

## 2020-01-13 MED FILL — LYNPARZA 150 MG TABS: 150 | 15 days supply | Qty: 60 | Fill #1

## 2020-01-13 MED FILL — XARELTO 20 MG TABLET: 20 | 90 days supply | Qty: 90 | Fill #1

## 2020-01-13 NOTE — Telephone Encounter (Signed)
TCT patient today to check on her status as she was not feeling well yesterday with pleuritic pain in the right side of her back, radiating to her neck and shoulder. CT done yesterday-did not reveal cause of her pain. No answer to call. Able to leave vm message for patient to call to let us know how she is doing.

## 2020-01-13 NOTE — Telephone Encounter (Signed)
-----   Message from Owens Shark, NP sent at 01/12/2020 12:51 PM EST ----- Please call her tomorrow morning, 01/13/2020, to follow-up on visit from today.

## 2020-01-13 NOTE — Telephone Encounter (Signed)
Received vm message from patient stating that she received my message from this morning. She states she is feeling somewhat better with taking the Ibuprofen.  She slept well last night. No other s/s were mentioned.

## 2020-01-14 MED ORDER — LISINOPRIL 20 MG PO TABS: 20.0000 mg | ORAL_TABLET | Freq: Every day | ORAL | 1 refills | Status: DC

## 2020-01-18 ENCOUNTER — Other Ambulatory Visit: Payer: Self-pay

## 2020-01-18 ENCOUNTER — Inpatient Hospital Stay: Payer: BC Managed Care – PPO

## 2020-01-18 ENCOUNTER — Inpatient Hospital Stay: Payer: BC Managed Care – PPO | Attending: Oncology | Admitting: Nurse Practitioner

## 2020-01-18 ENCOUNTER — Telehealth: Payer: Self-pay | Admitting: Oncology

## 2020-01-18 ENCOUNTER — Encounter: Payer: Self-pay | Admitting: Nurse Practitioner

## 2020-01-18 VITALS — BP 127/76 | HR 94 | Temp 98.0°F | Resp 18 | Ht 64.0 in | Wt 180.7 lb

## 2020-01-18 DIAGNOSIS — M542 Cervicalgia: Secondary | ICD-10-CM | POA: Diagnosis not present

## 2020-01-18 DIAGNOSIS — E039 Hypothyroidism, unspecified: Secondary | ICD-10-CM | POA: Insufficient documentation

## 2020-01-18 DIAGNOSIS — R110 Nausea: Secondary | ICD-10-CM | POA: Diagnosis not present

## 2020-01-18 DIAGNOSIS — Z7901 Long term (current) use of anticoagulants: Secondary | ICD-10-CM | POA: Diagnosis not present

## 2020-01-18 DIAGNOSIS — C5700 Malignant neoplasm of unspecified fallopian tube: Secondary | ICD-10-CM | POA: Insufficient documentation

## 2020-01-18 DIAGNOSIS — Z86711 Personal history of pulmonary embolism: Secondary | ICD-10-CM | POA: Insufficient documentation

## 2020-01-18 DIAGNOSIS — F329 Major depressive disorder, single episode, unspecified: Secondary | ICD-10-CM | POA: Diagnosis not present

## 2020-01-18 DIAGNOSIS — C5702 Malignant neoplasm of left fallopian tube: Secondary | ICD-10-CM | POA: Diagnosis not present

## 2020-01-18 DIAGNOSIS — Z9221 Personal history of antineoplastic chemotherapy: Secondary | ICD-10-CM | POA: Insufficient documentation

## 2020-01-18 DIAGNOSIS — Z95828 Presence of other vascular implants and grafts: Secondary | ICD-10-CM

## 2020-01-18 LAB — CBC WITH DIFFERENTIAL (CANCER CENTER ONLY)
Abs Immature Granulocytes: 0.02 10*3/uL (ref 0.00–0.07)
Basophils Absolute: 0 10*3/uL (ref 0.0–0.1)
Basophils Relative: 0 %
Eosinophils Absolute: 0 10*3/uL (ref 0.0–0.5)
Eosinophils Relative: 0 %
HCT: 29.4 % — ABNORMAL LOW (ref 36.0–46.0)
Hemoglobin: 9.6 g/dL — ABNORMAL LOW (ref 12.0–15.0)
Immature Granulocytes: 0 %
Lymphocytes Relative: 43 %
Lymphs Abs: 2.1 10*3/uL (ref 0.7–4.0)
MCH: 37.2 pg — ABNORMAL HIGH (ref 26.0–34.0)
MCHC: 32.7 g/dL (ref 30.0–36.0)
MCV: 114 fL — ABNORMAL HIGH (ref 80.0–100.0)
Monocytes Absolute: 0.4 10*3/uL (ref 0.1–1.0)
Monocytes Relative: 9 %
Neutro Abs: 2.3 10*3/uL (ref 1.7–7.7)
Neutrophils Relative %: 48 %
Platelet Count: 149 10*3/uL — ABNORMAL LOW (ref 150–400)
RBC: 2.58 MIL/uL — ABNORMAL LOW (ref 3.87–5.11)
RDW: 16 % — ABNORMAL HIGH (ref 11.5–15.5)
WBC Count: 4.8 10*3/uL (ref 4.0–10.5)
nRBC: 0.4 % — ABNORMAL HIGH (ref 0.0–0.2)

## 2020-01-18 LAB — CMP (CANCER CENTER ONLY)
ALT: 23 U/L (ref 0–44)
AST: 21 U/L (ref 15–41)
Albumin: 3.7 g/dL (ref 3.5–5.0)
Alkaline Phosphatase: 70 U/L (ref 38–126)
Anion gap: 11 (ref 5–15)
BUN: 17 mg/dL (ref 8–23)
CO2: 25 mmol/L (ref 22–32)
Calcium: 9.1 mg/dL (ref 8.9–10.3)
Chloride: 105 mmol/L (ref 98–111)
Creatinine: 0.93 mg/dL (ref 0.44–1.00)
GFR, Est AFR Am: >60 mL/min (ref >60)
GFR, Estimated: >60 mL/min (ref >60)
Glucose, Bld: 106 mg/dL — ABNORMAL HIGH (ref 70–99)
Potassium: 4 mmol/L (ref 3.5–5.1)
Sodium: 141 mmol/L (ref 135–145)
Total Bilirubin: 0.3 mg/dL (ref 0.3–1.2)
Total Protein: 7.6 g/dL (ref 6.5–8.1)

## 2020-01-18 MED ORDER — SODIUM CHLORIDE 0.9% FLUSH
10.0000 mL | INTRAVENOUS | Status: DC | PRN
  Administered 2020-01-18: 10 mL
  Filled 2020-01-18: qty 10

## 2020-01-18 MED ORDER — HEPARIN SOD (PORK) LOCK FLUSH 100 UNIT/ML IV SOLN
500.0000 [IU] | Freq: Once | INTRAVENOUS | Status: AC | PRN
  Administered 2020-01-18: 500 [IU]
  Filled 2020-01-18: qty 5

## 2020-01-18 NOTE — Progress Notes (Signed)
  [place] OFFICE PROGRESS NOTE   Diagnosis: Fallopian tube carcinoma  INTERVAL HISTORY:   Ms. Bandel returns as scheduled.  She continues olaparib.  The pain she was experiencing last week has improved.  She is now having occasional pain at the right shoulder/neck.  No shortness of breath.  She has periodic nausea.  No vomiting.  No mouth sores.  No diarrhea.  No rash.  Objective:  Vital signs in last 24 hours:  Blood pressure 127/76, pulse 94, temperature 98 F (36.7 C), temperature source Oral, resp. rate 18, height '5\' 4"'$  (1.626 m), weight 180 lb 11.2 oz (82 kg), SpO2 99 %.    HEENT: No thrush or ulcers. Resp: Lungs clear bilaterally. Cardio: Regular rate and rhythm. GI: Abdomen soft and nontender.  No hepatomegaly. Vascular: No leg edema. Neuro: Alert and oriented. Skin: No rash. Port-A-Cath without erythema.   Lab Results:  Lab Results  Component Value Date   WBC 4.8 01/18/2020   HGB 9.6 (L) 01/18/2020   HCT 29.4 (L) 01/18/2020   MCV 114.0 (H) 01/18/2020   PLT 149 (L) 01/18/2020   NEUTROABS 2.3 01/18/2020    Imaging:  No results found.  Medications: I have reviewed the patient's current medications.  Assessment/Plan: 1. Left abdomen/pelvic pain ? CT abdomen/pelvis 06/26/2017-soft tissue implants in the lower anterior peritoneum with implants at the paracolic gutters and left pelvic sidewall ? NegativeMYRIADhereditary cancer panel ? Elevated CA 125 ? CT biopsy of anterior omental mass 07/04/2017-Metastatic carcinoma consistent with a gynecologic primary ? 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 Cycle 1 adjuvant Taxol/carboplatin 08/05/2017 ? Cycle 2 adjuvant Taxol/carboplatin 08/26/2017 ? Cycle 3 adjuvant Taxol/carboplatin 09/17/2017-Taxol dose reduced secondary to neuropathy and bone pain ? Cycle 4 adjuvant Taxol/carboplatin 10/10/2017 ? Cycle 5 adjuvant Taxol/carboplatin 10/31/2017 ? Cycle 6 adjuvant Taxol/carboplatin 11/21/2017 ? CT abdomen/pelvis 08/08/2019-narrowing of rectosigmoid colon with eccentric serosal implant, 10 mm lymph node at the superior rectal vein, left upper quadrant soft tissue implant, 9 mm precaval node, 15 mm left internal iliac node, no ascites ? Cycle 1 Taxol/carboplatin 08/31/2019 ? Cycle 2 Taxol/carboplatin/Avastin 09/21/2019 ? Cycle 3 Taxol/Carboplatin 10/12/2019 (Avastin discontinued) ? Cycle 4 Taxol/carboplatin November 02, 2019 ? CT abdomen/pelvis 11/17/2019-decrease in size of previously prominent pericaval, left internal iliac, and superior rectal vein nodes, resolved rectosigmoid wall thickening ? Cycle 5 Taxol/carboplatin 11/22/2019 ? Cycle 6 Taxol/carboplatin 12/13/2019 ? Olaparib2/17/2021  2. Depression  3. Hypothyroid  4. Family history of uterine cancer-paternal grandmother  62.Acute onset dyspnea/pleuritic left-sided chest pain 10/02/2019  CT chest 10/04/2019-left lower lobe pulmonary emboli, multifocal pneumonia lower lobes  On Xarelto, status post course of Levaquin.   Disposition: Ms. Stempel appears stable.  She continues olaparib.  Aside from mild nausea she seems to be tolerating it well.  We reviewed the CBC and chemistry panel from today.  Labs are adequate to continue olaparib.  The right back/neck/shoulder pain she was experiencing last week is significantly better.  She will return for lab and follow-up in 2 weeks.  She will contact the office in the interim with any problems.      Ned Card ANP/GNP-BC   01/18/2020  3:44 PM

## 2020-01-18 NOTE — Telephone Encounter (Signed)
Scheduled per los. Patient declined printout  

## 2020-01-27 ENCOUNTER — Other Ambulatory Visit: Payer: Self-pay | Admitting: Oncology

## 2020-02-01 ENCOUNTER — Inpatient Hospital Stay (HOSPITAL_BASED_OUTPATIENT_CLINIC_OR_DEPARTMENT_OTHER): Payer: BC Managed Care – PPO | Admitting: Oncology

## 2020-02-01 ENCOUNTER — Inpatient Hospital Stay: Payer: BC Managed Care – PPO

## 2020-02-01 ENCOUNTER — Telehealth: Payer: Self-pay | Admitting: Oncology

## 2020-02-01 ENCOUNTER — Other Ambulatory Visit: Payer: Self-pay

## 2020-02-01 VITALS — BP 149/79 | HR 90 | Temp 97.9°F | Resp 18 | Ht 64.0 in | Wt 185.0 lb

## 2020-02-01 DIAGNOSIS — Z9221 Personal history of antineoplastic chemotherapy: Secondary | ICD-10-CM | POA: Diagnosis not present

## 2020-02-01 DIAGNOSIS — E039 Hypothyroidism, unspecified: Secondary | ICD-10-CM | POA: Diagnosis not present

## 2020-02-01 DIAGNOSIS — M542 Cervicalgia: Secondary | ICD-10-CM | POA: Diagnosis not present

## 2020-02-01 DIAGNOSIS — F329 Major depressive disorder, single episode, unspecified: Secondary | ICD-10-CM | POA: Diagnosis not present

## 2020-02-01 DIAGNOSIS — C5702 Malignant neoplasm of left fallopian tube: Secondary | ICD-10-CM | POA: Diagnosis not present

## 2020-02-01 DIAGNOSIS — Z7901 Long term (current) use of anticoagulants: Secondary | ICD-10-CM | POA: Diagnosis not present

## 2020-02-01 DIAGNOSIS — C5700 Malignant neoplasm of unspecified fallopian tube: Secondary | ICD-10-CM | POA: Diagnosis not present

## 2020-02-01 DIAGNOSIS — R110 Nausea: Secondary | ICD-10-CM | POA: Diagnosis not present

## 2020-02-01 DIAGNOSIS — Z95828 Presence of other vascular implants and grafts: Secondary | ICD-10-CM

## 2020-02-01 DIAGNOSIS — Z86711 Personal history of pulmonary embolism: Secondary | ICD-10-CM | POA: Diagnosis not present

## 2020-02-01 LAB — CMP (CANCER CENTER ONLY)
ALT: 17 U/L (ref 0–44)
AST: 18 U/L (ref 15–41)
Albumin: 3.5 g/dL (ref 3.5–5.0)
Alkaline Phosphatase: 69 U/L (ref 38–126)
Anion gap: 11 (ref 5–15)
BUN: 11 mg/dL (ref 8–23)
CO2: 22 mmol/L (ref 22–32)
Calcium: 8.6 mg/dL — ABNORMAL LOW (ref 8.9–10.3)
Chloride: 107 mmol/L (ref 98–111)
Creatinine: 0.88 mg/dL (ref 0.44–1.00)
GFR, Est AFR Am: >60 mL/min (ref >60)
GFR, Estimated: >60 mL/min (ref >60)
Glucose, Bld: 104 mg/dL — ABNORMAL HIGH (ref 70–99)
Potassium: 3.7 mmol/L (ref 3.5–5.1)
Sodium: 140 mmol/L (ref 135–145)
Total Bilirubin: 0.3 mg/dL (ref 0.3–1.2)
Total Protein: 6.7 g/dL (ref 6.5–8.1)

## 2020-02-01 LAB — CBC WITH DIFFERENTIAL (CANCER CENTER ONLY)
Abs Immature Granulocytes: 0.02 10*3/uL (ref 0.00–0.07)
Basophils Absolute: 0 10*3/uL (ref 0.0–0.1)
Basophils Relative: 1 %
Eosinophils Absolute: 0 10*3/uL (ref 0.0–0.5)
Eosinophils Relative: 1 %
HCT: 26.7 % — ABNORMAL LOW (ref 36.0–46.0)
Hemoglobin: 8.6 g/dL — ABNORMAL LOW (ref 12.0–15.0)
Immature Granulocytes: 1 %
Lymphocytes Relative: 34 %
Lymphs Abs: 1.5 10*3/uL (ref 0.7–4.0)
MCH: 37.2 pg — ABNORMAL HIGH (ref 26.0–34.0)
MCHC: 32.2 g/dL (ref 30.0–36.0)
MCV: 115.6 fL — ABNORMAL HIGH (ref 80.0–100.0)
Monocytes Absolute: 0.5 10*3/uL (ref 0.1–1.0)
Monocytes Relative: 12 %
Neutro Abs: 2.2 10*3/uL (ref 1.7–7.7)
Neutrophils Relative %: 51 %
Platelet Count: 153 10*3/uL (ref 150–400)
RBC: 2.31 MIL/uL — ABNORMAL LOW (ref 3.87–5.11)
RDW: 16.3 % — ABNORMAL HIGH (ref 11.5–15.5)
WBC Count: 4.3 10*3/uL (ref 4.0–10.5)
nRBC: 0.5 % — ABNORMAL HIGH (ref 0.0–0.2)

## 2020-02-01 LAB — MAGNESIUM: Magnesium: 1.5 mg/dL — ABNORMAL LOW (ref 1.7–2.4)

## 2020-02-01 MED ORDER — SODIUM CHLORIDE 0.9% FLUSH
10.0000 mL | INTRAVENOUS | Status: DC | PRN
  Administered 2020-02-01: 10 mL
  Filled 2020-02-01: qty 10

## 2020-02-01 MED ORDER — HEPARIN SOD (PORK) LOCK FLUSH 100 UNIT/ML IV SOLN
500.0000 [IU] | Freq: Once | INTRAVENOUS | Status: AC | PRN
  Administered 2020-02-01: 500 [IU]
  Filled 2020-02-01: qty 5

## 2020-02-01 MED FILL — LYNPARZA 150 MG TABS: 150 | 15 days supply | Qty: 60 | Fill #0

## 2020-02-01 NOTE — Telephone Encounter (Signed)
Scheduled appts per 3/16 sch msg. Pt declined AVS and stated she would refer to mychart.

## 2020-02-01 NOTE — Progress Notes (Signed)
Anna [name]   Diagnosis: Fallopian tube carcinoma  INTERVAL HISTORY:   Anna [name] returns as scheduled.  She continues olaparib.  She has mild nausea, relieved with peppermint.  No diarrhea, rash, or abdominal pain.  Peripheral neuropathy symptoms have improved.  The right back and shoulder pain has resolved.  Objective:  Vital signs in last 24 hours:  Blood pressure (!) 149/79, pulse 90, temperature 97.9 F (36.6 C), temperature source Temporal, resp. rate 18, height 5' 4"  (1.626 m), weight 185 lb (83.9 kg), SpO2 100 %.    GI: No mass, nontender, no apparent ascites, no hepatosplenomegaly Vascular: No leg edema  Skin: Palms without erythema  Portacath/PICC-without erythema  Lab Results:  Lab Results  Component Value Date   WBC 4.3 02/01/2020   HGB 8.6 (L) 02/01/2020   HCT 26.7 (L) 02/01/2020   MCV 115.6 (H) 02/01/2020   PLT 153 02/01/2020   NEUTROABS 2.2 02/01/2020    CMP  Lab Results  Component Value Date   NA 140 02/01/2020   K 3.7 02/01/2020   CL 107 02/01/2020   CO2 22 02/01/2020   GLUCOSE 104 (H) 02/01/2020   BUN 11 02/01/2020   CREATININE 0.88 02/01/2020   CALCIUM 8.6 (L) 02/01/2020   PROT 6.7 02/01/2020   ALBUMIN 3.5 02/01/2020   AST 18 02/01/2020   ALT 17 02/01/2020   ALKPHOS 69 02/01/2020   BILITOT 0.3 02/01/2020   GFRNONAA >60 02/01/2020   GFRAA >60 02/01/2020    Lab Results  Component Value Date   CEA1 <1.00 06/27/2017    Medications: I have reviewed the patient's current medications.   Assessment/Plan: 1. Left abdomen/pelvic pain ? CT abdomen/pelvis 06/26/2017-soft tissue implants in the lower anterior peritoneum with implants at the paracolic gutters and left pelvic sidewall ? NegativeMYRIADhereditary cancer panel ? Elevated CA 125 ? CT biopsy of anterior omental mass 07/04/2017-Metastatic carcinoma consistent with a gynecologic primary ? 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 Cycle 1 adjuvant Taxol/carboplatin 08/05/2017 ? Cycle 2 adjuvant Taxol/carboplatin 08/26/2017 ? Cycle 3 adjuvant Taxol/carboplatin 09/17/2017-Taxol dose reduced secondary to neuropathy and bone pain ? Cycle 4 adjuvant Taxol/carboplatin 10/10/2017 ? Cycle 5 adjuvant Taxol/carboplatin 10/31/2017 ? Cycle 6 adjuvant Taxol/carboplatin 11/21/2017 ? CT abdomen/pelvis 08/08/2019-narrowing of rectosigmoid colon with eccentric serosal implant, 10 mm lymph node at the superior rectal vein, left upper quadrant soft tissue implant, 9 mm precaval node, 15 mm left internal iliac node, no ascites ? Cycle 1 Taxol/carboplatin 08/31/2019 ? Cycle 2 Taxol/carboplatin/Avastin 09/21/2019 ? Cycle 3 Taxol/Carboplatin 10/12/2019 (Avastin discontinued) ? Cycle 4 Taxol/carboplatin November 02, 2019 ? CT abdomen/pelvis 11/17/2019-decrease in size of previously prominent pericaval, left internal iliac, and superior rectal vein nodes, resolved rectosigmoid wall thickening ? Cycle 5 Taxol/carboplatin 11/22/2019 ? Cycle 6 Taxol/carboplatin 12/13/2019 ? Olaparib2/17/2021  2. Depression  3. Hypothyroid  4. Family history of uterine cancer-paternal grandmother  89.Acute onset dyspnea/pleuritic left-sided chest pain 10/02/2019  CT chest 10/04/2019-left lower lobe pulmonary emboli, multifocal pneumonia lower lobes  On Xarelto, status post course of Levaquin.     Disposition: Anna [name] appears well.  She is tolerating the Olaparib without significant toxicity.  She has progressive anemia secondary to the course of chemotherapy.  She denies bleeding.  She  will return for a follow-up visit and CBC in 2 weeks.  [name], MD  02/01/2020  10:07 AM

## 2020-02-02 ENCOUNTER — Telehealth: Payer: Self-pay

## 2020-02-02 ENCOUNTER — Encounter: Payer: Self-pay | Admitting: Oncology

## 2020-02-02 LAB — CA 125: Cancer Antigen (CA) 125: 11.6 U/mL (ref 0.0–38.1)

## 2020-02-02 NOTE — Telephone Encounter (Signed)
Spoke with patient regarding her magnesium level. Patient verbalizes understanding to take Magnesium oxide 400mg  PO BID. Pt appreciative of call.

## 2020-02-02 NOTE — Telephone Encounter (Signed)
-----   Message from Ladell Pier, MD sent at 02/01/2020  8:08 PM EDT ----- Please call patient, magnesium is low, can be a side effect from the olaparib, start magnesium oxide 400 mg twice daily, repeat magnesium level with next visit

## 2020-02-11 ENCOUNTER — Other Ambulatory Visit: Payer: Self-pay | Admitting: Oncology

## 2020-02-14 MED FILL — LYNPARZA 150 MG TABLET: 150 | 30 days supply | Qty: 120 | Fill #0

## 2020-02-15 ENCOUNTER — Inpatient Hospital Stay: Payer: BC Managed Care – PPO

## 2020-02-15 ENCOUNTER — Other Ambulatory Visit: Payer: Self-pay

## 2020-02-15 ENCOUNTER — Telehealth: Payer: Self-pay | Admitting: Oncology

## 2020-02-15 ENCOUNTER — Inpatient Hospital Stay (HOSPITAL_BASED_OUTPATIENT_CLINIC_OR_DEPARTMENT_OTHER): Payer: BC Managed Care – PPO | Admitting: Oncology

## 2020-02-15 VITALS — BP 104/62 | HR 99 | Temp 98.5°F | Resp 17 | Ht 64.0 in | Wt 180.6 lb

## 2020-02-15 DIAGNOSIS — M542 Cervicalgia: Secondary | ICD-10-CM | POA: Diagnosis not present

## 2020-02-15 DIAGNOSIS — Z9221 Personal history of antineoplastic chemotherapy: Secondary | ICD-10-CM | POA: Diagnosis not present

## 2020-02-15 DIAGNOSIS — C5702 Malignant neoplasm of left fallopian tube: Secondary | ICD-10-CM

## 2020-02-15 DIAGNOSIS — Z7901 Long term (current) use of anticoagulants: Secondary | ICD-10-CM | POA: Diagnosis not present

## 2020-02-15 DIAGNOSIS — C5700 Malignant neoplasm of unspecified fallopian tube: Secondary | ICD-10-CM | POA: Diagnosis not present

## 2020-02-15 DIAGNOSIS — Z95828 Presence of other vascular implants and grafts: Secondary | ICD-10-CM

## 2020-02-15 DIAGNOSIS — Z86711 Personal history of pulmonary embolism: Secondary | ICD-10-CM | POA: Diagnosis not present

## 2020-02-15 DIAGNOSIS — F329 Major depressive disorder, single episode, unspecified: Secondary | ICD-10-CM | POA: Diagnosis not present

## 2020-02-15 DIAGNOSIS — R110 Nausea: Secondary | ICD-10-CM | POA: Diagnosis not present

## 2020-02-15 DIAGNOSIS — E039 Hypothyroidism, unspecified: Secondary | ICD-10-CM | POA: Diagnosis not present

## 2020-02-15 LAB — CMP (CANCER CENTER ONLY)
ALT: 21 U/L (ref 0–44)
AST: 20 U/L (ref 15–41)
Albumin: 3.7 g/dL (ref 3.5–5.0)
Alkaline Phosphatase: 75 U/L (ref 38–126)
Anion gap: 9 (ref 5–15)
BUN: 13 mg/dL (ref 8–23)
CO2: 22 mmol/L (ref 22–32)
Calcium: 9.1 mg/dL (ref 8.9–10.3)
Chloride: 104 mmol/L (ref 98–111)
Creatinine: 1.07 mg/dL — ABNORMAL HIGH (ref 0.44–1.00)
GFR, Est AFR Am: >60 mL/min (ref >60)
GFR, Estimated: 55 mL/min — ABNORMAL LOW (ref >60)
Glucose, Bld: 104 mg/dL — ABNORMAL HIGH (ref 70–99)
Potassium: 4.3 mmol/L (ref 3.5–5.1)
Sodium: 135 mmol/L (ref 135–145)
Total Bilirubin: 0.4 mg/dL (ref 0.3–1.2)
Total Protein: 7.4 g/dL (ref 6.5–8.1)

## 2020-02-15 LAB — CBC WITH DIFFERENTIAL (CANCER CENTER ONLY)
Abs Immature Granulocytes: 0.05 10*3/uL (ref 0.00–0.07)
Basophils Absolute: 0 10*3/uL (ref 0.0–0.1)
Basophils Relative: 1 %
Eosinophils Absolute: 0 10*3/uL (ref 0.0–0.5)
Eosinophils Relative: 1 %
HCT: 30.5 % — ABNORMAL LOW (ref 36.0–46.0)
Hemoglobin: 9.9 g/dL — ABNORMAL LOW (ref 12.0–15.0)
Immature Granulocytes: 1 %
Lymphocytes Relative: 29 %
Lymphs Abs: 1.7 10*3/uL (ref 0.7–4.0)
MCH: 38.1 pg — ABNORMAL HIGH (ref 26.0–34.0)
MCHC: 32.5 g/dL (ref 30.0–36.0)
MCV: 117.3 fL — ABNORMAL HIGH (ref 80.0–100.0)
Monocytes Absolute: 0.6 10*3/uL (ref 0.1–1.0)
Monocytes Relative: 10 %
Neutro Abs: 3.4 10*3/uL (ref 1.7–7.7)
Neutrophils Relative %: 58 %
Platelet Count: 188 10*3/uL (ref 150–400)
RBC: 2.6 MIL/uL — ABNORMAL LOW (ref 3.87–5.11)
RDW: 16.3 % — ABNORMAL HIGH (ref 11.5–15.5)
WBC Count: 5.8 10*3/uL (ref 4.0–10.5)
nRBC: 0.5 % — ABNORMAL HIGH (ref 0.0–0.2)

## 2020-02-15 LAB — MAGNESIUM: Magnesium: 1.8 mg/dL (ref 1.7–2.4)

## 2020-02-15 MED ORDER — SODIUM CHLORIDE 0.9% FLUSH
10.0000 mL | INTRAVENOUS | Status: DC | PRN
  Administered 2020-02-15: 10 mL
  Filled 2020-02-15: qty 10

## 2020-02-15 NOTE — Patient Instructions (Signed)
Please provide office a copy of your medical advanced directive when available to scan into your record.

## 2020-02-15 NOTE — Progress Notes (Signed)
[facility] [name] OFFICE PROGRESS NOTE   Diagnosis: Fallopian tube carcinoma  INTERVAL HISTORY:   Anna [name] returns for a scheduled visit.  She continues olaparib.  She was started on a magnesium supplement 02/01/2020.  She reports nausea, fatigue, and frequent bowel movements since beginning the magnesium.  No pain.  No other complaint.  She has a bowel movement each time she urinates.  Objective:  Vital signs in last 24 hours:  Blood pressure 104/62, pulse 99, temperature 98.5 F (36.9 C), temperature source Temporal, resp. rate 17, height '5\' 4"'$  (1.626 m), weight 180 lb 9.6 oz (81.9 kg), SpO2 100 %.     GI: Nontender, no apparent ascites, no mass, no hepatosplenomegaly Vascular: No leg edema    Portacath/PICC-without erythema  Lab Results:  Lab Results  Component Value Date   WBC 5.8 02/15/2020   HGB 9.9 (L) 02/15/2020   HCT 30.5 (L) 02/15/2020   MCV 117.3 (H) 02/15/2020   PLT 188 02/15/2020   NEUTROABS 3.4 02/15/2020    CMP  Lab Results  Component Value Date   NA 135 02/15/2020   K 4.3 02/15/2020   CL 104 02/15/2020   CO2 22 02/15/2020   GLUCOSE 104 (H) 02/15/2020   BUN 13 02/15/2020   CREATININE 1.07 (H) 02/15/2020   CALCIUM 9.1 02/15/2020   PROT 7.4 02/15/2020   ALBUMIN 3.7 02/15/2020   AST 20 02/15/2020   ALT 21 02/15/2020   ALKPHOS 75 02/15/2020   BILITOT 0.4 02/15/2020   GFRNONAA 55 (L) 02/15/2020   GFRAA >60 02/15/2020   Magnesium 1.8 Lab Results  Component Value Date   CEA1 <1.00 06/27/2017     Medications: I have reviewed the patient's current medications.   Assessment/Plan: 1. Left abdomen/pelvic pain ? CT abdomen/pelvis 06/26/2017-soft tissue implants in the lower anterior peritoneum with implants at the paracolic gutters and left pelvic sidewall ? NegativeMYRIADhereditary cancer panel ? Elevated CA 125 ? CT biopsy of anterior omental mass 07/04/2017-Metastatic carcinoma consistent with a gynecologic  primary ? 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 Cycle 1 adjuvant Taxol/carboplatin 08/05/2017 ? Cycle 2 adjuvant Taxol/carboplatin 08/26/2017 ? Cycle 3 adjuvant Taxol/carboplatin 09/17/2017-Taxol dose reduced secondary to neuropathy and bone pain ? Cycle 4 adjuvant Taxol/carboplatin 10/10/2017 ? Cycle 5 adjuvant Taxol/carboplatin 10/31/2017 ? Cycle 6 adjuvant Taxol/carboplatin 11/21/2017 ? CT abdomen/pelvis 08/08/2019-narrowing of rectosigmoid colon with eccentric serosal implant, 10 mm lymph node at the superior rectal vein, left upper quadrant soft tissue implant, 9 mm precaval node, 15 mm left internal iliac node, no ascites ? Cycle 1 Taxol/carboplatin 08/31/2019 ? Cycle 2 Taxol/carboplatin/Avastin 09/21/2019 ? Cycle 3 Taxol/Carboplatin 10/12/2019 (Avastin discontinued) ? Cycle 4 Taxol/carboplatin November 02, 2019 ? CT abdomen/pelvis 11/17/2019-decrease in size of previously prominent pericaval, left internal iliac, and superior rectal vein nodes, resolved rectosigmoid wall thickening ? Cycle 5 Taxol/carboplatin 11/22/2019 ? Cycle 6 Taxol/carboplatin 12/13/2019 ? Olaparib2/17/2021  2. Depression  3. Hypothyroid  4. Family history of uterine cancer-paternal grandmother  74.Acute onset dyspnea/pleuritic left-sided chest pain 10/02/2019  CT chest 10/04/2019-left lower lobe pulmonary emboli, multifocal pneumonia lower lobes  On Xarelto, status post course of Levaquin.   Disposition: Ms. Muzio returns as scheduled.  She continues olaparib.  She started magnesium  when she was here 2 weeks ago.  She  developed increased stool frequency, nausea, and fatigue after starting the magnesium.  The magnesium level is normal today.  She will discontinue magnesium.  She will contact us if her symptoms do not improve over the next few days.  Ms. Congleton will return for an office visit in 3 weeks.  Anna Coder, MD  02/15/2020  2:48 PM

## 2020-02-15 NOTE — Telephone Encounter (Signed)
Scheduled per los. Patient declined printout  

## 2020-02-16 LAB — CA 125: Cancer Antigen (CA) 125: 14.4 U/mL (ref 0.0–38.1)

## 2020-02-28 ENCOUNTER — Other Ambulatory Visit: Payer: Self-pay

## 2020-02-28 ENCOUNTER — Encounter: Payer: Self-pay | Admitting: Internal Medicine

## 2020-02-28 ENCOUNTER — Ambulatory Visit (INDEPENDENT_AMBULATORY_CARE_PROVIDER_SITE_OTHER): Payer: BC Managed Care – PPO | Admitting: Internal Medicine

## 2020-02-28 VITALS — BP 106/70 | HR 86 | Temp 96.9°F | Resp 16 | Ht 63.0 in | Wt 179.8 lb

## 2020-02-28 DIAGNOSIS — F329 Major depressive disorder, single episode, unspecified: Secondary | ICD-10-CM

## 2020-02-28 DIAGNOSIS — Z1231 Encounter for screening mammogram for malignant neoplasm of breast: Secondary | ICD-10-CM

## 2020-02-28 DIAGNOSIS — R5383 Other fatigue: Secondary | ICD-10-CM

## 2020-02-28 DIAGNOSIS — Z0000 Encounter for general adult medical examination without abnormal findings: Secondary | ICD-10-CM | POA: Diagnosis not present

## 2020-02-28 DIAGNOSIS — Z8371 Family history of colonic polyps: Secondary | ICD-10-CM

## 2020-02-28 DIAGNOSIS — K219 Gastro-esophageal reflux disease without esophagitis: Secondary | ICD-10-CM

## 2020-02-28 DIAGNOSIS — E7800 Pure hypercholesterolemia, unspecified: Secondary | ICD-10-CM

## 2020-02-28 DIAGNOSIS — F32A Depression, unspecified: Secondary | ICD-10-CM

## 2020-02-28 DIAGNOSIS — I10 Essential (primary) hypertension: Secondary | ICD-10-CM

## 2020-02-28 DIAGNOSIS — C5700 Malignant neoplasm of unspecified fallopian tube: Secondary | ICD-10-CM

## 2020-02-28 DIAGNOSIS — R0989 Other specified symptoms and signs involving the circulatory and respiratory systems: Secondary | ICD-10-CM

## 2020-02-28 MED ORDER — LISINOPRIL 10 MG PO TABS: 10.0000 mg | ORAL_TABLET | Freq: Every day | ORAL | 1 refills | Status: DC

## 2020-02-28 NOTE — Assessment & Plan Note (Addendum)
Physical today 02/28/20.  PAP 02/09/19 - negative with negative HPV.  Colonoscopy 12/2018.

## 2020-02-28 NOTE — Progress Notes (Signed)
Patient ID: Anna [name], female   DOB: Feb 19, 1956, 64 y.o.   MRN: [id number]   Subjective:    Patient ID: Anna [name], female    DOB: [birth year], 64 y.o.   MRN: [id number]  HPI This visit occurred during the SARS-CoV-2 public health emergency.  Safety protocols were in place, including screening questions prior to the visit, additional usage of staff PPE, and extensive cleaning of exam room while observing appropriate contact time as indicated for disinfecting solutions.  Patient here for her physical exam.  She reports some increased fatigue.  Nausea is better.  No chest pain or sob reported.  On wellbutrin.  On omeprazole.  Controls acid reflux.  No abdominal pain.  Bowels moving.  Taking crestor.     Past Medical History:  Diagnosis Date  . Anxiety   . Arthritis   . Depression   . Diverticulosis   . Fallopian tube carcinoma, left ([place]) 2018  . Family history of adverse reaction to anesthesia    Mother has extreme [place] with anesthesia  . Family history of breast cancer   . Frequent headaches    H/O  . Genetic testing 11/07/2017  . GERD (gastroesophageal reflux disease)   . Heart murmur    Hx of  . History of chicken pox   . Hypercholesterolemia   . Hypertension   . Hypothyroidism   . Pneumonia   . Sleep apnea    No longer wears cpap   Past Surgical History:  Procedure Laterality Date  . ABDOMINAL HYSTERECTOMY     Dr. Denman George 07/17/17  . APPENDECTOMY  1981  . CARPAL TUNNEL RELEASE  2000  . [place]  . EYE SURGERY Bilateral 2008   Lasix eye srg  . IR IMAGING GUIDED PORT INSERTION  08/26/2019  . LAPAROSCOPY N/A 07/17/2017   Procedure: LAPAROSCOPY DIAGNOSTIC;  Surgeon: Everitt Amber, MD;  Location: [facility];  Service: Gynecology;  Laterality: N/A;  . LAPAROTOMY N/A 07/17/2017   Procedure: EXPLORATORY LAPAROTOMY;  Surgeon: Everitt Amber, MD;  Location: [facility];  Service: Gynecology;  Laterality: N/A;  . OMENTECTOMY N/A 07/17/2017   Procedure:  OMENTECTOMY WITH RADICAL TUMOR DEBULKING;  Surgeon: Everitt Amber, MD;  Location: [facility];  Service: Gynecology;  Laterality: N/A;  . TUBAL LIGATION  1985   Family History  Problem Relation Age of Onset  . Hyperlipidemia Mother   . Hypertension Mother   . Alzheimer's disease Mother   . Arthritis Father   . Hyperlipidemia Father   . Heart disease Father        first MI age 52  . Hypertension Father   . Hyperlipidemia Brother   . Heart disease Brother        heart disease dx at a youg age  . Hypertension Brother   . Colon polyps Brother   . Alzheimer's disease Maternal Aunt   . Hyperlipidemia Maternal Uncle   . Heart disease Maternal Uncle   . Sudden death Maternal Uncle 42       massive heart attack in doctors office  . Arthritis Maternal Grandmother   . Hyperlipidemia Maternal Grandmother   . Stroke Maternal Grandmother   . Hypertension Maternal Grandmother   . Alzheimer's disease Maternal Grandmother   . Alcohol abuse Maternal Grandfather   . Hypertension Maternal Grandfather   . Diabetes Maternal Grandfather   . Cancer Paternal Grandmother        Ovarian  . Hypertension Paternal Grandmother   . Alcohol abuse  Paternal Grandfather   . Hyperlipidemia Paternal Grandfather   . Heart disease Paternal Grandfather   . Hypertension Paternal Grandfather   . Breast cancer Cousin        pat cousin  . Colon cancer Neg Hx   . Rectal cancer Neg Hx   . Stomach cancer Neg Hx    Social History   Socioeconomic History  . Marital status: Married    Spouse name: Not on file  . Number of children: 2  . Years of education: Not on file  . Highest education level: Not on file  Occupational History  . Not on file  Tobacco Use  . Smoking status: Never Smoker  . Smokeless tobacco: Never Used  Substance and Sexual Activity  . Alcohol use: No    Alcohol/week: 0.0 standard drinks  . Drug use: No  . Sexual activity: Yes  Other Topics Concern  . Not on file  Social History Narrative    Married to [place]   2 children   No alcohol tobacco or drug use   Social Determinants of Radio broadcast assistant Strain:   . Difficulty of Paying Living Expenses:   Food Insecurity:   . Worried About Charity fundraiser in the Last Year:   . Arboriculturist in the Last Year:   Transportation Needs:   . Film/video editor (Medical):   Marland Kitchen Lack of Transportation (Non-Medical):   Physical Activity:   . Days of Exercise per Week:   . Minutes of Exercise per Session:   Stress:   . Feeling of Stress :   Social Connections:   . Frequency of Communication with Friends and Family:   . Frequency of Social Gatherings with Friends and Family:   . Attends Religious Services:   . Active Member of Clubs or Organizations:   . Attends Archivist Meetings:   Marland Kitchen Marital Status:     Outpatient Encounter Medications as of 02/28/2020  Medication Sig  . acyclovir (ZOVIRAX) 400 MG tablet TAKE 1 TABLET THREE TIMES A DAY FOR 5 DAYS AS NEEDED FOR FLARES (Patient not taking: Reported on 02/01/2020)  . buPROPion (WELLBUTRIN XL) 300 MG 24 hr tablet Take 1 tablet (300 mg total) by mouth daily.  Marland Kitchen EPINEPHrine 0.3 mg/0.3 mL IJ SOAJ injection Inject 0.3 mLs (0.3 mg total) into the muscle as needed (for anaphylaxis). (Patient not taking: Reported on 02/01/2020)  . levothyroxine (EUTHYROX) 88 MCG tablet TAKE 1 TABLET BY MOUTH ONCE DAILY BEFORE BREAKFAST  . lidocaine-prilocaine (EMLA) cream Apply 1 application topically as directed. Apply 1 hour prior to port stick and cover with plastic wrap  . lisinopril (ZESTRIL) 10 MG tablet Take 1 tablet (10 mg total) by mouth daily.  Marland Kitchen LYNPARZA 150 MG tablet TAKE 2 TABLETS BY MOUTH TWO TIMES DAILY. SWALLOW WHOLE. MAY TAKE WITH FOOD TO DECREASE NAUSEA & VOMITING  . Multiple Vitamin (MULTIVITAMIN WITH MINERALS) TABS tablet Take 1 tablet by mouth daily.  Marland Kitchen omeprazole (PRILOSEC) 20 MG capsule TAKE 1 CAPSULE TWICE A DAY BEFORE MEALS (Patient taking differently: 20 mg  daily. TAKE 1 CAPSULE TWICE A DAY BEFORE MEALS)  . ondansetron (ZOFRAN) 8 MG tablet Take 1 tablet (8 mg total) by mouth every 8 (eight) hours as needed for nausea or vomiting. (Patient not taking: Reported on 02/01/2020)  . prochlorperazine (COMPAZINE) 10 MG tablet Take 1 tablet (10 mg total) by mouth every 6 (six) hours as needed. (Patient not taking: Reported on 02/01/2020)  .  rivaroxaban (XARELTO) 20 MG TABS tablet Take 1 tablet (20 mg total) by mouth daily with supper.  . rosuvastatin (CRESTOR) 5 MG tablet Take 1 tablet by mouth once daily  . [DISCONTINUED] lisinopril (ZESTRIL) 20 MG tablet Take 1 tablet (20 mg total) by mouth daily.   No facility-administered encounter medications on file as of 02/28/2020.   Review of Systems  Constitutional: Positive for fatigue. Negative for appetite change and unexpected weight change.  HENT: Negative for congestion and sinus pressure.   Eyes: Negative for pain and visual disturbance.  Respiratory: Negative for cough, chest tightness and shortness of breath.   Cardiovascular: Negative for chest pain, palpitations and leg swelling.  Gastrointestinal: Negative for abdominal pain, diarrhea and vomiting.       Nausea is better.   Genitourinary: Negative for difficulty urinating and dysuria.  Musculoskeletal: Negative for joint swelling and myalgias.  Skin: Negative for color change and rash.  Neurological: Negative for dizziness, light-headedness and headaches.  Hematological: Negative for adenopathy. Does not bruise/bleed easily.  Psychiatric/Behavioral: Negative for agitation and dysphoric mood.       Objective:    Physical Exam Vitals reviewed.  Constitutional:      General: She is not in acute distress.    Appearance: Normal appearance. She is well-developed.  HENT:     Head: Normocephalic and atraumatic.     Right Ear: External ear normal.     Left Ear: External ear normal.  Eyes:     General: No scleral icterus.       Right eye: No  discharge.        Left eye: No discharge.  Neck:     Thyroid: No thyromegaly.  Cardiovascular:     Rate and Rhythm: Normal rate and regular rhythm.  Pulmonary:     Effort: No tachypnea, accessory muscle usage or respiratory distress.     Breath sounds: Normal breath sounds. No decreased breath sounds or wheezing.  Chest:     Breasts:        Right: No inverted nipple, mass, nipple discharge or tenderness (no axillary adenopathy).        Left: No inverted nipple, mass, nipple discharge or tenderness (no axilarry adenopathy).  Abdominal:     General: Bowel sounds are normal.     Palpations: Abdomen is soft.     Tenderness: There is no abdominal tenderness.  Musculoskeletal:        General: No swelling or tenderness.     Cervical back: Neck supple. No tenderness.     Comments: Unable to palpate pulse right DP.    Lymphadenopathy:     Cervical: No cervical adenopathy.  Skin:    Findings: No erythema, lesion or rash.  Neurological:     Mental Status: She is alert and oriented to person, place, and time.  Psychiatric:        Mood and Affect: Mood normal.        Behavior: Behavior normal.     BP 106/70   Pulse 86   Temp (!) 96.9 F (36.1 C)   Resp 16   Ht 5\' 3"  (1.6 m)   Wt 179 lb 12.8 oz (81.6 kg)   SpO2 99%   BMI 31.85 kg/m  Wt Readings from Last 3 Encounters:  02/28/20 179 lb 12.8 oz (81.6 kg)  02/15/20 180 lb 9.6 oz (81.9 kg)  02/01/20 185 lb (83.9 kg)     Lab Results  Component Value Date   WBC 5.8 02/15/2020  HGB 9.9 (L) 02/15/2020   HCT 30.5 (L) 02/15/2020   PLT 188 02/15/2020   GLUCOSE 104 (H) 02/15/2020   CHOL 160 02/09/2019   TRIG 167.0 (H) 02/09/2019   HDL 45.30 02/09/2019   LDLDIRECT 143.0 05/23/2016   LDLCALC 82 02/09/2019   ALT 21 02/15/2020   AST 20 02/15/2020   NA 135 02/15/2020   K 4.3 02/15/2020   CL 104 02/15/2020   CREATININE 1.07 (H) 02/15/2020   BUN 13 02/15/2020   CO2 22 02/15/2020   TSH 2.91 06/23/2018   INR 0.9 08/26/2019     CT ANGIO CHEST PE W OR WO CONTRAST  Result Date: 01/12/2020 CLINICAL DATA:  History of PE, recent onset pleuritic right-sided chest pain, dyspnea, history of fallopian tube cancer EXAM: CT ANGIOGRAPHY CHEST WITH CONTRAST TECHNIQUE: Multidetector CT imaging of the chest was performed using the standard protocol during bolus administration of intravenous contrast. Multiplanar CT image reconstructions and MIPs were obtained to evaluate the vascular anatomy. CONTRAST:  174mL OMNIPAQUE IOHEXOL 350 MG/ML SOLN COMPARISON:  10/04/2019 FINDINGS: Cardiovascular: Right chest port catheter. Satisfactory opacification of the pulmonary arteries to the segmental level. There is redemonstrated pulmonary embolus, reduced in volume compared to prior examination dated 10/04/1999 20, most notable in the segmental to subsegmental branches of the left lower lobe (series 5, image 46) although likely also present in the subsegmental branches of the right lower lobe. No new pulmonary embolus identified. Normal heart size. No pericardial effusion. Mediastinum/Nodes: No enlarged mediastinal, hilar, or axillary lymph nodes. Thyroid gland, trachea, and esophagus demonstrate no significant findings. Lungs/Pleura: Bibasilar partial atelectasis and/or scarring, with mosaic attenuation of the airspaces in the lung bases (series 7, image 83). No pleural effusion or pneumothorax. Upper Abdomen: No acute abnormality. Hepatic steatosis. Musculoskeletal: No chest wall abnormality. No acute or significant osseous findings. Review of the MIP images confirms the above findings. IMPRESSION: 1. Interval decrease in volume of previously noted pulmonary embolus, most notable in the segmental to subsegmental branches of the left lower lobe. No new pulmonary embolus identified. 2. Bibasilar partial atelectasis and/or scarring, with mosaic attenuation of the airspaces in the lung bases, likely related to differential perfusion given the presence of  pulmonary embolus. 3. Hepatic steatosis. Electronically Signed   By: Eddie Candle M.D.   On: 01/12/2020 12:08       Assessment & Plan:   Problem List Items Addressed This Visit    Absent pulse    Unable to appreciate DP pulse right.  Refer to vascular surgery for ABIs.        Relevant Orders   Ambulatory referral to Vascular Surgery   Depression    On wellbutrin.  Overall stable.  Follow.       Essential hypertension, benign    Blood pressure running a little low.  Decrease lisinopril to 10mg  q day.  Follow pressures.  Follow metabolic panel.       Relevant Medications   lisinopril (ZESTRIL) 10 MG tablet   Family history of colonic polyps    Two 2-6 mm cecal polyps.  03/30/19.        Fatigue    Reports some fatigue.  Feels related to the treatment. Seeing Dr Benay Spice.        GERD (gastroesophageal reflux disease)    On omeprazole.  Controlled.       Health care maintenance    Physical today 02/28/20.  PAP 02/09/19 - negative with negative HPV.  Colonoscopy 12/2018.  Hypercholesterolemia    On crestor.  Low cholesterol diet and exercise.  Follow lipid panel and liver function tests.        Relevant Medications   lisinopril (ZESTRIL) 10 MG tablet   Malignant neoplasm of fallopian tube ([place])    Followed by oncology.  Note reviewed.         Other Visit Diagnoses    Routine general medical examination at a health care facility    -  Primary   Encounter for screening mammogram for malignant neoplasm of breast       Relevant Orders   MM 3D SCREEN BREAST BILATERAL       Einar Pheasant, MD

## 2020-02-28 NOTE — Patient Instructions (Signed)
Decrease lisinopril to 10mg  per day.

## 2020-03-04 ENCOUNTER — Encounter: Payer: Self-pay | Admitting: Internal Medicine

## 2020-03-04 DIAGNOSIS — R0989 Other specified symptoms and signs involving the circulatory and respiratory systems: Secondary | ICD-10-CM | POA: Insufficient documentation

## 2020-03-04 NOTE — Assessment & Plan Note (Signed)
Two 2-6 mm cecal polyps.  03/30/19.

## 2020-03-04 NOTE — Assessment & Plan Note (Signed)
Blood pressure running a little low.  Decrease lisinopril to 10mg  q day.  Follow pressures.  Follow metabolic panel.

## 2020-03-04 NOTE — Assessment & Plan Note (Signed)
On omeprazole.  Controlled.   

## 2020-03-04 NOTE — Assessment & Plan Note (Signed)
Followed by oncology.  Note reviewed.  

## 2020-03-04 NOTE — Assessment & Plan Note (Signed)
On crestor.  Low cholesterol diet and exercise.  Follow lipid panel and liver function tests.   

## 2020-03-04 NOTE — Assessment & Plan Note (Signed)
On wellbutrin.  Overall stable.  Follow.

## 2020-03-04 NOTE — Assessment & Plan Note (Addendum)
Reports some fatigue.  Feels related to the treatment. Seeing Dr Benay Spice.

## 2020-03-04 NOTE — Assessment & Plan Note (Signed)
Unable to appreciate DP pulse right.  Refer to vascular surgery for ABIs.

## 2020-03-06 ENCOUNTER — Other Ambulatory Visit: Payer: Self-pay | Admitting: Oncology

## 2020-03-06 ENCOUNTER — Telehealth: Payer: Self-pay | Admitting: Internal Medicine

## 2020-03-06 NOTE — Telephone Encounter (Signed)
-----   Message from Ladell Pier, MD sent at 03/05/2020  1:55 PM EDT ----- Regarding: RE: update Ok, thanks ----- Message ----- From: Einar Pheasant, MD Sent: 03/05/2020   8:30 AM EDT To: Ladell Pier, MD Subject: update                                         I saw [name] recently. On exam, I could not appreciate a DP pulse on the right.  Her foot was warm and she had no ulcerations or problems with skin lesions not healing.  I talked with her about obtaining ABIs.  She wanted me to make you aware.   Thank you.  Einar Pheasant

## 2020-03-07 ENCOUNTER — Telehealth: Payer: Self-pay | Admitting: Oncology

## 2020-03-07 ENCOUNTER — Inpatient Hospital Stay: Payer: BC Managed Care – PPO

## 2020-03-07 ENCOUNTER — Inpatient Hospital Stay: Payer: BC Managed Care – PPO | Attending: Oncology | Admitting: Oncology

## 2020-03-07 ENCOUNTER — Other Ambulatory Visit: Payer: Self-pay

## 2020-03-07 VITALS — BP 115/78 | HR 79 | Temp 97.8°F | Resp 17 | Ht 63.0 in | Wt 179.9 lb

## 2020-03-07 DIAGNOSIS — F329 Major depressive disorder, single episode, unspecified: Secondary | ICD-10-CM | POA: Insufficient documentation

## 2020-03-07 DIAGNOSIS — Z90722 Acquired absence of ovaries, bilateral: Secondary | ICD-10-CM | POA: Diagnosis not present

## 2020-03-07 DIAGNOSIS — Z79899 Other long term (current) drug therapy: Secondary | ICD-10-CM | POA: Insufficient documentation

## 2020-03-07 DIAGNOSIS — Z7901 Long term (current) use of anticoagulants: Secondary | ICD-10-CM | POA: Diagnosis not present

## 2020-03-07 DIAGNOSIS — C5700 Malignant neoplasm of unspecified fallopian tube: Secondary | ICD-10-CM | POA: Diagnosis not present

## 2020-03-07 DIAGNOSIS — Z9221 Personal history of antineoplastic chemotherapy: Secondary | ICD-10-CM | POA: Insufficient documentation

## 2020-03-07 DIAGNOSIS — E039 Hypothyroidism, unspecified: Secondary | ICD-10-CM | POA: Insufficient documentation

## 2020-03-07 DIAGNOSIS — Z90710 Acquired absence of both cervix and uterus: Secondary | ICD-10-CM | POA: Insufficient documentation

## 2020-03-07 DIAGNOSIS — Z86718 Personal history of other venous thrombosis and embolism: Secondary | ICD-10-CM | POA: Diagnosis not present

## 2020-03-07 DIAGNOSIS — C5702 Malignant neoplasm of left fallopian tube: Secondary | ICD-10-CM

## 2020-03-07 DIAGNOSIS — Z95828 Presence of other vascular implants and grafts: Secondary | ICD-10-CM

## 2020-03-07 LAB — CMP (CANCER CENTER ONLY)
ALT: 24 U/L (ref 0–44)
AST: 25 U/L (ref 15–41)
Albumin: 3.6 g/dL (ref 3.5–5.0)
Alkaline Phosphatase: 80 U/L (ref 38–126)
Anion gap: 10 (ref 5–15)
BUN: 11 mg/dL (ref 8–23)
CO2: 24 mmol/L (ref 22–32)
Calcium: 9.2 mg/dL (ref 8.9–10.3)
Chloride: 104 mmol/L (ref 98–111)
Creatinine: 0.89 mg/dL (ref 0.44–1.00)
GFR, Est AFR Am: >60 mL/min (ref >60)
GFR, Estimated: >60 mL/min (ref >60)
Glucose, Bld: 104 mg/dL — ABNORMAL HIGH (ref 70–99)
Potassium: 4.4 mmol/L (ref 3.5–5.1)
Sodium: 138 mmol/L (ref 135–145)
Total Bilirubin: 0.3 mg/dL (ref 0.3–1.2)
Total Protein: 7.3 g/dL (ref 6.5–8.1)

## 2020-03-07 LAB — CBC WITH DIFFERENTIAL (CANCER CENTER ONLY)
Abs Immature Granulocytes: 0.03 10*3/uL (ref 0.00–0.07)
Basophils Absolute: 0 10*3/uL (ref 0.0–0.1)
Basophils Relative: 0 %
Eosinophils Absolute: 0.1 10*3/uL (ref 0.0–0.5)
Eosinophils Relative: 2 %
HCT: 29 % — ABNORMAL LOW (ref 36.0–46.0)
Hemoglobin: 9.7 g/dL — ABNORMAL LOW (ref 12.0–15.0)
Immature Granulocytes: 1 %
Lymphocytes Relative: 34 %
Lymphs Abs: 1.7 10*3/uL (ref 0.7–4.0)
MCH: 38.2 pg — ABNORMAL HIGH (ref 26.0–34.0)
MCHC: 33.4 g/dL (ref 30.0–36.0)
MCV: 114.2 fL — ABNORMAL HIGH (ref 80.0–100.0)
Monocytes Absolute: 0.5 10*3/uL (ref 0.1–1.0)
Monocytes Relative: 9 %
Neutro Abs: 2.7 10*3/uL (ref 1.7–7.7)
Neutrophils Relative %: 54 %
Platelet Count: 151 10*3/uL (ref 150–400)
RBC: 2.54 MIL/uL — ABNORMAL LOW (ref 3.87–5.11)
RDW: 15.6 % — ABNORMAL HIGH (ref 11.5–15.5)
WBC Count: 5.1 10*3/uL (ref 4.0–10.5)
nRBC: 0 % (ref 0.0–0.2)

## 2020-03-07 LAB — MAGNESIUM: Magnesium: 1.8 mg/dL (ref 1.7–2.4)

## 2020-03-07 MED ORDER — HEPARIN SOD (PORK) LOCK FLUSH 100 UNIT/ML IV SOLN
500.0000 [IU] | Freq: Once | INTRAVENOUS | Status: AC | PRN
  Administered 2020-03-07: 500 [IU]
  Filled 2020-03-07: qty 5

## 2020-03-07 MED ORDER — SODIUM CHLORIDE 0.9% FLUSH
10.0000 mL | INTRAVENOUS | Status: DC | PRN
  Administered 2020-03-07: 10 mL
  Filled 2020-03-07: qty 10

## 2020-03-07 NOTE — Telephone Encounter (Signed)
Scheduled per los. Patient declined printout  

## 2020-03-07 NOTE — Progress Notes (Signed)
 [facility]   Diagnosis: Fallopian tube carcinoma  INTERVAL HISTORY:   Ms. Damiani returns as scheduled.  She continues olaparib.  She feels well.  No rash or diarrhea.  When she saw her primary provider for a physical the right dorsal pedis pulse could not be palpated.  She denies leg or foot pain.  No discoloration.  Ms. Springer had her second dose of the COVID-19 vaccine last week.  She had a sore arm, nausea, and malaise.  She developed lymph nodes in the right neck and left axilla.  These have improved.  Objective:  Vital signs in last 24 hours:  Blood pressure 115/78, pulse 79, temperature 97.8 F (36.6 C), temperature source Temporal, resp. rate 17, height 5' 3" (1.6 m), weight 179 lb 14.4 oz (81.6 kg), SpO2 100 %.    HEENT: Neck without mass Lymphatics: No cervical or supraclavicular nodes.  Soft mobile 2 cm left axillary node.  No right axillary node GI: No hepatosplenomegaly, no apparent ascites, no mass, nontender Vascular: No leg edema, the femoral pulses palpable bilaterally, the left DP pulse is palpable.  I cannot palpate the right DP pulse.  Good capillary refill of the toes.   Lab Results:  Lab Results  Component Value Date   WBC 5.1 03/07/2020   HGB 9.7 (L) 03/07/2020   HCT 29.0 (L) 03/07/2020   MCV 114.2 (H) 03/07/2020   PLT 151 03/07/2020   NEUTROABS 2.7 03/07/2020    CMP  Lab Results  Component Value Date   NA 138 03/07/2020   K 4.4 03/07/2020   CL 104 03/07/2020   CO2 24 03/07/2020   GLUCOSE 104 (H) 03/07/2020   BUN 11 03/07/2020   CREATININE 0.89 03/07/2020   CALCIUM 9.2 03/07/2020   PROT 7.3 03/07/2020   ALBUMIN 3.6 03/07/2020   AST 25 03/07/2020   ALT 24 03/07/2020   ALKPHOS 80 03/07/2020   BILITOT 0.3 03/07/2020   GFRNONAA >60 03/07/2020   GFRAA >60 03/07/2020    Lab Results  Component Value Date   CEA1 <1.00 06/27/2017     Medications: I have reviewed the patient's current  medications.   Assessment/Plan: 1. Left abdomen/pelvic pain ? CT abdomen/pelvis 06/26/2017-soft tissue implants in the lower anterior peritoneum with implants at the paracolic gutters and left pelvic sidewall ? NegativeMYRIADhereditary cancer panel ? Elevated CA 125 ? CT biopsy of anterior omental mass 07/04/2017-Metastatic carcinoma consistent with a gynecologic primary ? 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 Cycle 1 adjuvant Taxol/carboplatin 08/05/2017 ? Cycle 2 adjuvant Taxol/carboplatin 08/26/2017 ? Cycle 3 adjuvant Taxol/carboplatin 09/17/2017-Taxol dose reduced secondary to neuropathy and bone pain ? Cycle 4 adjuvant Taxol/carboplatin 10/10/2017 ? Cycle 5 adjuvant Taxol/carboplatin 10/31/2017 ? Cycle 6 adjuvant Taxol/carboplatin 11/21/2017 ? CT abdomen/pelvis 08/08/2019-narrowing of rectosigmoid colon with eccentric serosal implant, 10 mm lymph node at the superior rectal vein, left upper quadrant soft tissue implant, 9 mm precaval node, 15 mm left internal iliac node, no ascites ? Cycle 1 Taxol/carboplatin 08/31/2019 ? Cycle 2 Taxol/carboplatin/Avastin 09/21/2019 ? Cycle 3 Taxol/Carboplatin 10/12/2019 (Avastin discontinued) ? Cycle 4 Taxol/carboplatin November 02, 2019 ? CT abdomen/pelvis 11/17/2019-decrease in size of previously prominent pericaval, left internal iliac, and superior rectal   vein nodes, resolved rectosigmoid wall thickening ? Cycle 5 Taxol/carboplatin 11/22/2019 ? Cycle 6 Taxol/carboplatin 12/13/2019 ? Olaparib2/17/2021  2. Depression  3. Hypothyroid  4. Family history of uterine  cancer-paternal grandmother  5.Acute onset dyspnea/pleuritic left-sided chest pain 10/02/2019  CT chest 10/04/2019-left lower lobe pulmonary emboli, multifocal pneumonia lower lobes  On Xarelto, status post course of Levaquin.     Disposition: Ms. Men appears well.  She will continue Olaparib.  We will follow up on the CA-125 from today.  We will schedule a restaging CT evaluation if she develops a progressive rise in the CA-125.  Dr. Scott noted absence of the right DP pulse on exam.  Ms. Fiebig is maintained on anticoagulation therapy and has no symptoms in the right lower extremity.  She is comfortable not evaluating this further for now.  She continues treatment for advanced stage Fallopian tube carcinoma.  Ms. Leclaire will let us know if the left axillary lymphadenopathy does not resolve.  This is likely related to the COVID-19 vaccine.  Ms. Furber will return for an office and lab visit in 1 month.  Gary Sherrill, MD  03/07/2020  11:30 AM   

## 2020-03-08 ENCOUNTER — Encounter: Payer: Self-pay | Admitting: Internal Medicine

## 2020-03-08 LAB — CA 125: Cancer Antigen (CA) 125: 14.4 U/mL (ref 0.0–38.1)

## 2020-03-10 MED FILL — LYNPARZA 150 MG TABLET: 150 | 30 days supply | Qty: 120 | Fill #0

## 2020-03-23 ENCOUNTER — Telehealth: Payer: Self-pay | Admitting: Internal Medicine

## 2020-03-23 NOTE — Telephone Encounter (Signed)
Rejection Reason - Patient Declined" [facility] said about 1 hour ago

## 2020-03-27 ENCOUNTER — Other Ambulatory Visit: Payer: Self-pay | Admitting: Oncology

## 2020-04-04 ENCOUNTER — Inpatient Hospital Stay: Payer: BC Managed Care – PPO | Attending: Oncology | Admitting: Oncology

## 2020-04-04 ENCOUNTER — Telehealth: Payer: Self-pay | Admitting: Internal Medicine

## 2020-04-04 ENCOUNTER — Inpatient Hospital Stay: Payer: BC Managed Care – PPO

## 2020-04-04 ENCOUNTER — Other Ambulatory Visit: Payer: Self-pay

## 2020-04-04 VITALS — BP 117/82 | HR 84 | Temp 98.0°F | Resp 17 | Ht 63.0 in | Wt 177.7 lb

## 2020-04-04 DIAGNOSIS — Z86711 Personal history of pulmonary embolism: Secondary | ICD-10-CM | POA: Insufficient documentation

## 2020-04-04 DIAGNOSIS — C5700 Malignant neoplasm of unspecified fallopian tube: Secondary | ICD-10-CM | POA: Diagnosis not present

## 2020-04-04 DIAGNOSIS — Z9221 Personal history of antineoplastic chemotherapy: Secondary | ICD-10-CM | POA: Diagnosis not present

## 2020-04-04 DIAGNOSIS — Z7901 Long term (current) use of anticoagulants: Secondary | ICD-10-CM | POA: Insufficient documentation

## 2020-04-04 DIAGNOSIS — F329 Major depressive disorder, single episode, unspecified: Secondary | ICD-10-CM | POA: Insufficient documentation

## 2020-04-04 DIAGNOSIS — Z95828 Presence of other vascular implants and grafts: Secondary | ICD-10-CM

## 2020-04-04 DIAGNOSIS — E039 Hypothyroidism, unspecified: Secondary | ICD-10-CM | POA: Diagnosis not present

## 2020-04-04 DIAGNOSIS — C5702 Malignant neoplasm of left fallopian tube: Secondary | ICD-10-CM

## 2020-04-04 LAB — CBC WITH DIFFERENTIAL (CANCER CENTER ONLY)
Abs Immature Granulocytes: 0.06 10*3/uL (ref 0.00–0.07)
Basophils Absolute: 0 10*3/uL (ref 0.0–0.1)
Basophils Relative: 1 %
Eosinophils Absolute: 0.1 10*3/uL (ref 0.0–0.5)
Eosinophils Relative: 1 %
HCT: 29.5 % — ABNORMAL LOW (ref 36.0–46.0)
Hemoglobin: 10 g/dL — ABNORMAL LOW (ref 12.0–15.0)
Immature Granulocytes: 1 %
Lymphocytes Relative: 30 %
Lymphs Abs: 1.8 10*3/uL (ref 0.7–4.0)
MCH: 38.9 pg — ABNORMAL HIGH (ref 26.0–34.0)
MCHC: 33.9 g/dL (ref 30.0–36.0)
MCV: 114.8 fL — ABNORMAL HIGH (ref 80.0–100.0)
Monocytes Absolute: 0.6 10*3/uL (ref 0.1–1.0)
Monocytes Relative: 9 %
Neutro Abs: 3.6 10*3/uL (ref 1.7–7.7)
Neutrophils Relative %: 58 %
Platelet Count: 179 10*3/uL (ref 150–400)
RBC: 2.57 MIL/uL — ABNORMAL LOW (ref 3.87–5.11)
RDW: 15.4 % (ref 11.5–15.5)
WBC Count: 6.2 10*3/uL (ref 4.0–10.5)
nRBC: 0.3 % — ABNORMAL HIGH (ref 0.0–0.2)

## 2020-04-04 LAB — CMP (CANCER CENTER ONLY)
ALT: 19 U/L (ref 0–44)
AST: 18 U/L (ref 15–41)
Albumin: 3.6 g/dL (ref 3.5–5.0)
Alkaline Phosphatase: 82 U/L (ref 38–126)
Anion gap: 12 (ref 5–15)
BUN: 15 mg/dL (ref 8–23)
CO2: 24 mmol/L (ref 22–32)
Calcium: 9.2 mg/dL (ref 8.9–10.3)
Chloride: 105 mmol/L (ref 98–111)
Creatinine: 0.91 mg/dL (ref 0.44–1.00)
GFR, Est AFR Am: >60 mL/min (ref >60)
GFR, Estimated: >60 mL/min (ref >60)
Glucose, Bld: 103 mg/dL — ABNORMAL HIGH (ref 70–99)
Potassium: 3.9 mmol/L (ref 3.5–5.1)
Sodium: 141 mmol/L (ref 135–145)
Total Bilirubin: 0.2 mg/dL — ABNORMAL LOW (ref 0.3–1.2)
Total Protein: 7.2 g/dL (ref 6.5–8.1)

## 2020-04-04 LAB — MAGNESIUM: Magnesium: 1.6 mg/dL — ABNORMAL LOW (ref 1.7–2.4)

## 2020-04-04 MED ORDER — SODIUM CHLORIDE 0.9% FLUSH
10.0000 mL | INTRAVENOUS | Status: DC | PRN
  Administered 2020-04-04: 10 mL
  Filled 2020-04-04: qty 10

## 2020-04-04 MED ORDER — HEPARIN SOD (PORK) LOCK FLUSH 100 UNIT/ML IV SOLN
500.0000 [IU] | Freq: Once | INTRAVENOUS | Status: AC | PRN
  Administered 2020-04-04: 500 [IU]
  Filled 2020-04-04: qty 5

## 2020-04-04 NOTE — Progress Notes (Signed)
[facility] OFFICE PROGRESS NOTE   Diagnosis: Fallopian tube carcinoma  INTERVAL HISTORY:   Anna [name] returns as scheduled.  She continues [place]  She feels well.  No dyspnea, cough, or bleeding.  No leg swelling or pain.  She had an episode of substernal chest tightness with tightness in the bilateral neck on 04/01/2020.  This began in the afternoon and lasted until she went to bed.  The discomfort has not returned.  There were no associated symptoms.  Objective:  Vital signs in last 24 hours:  Blood pressure 117/82, pulse 84, temperature 98 F (36.7 C), temperature source Temporal, resp. rate 17, height _0  (1.6 m), weight 177 lb 11.2 oz (80.6 kg), SpO2 100 %.    HEENT: Neck without mass Resp: Lungs clear bilaterally Cardio: Regular rate and rhythm GI: No apparent ascites, no mass, nontender, no hepatosplenomegaly Vascular: No leg edema Musculoskeletal: No tenderness at the sternum  Portacath/PICC-without erythema  Lab Results:  Lab Results  Component Value Date   WBC 6.2 04/04/2020   HGB 10.0 (L) 04/04/2020   HCT 29.5 (L) 04/04/2020   MCV 114.8 (H) 04/04/2020   PLT 179 04/04/2020   NEUTROABS 3.6 04/04/2020    CMP  Lab Results  Component Value Date   NA 141 04/04/2020   K 3.9 04/04/2020   CL 105 04/04/2020   CO2 24 04/04/2020   GLUCOSE 103 (H) 04/04/2020   BUN 15 04/04/2020   CREATININE 0.91 04/04/2020   CALCIUM 9.2 04/04/2020   PROT 7.2 04/04/2020   ALBUMIN 3.6 04/04/2020   AST 18 04/04/2020   ALT 19 04/04/2020   ALKPHOS 82 04/04/2020   BILITOT 0.2 (L) 04/04/2020   GFRNONAA >60 04/04/2020   GFRAA >60 04/04/2020    Lab Results  Component Value Date   CEA1 <1.00 06/27/2017    Medications: I have reviewed the patient's current medications.   Assessment/Plan:  1. Left abdomen/pelvic pain ? CT abdomen/pelvis 06/26/2017-soft tissue implants in the lower anterior peritoneum with implants at the paracolic gutters and left  pelvic sidewall ? NegativeMYRIADhereditary cancer panel ? Elevated CA 125 ? CT biopsy of anterior omental mass 07/04/2017-Metastatic carcinoma consistent with a gynecologic primary ? 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 Cycle 1 adjuvant Taxol/carboplatin 08/05/2017 ? Cycle 2 adjuvant Taxol/carboplatin 08/26/2017 ? Cycle 3 adjuvant Taxol/carboplatin 09/17/2017-Taxol dose reduced secondary to neuropathy and bone pain ? Cycle 4 adjuvant Taxol/carboplatin 10/10/2017 ? Cycle 5 adjuvant Taxol/carboplatin 10/31/2017 ? Cycle 6 adjuvant Taxol/carboplatin 11/21/2017 ? CT abdomen/pelvis 08/08/2019-narrowing of rectosigmoid colon with eccentric serosal implant, 10 mm lymph node at the superior rectal vein, left upper quadrant soft tissue implant, 9 mm precaval node, 15 mm left internal iliac node, no ascites ? Cycle 1 Taxol/carboplatin 08/31/2019 ? Cycle 2 Taxol/carboplatin/Avastin 09/21/2019 ? Cycle 3 Taxol/Carboplatin 10/12/2019 (Avastin discontinued) ? Cycle 4 Taxol/carboplatin November 02, 2019 ? CT abdomen/pelvis 11/17/2019-decrease in size of previously prominent pericaval, left internal iliac, and superior rectal vein nodes, resolved rectosigmoid wall thickening ? Cycle 5 Taxol/carboplatin 11/22/2019 ? Cycle 6 Taxol/carboplatin 12/13/2019 ? Olaparib2/17/2021  2. Depression  3. Hypothyroid  4. Family history of uterine cancer-paternal grandmother  60.Acute onset dyspnea/pleuritic left-sided chest pain 10/02/2019  CT chest 10/04/2019-left lower  lobe pulmonary emboli, multifocal pneumonia lower lobes  On  Xarelto, status post course of Levaquin.   Disposition: [name] appears stable.  She is tolerating the olaparib well and there is no clinical evidence of disease progression.  We will follow-up on the CA-125 from today.  The etiology of the chest discomfort last weekend is unclear.  She will call for recurrent chest pain.  [name] will return for an office and lab visit on 05/09/2020.  Anna Coder, MD  04/04/2020  11:38 AM

## 2020-04-04 NOTE — Telephone Encounter (Signed)
LMTCB

## 2020-04-04 NOTE — Telephone Encounter (Signed)
Dr Benay Spice (pts oncologist) sent me a copy of his office note.  Ms Reynnolds apparently had episode of chest tightness recently.  Please confirm doing ok.  Also, see if she want to come in for evaluation.

## 2020-04-04 NOTE — Telephone Encounter (Signed)
Pt called back returning your call °

## 2020-04-05 ENCOUNTER — Telehealth: Payer: Self-pay

## 2020-04-05 ENCOUNTER — Encounter: Payer: Self-pay | Admitting: Oncology

## 2020-04-05 LAB — CA 125: Cancer Antigen (CA) 125: 20.5 U/mL (ref 0.0–38.1)

## 2020-04-05 NOTE — Telephone Encounter (Signed)
Pt called to let me know that she has not had anymore episodes and told oncology that she would be evaluated if another occurs. Does not feel she needs to be seen at this time.

## 2020-04-05 NOTE — Telephone Encounter (Signed)
Returned TC to pt and let her know that her CA125 was 20.5 yesterday and four weeks ago it was 14.4. I also let her know that the normal range for this is 0.0-38.1) so she is still in normal range. Patient verbalized understanding. No further problems or concerns at this time.

## 2020-04-05 NOTE — Telephone Encounter (Signed)
TC to pt per Dr Benay Spice to let her know that herCA-125 slightly higher over the past month, but remains in normal range, continue olaparib. Patient verbalized understanding.

## 2020-04-10 MED FILL — LYNPARZA 150 MG TABLET: 150 | 30 days supply | Qty: 120 | Fill #0

## 2020-04-10 MED FILL — XARELTO 20 MG TABLET: 20 | 90 days supply | Qty: 90 | Fill #0

## 2020-04-11 DIAGNOSIS — H3589 Other specified retinal disorders: Secondary | ICD-10-CM | POA: Diagnosis not present

## 2020-04-11 DIAGNOSIS — H2512 Age-related nuclear cataract, left eye: Secondary | ICD-10-CM | POA: Diagnosis not present

## 2020-04-11 DIAGNOSIS — Z961 Presence of intraocular lens: Secondary | ICD-10-CM | POA: Diagnosis not present

## 2020-04-24 ENCOUNTER — Ambulatory Visit: Payer: BC Managed Care – PPO | Admitting: Internal Medicine

## 2020-05-04 DIAGNOSIS — H26491 Other secondary cataract, right eye: Secondary | ICD-10-CM | POA: Diagnosis not present

## 2020-05-09 ENCOUNTER — Inpatient Hospital Stay: Payer: BC Managed Care – PPO | Attending: Oncology | Admitting: Oncology

## 2020-05-09 ENCOUNTER — Other Ambulatory Visit: Payer: Self-pay | Admitting: Internal Medicine

## 2020-05-09 ENCOUNTER — Other Ambulatory Visit: Payer: Self-pay

## 2020-05-09 ENCOUNTER — Inpatient Hospital Stay: Payer: BC Managed Care – PPO

## 2020-05-09 VITALS — BP 103/84 | HR 91 | Temp 97.3°F | Resp 17 | Ht 63.0 in | Wt 175.2 lb

## 2020-05-09 DIAGNOSIS — C5700 Malignant neoplasm of unspecified fallopian tube: Secondary | ICD-10-CM | POA: Diagnosis not present

## 2020-05-09 DIAGNOSIS — Z7901 Long term (current) use of anticoagulants: Secondary | ICD-10-CM | POA: Insufficient documentation

## 2020-05-09 DIAGNOSIS — Z86711 Personal history of pulmonary embolism: Secondary | ICD-10-CM | POA: Diagnosis not present

## 2020-05-09 DIAGNOSIS — E039 Hypothyroidism, unspecified: Secondary | ICD-10-CM | POA: Insufficient documentation

## 2020-05-09 DIAGNOSIS — C5702 Malignant neoplasm of left fallopian tube: Secondary | ICD-10-CM

## 2020-05-09 DIAGNOSIS — F329 Major depressive disorder, single episode, unspecified: Secondary | ICD-10-CM | POA: Diagnosis not present

## 2020-05-09 DIAGNOSIS — D5700 Hb-SS disease with crisis, unspecified: Secondary | ICD-10-CM | POA: Insufficient documentation

## 2020-05-09 DIAGNOSIS — Z95828 Presence of other vascular implants and grafts: Secondary | ICD-10-CM

## 2020-05-09 LAB — CMP (CANCER CENTER ONLY)
ALT: 23 U/L (ref 0–44)
AST: 20 U/L (ref 15–41)
Albumin: 3.8 g/dL (ref 3.5–5.0)
Alkaline Phosphatase: 79 U/L (ref 38–126)
Anion gap: 9 (ref 5–15)
BUN: 15 mg/dL (ref 8–23)
CO2: 24 mmol/L (ref 22–32)
Calcium: 9.4 mg/dL (ref 8.9–10.3)
Chloride: 104 mmol/L (ref 98–111)
Creatinine: 1.08 mg/dL — ABNORMAL HIGH (ref 0.44–1.00)
GFR, Est AFR Am: >60 mL/min (ref >60)
GFR, Estimated: 55 mL/min — ABNORMAL LOW (ref >60)
Glucose, Bld: 97 mg/dL (ref 70–99)
Potassium: 4.3 mmol/L (ref 3.5–5.1)
Sodium: 137 mmol/L (ref 135–145)
Total Bilirubin: 0.5 mg/dL (ref 0.3–1.2)
Total Protein: 7.4 g/dL (ref 6.5–8.1)

## 2020-05-09 LAB — CBC WITH DIFFERENTIAL (CANCER CENTER ONLY)
Abs Immature Granulocytes: 0.03 10*3/uL (ref 0.00–0.07)
Basophils Absolute: 0 10*3/uL (ref 0.0–0.1)
Basophils Relative: 0 %
Eosinophils Absolute: 0.1 10*3/uL (ref 0.0–0.5)
Eosinophils Relative: 2 %
HCT: 31.7 % — ABNORMAL LOW (ref 36.0–46.0)
Hemoglobin: 10.6 g/dL — ABNORMAL LOW (ref 12.0–15.0)
Immature Granulocytes: 1 %
Lymphocytes Relative: 26 %
Lymphs Abs: 1.7 10*3/uL (ref 0.7–4.0)
MCH: 38.3 pg — ABNORMAL HIGH (ref 26.0–34.0)
MCHC: 33.4 g/dL (ref 30.0–36.0)
MCV: 114.4 fL — ABNORMAL HIGH (ref 80.0–100.0)
Monocytes Absolute: 0.6 10*3/uL (ref 0.1–1.0)
Monocytes Relative: 9 %
Neutro Abs: 4.2 10*3/uL (ref 1.7–7.7)
Neutrophils Relative %: 62 %
Platelet Count: 192 10*3/uL (ref 150–400)
RBC: 2.77 MIL/uL — ABNORMAL LOW (ref 3.87–5.11)
RDW: 15.1 % (ref 11.5–15.5)
WBC Count: 6.6 10*3/uL (ref 4.0–10.5)
nRBC: 0.5 % — ABNORMAL HIGH (ref 0.0–0.2)

## 2020-05-09 LAB — MAGNESIUM: Magnesium: 1.7 mg/dL (ref 1.7–2.4)

## 2020-05-09 MED ORDER — HEPARIN SOD (PORK) LOCK FLUSH 100 UNIT/ML IV SOLN
500.0000 [IU] | Freq: Once | INTRAVENOUS | Status: AC | PRN
  Administered 2020-05-09: 500 [IU]
  Filled 2020-05-09: qty 5

## 2020-05-09 MED ORDER — OLAPARIB 150 MG PO TABS: ORAL_TABLET | ORAL | 0 refills | Status: DC

## 2020-05-09 MED ORDER — SODIUM CHLORIDE 0.9% FLUSH
10.0000 mL | INTRAVENOUS | Status: DC | PRN
  Administered 2020-05-09: 10 mL
  Filled 2020-05-09: qty 10

## 2020-05-09 NOTE — Progress Notes (Signed)
[place] OFFICE PROGRESS NOTE   Diagnosis: Fallopian tube carcinoma  INTERVAL HISTORY:   [name] returns as scheduled.  She continues olaparib.  No abdominal pain or nausea.  No difficulty with bowel function.  She reports a diminished appetite.  No bleeding or symptom of thrombosis.  Objective:  Vital signs in last 24 hours:  Blood pressure 103/84, pulse 91, temperature (!) 97.3 F (36.3 C), temperature source Temporal, resp. rate 17, height _0  (1.6 m), weight 175 lb 3.2 oz (79.5 kg), SpO2 97 %.    HEENT: No thrush or ulcers Resp: Lungs clear bilaterally Cardio: Regular rate and rhythm GI: Nontender, no hepatosplenomegaly, no mass, no apparent ascites Vascular: No leg edema  Portacath/PICC-without erythema  Lab Results:  Lab Results  Component Value Date   WBC 6.6 05/09/2020   HGB 10.6 (L) 05/09/2020   HCT 31.7 (L) 05/09/2020   MCV 114.4 (H) 05/09/2020   PLT 192 05/09/2020   NEUTROABS 4.2 05/09/2020    CMP  Lab Results  Component Value Date   NA 137 05/09/2020   K 4.3 05/09/2020   CL 104 05/09/2020   CO2 24 05/09/2020   GLUCOSE 97 05/09/2020   BUN 15 05/09/2020   CREATININE 1.08 (H) 05/09/2020   CALCIUM 9.4 05/09/2020   PROT 7.4 05/09/2020   ALBUMIN 3.8 05/09/2020   AST 20 05/09/2020   ALT 23 05/09/2020   ALKPHOS 79 05/09/2020   BILITOT 0.5 05/09/2020   GFRNONAA 55 (L) 05/09/2020   GFRAA >60 05/09/2020    Lab Results  Component Value Date   CEA1 <1.00 06/27/2017     Medications: I have reviewed the patient's current medications.   Assessment/Plan: 1. Left abdomen/pelvic pain ? CT abdomen/pelvis 06/26/2017-soft tissue implants in the lower anterior peritoneum with implants at the paracolic gutters and left pelvic sidewall ? NegativeMYRIADhereditary cancer panel ? Elevated CA 125 ? CT biopsy of anterior omental mass 07/04/2017-Metastatic carcinoma consistent with a gynecologic primary ? 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 Cycle 1 adjuvant Taxol/carboplatin 08/05/2017 ? Cycle 2 adjuvant Taxol/carboplatin 08/26/2017 ? Cycle 3 adjuvant Taxol/carboplatin 09/17/2017-Taxol dose reduced secondary to neuropathy and bone pain ? Cycle 4 adjuvant Taxol/carboplatin 10/10/2017 ? Cycle 5 adjuvant Taxol/carboplatin 10/31/2017 ? Cycle 6 adjuvant Taxol/carboplatin 11/21/2017 ? CT abdomen/pelvis 08/08/2019-narrowing of rectosigmoid colon with eccentric serosal implant, 10 mm lymph node at the superior rectal vein, left upper quadrant soft tissue implant, 9 mm precaval node, 15 mm left internal iliac node, no ascites ? Cycle 1 Taxol/carboplatin 08/31/2019 ? Cycle 2 Taxol/carboplatin/Avastin 09/21/2019 ? Cycle 3 Taxol/Carboplatin 10/12/2019 (Avastin discontinued) ? Cycle 4 Taxol/carboplatin November 02, 2019 ? CT abdomen/pelvis 11/17/2019-decrease in size of previously prominent pericaval, left internal iliac, and superior rectal vein nodes, resolved rectosigmoid wall thickening ? Cycle 5 Taxol/carboplatin 11/22/2019 ? Cycle 6 Taxol/carboplatin 12/13/2019 ? Olaparib2/17/2021  2. Depression  3. Hypothyroid  4. Family history of uterine cancer-paternal grandmother  41.Acute onset dyspnea/pleuritic left-sided chest pain 10/02/2019  CT chest 10/04/2019-left lower lobe pulmonary emboli, multifocal pneumonia lower lobes  On Xarelto, status post course of Levaquin.     Disposition: Ms. [name] appears unchanged.  She is tolerating the olaparib well.  The CA-125 was slightly higher  in the normal range last month.  We will follow up on the  CA-125 from today.  She will continue olaparib.  Ms. [name] will return for an office visit in approximately 5 weeks.  [name], MD  05/09/2020  10:35 AM

## 2020-05-10 ENCOUNTER — Encounter: Payer: Self-pay | Admitting: Internal Medicine

## 2020-05-10 ENCOUNTER — Ambulatory Visit
Admission: RE | Admit: 2020-05-10 | Discharge: 2020-05-10 | Disposition: A | Discharge status: Home / Self Care | Payer: BC Managed Care – PPO | Source: Ambulatory Visit | Attending: Internal Medicine | Admitting: Internal Medicine

## 2020-05-10 DIAGNOSIS — Z1231 Encounter for screening mammogram for malignant neoplasm of breast: Secondary | ICD-10-CM | POA: Diagnosis not present

## 2020-05-10 LAB — CA 125: Cancer Antigen (CA) 125: 33.3 U/mL (ref 0.0–38.1)

## 2020-05-11 ENCOUNTER — Other Ambulatory Visit: Payer: Self-pay

## 2020-05-11 MED ORDER — EPINEPHRINE 0.3 MG/0.3ML IJ SOAJ: 0.3000 mg | INTRAMUSCULAR | 0 refills | Status: DC | PRN

## 2020-05-11 NOTE — Telephone Encounter (Signed)
Pt called again about her EPINEPHrine 0.3 mg/0.3 mL IJ SOAJ injection

## 2020-05-12 ENCOUNTER — Telehealth: Payer: Self-pay | Admitting: *Deleted

## 2020-05-12 MED FILL — LYNPARZA 150 MG TABLET: 150 | 30 days supply | Qty: 120 | Fill #0

## 2020-05-12 NOTE — Telephone Encounter (Signed)
-----   Message from Ladell Pier, MD sent at 05/10/2020  4:14 PM EDT ----- Please call patient, CA-125 is higher, but still in normal range.  We will refer her for a restaging CT if higher at the next visit

## 2020-05-12 NOTE — Telephone Encounter (Signed)
Sent over rx for epipen prescription. Spoke with [facility] and confirmed that it is covered and ready for pick up with no charge. Left detailed message for patient letting her know.

## 2020-05-12 NOTE — Telephone Encounter (Signed)
Notified of CA125 result and MD plan to f/u and she agrees.

## 2020-05-16 ENCOUNTER — Ambulatory Visit: Payer: BC Managed Care – PPO | Admitting: Internal Medicine

## 2020-06-02 DIAGNOSIS — Z20822 Contact with and (suspected) exposure to covid-19: Secondary | ICD-10-CM | POA: Diagnosis not present

## 2020-06-07 ENCOUNTER — Other Ambulatory Visit: Payer: Self-pay | Admitting: Oncology

## 2020-06-13 ENCOUNTER — Inpatient Hospital Stay: Payer: BC Managed Care – PPO

## 2020-06-13 ENCOUNTER — Telehealth: Payer: Self-pay | Admitting: Oncology

## 2020-06-13 ENCOUNTER — Inpatient Hospital Stay: Payer: BC Managed Care – PPO | Attending: Oncology | Admitting: Oncology

## 2020-06-13 ENCOUNTER — Other Ambulatory Visit: Payer: Self-pay

## 2020-06-13 VITALS — BP 110/69 | HR 85 | Temp 97.6°F | Resp 17 | Ht 63.0 in | Wt 174.9 lb

## 2020-06-13 DIAGNOSIS — Z9221 Personal history of antineoplastic chemotherapy: Secondary | ICD-10-CM | POA: Insufficient documentation

## 2020-06-13 DIAGNOSIS — R918 Other nonspecific abnormal finding of lung field: Secondary | ICD-10-CM | POA: Diagnosis not present

## 2020-06-13 DIAGNOSIS — E039 Hypothyroidism, unspecified: Secondary | ICD-10-CM | POA: Diagnosis not present

## 2020-06-13 DIAGNOSIS — C5700 Malignant neoplasm of unspecified fallopian tube: Secondary | ICD-10-CM | POA: Insufficient documentation

## 2020-06-13 DIAGNOSIS — F329 Major depressive disorder, single episode, unspecified: Secondary | ICD-10-CM | POA: Insufficient documentation

## 2020-06-13 DIAGNOSIS — C5702 Malignant neoplasm of left fallopian tube: Secondary | ICD-10-CM

## 2020-06-13 DIAGNOSIS — Z95828 Presence of other vascular implants and grafts: Secondary | ICD-10-CM

## 2020-06-13 DIAGNOSIS — R0602 Shortness of breath: Secondary | ICD-10-CM | POA: Insufficient documentation

## 2020-06-13 LAB — CMP (CANCER CENTER ONLY)
ALT: 16 U/L (ref 0–44)
AST: 17 U/L (ref 15–41)
Albumin: 3.4 g/dL — ABNORMAL LOW (ref 3.5–5.0)
Alkaline Phosphatase: 70 U/L (ref 38–126)
Anion gap: 10 (ref 5–15)
BUN: 13 mg/dL (ref 8–23)
CO2: 24 mmol/L (ref 22–32)
Calcium: 9.5 mg/dL (ref 8.9–10.3)
Chloride: 105 mmol/L (ref 98–111)
Creatinine: 0.99 mg/dL (ref 0.44–1.00)
GFR, Est AFR Am: >60 mL/min (ref >60)
GFR, Estimated: >60 mL/min (ref >60)
Glucose, Bld: 93 mg/dL (ref 70–99)
Potassium: 4.2 mmol/L (ref 3.5–5.1)
Sodium: 139 mmol/L (ref 135–145)
Total Bilirubin: 0.6 mg/dL (ref 0.3–1.2)
Total Protein: 6.8 g/dL (ref 6.5–8.1)

## 2020-06-13 LAB — CBC WITH DIFFERENTIAL (CANCER CENTER ONLY)
Abs Immature Granulocytes: 0.03 10*3/uL (ref 0.00–0.07)
Basophils Absolute: 0 10*3/uL (ref 0.0–0.1)
Basophils Relative: 0 %
Eosinophils Absolute: 0.1 10*3/uL (ref 0.0–0.5)
Eosinophils Relative: 1 %
HCT: 28.5 % — ABNORMAL LOW (ref 36.0–46.0)
Hemoglobin: 9.5 g/dL — ABNORMAL LOW (ref 12.0–15.0)
Immature Granulocytes: 1 %
Lymphocytes Relative: 28 %
Lymphs Abs: 1.6 10*3/uL (ref 0.7–4.0)
MCH: 38.2 pg — ABNORMAL HIGH (ref 26.0–34.0)
MCHC: 33.3 g/dL (ref 30.0–36.0)
MCV: 114.5 fL — ABNORMAL HIGH (ref 80.0–100.0)
Monocytes Absolute: 0.6 10*3/uL (ref 0.1–1.0)
Monocytes Relative: 10 %
Neutro Abs: 3.5 10*3/uL (ref 1.7–7.7)
Neutrophils Relative %: 60 %
Platelet Count: 183 10*3/uL (ref 150–400)
RBC: 2.49 MIL/uL — ABNORMAL LOW (ref 3.87–5.11)
RDW: 15.7 % — ABNORMAL HIGH (ref 11.5–15.5)
WBC Count: 5.7 10*3/uL (ref 4.0–10.5)
nRBC: 0 % (ref 0.0–0.2)

## 2020-06-13 MED ORDER — HEPARIN SOD (PORK) LOCK FLUSH 100 UNIT/ML IV SOLN
500.0000 [IU] | Freq: Once | INTRAVENOUS | Status: AC | PRN
  Administered 2020-06-13: 500 [IU]
  Filled 2020-06-13: qty 5

## 2020-06-13 MED ORDER — SODIUM CHLORIDE 0.9% FLUSH
10.0000 mL | INTRAVENOUS | Status: DC | PRN
  Administered 2020-06-13: 10 mL
  Filled 2020-06-13: qty 10

## 2020-06-13 MED FILL — LYNPARZA 150 MG TABLET: 150 | 30 days supply | Qty: 120 | Fill #0

## 2020-06-13 NOTE — Telephone Encounter (Signed)
Scheduled per 07/27 los, patient will be notified per My chart.

## 2020-06-13 NOTE — Progress Notes (Signed)
[place] OFFICE PROGRESS NOTE   Diagnosis: Fallopian tube carcinoma  INTERVAL HISTORY:   Anna [name] returns for a scheduled visit.  She continues olaparib.  She had an acute illness beginning 05/31/2020 characterized by fever, nausea/vomiting, and diarrhea.  She reports having a negative COVID-19 test.  The symptoms resolved over 5-6 days.  She now feels well.  She has mild discomfort in the left lower abdomen.  No bleeding.  Objective:  Vital signs in last 24 hours:  Blood pressure 110/69, pulse 85, temperature 97.6 F (36.4 C), temperature source Temporal, resp. rate 17, height 5' 3"  (1.6 m), weight 174 lb 14.4 oz (79.3 kg), SpO2 100 %.    Resp: Lungs clear bilaterally Cardio: Regular rate and rhythm GI: No hepatosplenomegaly, no mass, nontender, no apparent ascites Vascular: No leg edema    Portacath/PICC-without erythema  Lab Results:  Lab Results  Component Value Date   WBC 5.7 06/13/2020   HGB 9.5 (L) 06/13/2020   HCT 28.5 (L) 06/13/2020   MCV 114.5 (H) 06/13/2020   PLT 183 06/13/2020   NEUTROABS 3.5 06/13/2020    CMP  Lab Results  Component Value Date   NA 137 05/09/2020   K 4.3 05/09/2020   CL 104 05/09/2020   CO2 24 05/09/2020   GLUCOSE 97 05/09/2020   BUN 15 05/09/2020   CREATININE 1.08 (H) 05/09/2020   CALCIUM 9.4 05/09/2020   PROT 7.4 05/09/2020   ALBUMIN 3.8 05/09/2020   AST 20 05/09/2020   ALT 23 05/09/2020   ALKPHOS 79 05/09/2020   BILITOT 0.5 05/09/2020   GFRNONAA 55 (L) 05/09/2020   GFRAA >60 05/09/2020     Medications: I have reviewed the patient's current medications.   Assessment/Plan: 1. Left abdomen/pelvic pain ? CT abdomen/pelvis 06/26/2017-soft tissue implants in the lower anterior peritoneum with implants at the paracolic gutters and left pelvic sidewall ? NegativeMYRIADhereditary cancer panel ? Elevated CA 125 ? CT biopsy of anterior omental mass 07/04/2017-Metastatic carcinoma consistent with a  gynecologic primary ? 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 Cycle 1 adjuvant Taxol/carboplatin 08/05/2017 ? Cycle 2 adjuvant Taxol/carboplatin 08/26/2017 ? Cycle 3 adjuvant Taxol/carboplatin 09/17/2017-Taxol dose reduced secondary to neuropathy and bone pain ? Cycle 4 adjuvant Taxol/carboplatin 10/10/2017 ? Cycle 5 adjuvant Taxol/carboplatin 10/31/2017 ? Cycle 6 adjuvant Taxol/carboplatin 11/21/2017 ? CT abdomen/pelvis 08/08/2019-narrowing of rectosigmoid colon with eccentric serosal implant, 10 mm lymph node at the superior rectal vein, left upper quadrant soft tissue implant, 9 mm precaval node, 15 mm left internal iliac node, no ascites ? Cycle 1 Taxol/carboplatin 08/31/2019 ? Cycle 2 Taxol/carboplatin/Avastin 09/21/2019 ? Cycle 3 Taxol/Carboplatin 10/12/2019 (Avastin discontinued) ? Cycle 4 Taxol/carboplatin November 02, 2019 ? CT abdomen/pelvis 11/17/2019-decrease in size of previously prominent pericaval, left internal iliac, and superior rectal vein nodes, resolved rectosigmoid wall thickening ? Cycle 5 Taxol/carboplatin 11/22/2019 ? Cycle 6 Taxol/carboplatin 12/13/2019 ? Olaparib2/17/2021  2. Depression  3. Hypothyroid  4. Family history of uterine cancer-paternal grandmother  52.Acute onset dyspnea/pleuritic left-sided chest pain 10/02/2019  CT chest 10/04/2019-left lower lobe pulmonary emboli, multifocal pneumonia lower lobes  On Xarelto, status post course of Levaquin.   Disposition: Anna [name] appears stable.  She is tolerating the olaparib well.  The  CA-125 has been higher within the normal  range over the past few months.  We will follow up on the CA-125 from today.  The plan is to schedule a restaging CT abdomen/pelvis if the CA-125 is higher.  She will return for an office visit in 1 month.  She had an acute gastrointestinal illness several weeks ago.  This was likely a viral infection.  I doubt her symptoms are related to a lap rib or the fallopian tube carcinoma.    Anna Coder, MD  06/13/2020  10:05 AM

## 2020-06-14 ENCOUNTER — Telehealth: Payer: Self-pay | Admitting: *Deleted

## 2020-06-14 LAB — CA 125: Cancer Antigen (CA) 125: 39.4 U/mL — ABNORMAL HIGH (ref 0.0–38.1)

## 2020-06-14 NOTE — Telephone Encounter (Signed)
-----   Message from Ladell Pier, MD sent at 06/14/2020 11:48 AM EDT ----- Please call patient, CA-125 is slightly higher, continue olaparib, proceed with CT as scheduled

## 2020-06-14 NOTE — Telephone Encounter (Signed)
Notified of increase in CA125. Continue the olaparib and will proceed w/CT scan as scheduled. She understands and agrees.

## 2020-06-19 DIAGNOSIS — Z7901 Long term (current) use of anticoagulants: Secondary | ICD-10-CM | POA: Diagnosis not present

## 2020-06-19 DIAGNOSIS — E039 Hypothyroidism, unspecified: Secondary | ICD-10-CM | POA: Diagnosis not present

## 2020-06-19 DIAGNOSIS — Z79890 Hormone replacement therapy (postmenopausal): Secondary | ICD-10-CM | POA: Diagnosis not present

## 2020-06-19 DIAGNOSIS — I10 Essential (primary) hypertension: Secondary | ICD-10-CM | POA: Diagnosis not present

## 2020-06-19 DIAGNOSIS — Z6830 Body mass index (BMI) 30.0-30.9, adult: Secondary | ICD-10-CM | POA: Diagnosis not present

## 2020-06-19 DIAGNOSIS — G4730 Sleep apnea, unspecified: Secondary | ICD-10-CM | POA: Diagnosis not present

## 2020-06-19 DIAGNOSIS — K219 Gastro-esophageal reflux disease without esophagitis: Secondary | ICD-10-CM | POA: Diagnosis not present

## 2020-06-19 DIAGNOSIS — Z79899 Other long term (current) drug therapy: Secondary | ICD-10-CM | POA: Diagnosis not present

## 2020-06-19 DIAGNOSIS — F329 Major depressive disorder, single episode, unspecified: Secondary | ICD-10-CM | POA: Diagnosis not present

## 2020-06-19 DIAGNOSIS — C5700 Malignant neoplasm of unspecified fallopian tube: Secondary | ICD-10-CM | POA: Diagnosis not present

## 2020-06-19 DIAGNOSIS — Z9841 Cataract extraction status, right eye: Secondary | ICD-10-CM | POA: Diagnosis not present

## 2020-06-19 DIAGNOSIS — Z9889 Other specified postprocedural states: Secondary | ICD-10-CM | POA: Diagnosis not present

## 2020-06-19 DIAGNOSIS — H2512 Age-related nuclear cataract, left eye: Secondary | ICD-10-CM | POA: Diagnosis not present

## 2020-06-19 DIAGNOSIS — E669 Obesity, unspecified: Secondary | ICD-10-CM | POA: Diagnosis not present

## 2020-06-19 DIAGNOSIS — Z86711 Personal history of pulmonary embolism: Secondary | ICD-10-CM | POA: Diagnosis not present

## 2020-07-05 ENCOUNTER — Other Ambulatory Visit: Payer: Self-pay | Admitting: Oncology

## 2020-07-05 ENCOUNTER — Encounter: Payer: Self-pay | Admitting: Oncology

## 2020-07-10 ENCOUNTER — Other Ambulatory Visit: Payer: Self-pay

## 2020-07-10 ENCOUNTER — Ambulatory Visit (HOSPITAL_COMMUNITY)
Admission: RE | Admit: 2020-07-10 | Discharge: 2020-07-10 | Disposition: A | Discharge status: Home / Self Care | Payer: BC Managed Care – PPO | Source: Ambulatory Visit | Attending: Oncology | Admitting: Oncology

## 2020-07-10 DIAGNOSIS — C5702 Malignant neoplasm of left fallopian tube: Secondary | ICD-10-CM | POA: Diagnosis not present

## 2020-07-10 DIAGNOSIS — N2889 Other specified disorders of kidney and ureter: Secondary | ICD-10-CM | POA: Diagnosis not present

## 2020-07-10 DIAGNOSIS — K661 Hemoperitoneum: Secondary | ICD-10-CM | POA: Diagnosis not present

## 2020-07-10 DIAGNOSIS — K439 Ventral hernia without obstruction or gangrene: Secondary | ICD-10-CM | POA: Diagnosis not present

## 2020-07-10 DIAGNOSIS — I700 Atherosclerosis of aorta: Secondary | ICD-10-CM | POA: Diagnosis not present

## 2020-07-10 MED ORDER — HEPARIN SOD (PORK) LOCK FLUSH 100 UNIT/ML IV SOLN
500.0000 [IU] | Freq: Once | INTRAVENOUS | Status: AC
  Administered 2020-07-10: 500 [IU] via INTRAVENOUS

## 2020-07-10 MED ORDER — HEPARIN SOD (PORK) LOCK FLUSH 100 UNIT/ML IV SOLN
INTRAVENOUS | Status: AC
  Filled 2020-07-10: qty 5

## 2020-07-10 MED ORDER — IOHEXOL 300 MG/ML  SOLN
100.0000 mL | Freq: Once | INTRAMUSCULAR | Status: AC | PRN
  Administered 2020-07-10: 100 mL via INTRAVENOUS

## 2020-07-10 MED ORDER — SODIUM CHLORIDE (PF) 0.9 % IJ SOLN
INTRAMUSCULAR | Status: AC
  Filled 2020-07-10: qty 50

## 2020-07-10 MED FILL — LYNPARZA 150 MG TABLET: 150 | 30 days supply | Qty: 120 | Fill #0

## 2020-07-10 MED FILL — XARELTO 20 MG TABLET: 20 | 90 days supply | Qty: 90 | Fill #1

## 2020-07-11 ENCOUNTER — Other Ambulatory Visit: Payer: BC Managed Care – PPO

## 2020-07-11 ENCOUNTER — Ambulatory Visit: Payer: BC Managed Care – PPO | Admitting: Nurse Practitioner

## 2020-07-12 ENCOUNTER — Other Ambulatory Visit: Payer: Self-pay

## 2020-07-12 ENCOUNTER — Inpatient Hospital Stay: Payer: BC Managed Care – PPO

## 2020-07-12 ENCOUNTER — Inpatient Hospital Stay: Payer: BC Managed Care – PPO | Attending: Oncology

## 2020-07-12 ENCOUNTER — Inpatient Hospital Stay (HOSPITAL_BASED_OUTPATIENT_CLINIC_OR_DEPARTMENT_OTHER): Payer: BC Managed Care – PPO | Admitting: Oncology

## 2020-07-12 VITALS — BP 128/72 | HR 89 | Temp 97.6°F | Resp 17 | Ht 63.0 in | Wt 172.3 lb

## 2020-07-12 DIAGNOSIS — Z86711 Personal history of pulmonary embolism: Secondary | ICD-10-CM | POA: Insufficient documentation

## 2020-07-12 DIAGNOSIS — K760 Fatty (change of) liver, not elsewhere classified: Secondary | ICD-10-CM | POA: Insufficient documentation

## 2020-07-12 DIAGNOSIS — Z23 Encounter for immunization: Secondary | ICD-10-CM

## 2020-07-12 DIAGNOSIS — E039 Hypothyroidism, unspecified: Secondary | ICD-10-CM | POA: Diagnosis not present

## 2020-07-12 DIAGNOSIS — Z9221 Personal history of antineoplastic chemotherapy: Secondary | ICD-10-CM | POA: Diagnosis not present

## 2020-07-12 DIAGNOSIS — K439 Ventral hernia without obstruction or gangrene: Secondary | ICD-10-CM | POA: Diagnosis not present

## 2020-07-12 DIAGNOSIS — F329 Major depressive disorder, single episode, unspecified: Secondary | ICD-10-CM | POA: Insufficient documentation

## 2020-07-12 DIAGNOSIS — Z7901 Long term (current) use of anticoagulants: Secondary | ICD-10-CM | POA: Insufficient documentation

## 2020-07-12 DIAGNOSIS — C786 Secondary malignant neoplasm of retroperitoneum and peritoneum: Secondary | ICD-10-CM | POA: Insufficient documentation

## 2020-07-12 DIAGNOSIS — C5702 Malignant neoplasm of left fallopian tube: Secondary | ICD-10-CM | POA: Diagnosis not present

## 2020-07-12 DIAGNOSIS — I700 Atherosclerosis of aorta: Secondary | ICD-10-CM | POA: Insufficient documentation

## 2020-07-12 DIAGNOSIS — G629 Polyneuropathy, unspecified: Secondary | ICD-10-CM | POA: Diagnosis not present

## 2020-07-12 DIAGNOSIS — Z95828 Presence of other vascular implants and grafts: Secondary | ICD-10-CM

## 2020-07-12 DIAGNOSIS — C5700 Malignant neoplasm of unspecified fallopian tube: Secondary | ICD-10-CM | POA: Diagnosis not present

## 2020-07-12 LAB — CMP (CANCER CENTER ONLY)
ALT: 15 U/L (ref 0–44)
AST: 15 U/L (ref 15–41)
Albumin: 3.5 g/dL (ref 3.5–5.0)
Alkaline Phosphatase: 73 U/L (ref 38–126)
Anion gap: 8 (ref 5–15)
BUN: 11 mg/dL (ref 8–23)
CO2: 26 mmol/L (ref 22–32)
Calcium: 9.5 mg/dL (ref 8.9–10.3)
Chloride: 107 mmol/L (ref 98–111)
Creatinine: 0.98 mg/dL (ref 0.44–1.00)
GFR, Est AFR Am: >60 mL/min (ref >60)
GFR, Estimated: >60 mL/min (ref >60)
Glucose, Bld: 116 mg/dL — ABNORMAL HIGH (ref 70–99)
Potassium: 3.7 mmol/L (ref 3.5–5.1)
Sodium: 141 mmol/L (ref 135–145)
Total Bilirubin: 0.3 mg/dL (ref 0.3–1.2)
Total Protein: 6.8 g/dL (ref 6.5–8.1)

## 2020-07-12 LAB — CBC WITH DIFFERENTIAL (CANCER CENTER ONLY)
Abs Immature Granulocytes: 0.04 10*3/uL (ref 0.00–0.07)
Basophils Absolute: 0 10*3/uL (ref 0.0–0.1)
Basophils Relative: 0 %
Eosinophils Absolute: 0.1 10*3/uL (ref 0.0–0.5)
Eosinophils Relative: 1 %
HCT: 29.5 % — ABNORMAL LOW (ref 36.0–46.0)
Hemoglobin: 9.9 g/dL — ABNORMAL LOW (ref 12.0–15.0)
Immature Granulocytes: 1 %
Lymphocytes Relative: 32 %
Lymphs Abs: 1.9 10*3/uL (ref 0.7–4.0)
MCH: 37.9 pg — ABNORMAL HIGH (ref 26.0–34.0)
MCHC: 33.6 g/dL (ref 30.0–36.0)
MCV: 113 fL — ABNORMAL HIGH (ref 80.0–100.0)
Monocytes Absolute: 0.6 10*3/uL (ref 0.1–1.0)
Monocytes Relative: 9 %
Neutro Abs: 3.4 10*3/uL (ref 1.7–7.7)
Neutrophils Relative %: 57 %
Platelet Count: 202 10*3/uL (ref 150–400)
RBC: 2.61 MIL/uL — ABNORMAL LOW (ref 3.87–5.11)
RDW: 16 % — ABNORMAL HIGH (ref 11.5–15.5)
WBC Count: 6 10*3/uL (ref 4.0–10.5)
nRBC: 0.3 % — ABNORMAL HIGH (ref 0.0–0.2)

## 2020-07-12 MED ORDER — HEPARIN SOD (PORK) LOCK FLUSH 100 UNIT/ML IV SOLN
500.0000 [IU] | Freq: Once | INTRAVENOUS | Status: AC | PRN
  Administered 2020-07-12: 500 [IU]
  Filled 2020-07-12: qty 5

## 2020-07-12 MED ORDER — SODIUM CHLORIDE 0.9% FLUSH
10.0000 mL | INTRAVENOUS | Status: DC | PRN
  Administered 2020-07-12: 10 mL
  Filled 2020-07-12: qty 10

## 2020-07-12 NOTE — Progress Notes (Signed)
ON PATHWAY REGIMEN - Ovarian  No Change  Continue With Treatment as Ordered.  Original Decision Date/Time: 07/28/2017 17:55     A cycle is every 21 days:     Paclitaxel      Carboplatin   **Always confirm dose/schedule in your pharmacy ordering system**  Patient Characteristics: Newly Diagnosed, Adjuvant Therapy, Stage IIB and Stage IIIA/B/C Optimal Cytoreduction AJCC T Category: Staged < 8th Ed. AJCC N Category: Staged < 8th Ed. AJCC M Category: Staged < 8th Ed. Therapeutic Status: Newly Diagnosed, Adjuvant Therapy AJCC 8 Stage Grouping: Staged < 8th Ed. Intent of Therapy: Curative Intent, Discussed with Patient

## 2020-07-12 NOTE — Patient Instructions (Signed)
Please provide a copy of your medical advanced directive/living will to have scanned into your chart.

## 2020-07-12 NOTE — Progress Notes (Signed)
[place] OFFICE PROGRESS NOTE   Diagnosis: Fallopian tube carcinoma  INTERVAL HISTORY:   Ms. Capron returns as scheduled.  She feels well.  She has mild intermittent discomfort in the left lower abdomen.  No difficulty with bowel function.  No other complaint.  No neuropathy symptoms.  Objective:  Vital signs in last 24 hours:  Blood pressure 128/72, pulse 89, temperature 97.6 F (36.4 C), temperature source Axillary, resp. rate 17, height _0  (1.6 m), weight 172 lb 4.8 oz (78.2 kg), SpO2 100 %.  Resp: Lungs clear bilaterally Cardio: Regular rate and rhythm GI: Soft and nontender, no mass, no hepatosplenomegaly, no apparent ascites Vascular: No leg edema    Portacath/PICC-without erythema  Lab Results:  Lab Results  Component Value Date   WBC 6.0 07/12/2020   HGB 9.9 (L) 07/12/2020   HCT 29.5 (L) 07/12/2020   MCV 113.0 (H) 07/12/2020   PLT 202 07/12/2020   NEUTROABS 3.4 07/12/2020    CMP  Lab Results  Component Value Date   NA 139 06/13/2020   K 4.2 06/13/2020   CL 105 06/13/2020   CO2 24 06/13/2020   GLUCOSE 93 06/13/2020   BUN 13 06/13/2020   CREATININE 0.99 06/13/2020   CALCIUM 9.5 06/13/2020   PROT 6.8 06/13/2020   ALBUMIN 3.4 (L) 06/13/2020   AST 17 06/13/2020   ALT 16 06/13/2020   ALKPHOS 70 06/13/2020   BILITOT 0.6 06/13/2020   GFRNONAA >60 06/13/2020   GFRAA >60 06/13/2020    Lab Results  Component Value Date   CEA1 <1.00 06/27/2017    Imaging:  CT ABDOMEN PELVIS W CONTRAST  Result Date: 07/10/2020 CLINICAL DATA:  Increasing tumor markers, history of of fallopian tube neoplasm. EXAM: CT ABDOMEN AND PELVIS WITH CONTRAST TECHNIQUE: Multidetector CT imaging of the abdomen and pelvis was performed using the standard protocol following bolus administration of intravenous contrast. CONTRAST:  116m OMNIPAQUE IOHEXOL 300 MG/ML  SOLN COMPARISON:  November 17, 2019 FINDINGS: Lower chest: No effusion.  No consolidation.  Hepatobiliary: Hepatic steatosis, no focal, suspicious hepatic lesion. No pericholecystic stranding. Portal vein is patent. Hepatic veins are patent. Pancreas: Pancreas normal without ductal dilation or inflammation. Spleen: Spleen normal in size and contour. Adrenals/Urinary Tract: Adrenal glands are normal. Small cyst arises from upper pole of the RIGHT kidney. No hydronephrosis. Urinary bladder is under distended, otherwise unremarkable. Along the course of the distal LEFT ureter is and enlarging focus of soft tissue, more similar to its size in September of 2020, actually larger than it was at this time, now measuring approximately 1.3 x 2.1 cm see below for full details regarding peritoneal and nodal disease the ureter is not dilated as a result of this process. Stomach/Bowel: Small ventral hernia contains a loop of small bowel. No acute gastrointestinal process. Pericolonic soft tissue implant adjacent to the splenic flexure Vascular/Lymphatic: Scattered atheromatous plaque in the abdominal aorta without aneurysmal dilation. LEFT superior rectal or retroperitoneal lymph node (image 55, series 2) 11 mm previously 7 mm Lymph nodes likely along the course of distal IMV branches (image 65, series 2) 14 x 11 mm previously approximately 6 mm. This lymph node is larger than it was in September of 2020. Reproductive: Post hysterectomy. Other: Peritoneal nodule in the LEFT upper quadrant adjacent to the splenic flexure of the colon (image 27, series 2) 1 cm. In retrospect this was present as a very small nodule on the study of September of 2020 but not seen in December of  2020. This measured approximately 6-7 mm in September of 2020. Musculoskeletal: No acute musculoskeletal process. No destructive bone finding. Spinal degenerative changes. IMPRESSION: 1. Enlarging lymph nodes in signs of peritoneal disease since the prior study. 2. Soft tissue along the course of the LEFT ureter in the LEFT hemipelvis adjacent to  internal iliac vasculature, larger than in September of 2020 and in close proximity to the ureter, attention on follow-up for any signs developing ureteral dilation. 3. Eccentric thickening of the sigmoid is not seen on today's study. Attention on follow-up. 4. hepatic steatosis. 5. Small ventral hernia contains a loop of small bowel. No acute gastrointestinal process. 6. Aortic atherosclerosis. Aortic Atherosclerosis (ICD10-I70.0). Electronically Signed   By: Zetta Bills M.D.   On: 07/10/2020 16:03    Medications: I have reviewed the patient's current medications.   Assessment/Plan: 1. Left abdomen/pelvic pain ? CT abdomen/pelvis 06/26/2017-soft tissue implants in the lower anterior peritoneum with implants at the paracolic gutters and left pelvic sidewall ? NegativeMYRIADhereditary cancer panel ? Elevated CA 125 ? CT biopsy of anterior omental mass 07/04/2017-Metastatic carcinoma consistent with a gynecologic primary ? 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 Cycle 1 adjuvant Taxol/carboplatin 08/05/2017 ? Cycle 2 adjuvant Taxol/carboplatin 08/26/2017 ? Cycle 3 adjuvant Taxol/carboplatin 09/17/2017-Taxol dose reduced secondary to neuropathy and bone pain ? Cycle 4 adjuvant Taxol/carboplatin 10/10/2017 ? Cycle 5 adjuvant Taxol/carboplatin 10/31/2017 ? Cycle 6 adjuvant Taxol/carboplatin 11/21/2017 ? CT abdomen/pelvis 08/08/2019-narrowing of rectosigmoid colon with eccentric serosal implant, 10 mm lymph node at the superior rectal vein, left upper quadrant soft tissue implant, 9 mm precaval node, 15 mm left internal iliac  node, no ascites ? Cycle 1 Taxol/carboplatin 08/31/2019 ? Cycle 2 Taxol/carboplatin/Avastin 09/21/2019 ? Cycle 3 Taxol/Carboplatin 10/12/2019 (Avastin discontinued) ? Cycle 4 Taxol/carboplatin November 02, 2019 ? CT abdomen/pelvis 11/17/2019-decrease in size of previously prominent pericaval, left internal iliac, and superior rectal vein nodes, resolved rectosigmoid wall thickening ? Cycle 5 Taxol/carboplatin 11/22/2019 ? Cycle 6 Taxol/carboplatin 12/13/2019 ? Olaparib2/17/2021 ? CT abdomen/pelvis 07/10/2020-enlarging soft tissue at the distal left ureter, the ureter is not dilated, peritoneal implant adjacent to the splenic flexure, enlarged left superior rectal node, lymph nodes along the course of the distal IMV have enlarged, eccentric thickening of the sigmoid colon not seen  ? Olaraparib discontinued 07/12/2020  2. Depression  3. Hypothyroid  4. Family history of uterine cancer-paternal grandmother  4.Acute onset dyspnea/pleuritic left-sided chest pain 10/02/2019  CT chest 10/04/2019-left lower lobe pulmonary emboli, multifocal pneumonia lower lobes  On Xarelto, status post course of Levaquin.      Disposition: Ms. Ruppert has Fallopian tube carcinoma with carcinomatosis.  The restaging CT 07/10/2020 reveals evidence of disease progression involving peritoneal implants and pelvic lymph nodes.  The CA-125 has increased over the past several months.  I discussed the CT findings and reviewed the images with Ms. Alpern and her husband.  We discussed treatment options including observation, repeat treatment with Taxol/carboplatin, other salvage chemotherapeutic agents, and referral for clinical trial.  She prefers Taxol/carboplatin.  There has been been no disease progression on this regimen to date.  We reviewed potential toxicities associated with Taxol/carboplatin including the chance of hematologic toxicity, alopecia, and allergic reaction, and neuropathy.  She agrees to  proceed.  Ms. Gauger will return for cycle 1 Taxol/carboplatin on 07/18/2020.  She will be scheduled for an office visit in the next cycle of Taxol/carboplatin on 08/08/2020.  She received the COVID-19 booster vaccine today.  [name], MD  07/12/2020  11:59 AM

## 2020-07-13 LAB — CA 125: Cancer Antigen (CA) 125: 55.1 U/mL — ABNORMAL HIGH (ref 0.0–38.1)

## 2020-07-15 ENCOUNTER — Encounter: Payer: Self-pay | Admitting: Oncology

## 2020-07-17 ENCOUNTER — Telehealth: Payer: Self-pay

## 2020-07-17 NOTE — Telephone Encounter (Signed)
Prior authorization request made for emla cream. Authorization form will be faxed on 07/17/20 per Caryl Pina with [facility]. Phone number [phone], option 3, option 1.

## 2020-07-17 NOTE — Progress Notes (Signed)
Pharmacist Chemotherapy Monitoring - Initial Assessment    Anticipated start date: 07/21/20  Regimen:   Are orders appropriate based on the patients diagnosis, regimen, and cycle? Yes  Does the plan date match the patients scheduled date? Yes  Is the sequencing of drugs appropriate? Yes  Are the premedications appropriate for the patients regimen? Yes  Prior Authorization for treatment is: Approved o If applicable, is the correct biosimilar selected based on the patient's insurance? not applicable  Organ Function and Labs:  Are dose adjustments needed based on the patient's renal function, hepatic function, or hematologic function? Yes  Are appropriate labs ordered prior to the start of patient's treatment? Yes  Other organ system assessment, if indicated: N/A  The following baseline labs, if indicated, have been ordered: N/A  Dose Assessment:  Are the drug doses appropriate? Yes  Are the following correct: o Drug concentrations Yes o IV fluid compatible with drug Yes o Administration routes Yes o Timing of therapy Yes  If applicable, does the patient have documented access for treatment and/or plans for port-a-cath placement? yes  If applicable, have lifetime cumulative doses been properly documented and assessed? yes Lifetime Dose Tracking   Carboplatin: 6,260.2 mg = 0.01 % of the maximum lifetime dose of 999,999,999 mg  o   Toxicity Monitoring/Prevention:  The patient has the following take home antiemetics prescribed: Ondansetron  The patient has the following take home medications prescribed: N/A  Medication allergies and previous infusion related reactions, if applicable, have been reviewed and addressed. Yes  The patient's current medication list has been assessed for drug-drug interactions with their chemotherapy regimen. no significant drug-drug interactions were identified on review.  Order Review:  Are the treatment plan orders signed? No  Is the  patient scheduled to see a provider prior to their treatment? No  I verify that I have reviewed each item in the above checklist and answered each question accordingly.  Philomena Course 07/17/2020 8:41 AM

## 2020-07-18 ENCOUNTER — Other Ambulatory Visit: Payer: Self-pay | Admitting: Oncology

## 2020-07-19 ENCOUNTER — Other Ambulatory Visit: Payer: Self-pay | Admitting: *Deleted

## 2020-07-19 MED ORDER — LIDOCAINE-PRILOCAINE 2.5-2.5 % EX CREA: 1.0000 "application " | TOPICAL_CREAM | CUTANEOUS | 3 refills | Status: DC

## 2020-07-20 ENCOUNTER — Other Ambulatory Visit: Payer: Self-pay | Admitting: *Deleted

## 2020-07-20 DIAGNOSIS — C5702 Malignant neoplasm of left fallopian tube: Secondary | ICD-10-CM

## 2020-07-21 ENCOUNTER — Other Ambulatory Visit: Payer: Self-pay

## 2020-07-21 ENCOUNTER — Inpatient Hospital Stay: Payer: BC Managed Care – PPO

## 2020-07-21 ENCOUNTER — Inpatient Hospital Stay: Payer: BC Managed Care – PPO | Attending: Oncology

## 2020-07-21 VITALS — BP 110/76 | HR 89 | Temp 97.8°F | Resp 18 | Ht 63.0 in | Wt 172.5 lb

## 2020-07-21 DIAGNOSIS — T451X5A Adverse effect of antineoplastic and immunosuppressive drugs, initial encounter: Secondary | ICD-10-CM | POA: Insufficient documentation

## 2020-07-21 DIAGNOSIS — D6959 Other secondary thrombocytopenia: Secondary | ICD-10-CM | POA: Insufficient documentation

## 2020-07-21 DIAGNOSIS — Z23 Encounter for immunization: Secondary | ICD-10-CM | POA: Insufficient documentation

## 2020-07-21 DIAGNOSIS — Z86711 Personal history of pulmonary embolism: Secondary | ICD-10-CM | POA: Diagnosis not present

## 2020-07-21 DIAGNOSIS — C5700 Malignant neoplasm of unspecified fallopian tube: Secondary | ICD-10-CM | POA: Diagnosis not present

## 2020-07-21 DIAGNOSIS — R110 Nausea: Secondary | ICD-10-CM | POA: Diagnosis not present

## 2020-07-21 DIAGNOSIS — G620 Drug-induced polyneuropathy: Secondary | ICD-10-CM | POA: Insufficient documentation

## 2020-07-21 DIAGNOSIS — Z7901 Long term (current) use of anticoagulants: Secondary | ICD-10-CM | POA: Insufficient documentation

## 2020-07-21 DIAGNOSIS — E039 Hypothyroidism, unspecified: Secondary | ICD-10-CM | POA: Diagnosis not present

## 2020-07-21 DIAGNOSIS — C5702 Malignant neoplasm of left fallopian tube: Secondary | ICD-10-CM

## 2020-07-21 DIAGNOSIS — Z95828 Presence of other vascular implants and grafts: Secondary | ICD-10-CM

## 2020-07-21 DIAGNOSIS — Z5111 Encounter for antineoplastic chemotherapy: Secondary | ICD-10-CM | POA: Insufficient documentation

## 2020-07-21 DIAGNOSIS — Z8049 Family history of malignant neoplasm of other genital organs: Secondary | ICD-10-CM | POA: Diagnosis not present

## 2020-07-21 DIAGNOSIS — F329 Major depressive disorder, single episode, unspecified: Secondary | ICD-10-CM | POA: Insufficient documentation

## 2020-07-21 LAB — CBC WITH DIFFERENTIAL (CANCER CENTER ONLY)
Abs Immature Granulocytes: 0.04 10*3/uL (ref 0.00–0.07)
Basophils Absolute: 0 10*3/uL (ref 0.0–0.1)
Basophils Relative: 1 %
Eosinophils Absolute: 0.1 10*3/uL (ref 0.0–0.5)
Eosinophils Relative: 2 %
HCT: 31.2 % — ABNORMAL LOW (ref 36.0–46.0)
Hemoglobin: 10.3 g/dL — ABNORMAL LOW (ref 12.0–15.0)
Immature Granulocytes: 1 %
Lymphocytes Relative: 38 %
Lymphs Abs: 2.4 10*3/uL (ref 0.7–4.0)
MCH: 37.1 pg — ABNORMAL HIGH (ref 26.0–34.0)
MCHC: 33 g/dL (ref 30.0–36.0)
MCV: 112.2 fL — ABNORMAL HIGH (ref 80.0–100.0)
Monocytes Absolute: 0.8 10*3/uL (ref 0.1–1.0)
Monocytes Relative: 12 %
Neutro Abs: 3 10*3/uL (ref 1.7–7.7)
Neutrophils Relative %: 46 %
Platelet Count: 239 10*3/uL (ref 150–400)
RBC: 2.78 MIL/uL — ABNORMAL LOW (ref 3.87–5.11)
RDW: 15.6 % — ABNORMAL HIGH (ref 11.5–15.5)
WBC Count: 6.3 10*3/uL (ref 4.0–10.5)
nRBC: 0 % (ref 0.0–0.2)

## 2020-07-21 LAB — CMP (CANCER CENTER ONLY)
ALT: 15 U/L (ref 0–44)
AST: 17 U/L (ref 15–41)
Albumin: 3.6 g/dL (ref 3.5–5.0)
Alkaline Phosphatase: 79 U/L (ref 38–126)
Anion gap: 13 (ref 5–15)
BUN: 15 mg/dL (ref 8–23)
CO2: 21 mmol/L — ABNORMAL LOW (ref 22–32)
Calcium: 9.2 mg/dL (ref 8.9–10.3)
Chloride: 104 mmol/L (ref 98–111)
Creatinine: 0.84 mg/dL (ref 0.44–1.00)
GFR, Est AFR Am: >60 mL/min (ref >60)
GFR, Estimated: >60 mL/min (ref >60)
Glucose, Bld: 109 mg/dL — ABNORMAL HIGH (ref 70–99)
Potassium: 3.9 mmol/L (ref 3.5–5.1)
Sodium: 138 mmol/L (ref 135–145)
Total Bilirubin: 0.3 mg/dL (ref 0.3–1.2)
Total Protein: 7.3 g/dL (ref 6.5–8.1)

## 2020-07-21 MED ORDER — SODIUM CHLORIDE 0.9% FLUSH
10.0000 mL | INTRAVENOUS | Status: DC | PRN
  Administered 2020-07-21: 10 mL
  Filled 2020-07-21: qty 10

## 2020-07-21 MED ORDER — SODIUM CHLORIDE 0.9 % IV SOLN
10.0000 mg | Freq: Once | INTRAVENOUS | Status: AC
  Administered 2020-07-21: 10 mg via INTRAVENOUS
  Filled 2020-07-21: qty 10

## 2020-07-21 MED ORDER — FAMOTIDINE IN NACL 20-0.9 MG/50ML-% IV SOLN
20.0000 mg | Freq: Once | INTRAVENOUS | Status: AC
  Administered 2020-07-21: 20 mg via INTRAVENOUS

## 2020-07-21 MED ORDER — PALONOSETRON HCL INJECTION 0.25 MG/5ML
0.2500 mg | Freq: Once | INTRAVENOUS | Status: AC
  Administered 2020-07-21: 0.25 mg via INTRAVENOUS

## 2020-07-21 MED ORDER — SODIUM CHLORIDE 0.9 % IV SOLN
150.0000 mg | Freq: Once | INTRAVENOUS | Status: AC
  Administered 2020-07-21: 150 mg via INTRAVENOUS
  Filled 2020-07-21: qty 150

## 2020-07-21 MED ORDER — PALONOSETRON HCL INJECTION 0.25 MG/5ML
INTRAVENOUS | Status: AC
  Filled 2020-07-21: qty 5

## 2020-07-21 MED ORDER — SODIUM CHLORIDE 0.9 % IV SOLN
140.0000 mg/m2 | Freq: Once | INTRAVENOUS | Status: AC
  Administered 2020-07-21: 258 mg via INTRAVENOUS
  Filled 2020-07-21: qty 43

## 2020-07-21 MED ORDER — DIPHENHYDRAMINE HCL 50 MG/ML IJ SOLN
INTRAMUSCULAR | Status: AC
  Filled 2020-07-21: qty 1

## 2020-07-21 MED ORDER — SODIUM CHLORIDE 0.9 % IV SOLN
493.2000 mg | Freq: Once | INTRAVENOUS | Status: AC
  Administered 2020-07-21: 490 mg via INTRAVENOUS
  Filled 2020-07-21: qty 49

## 2020-07-21 MED ORDER — HEPARIN SOD (PORK) LOCK FLUSH 100 UNIT/ML IV SOLN
500.0000 [IU] | Freq: Once | INTRAVENOUS | Status: AC | PRN
  Administered 2020-07-21: 500 [IU]
  Filled 2020-07-21: qty 5

## 2020-07-21 MED ORDER — FAMOTIDINE IN NACL 20-0.9 MG/50ML-% IV SOLN
INTRAVENOUS | Status: AC
  Filled 2020-07-21: qty 50

## 2020-07-21 MED ORDER — DIPHENHYDRAMINE HCL 50 MG/ML IJ SOLN
50.0000 mg | Freq: Once | INTRAMUSCULAR | Status: AC
  Administered 2020-07-21: 50 mg via INTRAVENOUS

## 2020-07-21 MED ORDER — SODIUM CHLORIDE 0.9 % IV SOLN
Freq: Once | INTRAVENOUS | Status: AC
  Filled 2020-07-21: qty 250

## 2020-07-21 NOTE — Patient Instructions (Signed)
[facility] Discharge Instructions for Patients Receiving Chemotherapy  Today you received the following chemotherapy agents Paclitaxel (TAXOL) & Carboplatin (PARAPLATIN).  To help prevent nausea and vomiting after your treatment, we encourage you to take your nausea medication as prescribed.   If you develop nausea and vomiting that is not controlled by your nausea medication, call the clinic.   BELOW ARE SYMPTOMS THAT SHOULD BE REPORTED IMMEDIATELY:  *FEVER GREATER THAN 100.5 F  *CHILLS WITH OR WITHOUT FEVER  NAUSEA AND VOMITING THAT IS NOT CONTROLLED WITH YOUR NAUSEA MEDICATION  *UNUSUAL SHORTNESS OF BREATH  *UNUSUAL BRUISING OR BLEEDING  TENDERNESS IN MOUTH AND THROAT WITH OR WITHOUT PRESENCE OF ULCERS  *URINARY PROBLEMS  *BOWEL PROBLEMS  UNUSUAL RASH Items with * indicate a potential emergency and should be followed up as soon as possible.  Feel free to call the clinic should you have any questions or concerns. The clinic phone number is [phone].  Please show the CHEMO ALERT CARD at check-in to the Emergency Department and triage nurse.  Paclitaxel injection What is this medicine? PACLITAXEL (PAK li TAX el) is a chemotherapy drug. It targets fast dividing cells, like cancer cells, and causes these cells to die. This medicine is used to treat ovarian cancer, breast cancer, lung cancer, Kaposi's sarcoma, and other cancers. This medicine may be used for other purposes; ask your health care provider or pharmacist if you have questions. COMMON BRAND NAME(S): Onxol, Taxol What should I tell my health care provider before I take this medicine? They need to know if you have any of these conditions:  history of irregular heartbeat  liver disease  low blood counts, like low white cell, platelet, or red cell counts  lung or breathing disease, like asthma  tingling of the fingers or toes, or other nerve disorder  an unusual or allergic reaction to  paclitaxel, alcohol, polyoxyethylated castor oil, other chemotherapy, other medicines, foods, dyes, or preservatives  pregnant or trying to get pregnant  breast-feeding How should I use this medicine? This drug is given as an infusion into a vein. It is administered in a hospital or clinic by a specially trained health care professional. Talk to your pediatrician regarding the use of this medicine in children. Special care may be needed. Overdosage: If you think you have taken too much of this medicine contact a poison control center or emergency room at once. NOTE: This medicine is only for you. Do not share this medicine with others. What if I miss a dose? It is important not to miss your dose. Call your doctor or health care professional if you are unable to keep an appointment. What may interact with this medicine? Do not take this medicine with any of the following medications:  disulfiram  metronidazole This medicine may also interact with the following medications:  antiviral medicines for hepatitis, HIV or AIDS  certain antibiotics like erythromycin and clarithromycin  certain medicines for fungal infections like ketoconazole and itraconazole  certain medicines for seizures like carbamazepine, phenobarbital, phenytoin  gemfibrozil  nefazodone  rifampin  St. John's wort This list may not describe all possible interactions. Give your health care provider a list of all the medicines, herbs, non-prescription drugs, or dietary supplements you use. Also tell them if you smoke, drink alcohol, or use illegal drugs. Some items may interact with your medicine. What should I watch for while using this medicine? Your condition will be monitored carefully while you are receiving this medicine. You will   need important blood work done while you are taking this medicine. This medicine can cause serious allergic reactions. To reduce your risk you will need to take other medicine(s)  before treatment with this medicine. If you experience allergic reactions like skin rash, itching or hives, swelling of the face, lips, or tongue, tell your doctor or health care professional right away. In some cases, you may be given additional medicines to help with side effects. Follow all directions for their use. This drug may make you feel generally unwell. This is not uncommon, as chemotherapy can affect healthy cells as well as cancer cells. Report any side effects. Continue your course of treatment even though you feel ill unless your doctor tells you to stop. Call your doctor or health care professional for advice if you get a fever, chills or sore throat, or other symptoms of a cold or flu. Do not treat yourself. This drug decreases your body's ability to fight infections. Try to avoid being around people who are sick. This medicine may increase your risk to bruise or bleed. Call your doctor or health care professional if you notice any unusual bleeding. Be careful brushing and flossing your teeth or using a toothpick because you may get an infection or bleed more easily. If you have any dental work done, tell your dentist you are receiving this medicine. Avoid taking products that contain aspirin, acetaminophen, ibuprofen, naproxen, or ketoprofen unless instructed by your doctor. These medicines may hide a fever. Do not become pregnant while taking this medicine. Women should inform their doctor if they wish to become pregnant or think they might be pregnant. There is a potential for serious side effects to an unborn child. Talk to your health care professional or pharmacist for more information. Do not breast-feed an infant while taking this medicine. Men are advised not to father a child while receiving this medicine. This product may contain alcohol. Ask your pharmacist or healthcare provider if this medicine contains alcohol. Be sure to tell all healthcare providers you are taking this  medicine. Certain medicines, like metronidazole and disulfiram, can cause an unpleasant reaction when taken with alcohol. The reaction includes flushing, headache, nausea, vomiting, sweating, and increased thirst. The reaction can last from 30 minutes to several hours. What side effects may I notice from receiving this medicine? Side effects that you should report to your doctor or health care professional as soon as possible:  allergic reactions like skin rash, itching or hives, swelling of the face, lips, or tongue  breathing problems  changes in vision  fast, irregular heartbeat  high or low blood pressure  mouth sores  pain, tingling, numbness in the hands or feet  signs of decreased platelets or bleeding - bruising, pinpoint red spots on the skin, black, tarry stools, blood in the urine  signs of decreased red blood cells - unusually weak or tired, feeling faint or lightheaded, falls  signs of infection - fever or chills, cough, sore throat, pain or difficulty passing urine  signs and symptoms of liver injury like dark yellow or brown urine; general ill feeling or flu-like symptoms; light-colored stools; loss of appetite; nausea; right upper belly pain; unusually weak or tired; yellowing of the eyes or skin  swelling of the ankles, feet, hands  unusually slow heartbeat Side effects that usually do not require medical attention (report to your doctor or health care professional if they continue or are bothersome):  diarrhea  hair loss  loss of appetite  muscle   or joint pain  nausea, vomiting  pain, redness, or irritation at site where injected  tiredness This list may not describe all possible side effects. Call your doctor for medical advice about side effects. You may report side effects to FDA at 1-800-FDA-1088. Where should I keep my medicine? This drug is given in a hospital or clinic and will not be stored at home. NOTE: This sheet is a summary. It may not  cover all possible information. If you have questions about this medicine, talk to your doctor, pharmacist, or health care provider.  2020 Elsevier/Gold Standard (2017-07-08 13:14:55)  Carboplatin injection What is this medicine? CARBOPLATIN (KAR boe pla tin) is a chemotherapy drug. It targets fast dividing cells, like cancer cells, and causes these cells to die. This medicine is used to treat ovarian cancer and many other cancers. This medicine may be used for other purposes; ask your health care provider or pharmacist if you have questions. COMMON BRAND NAME(S): Paraplatin What should I tell my health care provider before I take this medicine? They need to know if you have any of these conditions:  blood disorders  hearing problems  kidney disease  recent or ongoing radiation therapy  an unusual or allergic reaction to carboplatin, cisplatin, other chemotherapy, other medicines, foods, dyes, or preservatives  pregnant or trying to get pregnant  breast-feeding How should I use this medicine? This drug is usually given as an infusion into a vein. It is administered in a hospital or clinic by a specially trained health care professional. Talk to your pediatrician regarding the use of this medicine in children. Special care may be needed. Overdosage: If you think you have taken too much of this medicine contact a poison control center or emergency room at once. NOTE: This medicine is only for you. Do not share this medicine with others. What if I miss a dose? It is important not to miss a dose. Call your doctor or health care professional if you are unable to keep an appointment. What may interact with this medicine?  medicines for seizures  medicines to increase blood counts like filgrastim, pegfilgrastim, sargramostim  some antibiotics like amikacin, gentamicin, neomycin, streptomycin, tobramycin  vaccines Talk to your doctor or health care professional before taking any of  these medicines:  acetaminophen  aspirin  ibuprofen  ketoprofen  naproxen This list may not describe all possible interactions. Give your health care provider a list of all the medicines, herbs, non-prescription drugs, or dietary supplements you use. Also tell them if you smoke, drink alcohol, or use illegal drugs. Some items may interact with your medicine. What should I watch for while using this medicine? Your condition will be monitored carefully while you are receiving this medicine. You will need important blood work done while you are taking this medicine. This drug may make you feel generally unwell. This is not uncommon, as chemotherapy can affect healthy cells as well as cancer cells. Report any side effects. Continue your course of treatment even though you feel ill unless your doctor tells you to stop. In some cases, you may be given additional medicines to help with side effects. Follow all directions for their use. Call your doctor or health care professional for advice if you get a fever, chills or sore throat, or other symptoms of a cold or flu. Do not treat yourself. This drug decreases your body's ability to fight infections. Try to avoid being around people who are sick. This medicine may increase your risk   to bruise or bleed. Call your doctor or health care professional if you notice any unusual bleeding. Be careful brushing and flossing your teeth or using a toothpick because you may get an infection or bleed more easily. If you have any dental work done, tell your dentist you are receiving this medicine. Avoid taking products that contain aspirin, acetaminophen, ibuprofen, naproxen, or ketoprofen unless instructed by your doctor. These medicines may hide a fever. Do not become pregnant while taking this medicine. Women should inform their doctor if they wish to become pregnant or think they might be pregnant. There is a potential for serious side effects to an unborn child.  Talk to your health care professional or pharmacist for more information. Do not breast-feed an infant while taking this medicine. What side effects may I notice from receiving this medicine? Side effects that you should report to your doctor or health care professional as soon as possible:  allergic reactions like skin rash, itching or hives, swelling of the face, lips, or tongue  signs of infection - fever or chills, cough, sore throat, pain or difficulty passing urine  signs of decreased platelets or bleeding - bruising, pinpoint red spots on the skin, black, tarry stools, nosebleeds  signs of decreased red blood cells - unusually weak or tired, fainting spells, lightheadedness  breathing problems  changes in hearing  changes in vision  chest pain  high blood pressure  low blood counts - This drug may decrease the number of white blood cells, red blood cells and platelets. You may be at increased risk for infections and bleeding.  nausea and vomiting  pain, swelling, redness or irritation at the injection site  pain, tingling, numbness in the hands or feet  problems with balance, talking, walking  trouble passing urine or change in the amount of urine Side effects that usually do not require medical attention (report to your doctor or health care professional if they continue or are bothersome):  hair loss  loss of appetite  metallic taste in the mouth or changes in taste This list may not describe all possible side effects. Call your doctor for medical advice about side effects. You may report side effects to FDA at 1-800-FDA-1088. Where should I keep my medicine? This drug is given in a hospital or clinic and will not be stored at home. NOTE: This sheet is a summary. It may not cover all possible information. If you have questions about this medicine, talk to your doctor, pharmacist, or health care provider.  2020 Elsevier/Gold Standard (2008-02-09 14:38:05)  

## 2020-07-30 ENCOUNTER — Encounter: Payer: Self-pay | Admitting: Oncology

## 2020-08-05 ENCOUNTER — Other Ambulatory Visit: Payer: Self-pay | Admitting: Internal Medicine

## 2020-08-06 ENCOUNTER — Other Ambulatory Visit: Payer: Self-pay | Admitting: Oncology

## 2020-08-08 ENCOUNTER — Other Ambulatory Visit: Payer: BC Managed Care – PPO

## 2020-08-08 ENCOUNTER — Ambulatory Visit: Payer: BC Managed Care – PPO

## 2020-08-08 ENCOUNTER — Ambulatory Visit: Payer: BC Managed Care – PPO | Admitting: Oncology

## 2020-08-10 ENCOUNTER — Other Ambulatory Visit: Payer: Self-pay

## 2020-08-10 ENCOUNTER — Inpatient Hospital Stay: Payer: BC Managed Care – PPO

## 2020-08-10 ENCOUNTER — Other Ambulatory Visit: Payer: BC Managed Care – PPO

## 2020-08-10 ENCOUNTER — Inpatient Hospital Stay (HOSPITAL_BASED_OUTPATIENT_CLINIC_OR_DEPARTMENT_OTHER): Payer: BC Managed Care – PPO | Admitting: Oncology

## 2020-08-10 VITALS — BP 116/72 | HR 92 | Temp 97.7°F | Resp 17 | Ht 63.0 in | Wt 174.0 lb

## 2020-08-10 DIAGNOSIS — T451X5A Adverse effect of antineoplastic and immunosuppressive drugs, initial encounter: Secondary | ICD-10-CM | POA: Diagnosis not present

## 2020-08-10 DIAGNOSIS — Z95828 Presence of other vascular implants and grafts: Secondary | ICD-10-CM

## 2020-08-10 DIAGNOSIS — Z7901 Long term (current) use of anticoagulants: Secondary | ICD-10-CM | POA: Diagnosis not present

## 2020-08-10 DIAGNOSIS — C5702 Malignant neoplasm of left fallopian tube: Secondary | ICD-10-CM

## 2020-08-10 DIAGNOSIS — Z23 Encounter for immunization: Secondary | ICD-10-CM

## 2020-08-10 DIAGNOSIS — Z8049 Family history of malignant neoplasm of other genital organs: Secondary | ICD-10-CM | POA: Diagnosis not present

## 2020-08-10 DIAGNOSIS — F329 Major depressive disorder, single episode, unspecified: Secondary | ICD-10-CM | POA: Diagnosis not present

## 2020-08-10 DIAGNOSIS — E039 Hypothyroidism, unspecified: Secondary | ICD-10-CM | POA: Diagnosis not present

## 2020-08-10 DIAGNOSIS — D6959 Other secondary thrombocytopenia: Secondary | ICD-10-CM | POA: Diagnosis not present

## 2020-08-10 DIAGNOSIS — R110 Nausea: Secondary | ICD-10-CM | POA: Diagnosis not present

## 2020-08-10 DIAGNOSIS — G620 Drug-induced polyneuropathy: Secondary | ICD-10-CM | POA: Diagnosis not present

## 2020-08-10 DIAGNOSIS — Z86711 Personal history of pulmonary embolism: Secondary | ICD-10-CM | POA: Diagnosis not present

## 2020-08-10 DIAGNOSIS — C5700 Malignant neoplasm of unspecified fallopian tube: Secondary | ICD-10-CM | POA: Diagnosis not present

## 2020-08-10 DIAGNOSIS — Z5111 Encounter for antineoplastic chemotherapy: Secondary | ICD-10-CM | POA: Diagnosis not present

## 2020-08-10 LAB — CMP (CANCER CENTER ONLY)
ALT: 17 U/L (ref 0–44)
AST: 17 U/L (ref 15–41)
Albumin: 3.5 g/dL (ref 3.5–5.0)
Alkaline Phosphatase: 86 U/L (ref 38–126)
Anion gap: 6 (ref 5–15)
BUN: 13 mg/dL (ref 8–23)
CO2: 24 mmol/L (ref 22–32)
Calcium: 8.9 mg/dL (ref 8.9–10.3)
Chloride: 107 mmol/L (ref 98–111)
Creatinine: 0.84 mg/dL (ref 0.44–1.00)
GFR, Est AFR Am: >60 mL/min (ref >60)
GFR, Estimated: >60 mL/min (ref >60)
Glucose, Bld: 100 mg/dL — ABNORMAL HIGH (ref 70–99)
Potassium: 4.1 mmol/L (ref 3.5–5.1)
Sodium: 137 mmol/L (ref 135–145)
Total Bilirubin: 0.3 mg/dL (ref 0.3–1.2)
Total Protein: 7 g/dL (ref 6.5–8.1)

## 2020-08-10 LAB — CBC WITH DIFFERENTIAL (CANCER CENTER ONLY)
Abs Immature Granulocytes: 0.01 10*3/uL (ref 0.00–0.07)
Basophils Absolute: 0 10*3/uL (ref 0.0–0.1)
Basophils Relative: 1 %
Eosinophils Absolute: 0 10*3/uL (ref 0.0–0.5)
Eosinophils Relative: 1 %
HCT: 28.6 % — ABNORMAL LOW (ref 36.0–46.0)
Hemoglobin: 9.4 g/dL — ABNORMAL LOW (ref 12.0–15.0)
Immature Granulocytes: 0 %
Lymphocytes Relative: 45 %
Lymphs Abs: 2 10*3/uL (ref 0.7–4.0)
MCH: 37.2 pg — ABNORMAL HIGH (ref 26.0–34.0)
MCHC: 32.9 g/dL (ref 30.0–36.0)
MCV: 113 fL — ABNORMAL HIGH (ref 80.0–100.0)
Monocytes Absolute: 0.5 10*3/uL (ref 0.1–1.0)
Monocytes Relative: 11 %
Neutro Abs: 1.8 10*3/uL (ref 1.7–7.7)
Neutrophils Relative %: 42 %
Platelet Count: 122 10*3/uL — ABNORMAL LOW (ref 150–400)
RBC: 2.53 MIL/uL — ABNORMAL LOW (ref 3.87–5.11)
RDW: 15.7 % — ABNORMAL HIGH (ref 11.5–15.5)
WBC Count: 4.2 10*3/uL (ref 4.0–10.5)
nRBC: 0 % (ref 0.0–0.2)

## 2020-08-10 MED ORDER — SODIUM CHLORIDE 0.9 % IV SOLN
150.0000 mg | Freq: Once | INTRAVENOUS | Status: AC
  Administered 2020-08-10: 150 mg via INTRAVENOUS
  Filled 2020-08-10: qty 150

## 2020-08-10 MED ORDER — DIPHENHYDRAMINE HCL 50 MG/ML IJ SOLN
50.0000 mg | Freq: Once | INTRAMUSCULAR | Status: AC
  Administered 2020-08-10: 50 mg via INTRAVENOUS

## 2020-08-10 MED ORDER — SODIUM CHLORIDE 0.9 % IV SOLN
438.4000 mg | Freq: Once | INTRAVENOUS | Status: AC
  Administered 2020-08-10: 440 mg via INTRAVENOUS
  Filled 2020-08-10: qty 44

## 2020-08-10 MED ORDER — DIPHENHYDRAMINE HCL 50 MG/ML IJ SOLN
INTRAMUSCULAR | Status: AC
  Filled 2020-08-10: qty 1

## 2020-08-10 MED ORDER — INFLUENZA VAC SPLIT QUAD 0.5 ML IM SUSY
PREFILLED_SYRINGE | INTRAMUSCULAR | Status: AC
  Filled 2020-08-10: qty 0.5

## 2020-08-10 MED ORDER — PALONOSETRON HCL INJECTION 0.25 MG/5ML
0.2500 mg | Freq: Once | INTRAVENOUS | Status: AC
  Administered 2020-08-10: 0.25 mg via INTRAVENOUS

## 2020-08-10 MED ORDER — SODIUM CHLORIDE 0.9 % IV SOLN
Freq: Once | INTRAVENOUS | Status: AC
  Filled 2020-08-10: qty 250

## 2020-08-10 MED ORDER — SODIUM CHLORIDE 0.9 % IV SOLN
10.0000 mg | Freq: Once | INTRAVENOUS | Status: AC
  Administered 2020-08-10: 10 mg via INTRAVENOUS
  Filled 2020-08-10: qty 10

## 2020-08-10 MED ORDER — SODIUM CHLORIDE 0.9% FLUSH
10.0000 mL | INTRAVENOUS | Status: DC | PRN
  Administered 2020-08-10: 10 mL
  Filled 2020-08-10: qty 10

## 2020-08-10 MED ORDER — INFLUENZA VAC SPLIT QUAD 0.5 ML IM SUSY
0.5000 mL | PREFILLED_SYRINGE | Freq: Once | INTRAMUSCULAR | Status: AC
  Administered 2020-08-10: 0.5 mL via INTRAMUSCULAR

## 2020-08-10 MED ORDER — FAMOTIDINE IN NACL 20-0.9 MG/50ML-% IV SOLN
20.0000 mg | Freq: Once | INTRAVENOUS | Status: AC
  Administered 2020-08-10: 20 mg via INTRAVENOUS

## 2020-08-10 MED ORDER — SODIUM CHLORIDE 0.9 % IV SOLN
140.0000 mg/m2 | Freq: Once | INTRAVENOUS | Status: AC
  Administered 2020-08-10: 258 mg via INTRAVENOUS
  Filled 2020-08-10: qty 43

## 2020-08-10 MED ORDER — FAMOTIDINE IN NACL 20-0.9 MG/50ML-% IV SOLN
INTRAVENOUS | Status: AC
  Filled 2020-08-10: qty 50

## 2020-08-10 MED ORDER — HEPARIN SOD (PORK) LOCK FLUSH 100 UNIT/ML IV SOLN
500.0000 [IU] | Freq: Once | INTRAVENOUS | Status: AC | PRN
  Administered 2020-08-10: 500 [IU]
  Filled 2020-08-10: qty 5

## 2020-08-10 MED ORDER — PALONOSETRON HCL INJECTION 0.25 MG/5ML
INTRAVENOUS | Status: AC
  Filled 2020-08-10: qty 5

## 2020-08-10 NOTE — Patient Instructions (Signed)
[facility] Discharge Instructions for Patients Receiving Chemotherapy  Today you received the following chemotherapy agents: taxol, carboplatin   To help prevent nausea and vomiting after your treatment, we encourage you to take your nausea medication as directed.    If you develop nausea and vomiting that is not controlled by your nausea medication, call the clinic.   BELOW ARE SYMPTOMS THAT SHOULD BE REPORTED IMMEDIATELY:  *FEVER GREATER THAN 100.5 F  *CHILLS WITH OR WITHOUT FEVER  NAUSEA AND VOMITING THAT IS NOT CONTROLLED WITH YOUR NAUSEA MEDICATION  *UNUSUAL SHORTNESS OF BREATH  *UNUSUAL BRUISING OR BLEEDING  TENDERNESS IN MOUTH AND THROAT WITH OR WITHOUT PRESENCE OF ULCERS  *URINARY PROBLEMS  *BOWEL PROBLEMS  UNUSUAL RASH Items with * indicate a potential emergency and should be followed up as soon as possible.  Feel free to call the clinic should you have any questions or concerns. The clinic phone number is [phone].  Please show the CHEMO ALERT CARD at check-in to the Emergency Department and triage nurse.   

## 2020-08-10 NOTE — Progress Notes (Signed)
[facility] OFFICE PROGRESS NOTE   Diagnosis: Fallopian tube carcinoma  INTERVAL HISTORY:   Ms. Sauseda completed a cycle of Taxol/carboplatin on 07/21/2020.  She had mild nausea following chemotherapy.  No symptom of allergic reaction.  No neuropathy symptoms.  She feels well.  No abdominal pain.  Objective:  Vital signs in last 24 hours:  Blood pressure 116/72, pulse 92, temperature 97.7 F (36.5 C), temperature source Tympanic, resp. rate 17, height 5' 3"  (1.6 m), weight 174 lb (78.9 kg), SpO2 100 %.    Resp: Lungs clear bilaterally Cardio: Regular rate and rhythm GI: No hepatosplenomegaly, no mass, no apparent ascites Vascular: No leg edema   Portacath/PICC-without erythema  Lab Results:  Lab Results  Component Value Date   WBC 4.2 08/10/2020   HGB 9.4 (L) 08/10/2020   HCT 28.6 (L) 08/10/2020   MCV 113.0 (H) 08/10/2020   PLT 122 (L) 08/10/2020   NEUTROABS 1.8 08/10/2020    CMP  Lab Results  Component Value Date   NA 137 08/10/2020   K 4.1 08/10/2020   CL 107 08/10/2020   CO2 24 08/10/2020   GLUCOSE 100 (H) 08/10/2020   BUN 13 08/10/2020   CREATININE 0.84 08/10/2020   CALCIUM 8.9 08/10/2020   PROT 7.0 08/10/2020   ALBUMIN 3.5 08/10/2020   AST 17 08/10/2020   ALT 17 08/10/2020   ALKPHOS 86 08/10/2020   BILITOT 0.3 08/10/2020   GFRNONAA >60 08/10/2020   GFRAA >60 08/10/2020    Lab Results  Component Value Date   CEA1 <1.00 06/27/2017    Medications: I have reviewed the patient's current medications.   Assessment/Plan: 1. Left abdomen/pelvic pain ? CT abdomen/pelvis 06/26/2017-soft tissue implants in the lower anterior peritoneum with implants at the paracolic gutters and left pelvic sidewall ? NegativeMYRIADhereditary cancer panel ? Elevated CA 125 ? CT biopsy of anterior omental mass 07/04/2017-Metastatic carcinoma consistent with a gynecologic primary ? 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 Cycle 1 adjuvant Taxol/carboplatin 08/05/2017 ? Cycle 2 adjuvant Taxol/carboplatin 08/26/2017 ? Cycle 3 adjuvant Taxol/carboplatin 09/17/2017-Taxol dose reduced secondary to neuropathy and bone pain ? Cycle 4 adjuvant Taxol/carboplatin 10/10/2017 ? Cycle 5 adjuvant Taxol/carboplatin 10/31/2017 ? Cycle 6 adjuvant Taxol/carboplatin 11/21/2017 ? CT abdomen/pelvis 08/08/2019-narrowing of rectosigmoid colon with eccentric serosal implant, 10 mm lymph node at the superior rectal vein, left upper quadrant soft tissue implant, 9 mm precaval node, 15 mm left internal iliac node, no ascites ? Cycle 1 Taxol/carboplatin 08/31/2019 ? Cycle 2 Taxol/carboplatin/Avastin 09/21/2019 ? Cycle 3 Taxol/Carboplatin 10/12/2019 (Avastin discontinued) ? Cycle 4 Taxol/carboplatin November 02, 2019 ? CT abdomen/pelvis 11/17/2019-decrease in size of previously prominent pericaval, left internal iliac, and superior rectal vein nodes, resolved rectosigmoid wall thickening ? Cycle 5 Taxol/carboplatin 11/22/2019 ? Cycle 6 Taxol/carboplatin 12/13/2019 ? Olaparib2/17/2021 ? CT abdomen/pelvis 07/10/2020-enlarging soft tissue at the distal left ureter, the ureter is not dilated, peritoneal implant adjacent to the splenic flexure, enlarged left superior rectal node, lymph nodes along the course of the distal IMV have enlarged, eccentric thickening of the sigmoid colon not seen  ? Olaraparib discontinued 07/12/2020 ? Cycle 1 Taxol/carboplatin 07/21/2020 ? Cycle 2 Taxol/carboplatin 08/10/2020 (carboplatin dose reduced secondary to thrombocytopenia)  2. Depression  3.  Hypothyroid  4. Family history of uterine cancer-paternal grandmother  18.Acute onset dyspnea/pleuritic left-sided chest pain 10/02/2019  CT chest 10/04/2019-left lower lobe pulmonary emboli, multifocal pneumonia lower lobes  On Xarelto, status post course of Levaquin.       Disposition: Ms. Spicer appears stable.  She tolerated the first cycle of salvage Taxol/carboplatin well.  She will complete cycle 2 today.  The platelets are mildly low and she is maintained on anticoagulation therapy.  I dose reduce the carboplatin with this cycle of chemotherapy.  Ms. Kloosterman will return for an office visit in the next cycle of chemotherapy on 08/29/2020.  We will follow up on the CA-125 from today.  [name], MD  08/10/2020  9:42 AM

## 2020-08-11 ENCOUNTER — Encounter: Payer: Self-pay | Admitting: Oncology

## 2020-08-11 LAB — CA 125: Cancer Antigen (CA) 125: 41.7 U/mL — ABNORMAL HIGH (ref 0.0–38.1)

## 2020-08-14 ENCOUNTER — Telehealth: Payer: Self-pay

## 2020-08-14 ENCOUNTER — Telehealth: Payer: Self-pay | Admitting: Oncology

## 2020-08-14 NOTE — Telephone Encounter (Signed)
Scheduled appointments per 9/23 los. Was unable to put patient on 10/12 as per los so moved patient to following week with approval from [facility]. Spoke to patient who is aware of upcoming appointment times and date.

## 2020-08-14 NOTE — Telephone Encounter (Signed)
Spoke with Mrs. Shanholtzer about her care here at the [facility]  She is now scheduled for 10/19 and she requests to stay on a Tuesday schedule due to her family being able to help out these days.  She wanted to express that she does not have anything against seeing Ned Card but she feels more comfortable with Dr. Benay Spice and only wants to see him.  Her main concern was having her infusion scheduled before anyone spoke with her and I agreed with her about this. I told her I would speak with scheduling about this as they should not schedule something without speaking to the patient first.  She was very pleasant on the phone and I listened to her concerns.  Gardiner Rhyme, RN

## 2020-08-28 ENCOUNTER — Ambulatory Visit: Payer: BC Managed Care – PPO | Admitting: Nurse Practitioner

## 2020-08-28 ENCOUNTER — Ambulatory Visit: Payer: BC Managed Care – PPO

## 2020-08-28 ENCOUNTER — Other Ambulatory Visit: Payer: BC Managed Care – PPO

## 2020-08-29 ENCOUNTER — Other Ambulatory Visit: Payer: BC Managed Care – PPO

## 2020-08-29 ENCOUNTER — Ambulatory Visit: Payer: BC Managed Care – PPO | Admitting: Oncology

## 2020-09-02 ENCOUNTER — Other Ambulatory Visit: Payer: Self-pay | Admitting: Oncology

## 2020-09-05 ENCOUNTER — Inpatient Hospital Stay (HOSPITAL_BASED_OUTPATIENT_CLINIC_OR_DEPARTMENT_OTHER): Payer: BC Managed Care – PPO | Admitting: Oncology

## 2020-09-05 ENCOUNTER — Inpatient Hospital Stay: Payer: BC Managed Care – PPO | Attending: Oncology

## 2020-09-05 ENCOUNTER — Other Ambulatory Visit: Payer: BC Managed Care – PPO

## 2020-09-05 ENCOUNTER — Inpatient Hospital Stay: Payer: BC Managed Care – PPO

## 2020-09-05 ENCOUNTER — Other Ambulatory Visit: Payer: Self-pay

## 2020-09-05 VITALS — BP 134/74 | HR 96 | Temp 97.8°F | Resp 18 | Ht 63.0 in | Wt 174.4 lb

## 2020-09-05 DIAGNOSIS — C5700 Malignant neoplasm of unspecified fallopian tube: Secondary | ICD-10-CM | POA: Insufficient documentation

## 2020-09-05 DIAGNOSIS — Z5111 Encounter for antineoplastic chemotherapy: Secondary | ICD-10-CM | POA: Insufficient documentation

## 2020-09-05 DIAGNOSIS — Z86711 Personal history of pulmonary embolism: Secondary | ICD-10-CM | POA: Diagnosis not present

## 2020-09-05 DIAGNOSIS — Z79899 Other long term (current) drug therapy: Secondary | ICD-10-CM | POA: Diagnosis not present

## 2020-09-05 DIAGNOSIS — C5702 Malignant neoplasm of left fallopian tube: Secondary | ICD-10-CM

## 2020-09-05 DIAGNOSIS — Z7901 Long term (current) use of anticoagulants: Secondary | ICD-10-CM | POA: Insufficient documentation

## 2020-09-05 DIAGNOSIS — E039 Hypothyroidism, unspecified: Secondary | ICD-10-CM | POA: Diagnosis not present

## 2020-09-05 DIAGNOSIS — G629 Polyneuropathy, unspecified: Secondary | ICD-10-CM | POA: Insufficient documentation

## 2020-09-05 DIAGNOSIS — D696 Thrombocytopenia, unspecified: Secondary | ICD-10-CM | POA: Insufficient documentation

## 2020-09-05 DIAGNOSIS — R5381 Other malaise: Secondary | ICD-10-CM | POA: Diagnosis not present

## 2020-09-05 DIAGNOSIS — Z95828 Presence of other vascular implants and grafts: Secondary | ICD-10-CM

## 2020-09-05 DIAGNOSIS — F32A Depression, unspecified: Secondary | ICD-10-CM | POA: Insufficient documentation

## 2020-09-05 LAB — CBC WITH DIFFERENTIAL (CANCER CENTER ONLY)
Abs Immature Granulocytes: 0.02 10*3/uL (ref 0.00–0.07)
Basophils Absolute: 0 10*3/uL (ref 0.0–0.1)
Basophils Relative: 0 %
Eosinophils Absolute: 0 10*3/uL (ref 0.0–0.5)
Eosinophils Relative: 0 %
HCT: 26.5 % — ABNORMAL LOW (ref 36.0–46.0)
Hemoglobin: 8.8 g/dL — ABNORMAL LOW (ref 12.0–15.0)
Immature Granulocytes: 0 %
Lymphocytes Relative: 39 %
Lymphs Abs: 1.8 10*3/uL (ref 0.7–4.0)
MCH: 37.1 pg — ABNORMAL HIGH (ref 26.0–34.0)
MCHC: 33.2 g/dL (ref 30.0–36.0)
MCV: 111.8 fL — ABNORMAL HIGH (ref 80.0–100.0)
Monocytes Absolute: 0.5 10*3/uL (ref 0.1–1.0)
Monocytes Relative: 12 %
Neutro Abs: 2.2 10*3/uL (ref 1.7–7.7)
Neutrophils Relative %: 49 %
Platelet Count: 139 10*3/uL — ABNORMAL LOW (ref 150–400)
RBC: 2.37 MIL/uL — ABNORMAL LOW (ref 3.87–5.11)
RDW: 15.8 % — ABNORMAL HIGH (ref 11.5–15.5)
WBC Count: 4.5 10*3/uL (ref 4.0–10.5)
nRBC: 0 % (ref 0.0–0.2)

## 2020-09-05 LAB — CMP (CANCER CENTER ONLY)
ALT: 15 U/L (ref 0–44)
AST: 15 U/L (ref 15–41)
Albumin: 3.5 g/dL (ref 3.5–5.0)
Alkaline Phosphatase: 82 U/L (ref 38–126)
Anion gap: 5 (ref 5–15)
BUN: 14 mg/dL (ref 8–23)
CO2: 26 mmol/L (ref 22–32)
Calcium: 9.3 mg/dL (ref 8.9–10.3)
Chloride: 107 mmol/L (ref 98–111)
Creatinine: 0.8 mg/dL (ref 0.44–1.00)
GFR, Estimated: >60 mL/min (ref >60)
Glucose, Bld: 108 mg/dL — ABNORMAL HIGH (ref 70–99)
Potassium: 4 mmol/L (ref 3.5–5.1)
Sodium: 138 mmol/L (ref 135–145)
Total Bilirubin: <0.2 mg/dL — ABNORMAL LOW (ref 0.3–1.2)
Total Protein: 6.9 g/dL (ref 6.5–8.1)

## 2020-09-05 MED ORDER — SODIUM CHLORIDE 0.9 % IV SOLN
440.0000 mg | Freq: Once | INTRAVENOUS | Status: AC
  Administered 2020-09-05: 440 mg via INTRAVENOUS
  Filled 2020-09-05: qty 44

## 2020-09-05 MED ORDER — FAMOTIDINE IN NACL 20-0.9 MG/50ML-% IV SOLN
20.0000 mg | Freq: Once | INTRAVENOUS | Status: AC
  Administered 2020-09-05: 20 mg via INTRAVENOUS

## 2020-09-05 MED ORDER — PALONOSETRON HCL INJECTION 0.25 MG/5ML
INTRAVENOUS | Status: AC
  Filled 2020-09-05: qty 5

## 2020-09-05 MED ORDER — SODIUM CHLORIDE 0.9% FLUSH
10.0000 mL | INTRAVENOUS | Status: DC | PRN
  Administered 2020-09-05: 10 mL
  Filled 2020-09-05: qty 10

## 2020-09-05 MED ORDER — PALONOSETRON HCL INJECTION 0.25 MG/5ML
0.2500 mg | Freq: Once | INTRAVENOUS | Status: AC
  Administered 2020-09-05: 0.25 mg via INTRAVENOUS

## 2020-09-05 MED ORDER — DIPHENHYDRAMINE HCL 50 MG/ML IJ SOLN
INTRAMUSCULAR | Status: AC
  Filled 2020-09-05: qty 1

## 2020-09-05 MED ORDER — SODIUM CHLORIDE 0.9 % IV SOLN
10.0000 mg | Freq: Once | INTRAVENOUS | Status: AC
  Administered 2020-09-05: 10 mg via INTRAVENOUS
  Filled 2020-09-05: qty 10

## 2020-09-05 MED ORDER — SODIUM CHLORIDE 0.9 % IV SOLN
150.0000 mg | Freq: Once | INTRAVENOUS | Status: AC
  Administered 2020-09-05: 150 mg via INTRAVENOUS
  Filled 2020-09-05: qty 150

## 2020-09-05 MED ORDER — FAMOTIDINE IN NACL 20-0.9 MG/50ML-% IV SOLN
INTRAVENOUS | Status: AC
  Filled 2020-09-05: qty 50

## 2020-09-05 MED ORDER — SODIUM CHLORIDE 0.9 % IV SOLN
Freq: Once | INTRAVENOUS | Status: AC
  Filled 2020-09-05: qty 250

## 2020-09-05 MED ORDER — SODIUM CHLORIDE 0.9 % IV SOLN
140.0000 mg/m2 | Freq: Once | INTRAVENOUS | Status: AC
  Administered 2020-09-05: 258 mg via INTRAVENOUS
  Filled 2020-09-05: qty 43

## 2020-09-05 MED ORDER — DIPHENHYDRAMINE HCL 50 MG/ML IJ SOLN
50.0000 mg | Freq: Once | INTRAMUSCULAR | Status: AC
  Administered 2020-09-05: 50 mg via INTRAVENOUS

## 2020-09-05 MED ORDER — HEPARIN SOD (PORK) LOCK FLUSH 100 UNIT/ML IV SOLN
500.0000 [IU] | Freq: Once | INTRAVENOUS | Status: AC | PRN
  Administered 2020-09-05: 500 [IU]
  Filled 2020-09-05: qty 5

## 2020-09-05 NOTE — Patient Instructions (Signed)
[facility] Discharge Instructions for Patients Receiving Chemotherapy  Today you received the following chemotherapy agents Paclitaxel (TAXOL) & Carboplatin (PARAPLATIN).  To help prevent nausea and vomiting after your treatment, we encourage you to take your nausea medication as prescribed.  If you develop nausea and vomiting that is not controlled by your nausea medication, call the clinic.   BELOW ARE SYMPTOMS THAT SHOULD BE REPORTED IMMEDIATELY:  *FEVER GREATER THAN 100.5 F  *CHILLS WITH OR WITHOUT FEVER  NAUSEA AND VOMITING THAT IS NOT CONTROLLED WITH YOUR NAUSEA MEDICATION  *UNUSUAL SHORTNESS OF BREATH  *UNUSUAL BRUISING OR BLEEDING  TENDERNESS IN MOUTH AND THROAT WITH OR WITHOUT PRESENCE OF ULCERS  *URINARY PROBLEMS  *BOWEL PROBLEMS  UNUSUAL RASH Items with * indicate a potential emergency and should be followed up as soon as possible.  Feel free to call the clinic should you have any questions or concerns. The clinic phone number is [phone].  Please show the CHEMO ALERT CARD at check-in to the Emergency Department and triage nurse.   

## 2020-09-05 NOTE — Progress Notes (Signed)
[facility] OFFICE PROGRESS NOTE   Diagnosis: Fallopian tube carcinoma  INTERVAL HISTORY:   Anna [name] completed another cycle of Taxol/carboplatin on 08/10/2020.  No nausea/vomiting or neuropathy symptoms.  She has mild malaise.  No other complaint.  No bleeding.  She is having bowel movements.  Objective:  Vital signs in last 24 hours:  Blood pressure 134/74, pulse 96, temperature 97.8 F (36.6 C), resp. rate 18, height 5' 3"  (1.6 m), weight 174 lb 6.4 oz (79.1 kg), SpO2 100 %.    HEENT: No thrush or ulcers Resp: Lungs clear bilaterally Cardio: Regular rate and rhythm GI: No apparent ascites, no mass, nontender Vascular: No leg edema     Portacath/PICC-without erythema  Lab Results:  Lab Results  Component Value Date   WBC 4.5 09/05/2020   HGB 8.8 (L) 09/05/2020   HCT 26.5 (L) 09/05/2020   MCV 111.8 (H) 09/05/2020   PLT 139 (L) 09/05/2020   NEUTROABS 2.2 09/05/2020    CMP  Lab Results  Component Value Date   NA 137 08/10/2020   K 4.1 08/10/2020   CL 107 08/10/2020   CO2 24 08/10/2020   GLUCOSE 100 (H) 08/10/2020   BUN 13 08/10/2020   CREATININE 0.84 08/10/2020   CALCIUM 8.9 08/10/2020   PROT 7.0 08/10/2020   ALBUMIN 3.5 08/10/2020   AST 17 08/10/2020   ALT 17 08/10/2020   ALKPHOS 86 08/10/2020   BILITOT 0.3 08/10/2020   GFRNONAA >60 08/10/2020   GFRAA >60 08/10/2020   CA-125 on 08/10/2020: 41.7  Medications: I have reviewed the patient's current medications.   Assessment/Plan: 1. Left abdomen/pelvic pain ? CT abdomen/pelvis 06/26/2017-soft tissue implants in the lower anterior peritoneum with implants at the paracolic gutters and left pelvic sidewall ? NegativeMYRIADhereditary cancer panel ? Elevated CA 125 ? CT biopsy of anterior omental mass 07/04/2017-Metastatic carcinoma consistent with a gynecologic primary ? 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 Cycle 1 adjuvant Taxol/carboplatin 08/05/2017 ? Cycle 2 adjuvant Taxol/carboplatin 08/26/2017 ? Cycle 3 adjuvant Taxol/carboplatin 09/17/2017-Taxol dose reduced secondary to neuropathy and bone pain ? Cycle 4 adjuvant Taxol/carboplatin 10/10/2017 ? Cycle 5 adjuvant Taxol/carboplatin 10/31/2017 ? Cycle 6 adjuvant Taxol/carboplatin 11/21/2017 ? CT abdomen/pelvis 08/08/2019-narrowing of rectosigmoid colon with eccentric serosal implant, 10 mm lymph node at the superior rectal vein, left upper quadrant soft tissue implant, 9 mm precaval node, 15 mm left internal iliac node, no ascites ? Cycle 1 Taxol/carboplatin 08/31/2019 ? Cycle 2 Taxol/carboplatin/Avastin 09/21/2019 ? Cycle 3 Taxol/Carboplatin 10/12/2019 (Avastin discontinued) ? Cycle 4 Taxol/carboplatin November 02, 2019 ? CT abdomen/pelvis 11/17/2019-decrease in size of previously prominent pericaval, left internal iliac, and superior rectal vein nodes, resolved rectosigmoid wall thickening ? Cycle 5 Taxol/carboplatin 11/22/2019 ? Cycle 6 Taxol/carboplatin 12/13/2019 ? Olaparib2/17/2021 ? CT abdomen/pelvis 07/10/2020-enlarging soft tissue at the distal left ureter, the ureter is not dilated, peritoneal implant adjacent to the splenic flexure, enlarged left superior rectal node, lymph nodes along the course of the distal IMV have enlarged, eccentric thickening of the sigmoid colon not seen  ? Olaraparib discontinued 07/12/2020 ? Cycle 1 Taxol/carboplatin 07/21/2020 ? Cycle 2 Taxol/carboplatin 08/10/2020 (carboplatin dose reduced secondary to thrombocytopenia) ? Cycle 3 Taxol/carboplatin 09/05/2020  2. Depression  3. Hypothyroid  4. Family  history of uterine cancer-paternal grandmother  57.Acute onset dyspnea/pleuritic left-sided chest pain 10/02/2019  CT chest 10/04/2019-left lower lobe pulmonary emboli, multifocal pneumonia lower lobes  On Xarelto, status post course of Levaquin.    Disposition: Anna [name] appears stable.  She is tolerating the Taxol/carboplatin well.  The CA-125 is lower when she was here on 08/10/2020.  She will complete cycle 3 of salvage Taxol/carboplatin today.  Anna [name] will return for an office visit and chemotherapy in 3 weeks.  [name], MD  09/05/2020  9:11 AM

## 2020-09-06 ENCOUNTER — Telehealth: Payer: Self-pay | Admitting: Oncology

## 2020-09-06 LAB — CA 125: Cancer Antigen (CA) 125: 35.5 U/mL (ref 0.0–38.1)

## 2020-09-06 NOTE — Telephone Encounter (Signed)
Scheduled appointments per 10/18 los. Spoke to patient who is aware of appointments dates and times.

## 2020-09-20 ENCOUNTER — Encounter: Payer: Self-pay | Admitting: Internal Medicine

## 2020-09-21 ENCOUNTER — Other Ambulatory Visit: Payer: Self-pay | Admitting: Oncology

## 2020-09-21 MED FILL — XARELTO 20 MG TABLET: 20 | 90 days supply | Qty: 90 | Fill #0

## 2020-09-22 DIAGNOSIS — Z9842 Cataract extraction status, left eye: Secondary | ICD-10-CM | POA: Diagnosis not present

## 2020-09-22 DIAGNOSIS — Z9889 Other specified postprocedural states: Secondary | ICD-10-CM | POA: Diagnosis not present

## 2020-09-22 DIAGNOSIS — Z961 Presence of intraocular lens: Secondary | ICD-10-CM | POA: Diagnosis not present

## 2020-09-24 ENCOUNTER — Other Ambulatory Visit: Payer: Self-pay | Admitting: Oncology

## 2020-09-25 ENCOUNTER — Ambulatory Visit: Payer: BC Managed Care – PPO | Admitting: Internal Medicine

## 2020-09-26 ENCOUNTER — Inpatient Hospital Stay: Payer: BC Managed Care – PPO

## 2020-09-26 ENCOUNTER — Inpatient Hospital Stay (HOSPITAL_BASED_OUTPATIENT_CLINIC_OR_DEPARTMENT_OTHER): Payer: BC Managed Care – PPO | Admitting: Oncology

## 2020-09-26 ENCOUNTER — Other Ambulatory Visit: Payer: Self-pay

## 2020-09-26 ENCOUNTER — Inpatient Hospital Stay: Payer: BC Managed Care – PPO | Attending: Oncology

## 2020-09-26 VITALS — BP 105/68 | HR 88 | Temp 98.3°F | Resp 17 | Ht 63.0 in | Wt 172.8 lb

## 2020-09-26 DIAGNOSIS — C5702 Malignant neoplasm of left fallopian tube: Secondary | ICD-10-CM | POA: Diagnosis not present

## 2020-09-26 DIAGNOSIS — C5700 Malignant neoplasm of unspecified fallopian tube: Secondary | ICD-10-CM | POA: Diagnosis not present

## 2020-09-26 DIAGNOSIS — E039 Hypothyroidism, unspecified: Secondary | ICD-10-CM | POA: Insufficient documentation

## 2020-09-26 DIAGNOSIS — Z5111 Encounter for antineoplastic chemotherapy: Secondary | ICD-10-CM | POA: Insufficient documentation

## 2020-09-26 DIAGNOSIS — Z95828 Presence of other vascular implants and grafts: Secondary | ICD-10-CM

## 2020-09-26 DIAGNOSIS — Z86711 Personal history of pulmonary embolism: Secondary | ICD-10-CM | POA: Insufficient documentation

## 2020-09-26 DIAGNOSIS — F329 Major depressive disorder, single episode, unspecified: Secondary | ICD-10-CM | POA: Diagnosis not present

## 2020-09-26 LAB — CBC WITH DIFFERENTIAL (CANCER CENTER ONLY)
Abs Immature Granulocytes: 0.02 10*3/uL (ref 0.00–0.07)
Basophils Absolute: 0 10*3/uL (ref 0.0–0.1)
Basophils Relative: 1 %
Eosinophils Absolute: 0 10*3/uL (ref 0.0–0.5)
Eosinophils Relative: 1 %
HCT: 27 % — ABNORMAL LOW (ref 36.0–46.0)
Hemoglobin: 8.9 g/dL — ABNORMAL LOW (ref 12.0–15.0)
Immature Granulocytes: 1 %
Lymphocytes Relative: 41 %
Lymphs Abs: 1.8 10*3/uL (ref 0.7–4.0)
MCH: 36.6 pg — ABNORMAL HIGH (ref 26.0–34.0)
MCHC: 33 g/dL (ref 30.0–36.0)
MCV: 111.1 fL — ABNORMAL HIGH (ref 80.0–100.0)
Monocytes Absolute: 0.6 10*3/uL (ref 0.1–1.0)
Monocytes Relative: 15 %
Neutro Abs: 1.8 10*3/uL (ref 1.7–7.7)
Neutrophils Relative %: 41 %
Platelet Count: 132 10*3/uL — ABNORMAL LOW (ref 150–400)
RBC: 2.43 MIL/uL — ABNORMAL LOW (ref 3.87–5.11)
RDW: 15.7 % — ABNORMAL HIGH (ref 11.5–15.5)
WBC Count: 4.3 10*3/uL (ref 4.0–10.5)
nRBC: 0 % (ref 0.0–0.2)

## 2020-09-26 LAB — CMP (CANCER CENTER ONLY)
ALT: 20 U/L (ref 0–44)
AST: 18 U/L (ref 15–41)
Albumin: 3.7 g/dL (ref 3.5–5.0)
Alkaline Phosphatase: 84 U/L (ref 38–126)
Anion gap: 12 (ref 5–15)
BUN: 18 mg/dL (ref 8–23)
CO2: 22 mmol/L (ref 22–32)
Calcium: 9.4 mg/dL (ref 8.9–10.3)
Chloride: 105 mmol/L (ref 98–111)
Creatinine: 0.93 mg/dL (ref 0.44–1.00)
GFR, Estimated: >60 mL/min (ref >60)
Glucose, Bld: 115 mg/dL — ABNORMAL HIGH (ref 70–99)
Potassium: 3.7 mmol/L (ref 3.5–5.1)
Sodium: 139 mmol/L (ref 135–145)
Total Bilirubin: 0.4 mg/dL (ref 0.3–1.2)
Total Protein: 7.1 g/dL (ref 6.5–8.1)

## 2020-09-26 MED ORDER — FAMOTIDINE IN NACL 20-0.9 MG/50ML-% IV SOLN
INTRAVENOUS | Status: AC
  Filled 2020-09-26: qty 50

## 2020-09-26 MED ORDER — HEPARIN SOD (PORK) LOCK FLUSH 100 UNIT/ML IV SOLN
500.0000 [IU] | Freq: Once | INTRAVENOUS | Status: AC | PRN
  Administered 2020-09-26: 500 [IU]
  Filled 2020-09-26: qty 5

## 2020-09-26 MED ORDER — SODIUM CHLORIDE 0.9 % IV SOLN
150.0000 mg | Freq: Once | INTRAVENOUS | Status: AC
  Administered 2020-09-26: 150 mg via INTRAVENOUS
  Filled 2020-09-26: qty 150

## 2020-09-26 MED ORDER — FAMOTIDINE IN NACL 20-0.9 MG/50ML-% IV SOLN
20.0000 mg | Freq: Once | INTRAVENOUS | Status: AC
  Administered 2020-09-26: 20 mg via INTRAVENOUS

## 2020-09-26 MED ORDER — SODIUM CHLORIDE 0.9 % IV SOLN
Freq: Once | INTRAVENOUS | Status: AC
  Filled 2020-09-26: qty 250

## 2020-09-26 MED ORDER — SODIUM CHLORIDE 0.9 % IV SOLN
140.0000 mg/m2 | Freq: Once | INTRAVENOUS | Status: AC
  Administered 2020-09-26: 258 mg via INTRAVENOUS
  Filled 2020-09-26: qty 43

## 2020-09-26 MED ORDER — SODIUM CHLORIDE 0.9 % IV SOLN
440.0000 mg | Freq: Once | INTRAVENOUS | Status: AC
  Administered 2020-09-26: 440 mg via INTRAVENOUS
  Filled 2020-09-26: qty 44

## 2020-09-26 MED ORDER — SODIUM CHLORIDE 0.9 % IV SOLN
10.0000 mg | Freq: Once | INTRAVENOUS | Status: AC
  Administered 2020-09-26: 10 mg via INTRAVENOUS
  Filled 2020-09-26: qty 10

## 2020-09-26 MED ORDER — DIPHENHYDRAMINE HCL 50 MG/ML IJ SOLN
INTRAMUSCULAR | Status: AC
  Filled 2020-09-26: qty 1

## 2020-09-26 MED ORDER — SODIUM CHLORIDE 0.9% FLUSH
10.0000 mL | INTRAVENOUS | Status: DC | PRN
  Administered 2020-09-26: 10 mL
  Filled 2020-09-26: qty 10

## 2020-09-26 MED ORDER — PALONOSETRON HCL INJECTION 0.25 MG/5ML
0.2500 mg | Freq: Once | INTRAVENOUS | Status: AC
  Administered 2020-09-26: 0.25 mg via INTRAVENOUS

## 2020-09-26 MED ORDER — DIPHENHYDRAMINE HCL 50 MG/ML IJ SOLN
50.0000 mg | Freq: Once | INTRAMUSCULAR | Status: AC
  Administered 2020-09-26: 50 mg via INTRAVENOUS

## 2020-09-26 MED ORDER — PALONOSETRON HCL INJECTION 0.25 MG/5ML
INTRAVENOUS | Status: AC
  Filled 2020-09-26: qty 5

## 2020-09-26 NOTE — Patient Instructions (Signed)
[facility] Discharge Instructions for Patients Receiving Chemotherapy  Today you received the following chemotherapy agents Taxol, Carboplatin  To help prevent nausea and vomiting after your treatment, we encourage you to take your nausea medication as directed  If you develop nausea and vomiting that is not controlled by your nausea medication, call the clinic.   BELOW ARE SYMPTOMS THAT SHOULD BE REPORTED IMMEDIATELY:  *FEVER GREATER THAN 100.5 F  *CHILLS WITH OR WITHOUT FEVER  NAUSEA AND VOMITING THAT IS NOT CONTROLLED WITH YOUR NAUSEA MEDICATION  *UNUSUAL SHORTNESS OF BREATH  *UNUSUAL BRUISING OR BLEEDING  TENDERNESS IN MOUTH AND THROAT WITH OR WITHOUT PRESENCE OF ULCERS  *URINARY PROBLEMS  *BOWEL PROBLEMS  UNUSUAL RASH Items with * indicate a potential emergency and should be followed up as soon as possible.  Feel free to call the clinic should you have any questions or concerns. The clinic phone number is [phone].  Please show the CHEMO ALERT CARD at check-in to the Emergency Department and triage nurse.   

## 2020-09-26 NOTE — Progress Notes (Signed)
[facility] OFFICE PROGRESS NOTE   Diagnosis: Fallopian tube carcinoma  INTERVAL HISTORY:   Anna [name] returns as scheduled.  She completed another cycle of Taxol/carboplatin on 09/05/2020.  She had joint pain beginning on day 2 and lasting for several days.  She reports numbness in the extremities beginning approximately 4 days following chemotherapy and lasting for a few days.  No complaint today.  Objective:  Vital signs in last 24 hours:  Blood pressure 105/68, pulse 88, temperature 98.3 F (36.8 C), temperature source Tympanic, resp. rate 17, height 5' 3" (1.6 m), weight 172 lb 12.8 oz (78.4 kg), SpO2 100 %.    HEENT: No thrush or ulcers Resp: Lungs clear bilaterally Cardio: Regular rate and rhythm GI: No mass, no apparent ascites, no hepatosplenomegaly Vascular: No leg edema    Portacath/PICC-without erythema  Lab Results:  Lab Results  Component Value Date   WBC 4.3 09/26/2020   HGB 8.9 (L) 09/26/2020   HCT 27.0 (L) 09/26/2020   MCV 111.1 (H) 09/26/2020   PLT 132 (L) 09/26/2020   NEUTROABS 1.8 09/26/2020    CMP  Lab Results  Component Value Date   NA 139 09/26/2020   K 3.7 09/26/2020   CL 105 09/26/2020   CO2 22 09/26/2020   GLUCOSE 115 (H) 09/26/2020   BUN 18 09/26/2020   CREATININE 0.93 09/26/2020   CALCIUM 9.4 09/26/2020   PROT 7.1 09/26/2020   ALBUMIN 3.7 09/26/2020   AST 18 09/26/2020   ALT 20 09/26/2020   ALKPHOS 84 09/26/2020   BILITOT 0.4 09/26/2020   GFRNONAA >60 09/26/2020   GFRAA >60 08/10/2020    Lab Results  Component Value Date   CEA1 <1.00 06/27/2017     Medications: I have reviewed the patient's current medications.   Assessment/Plan: 1. Left abdomen/pelvic pain ? CT abdomen/pelvis 06/26/2017-soft tissue implants in the lower anterior peritoneum with implants at the paracolic gutters and left pelvic sidewall ? NegativeMYRIADhereditary cancer panel ? Elevated CA 125 ? CT biopsy of anterior omental mass  07/04/2017-Metastatic carcinoma consistent with a gynecologic primary ? 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 Cycle 1 adjuvant Taxol/carboplatin 08/05/2017 ? Cycle 2 adjuvant Taxol/carboplatin 08/26/2017 ? Cycle 3 adjuvant Taxol/carboplatin 09/17/2017-Taxol dose reduced secondary to neuropathy and bone pain ? Cycle 4 adjuvant Taxol/carboplatin 10/10/2017 ? Cycle 5 adjuvant Taxol/carboplatin 10/31/2017 ? Cycle 6 adjuvant Taxol/carboplatin 11/21/2017 ? CT abdomen/pelvis 08/08/2019-narrowing of rectosigmoid colon with eccentric serosal implant, 10 mm lymph node at the superior rectal vein, left upper quadrant soft tissue implant, 9 mm precaval node, 15 mm left internal iliac node, no ascites ? Cycle 1 Taxol/carboplatin 08/31/2019 ? Cycle 2 Taxol/carboplatin/Avastin 09/21/2019 ? Cycle 3 Taxol/Carboplatin 10/12/2019 (Avastin discontinued) ? Cycle 4 Taxol/carboplatin November 02, 2019 ? CT abdomen/pelvis 11/17/2019-decrease in size of previously prominent pericaval, left internal iliac, and superior rectal vein nodes, resolved rectosigmoid wall thickening ? Cycle 5 Taxol/carboplatin 11/22/2019 ? Cycle 6 Taxol/carboplatin 12/13/2019 ? Olaparib2/17/2021 ? CT abdomen/pelvis 07/10/2020-enlarging soft tissue at the distal left ureter, the ureter is not dilated, peritoneal implant adjacent to the splenic flexure, enlarged left superior rectal node, lymph nodes along the course of the distal IMV have enlarged, eccentric thickening of the sigmoid colon not seen  ?  Olaraparib discontinued 07/12/2020 ? Cycle 1 Taxol/carboplatin 07/21/2020 ? Cycle  2 Taxol/carboplatin 08/10/2020 (carboplatin dose reduced secondary to thrombocytopenia) ? Cycle 3 Taxol/carboplatin 09/05/2020 ? Cycle 4 Taxol/carboplatin 09/26/2020  2. Depression  3. Hypothyroid  4. Family history of uterine cancer-paternal grandmother  5.Acute onset dyspnea/pleuritic left-sided chest pain 10/02/2019  CT chest 10/04/2019-left lower lobe pulmonary emboli, multifocal pneumonia lower lobes  On Xarelto, status post course of Levaquin.     Disposition: Anna [name] appears stable.  She has completed 3 cycles of salvage therapy with Taxol/carboplatin.  She will complete cycle 4 today.  We will follow up on the CA-125 from today.  She will return for an office visit and chemotherapy in 3 weeks.  She will be scheduled for a restaging CT after cycle 5.  Anna [name] will discuss the indication for continuing antihypertensive therapy with Dr. Scott.  Gary Sherrill, MD  09/26/2020  9:17 AM   

## 2020-09-27 ENCOUNTER — Telehealth: Payer: Self-pay | Admitting: Oncology

## 2020-09-27 ENCOUNTER — Telehealth: Payer: Self-pay | Admitting: *Deleted

## 2020-09-27 LAB — CA 125: Cancer Antigen (CA) 125: 43.4 U/mL — ABNORMAL HIGH (ref 0.0–38.1)

## 2020-09-27 NOTE — Telephone Encounter (Signed)
Asking if there will be any change in treatment plan with the CA125 has increased from 35 to 43.4? Per Dr. Benay Spice: increase is not significant. Continue current treatment plan.

## 2020-09-27 NOTE — Telephone Encounter (Signed)
Scheduled appointments per 11/9 los. Spoke to patient who is aware of appointments dates and times.

## 2020-10-03 ENCOUNTER — Encounter: Payer: Self-pay | Admitting: Internal Medicine

## 2020-10-15 ENCOUNTER — Other Ambulatory Visit: Payer: Self-pay | Admitting: Oncology

## 2020-10-16 MED FILL — Dexamethasone Sodium Phosphate Inj 100 MG/10ML: INTRAMUSCULAR | Qty: 1 | Status: AC

## 2020-10-16 MED FILL — Fosaprepitant Dimeglumine For IV Infusion 150 MG (Base Eq): INTRAVENOUS | Qty: 5 | Status: AC

## 2020-10-17 ENCOUNTER — Inpatient Hospital Stay: Payer: BC Managed Care – PPO

## 2020-10-17 ENCOUNTER — Other Ambulatory Visit: Payer: Self-pay

## 2020-10-17 ENCOUNTER — Inpatient Hospital Stay (HOSPITAL_BASED_OUTPATIENT_CLINIC_OR_DEPARTMENT_OTHER): Payer: BC Managed Care – PPO | Admitting: Oncology

## 2020-10-17 VITALS — BP 120/72 | HR 99 | Temp 97.5°F | Resp 16 | Ht 63.0 in | Wt 177.0 lb

## 2020-10-17 DIAGNOSIS — F329 Major depressive disorder, single episode, unspecified: Secondary | ICD-10-CM | POA: Diagnosis not present

## 2020-10-17 DIAGNOSIS — C5700 Malignant neoplasm of unspecified fallopian tube: Secondary | ICD-10-CM | POA: Diagnosis not present

## 2020-10-17 DIAGNOSIS — Z5111 Encounter for antineoplastic chemotherapy: Secondary | ICD-10-CM | POA: Diagnosis not present

## 2020-10-17 DIAGNOSIS — Z95828 Presence of other vascular implants and grafts: Secondary | ICD-10-CM

## 2020-10-17 DIAGNOSIS — Z86711 Personal history of pulmonary embolism: Secondary | ICD-10-CM | POA: Diagnosis not present

## 2020-10-17 DIAGNOSIS — E039 Hypothyroidism, unspecified: Secondary | ICD-10-CM | POA: Diagnosis not present

## 2020-10-17 DIAGNOSIS — C5702 Malignant neoplasm of left fallopian tube: Secondary | ICD-10-CM

## 2020-10-17 LAB — CMP (CANCER CENTER ONLY)
ALT: 17 U/L (ref 0–44)
AST: 17 U/L (ref 15–41)
Albumin: 3.4 g/dL — ABNORMAL LOW (ref 3.5–5.0)
Alkaline Phosphatase: 78 U/L (ref 38–126)
Anion gap: 11 (ref 5–15)
BUN: 12 mg/dL (ref 8–23)
CO2: 21 mmol/L — ABNORMAL LOW (ref 22–32)
Calcium: 8.6 mg/dL — ABNORMAL LOW (ref 8.9–10.3)
Chloride: 109 mmol/L (ref 98–111)
Creatinine: 0.88 mg/dL (ref 0.44–1.00)
GFR, Estimated: >60 mL/min (ref >60)
Glucose, Bld: 118 mg/dL — ABNORMAL HIGH (ref 70–99)
Potassium: 3.4 mmol/L — ABNORMAL LOW (ref 3.5–5.1)
Sodium: 141 mmol/L (ref 135–145)
Total Bilirubin: 0.4 mg/dL (ref 0.3–1.2)
Total Protein: 6.6 g/dL (ref 6.5–8.1)

## 2020-10-17 LAB — CBC WITH DIFFERENTIAL (CANCER CENTER ONLY)
Abs Immature Granulocytes: 0.01 10*3/uL (ref 0.00–0.07)
Basophils Absolute: 0 10*3/uL (ref 0.0–0.1)
Basophils Relative: 0 %
Eosinophils Absolute: 0 10*3/uL (ref 0.0–0.5)
Eosinophils Relative: 0 %
HCT: 22.8 % — ABNORMAL LOW (ref 36.0–46.0)
Hemoglobin: 7.5 g/dL — ABNORMAL LOW (ref 12.0–15.0)
Immature Granulocytes: 0 %
Lymphocytes Relative: 47 %
Lymphs Abs: 1.5 10*3/uL (ref 0.7–4.0)
MCH: 37.3 pg — ABNORMAL HIGH (ref 26.0–34.0)
MCHC: 32.9 g/dL (ref 30.0–36.0)
MCV: 113.4 fL — ABNORMAL HIGH (ref 80.0–100.0)
Monocytes Absolute: 0.4 10*3/uL (ref 0.1–1.0)
Monocytes Relative: 11 %
Neutro Abs: 1.3 10*3/uL — ABNORMAL LOW (ref 1.7–7.7)
Neutrophils Relative %: 42 %
Platelet Count: 94 10*3/uL — ABNORMAL LOW (ref 150–400)
RBC: 2.01 MIL/uL — ABNORMAL LOW (ref 3.87–5.11)
RDW: 16 % — ABNORMAL HIGH (ref 11.5–15.5)
WBC Count: 3.2 10*3/uL — ABNORMAL LOW (ref 4.0–10.5)
nRBC: 0 % (ref 0.0–0.2)

## 2020-10-17 MED ORDER — FLUCONAZOLE 100 MG PO TABS: 100.0000 mg | ORAL_TABLET | Freq: Every day | ORAL | 0 refills | Status: AC

## 2020-10-17 MED ORDER — HEPARIN SOD (PORK) LOCK FLUSH 100 UNIT/ML IV SOLN
500.0000 [IU] | Freq: Once | INTRAVENOUS | Status: AC
  Administered 2020-10-17: 500 [IU] via INTRAVENOUS
  Filled 2020-10-17: qty 5

## 2020-10-17 MED ORDER — SODIUM CHLORIDE 0.9% FLUSH
10.0000 mL | INTRAVENOUS | Status: DC | PRN
  Administered 2020-10-17: 10 mL via INTRAVENOUS
  Filled 2020-10-17: qty 10

## 2020-10-17 NOTE — Progress Notes (Signed)
[facility] OFFICE PROGRESS NOTE   Diagnosis: Fallopian tube carcinoma  INTERVAL HISTORY:   [name] completed another cycle of Taxol/carboplatin on 09/26/2020.  She reports myalgias for a few days following chemotherapy.  No bleeding.  No abdominal pain.  No neuropathy symptoms.  Objective:  Vital signs in last 24 hours:  Blood pressure (!) 133/92, pulse 99, temperature (!) 97.5 F (36.4 C), temperature source Tympanic, resp. rate 16, height 5' 3" (1.6 m), weight 177 lb (80.3 kg), SpO2 100 %.    HEENT: Thick white coat over the tongue, no buccal thrush or ulcers Resp: Lungs clear bilaterally Cardio: Regular rate and rhythm GI: No hepatosplenomegaly, no mass, no apparent ascites Vascular: No leg edema  Portacath/PICC-without erythema  Lab Results:  Lab Results  Component Value Date   WBC 3.2 (L) 10/17/2020   HGB 7.5 (L) 10/17/2020   HCT 22.8 (L) 10/17/2020   MCV 113.4 (H) 10/17/2020   PLT 94 (L) 10/17/2020   NEUTROABS 1.3 (L) 10/17/2020    CMP  Lab Results  Component Value Date   NA 139 09/26/2020   K 3.7 09/26/2020   CL 105 09/26/2020   CO2 22 09/26/2020   GLUCOSE 115 (H) 09/26/2020   BUN 18 09/26/2020   CREATININE 0.93 09/26/2020   CALCIUM 9.4 09/26/2020   PROT 7.1 09/26/2020   ALBUMIN 3.7 09/26/2020   AST 18 09/26/2020   ALT 20 09/26/2020   ALKPHOS 84 09/26/2020   BILITOT 0.4 09/26/2020   GFRNONAA >60 09/26/2020   GFRAA >60 08/10/2020    Lab Results  Component Value Date   CEA1 <1.00 06/27/2017    Medications: I have reviewed the patient's current medications.   Assessment/Plan: 1. Left abdomen/pelvic pain ? CT abdomen/pelvis 06/26/2017-soft tissue implants in the lower anterior peritoneum with implants at the paracolic gutters and left pelvic sidewall ? NegativeMYRIADhereditary cancer panel ? Elevated CA 125 ? CT biopsy of anterior omental mass 07/04/2017-Metastatic carcinoma consistent with a gynecologic  primary ? 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 Cycle 1 adjuvant Taxol/carboplatin 08/05/2017 ? Cycle 2 adjuvant Taxol/carboplatin 08/26/2017 ? Cycle 3 adjuvant Taxol/carboplatin 09/17/2017-Taxol dose reduced secondary to neuropathy and bone pain ? Cycle 4 adjuvant Taxol/carboplatin 10/10/2017 ? Cycle 5 adjuvant Taxol/carboplatin 10/31/2017 ? Cycle 6 adjuvant Taxol/carboplatin 11/21/2017 ? CT abdomen/pelvis 08/08/2019-narrowing of rectosigmoid colon with eccentric serosal implant, 10 mm lymph node at the superior rectal vein, left upper quadrant soft tissue implant, 9 mm precaval node, 15 mm left internal iliac node, no ascites ? Cycle 1 Taxol/carboplatin 08/31/2019 ? Cycle 2 Taxol/carboplatin/Avastin 09/21/2019 ? Cycle 3 Taxol/Carboplatin 10/12/2019 (Avastin discontinued) ? Cycle 4 Taxol/carboplatin November 02, 2019 ? CT abdomen/pelvis 11/17/2019-decrease in size of previously prominent pericaval, left internal iliac, and superior rectal vein nodes, resolved rectosigmoid wall thickening ? Cycle 5 Taxol/carboplatin 11/22/2019 ? Cycle 6 Taxol/carboplatin 12/13/2019 ? Olaparib2/17/2021 ? CT abdomen/pelvis 07/10/2020-enlarging soft tissue at the distal left ureter, the ureter is not dilated, peritoneal implant adjacent to the splenic flexure, enlarged left superior rectal node, lymph nodes along the course of the distal IMV have enlarged, eccentric thickening of the sigmoid colon not seen  ? Olaraparib discontinued 07/12/2020 ? Cycle 1 Taxol/carboplatin 07/21/2020 ? Cycle 2 Taxol/carboplatin 08/10/2020 (  carboplatin dose reduced  secondary to thrombocytopenia) ? Cycle 3 Taxol/carboplatin 09/05/2020 ? Cycle 4 Taxol/carboplatin 09/26/2020  2. Depression  3. Hypothyroid  4. Family history of uterine cancer-paternal grandmother  51.Acute onset dyspnea/pleuritic left-sided chest pain 10/02/2019  CT chest 10/04/2019-left lower lobe pulmonary emboli, multifocal pneumonia lower lobes  On Xarelto, status post course of Levaquin.      Disposition: [name] appears unchanged.  She has completed 4 cycles of salvage therapy with Taxol/carboplatin.  She has mild neutropenia and thrombocytopenia today.  We decided to delay chemotherapy for 1 week.  She will receive G-CSF with the next cycle of chemotherapy.  She is scheduled for a restaging CT evaluation prior to an office visit on 11/14/2020.  She will complete a course of Diflucan for oral candidiasis.  [name], MD  10/17/2020  8:42 AM

## 2020-10-18 LAB — CA 125: Cancer Antigen (CA) 125: 43.7 U/mL — ABNORMAL HIGH (ref 0.0–38.1)

## 2020-10-19 ENCOUNTER — Telehealth: Payer: Self-pay | Admitting: Oncology

## 2020-10-19 NOTE — Telephone Encounter (Signed)
Scheduled appointments per 11/30 los. Spoke to patient who is aware of appointments dates and times.  °

## 2020-10-23 ENCOUNTER — Other Ambulatory Visit: Payer: Self-pay | Admitting: *Deleted

## 2020-10-23 DIAGNOSIS — C5702 Malignant neoplasm of left fallopian tube: Secondary | ICD-10-CM

## 2020-10-24 ENCOUNTER — Inpatient Hospital Stay: Payer: BC Managed Care – PPO | Attending: Oncology

## 2020-10-24 ENCOUNTER — Inpatient Hospital Stay: Payer: BC Managed Care – PPO

## 2020-10-24 ENCOUNTER — Other Ambulatory Visit: Payer: Self-pay

## 2020-10-24 ENCOUNTER — Other Ambulatory Visit: Payer: Self-pay | Admitting: Oncology

## 2020-10-24 VITALS — BP 144/86 | HR 90 | Temp 97.9°F | Resp 16 | Wt 177.0 lb

## 2020-10-24 DIAGNOSIS — C5700 Malignant neoplasm of unspecified fallopian tube: Secondary | ICD-10-CM | POA: Diagnosis not present

## 2020-10-24 DIAGNOSIS — Z5111 Encounter for antineoplastic chemotherapy: Secondary | ICD-10-CM | POA: Insufficient documentation

## 2020-10-24 DIAGNOSIS — Z7901 Long term (current) use of anticoagulants: Secondary | ICD-10-CM | POA: Diagnosis not present

## 2020-10-24 DIAGNOSIS — E039 Hypothyroidism, unspecified: Secondary | ICD-10-CM | POA: Insufficient documentation

## 2020-10-24 DIAGNOSIS — R200 Anesthesia of skin: Secondary | ICD-10-CM | POA: Diagnosis not present

## 2020-10-24 DIAGNOSIS — Z95828 Presence of other vascular implants and grafts: Secondary | ICD-10-CM

## 2020-10-24 DIAGNOSIS — Z808 Family history of malignant neoplasm of other organs or systems: Secondary | ICD-10-CM | POA: Diagnosis not present

## 2020-10-24 DIAGNOSIS — F329 Major depressive disorder, single episode, unspecified: Secondary | ICD-10-CM | POA: Diagnosis not present

## 2020-10-24 DIAGNOSIS — Z5189 Encounter for other specified aftercare: Secondary | ICD-10-CM | POA: Insufficient documentation

## 2020-10-24 DIAGNOSIS — C5702 Malignant neoplasm of left fallopian tube: Secondary | ICD-10-CM

## 2020-10-24 DIAGNOSIS — Z8701 Personal history of pneumonia (recurrent): Secondary | ICD-10-CM | POA: Diagnosis not present

## 2020-10-24 DIAGNOSIS — Z86711 Personal history of pulmonary embolism: Secondary | ICD-10-CM | POA: Insufficient documentation

## 2020-10-24 LAB — CMP (CANCER CENTER ONLY)
ALT: 19 U/L (ref 0–44)
AST: 19 U/L (ref 15–41)
Albumin: 3.4 g/dL — ABNORMAL LOW (ref 3.5–5.0)
Alkaline Phosphatase: 79 U/L (ref 38–126)
Anion gap: 12 (ref 5–15)
BUN: 10 mg/dL (ref 8–23)
CO2: 20 mmol/L — ABNORMAL LOW (ref 22–32)
Calcium: 8.5 mg/dL — ABNORMAL LOW (ref 8.9–10.3)
Chloride: 110 mmol/L (ref 98–111)
Creatinine: 0.79 mg/dL (ref 0.44–1.00)
GFR, Estimated: >60 mL/min (ref >60)
Glucose, Bld: 104 mg/dL — ABNORMAL HIGH (ref 70–99)
Potassium: 3.9 mmol/L (ref 3.5–5.1)
Sodium: 142 mmol/L (ref 135–145)
Total Bilirubin: 0.3 mg/dL (ref 0.3–1.2)
Total Protein: 6.8 g/dL (ref 6.5–8.1)

## 2020-10-24 LAB — CBC WITH DIFFERENTIAL (CANCER CENTER ONLY)
Abs Immature Granulocytes: 0.01 10*3/uL (ref 0.00–0.07)
Basophils Absolute: 0 10*3/uL (ref 0.0–0.1)
Basophils Relative: 0 %
Eosinophils Absolute: 0 10*3/uL (ref 0.0–0.5)
Eosinophils Relative: 1 %
HCT: 23.4 % — ABNORMAL LOW (ref 36.0–46.0)
Hemoglobin: 7.5 g/dL — ABNORMAL LOW (ref 12.0–15.0)
Immature Granulocytes: 0 %
Lymphocytes Relative: 46 %
Lymphs Abs: 1.6 10*3/uL (ref 0.7–4.0)
MCH: 37.1 pg — ABNORMAL HIGH (ref 26.0–34.0)
MCHC: 32.1 g/dL (ref 30.0–36.0)
MCV: 115.8 fL — ABNORMAL HIGH (ref 80.0–100.0)
Monocytes Absolute: 0.5 10*3/uL (ref 0.1–1.0)
Monocytes Relative: 13 %
Neutro Abs: 1.4 10*3/uL — ABNORMAL LOW (ref 1.7–7.7)
Neutrophils Relative %: 40 %
Platelet Count: 103 10*3/uL — ABNORMAL LOW (ref 150–400)
RBC: 2.02 MIL/uL — ABNORMAL LOW (ref 3.87–5.11)
RDW: 16.5 % — ABNORMAL HIGH (ref 11.5–15.5)
WBC Count: 3.6 10*3/uL — ABNORMAL LOW (ref 4.0–10.5)
nRBC: 0 % (ref 0.0–0.2)

## 2020-10-24 MED ORDER — SODIUM CHLORIDE 0.9% FLUSH
10.0000 mL | INTRAVENOUS | Status: DC | PRN
  Administered 2020-10-24: 10 mL
  Filled 2020-10-24: qty 10

## 2020-10-24 MED ORDER — HEPARIN SOD (PORK) LOCK FLUSH 100 UNIT/ML IV SOLN
500.0000 [IU] | Freq: Once | INTRAVENOUS | Status: AC | PRN
  Administered 2020-10-24: 500 [IU]
  Filled 2020-10-24: qty 5

## 2020-10-24 MED ORDER — FAMOTIDINE IN NACL 20-0.9 MG/50ML-% IV SOLN
20.0000 mg | Freq: Once | INTRAVENOUS | Status: AC
  Administered 2020-10-24: 20 mg via INTRAVENOUS

## 2020-10-24 MED ORDER — SODIUM CHLORIDE 0.9 % IV SOLN
150.0000 mg | Freq: Once | INTRAVENOUS | Status: AC
  Administered 2020-10-24: 150 mg via INTRAVENOUS
  Filled 2020-10-24: qty 150

## 2020-10-24 MED ORDER — SODIUM CHLORIDE 0.9 % IV SOLN
10.0000 mg | Freq: Once | INTRAVENOUS | Status: AC
  Administered 2020-10-24: 10 mg via INTRAVENOUS
  Filled 2020-10-24: qty 10

## 2020-10-24 MED ORDER — PALONOSETRON HCL INJECTION 0.25 MG/5ML
0.2500 mg | Freq: Once | INTRAVENOUS | Status: AC
  Administered 2020-10-24: 0.25 mg via INTRAVENOUS

## 2020-10-24 MED ORDER — SODIUM CHLORIDE 0.9 % IV SOLN
140.0000 mg/m2 | Freq: Once | INTRAVENOUS | Status: AC
  Administered 2020-10-24: 258 mg via INTRAVENOUS
  Filled 2020-10-24: qty 43

## 2020-10-24 MED ORDER — FAMOTIDINE IN NACL 20-0.9 MG/50ML-% IV SOLN
INTRAVENOUS | Status: AC
  Filled 2020-10-24: qty 50

## 2020-10-24 MED ORDER — SODIUM CHLORIDE 0.9 % IV SOLN
341.7000 mg | Freq: Once | INTRAVENOUS | Status: AC
  Administered 2020-10-24: 340 mg via INTRAVENOUS
  Filled 2020-10-24: qty 34

## 2020-10-24 MED ORDER — SODIUM CHLORIDE 0.9 % IV SOLN
Freq: Once | INTRAVENOUS | Status: AC
  Filled 2020-10-24: qty 250

## 2020-10-24 MED ORDER — DIPHENHYDRAMINE HCL 50 MG/ML IJ SOLN
INTRAMUSCULAR | Status: AC
  Filled 2020-10-24: qty 1

## 2020-10-24 MED ORDER — DIPHENHYDRAMINE HCL 50 MG/ML IJ SOLN
50.0000 mg | Freq: Once | INTRAMUSCULAR | Status: AC
  Administered 2020-10-24: 50 mg via INTRAVENOUS

## 2020-10-24 MED ORDER — PALONOSETRON HCL INJECTION 0.25 MG/5ML
INTRAVENOUS | Status: AC
  Filled 2020-10-24: qty 5

## 2020-10-24 NOTE — Patient Instructions (Signed)
[facility] Discharge Instructions for Patients Receiving Chemotherapy  Today you received the following chemotherapy agents: Paclitaxel (Taxol) and Carboplatin  To help prevent nausea and vomiting after your treatment, we encourage you to take your nausea medication as directed by your MD.   If you develop nausea and vomiting that is not controlled by your nausea medication, call the clinic.   BELOW ARE SYMPTOMS THAT SHOULD BE REPORTED IMMEDIATELY:  *FEVER GREATER THAN 100.5 F  *CHILLS WITH OR WITHOUT FEVER  NAUSEA AND VOMITING THAT IS NOT CONTROLLED WITH YOUR NAUSEA MEDICATION  *UNUSUAL SHORTNESS OF BREATH  *UNUSUAL BRUISING OR BLEEDING  TENDERNESS IN MOUTH AND THROAT WITH OR WITHOUT PRESENCE OF ULCERS  *URINARY PROBLEMS  *BOWEL PROBLEMS  UNUSUAL RASH Items with * indicate a potential emergency and should be followed up as soon as possible.  Feel free to call the clinic should you have any questions or concerns. The clinic phone number is [phone].  Please show the CHEMO ALERT CARD at check-in to the Emergency Department and triage nurse.   Neutropenia Neutropenia is a condition that occurs when you have a lower-than-normal level of a type of white blood cell (neutrophil) in your body. Neutrophils are made in the spongy center of large bones (bone marrow), and they fight infections. Neutrophils are your body's main defense against bacterial and fungal infections. The fewer neutrophils you have and the longer your body remains without them, the greater your risk of getting a severe infection. What are the causes? This condition can occur if your body uses up or destroys neutrophils faster than your bone marrow can make them. Neutropenia may be caused by:  A bacterial or fungal infection.  Allergic disorders.  Reactions to some medicines.  An autoimmune disease.  An enlarged spleen. This condition can also occur if your bone marrow does not  produce enough neutrophils. This problem may be caused by:  Cancer.  Cancer treatments, such as radiation or chemotherapy.  Viral infections.  Medicines, such as phenytoin.  Vitamin B12 deficiency.  Diseases of the bone marrow.  Environmental toxins, such as insecticides. What are the signs or symptoms? This condition does not usually cause symptoms. If symptoms are present, they are usually caused by an underlying infection. Symptoms of an infection may include:  Fever.  Chills.  Swollen glands.  Oral or anal ulcers.  Cough and shortness of breath.  Rash.  Skin infection.  Fatigue. How is this diagnosed? Your health care provider may suspect neutropenia if you have:  A condition that may cause neutropenia.  Symptoms during or after treatment for cancer.  Symptoms of infection, especially fever.  Frequent and unusual infections. This condition is diagnosed based on your medical history and a physical exam. Tests will also be done, such as:  A complete blood count (CBC).  A procedure to collect a sample of bone marrow for examination (bone marrow biopsy).  A chest X-ray.  A urine culture.  A blood culture. How is this treated? Treatment depends on the underlying cause and severity of your condition. Mild neutropenia may not require treatment. Treatment may include medicines, such as:  Antibiotic medicine given through an IV.  Antiviral medicines.  Antifungal medicines.  A medicine to increase neutrophil production (colony-stimulating factor). You may get this drug through an IV or by injection.  Steroids given through an IV. If an underlying condition is causing neutropenia, you may need treatment for that condition. If medicines or cancer treatments are causing neutropenia,   your health care provider may have you stop the medicines or treatment. Follow these instructions at home: Medicines   Take over-the-counter and prescription medicines only as  told by your health care provider.  Get a seasonal flu shot (influenza vaccine).  Avoid people who received a vaccine in the past 30 days if that vaccine contained a live version of the germ (live vaccine). You should not get a live vaccine. Common live vaccines are polio, MMR, chicken pox, and shingles vaccines. Eating and drinking  Do not share food utensils.  Do not eat unpasteurized foods.  Do not eat raw or undercooked meat, eggs, or seafood.  Do not eat unwashed, raw fruits or vegetables. Lifestyle  Avoid exposure to groups of people or children.  Avoid being around people who are sick.  Avoid being around dirt or dust, such as in construction areas or gardens.  Do not provide direct care for pets. Avoid animal droppings. Do not clean litter boxes and bird cages.  Do not have sex unless your health care provider has approved. Hygiene   Bathe daily.  Clean the area between the genitals and the anus (perineal area) after you urinate or have a bowel movement. If you are female, wipe from front to back.  Brush your teeth with a soft toothbrush before and after meals.  Do not use a regular razor. Use an electric razor to remove hair.  Wash your hands often. Make sure others who come in contact with you also wash their hands. If soap and water are not available, use hand sanitizer. General instructions  Follow any precautions as told by your health care provider to reduce your risk for injury or infection.  Take actions to avoid cuts and burns. For example: ? Be cautious when you use knives. Always cut away from yourself. ? Keep knives in protective sheaths or guards when not in use. ? Use oven mitts when you cook with a hot stove, oven, or grill. ? Stand a safe distance away from open fires.  Do not use tampons, enemas, or rectal suppositories unless your health care provider has approved.  Keep all follow-up visits as told by your health care provider. This is  important. Contact a health care provider if:  You have: ? A sore throat. ? A warm, red, or tender area on your skin. ? A cough. ? Frequent or painful urination. ? Vaginal discharge or itching.  You develop: ? Sores in your mouth or anus. ? Swollen lymph nodes. ? Red streaks on the skin. ? A rash. Get help right away if:  You have: ? A fever. ? Chills, or you start to shake.  You feel: ? Nauseous, or you vomit. ? Very fatigued. ? Short of breath. Summary  Neutropenia is a condition that occurs when you have a lower-than-normal level of a type of white blood cell (neutrophil) in your body.  This condition can occur if your body uses up or destroys neutrophils faster than your bone marrow can make them.  Treatment depends on the underlying cause and severity of your condition. Mild neutropenia may not require treatment.  Follow any precautions as told by your health care provider to reduce your risk for injury or infection. This information is not intended to replace advice given to you by your health care provider. Make sure you discuss any questions you have with your health care provider. Document Revised: 08/20/2018 Document Reviewed: 08/20/2018 Elsevier Patient Education  2020 [employer]   

## 2020-10-24 NOTE — Progress Notes (Signed)
Per Dr. Benay Spice ok for treatment today with ANC 1.4, Hemoglobin 7.5 and platelets 103. Pt. denies complaints of chest pain, dizziness, weakness, and no shortness of breath noted. Pt. stable for discharge. Left via ambulation, no respiratory distress noted.

## 2020-10-25 ENCOUNTER — Inpatient Hospital Stay: Payer: BC Managed Care – PPO

## 2020-10-25 ENCOUNTER — Other Ambulatory Visit: Payer: Self-pay

## 2020-10-25 VITALS — BP 132/79 | HR 93 | Temp 98.3°F | Resp 18

## 2020-10-25 DIAGNOSIS — Z5189 Encounter for other specified aftercare: Secondary | ICD-10-CM | POA: Diagnosis not present

## 2020-10-25 DIAGNOSIS — Z8701 Personal history of pneumonia (recurrent): Secondary | ICD-10-CM | POA: Diagnosis not present

## 2020-10-25 DIAGNOSIS — Z808 Family history of malignant neoplasm of other organs or systems: Secondary | ICD-10-CM | POA: Diagnosis not present

## 2020-10-25 DIAGNOSIS — F329 Major depressive disorder, single episode, unspecified: Secondary | ICD-10-CM | POA: Diagnosis not present

## 2020-10-25 DIAGNOSIS — Z86711 Personal history of pulmonary embolism: Secondary | ICD-10-CM | POA: Diagnosis not present

## 2020-10-25 DIAGNOSIS — R200 Anesthesia of skin: Secondary | ICD-10-CM | POA: Diagnosis not present

## 2020-10-25 DIAGNOSIS — Z7901 Long term (current) use of anticoagulants: Secondary | ICD-10-CM | POA: Diagnosis not present

## 2020-10-25 DIAGNOSIS — C5702 Malignant neoplasm of left fallopian tube: Secondary | ICD-10-CM

## 2020-10-25 DIAGNOSIS — E039 Hypothyroidism, unspecified: Secondary | ICD-10-CM | POA: Diagnosis not present

## 2020-10-25 DIAGNOSIS — C5700 Malignant neoplasm of unspecified fallopian tube: Secondary | ICD-10-CM | POA: Diagnosis not present

## 2020-10-25 DIAGNOSIS — Z5111 Encounter for antineoplastic chemotherapy: Secondary | ICD-10-CM | POA: Diagnosis not present

## 2020-10-25 MED ORDER — PEGFILGRASTIM-CBQV 6 MG/0.6ML ~~LOC~~ SOSY
6.0000 mg | PREFILLED_SYRINGE | Freq: Once | SUBCUTANEOUS | Status: AC
  Administered 2020-10-25: 6 mg via SUBCUTANEOUS

## 2020-10-25 NOTE — Patient Instructions (Signed)

## 2020-10-27 ENCOUNTER — Ambulatory Visit: Payer: BC Managed Care – PPO

## 2020-10-27 ENCOUNTER — Other Ambulatory Visit: Payer: BC Managed Care – PPO

## 2020-10-30 ENCOUNTER — Other Ambulatory Visit: Payer: Self-pay | Admitting: Internal Medicine

## 2020-11-01 ENCOUNTER — Telehealth: Payer: Self-pay | Admitting: Internal Medicine

## 2020-11-01 MED ORDER — EPINEPHRINE 0.3 MG/0.3ML IJ SOAJ
0.3000 mg | INTRAMUSCULAR | 0 refills | Status: DC | PRN
Start: 2020-11-01 — End: 2021-06-20

## 2020-11-01 NOTE — Telephone Encounter (Signed)
Patient called in for refill for EPINEPHrine 0.3 mg/0.3 mL IJ SOAJ injection

## 2020-11-12 ENCOUNTER — Other Ambulatory Visit: Payer: Self-pay | Admitting: Oncology

## 2020-11-13 ENCOUNTER — Ambulatory Visit (HOSPITAL_COMMUNITY)
Admission: RE | Admit: 2020-11-13 | Discharge: 2020-11-13 | Disposition: A | Discharge status: Home / Self Care | Payer: BC Managed Care – PPO | Source: Ambulatory Visit | Attending: Oncology | Admitting: Oncology

## 2020-11-13 ENCOUNTER — Other Ambulatory Visit: Payer: Self-pay

## 2020-11-13 ENCOUNTER — Inpatient Hospital Stay: Payer: BC Managed Care – PPO

## 2020-11-13 DIAGNOSIS — C799 Secondary malignant neoplasm of unspecified site: Secondary | ICD-10-CM | POA: Diagnosis not present

## 2020-11-13 DIAGNOSIS — E039 Hypothyroidism, unspecified: Secondary | ICD-10-CM | POA: Diagnosis not present

## 2020-11-13 DIAGNOSIS — Z7901 Long term (current) use of anticoagulants: Secondary | ICD-10-CM | POA: Diagnosis not present

## 2020-11-13 DIAGNOSIS — Z808 Family history of malignant neoplasm of other organs or systems: Secondary | ICD-10-CM | POA: Diagnosis not present

## 2020-11-13 DIAGNOSIS — Z5189 Encounter for other specified aftercare: Secondary | ICD-10-CM | POA: Diagnosis not present

## 2020-11-13 DIAGNOSIS — C5702 Malignant neoplasm of left fallopian tube: Secondary | ICD-10-CM

## 2020-11-13 DIAGNOSIS — Z86711 Personal history of pulmonary embolism: Secondary | ICD-10-CM | POA: Diagnosis not present

## 2020-11-13 DIAGNOSIS — Z8701 Personal history of pneumonia (recurrent): Secondary | ICD-10-CM | POA: Diagnosis not present

## 2020-11-13 DIAGNOSIS — F329 Major depressive disorder, single episode, unspecified: Secondary | ICD-10-CM | POA: Diagnosis not present

## 2020-11-13 DIAGNOSIS — R200 Anesthesia of skin: Secondary | ICD-10-CM | POA: Diagnosis not present

## 2020-11-13 DIAGNOSIS — Z5111 Encounter for antineoplastic chemotherapy: Secondary | ICD-10-CM | POA: Diagnosis not present

## 2020-11-13 DIAGNOSIS — C5700 Malignant neoplasm of unspecified fallopian tube: Secondary | ICD-10-CM | POA: Diagnosis not present

## 2020-11-13 DIAGNOSIS — Z95828 Presence of other vascular implants and grafts: Secondary | ICD-10-CM

## 2020-11-13 LAB — CBC WITH DIFFERENTIAL (CANCER CENTER ONLY)
Abs Immature Granulocytes: 0.01 10*3/uL (ref 0.00–0.07)
Basophils Absolute: 0 10*3/uL (ref 0.0–0.1)
Basophils Relative: 1 %
Eosinophils Absolute: 0 10*3/uL (ref 0.0–0.5)
Eosinophils Relative: 1 %
HCT: 24.6 % — ABNORMAL LOW (ref 36.0–46.0)
Hemoglobin: 7.9 g/dL — ABNORMAL LOW (ref 12.0–15.0)
Immature Granulocytes: 0 %
Lymphocytes Relative: 47 %
Lymphs Abs: 1.8 10*3/uL (ref 0.7–4.0)
MCH: 37.4 pg — ABNORMAL HIGH (ref 26.0–34.0)
MCHC: 32.1 g/dL (ref 30.0–36.0)
MCV: 116.6 fL — ABNORMAL HIGH (ref 80.0–100.0)
Monocytes Absolute: 0.5 10*3/uL (ref 0.1–1.0)
Monocytes Relative: 14 %
Neutro Abs: 1.4 10*3/uL — ABNORMAL LOW (ref 1.7–7.7)
Neutrophils Relative %: 37 %
Platelet Count: 159 10*3/uL (ref 150–400)
RBC: 2.11 MIL/uL — ABNORMAL LOW (ref 3.87–5.11)
RDW: 16.5 % — ABNORMAL HIGH (ref 11.5–15.5)
WBC Count: 3.9 10*3/uL — ABNORMAL LOW (ref 4.0–10.5)
nRBC: 0 % (ref 0.0–0.2)

## 2020-11-13 LAB — CMP (CANCER CENTER ONLY)
ALT: 20 U/L (ref 0–44)
AST: 19 U/L (ref 15–41)
Albumin: 3.5 g/dL (ref 3.5–5.0)
Alkaline Phosphatase: 85 U/L (ref 38–126)
Anion gap: 9 (ref 5–15)
BUN: 11 mg/dL (ref 8–23)
CO2: 26 mmol/L (ref 22–32)
Calcium: 8.8 mg/dL — ABNORMAL LOW (ref 8.9–10.3)
Chloride: 106 mmol/L (ref 98–111)
Creatinine: 0.82 mg/dL (ref 0.44–1.00)
GFR, Estimated: >60 mL/min (ref >60)
Glucose, Bld: 98 mg/dL (ref 70–99)
Potassium: 3.8 mmol/L (ref 3.5–5.1)
Sodium: 141 mmol/L (ref 135–145)
Total Bilirubin: 0.2 mg/dL — ABNORMAL LOW (ref 0.3–1.2)
Total Protein: 7 g/dL (ref 6.5–8.1)

## 2020-11-13 MED ORDER — IOHEXOL 300 MG/ML  SOLN
100.0000 mL | Freq: Once | INTRAMUSCULAR | Status: AC | PRN
  Administered 2020-11-13: 100 mL via INTRAVENOUS

## 2020-11-13 MED ORDER — SODIUM CHLORIDE 0.9% FLUSH
10.0000 mL | INTRAVENOUS | Status: DC | PRN
  Administered 2020-11-13: 10 mL
  Filled 2020-11-13: qty 10

## 2020-11-13 MED ORDER — HEPARIN SOD (PORK) LOCK FLUSH 100 UNIT/ML IV SOLN
INTRAVENOUS | Status: AC
  Administered 2020-11-13: 500 [IU] via INTRAVENOUS
  Filled 2020-11-13: qty 5

## 2020-11-13 MED ORDER — HEPARIN SOD (PORK) LOCK FLUSH 100 UNIT/ML IV SOLN: 500.0000 [IU] | Freq: Once | INTRAVENOUS | Status: AC

## 2020-11-14 ENCOUNTER — Other Ambulatory Visit: Payer: Self-pay

## 2020-11-14 ENCOUNTER — Inpatient Hospital Stay (HOSPITAL_BASED_OUTPATIENT_CLINIC_OR_DEPARTMENT_OTHER): Payer: BC Managed Care – PPO | Admitting: Oncology

## 2020-11-14 ENCOUNTER — Telehealth: Payer: Self-pay | Admitting: Oncology

## 2020-11-14 ENCOUNTER — Inpatient Hospital Stay: Payer: BC Managed Care – PPO

## 2020-11-14 VITALS — BP 140/72 | HR 92 | Temp 98.2°F | Resp 17

## 2020-11-14 VITALS — BP 148/87 | HR 103 | Temp 97.6°F | Resp 17 | Ht 63.0 in | Wt 178.3 lb

## 2020-11-14 DIAGNOSIS — Z5189 Encounter for other specified aftercare: Secondary | ICD-10-CM | POA: Diagnosis not present

## 2020-11-14 DIAGNOSIS — F329 Major depressive disorder, single episode, unspecified: Secondary | ICD-10-CM | POA: Diagnosis not present

## 2020-11-14 DIAGNOSIS — Z86711 Personal history of pulmonary embolism: Secondary | ICD-10-CM | POA: Diagnosis not present

## 2020-11-14 DIAGNOSIS — E039 Hypothyroidism, unspecified: Secondary | ICD-10-CM | POA: Diagnosis not present

## 2020-11-14 DIAGNOSIS — C5702 Malignant neoplasm of left fallopian tube: Secondary | ICD-10-CM

## 2020-11-14 DIAGNOSIS — C5700 Malignant neoplasm of unspecified fallopian tube: Secondary | ICD-10-CM | POA: Diagnosis not present

## 2020-11-14 DIAGNOSIS — R200 Anesthesia of skin: Secondary | ICD-10-CM | POA: Diagnosis not present

## 2020-11-14 DIAGNOSIS — Z8701 Personal history of pneumonia (recurrent): Secondary | ICD-10-CM | POA: Diagnosis not present

## 2020-11-14 DIAGNOSIS — Z5111 Encounter for antineoplastic chemotherapy: Secondary | ICD-10-CM | POA: Diagnosis not present

## 2020-11-14 DIAGNOSIS — Z7901 Long term (current) use of anticoagulants: Secondary | ICD-10-CM | POA: Diagnosis not present

## 2020-11-14 DIAGNOSIS — Z808 Family history of malignant neoplasm of other organs or systems: Secondary | ICD-10-CM | POA: Diagnosis not present

## 2020-11-14 LAB — CA 125: Cancer Antigen (CA) 125: 50.8 U/mL — ABNORMAL HIGH (ref 0.0–38.1)

## 2020-11-14 MED ORDER — SODIUM CHLORIDE 0.9 % IV SOLN
335.1000 mg | Freq: Once | INTRAVENOUS | Status: AC
  Administered 2020-11-14: 340 mg via INTRAVENOUS
  Filled 2020-11-14: qty 34

## 2020-11-14 MED ORDER — SODIUM CHLORIDE 0.9 % IV SOLN
10.0000 mg | Freq: Once | INTRAVENOUS | Status: AC
  Administered 2020-11-14: 10 mg via INTRAVENOUS
  Filled 2020-11-14: qty 10

## 2020-11-14 MED ORDER — PALONOSETRON HCL INJECTION 0.25 MG/5ML
INTRAVENOUS | Status: AC
  Filled 2020-11-14: qty 5

## 2020-11-14 MED ORDER — SODIUM CHLORIDE 0.9% FLUSH
10.0000 mL | INTRAVENOUS | Status: DC | PRN
  Filled 2020-11-14: qty 10

## 2020-11-14 MED ORDER — DIPHENHYDRAMINE HCL 50 MG/ML IJ SOLN
INTRAMUSCULAR | Status: AC
  Filled 2020-11-14: qty 1

## 2020-11-14 MED ORDER — HEPARIN SOD (PORK) LOCK FLUSH 100 UNIT/ML IV SOLN
500.0000 [IU] | Freq: Once | INTRAVENOUS | Status: DC | PRN
  Filled 2020-11-14: qty 5

## 2020-11-14 MED ORDER — PALONOSETRON HCL INJECTION 0.25 MG/5ML
0.2500 mg | Freq: Once | INTRAVENOUS | Status: AC
  Administered 2020-11-14: 0.25 mg via INTRAVENOUS

## 2020-11-14 MED ORDER — FAMOTIDINE IN NACL 20-0.9 MG/50ML-% IV SOLN
20.0000 mg | Freq: Once | INTRAVENOUS | Status: AC
  Administered 2020-11-14: 20 mg via INTRAVENOUS

## 2020-11-14 MED ORDER — SODIUM CHLORIDE 0.9 % IV SOLN
Freq: Once | INTRAVENOUS | Status: AC
  Filled 2020-11-14: qty 250

## 2020-11-14 MED ORDER — SODIUM CHLORIDE 0.9 % IV SOLN
140.0000 mg/m2 | Freq: Once | INTRAVENOUS | Status: AC
  Administered 2020-11-14: 258 mg via INTRAVENOUS
  Filled 2020-11-14: qty 43

## 2020-11-14 MED ORDER — SODIUM CHLORIDE 0.9 % IV SOLN
150.0000 mg | Freq: Once | INTRAVENOUS | Status: AC
  Administered 2020-11-14: 150 mg via INTRAVENOUS
  Filled 2020-11-14: qty 150

## 2020-11-14 MED ORDER — DIPHENHYDRAMINE HCL 50 MG/ML IJ SOLN
50.0000 mg | Freq: Once | INTRAMUSCULAR | Status: AC
  Administered 2020-11-14: 50 mg via INTRAVENOUS

## 2020-11-14 MED ORDER — FAMOTIDINE IN NACL 20-0.9 MG/50ML-% IV SOLN
INTRAVENOUS | Status: AC
  Filled 2020-11-14: qty 50

## 2020-11-14 NOTE — Progress Notes (Signed)
Pt discharged in no apparent distress. Pt left ambulatory without assistance. Pt aware of discharge instructions and verbalized understanding and had no further questions.  

## 2020-11-14 NOTE — Progress Notes (Signed)
Ok to treat per Dr. Truett Perna today with Current ANC and Hgb Levels

## 2020-11-14 NOTE — Progress Notes (Signed)
Anna [name] OFFICE PROGRESS NOTE   Diagnosis: Fallopian tube carcinoma  INTERVAL HISTORY:   Anna [name] returns as scheduled.  She complete another cycle of Taxol/carboplatin on 10/24/2020.  She reports mild numbness/tingling in the fingers for a few days following chemotherapy.  This has resolved.  She has intermittent discomfort at the left low abdomen/pelvic brim lasting for seconds.  No other complaint.  Objective:  Vital signs in last 24 hours:  Blood pressure (!) 148/87, pulse (!) 103, temperature 97.6 F (36.4 C), temperature source Tympanic, resp. rate 17, height _0  (1.6 m), weight 178 lb 4.8 oz (80.9 kg), SpO2 100 %.    HEENT: No thrush  Resp: Lungs clear bilaterally Cardio: Regular rate and rhythm GI: No hepatosplenomegaly, no mass, nontender Vascular: No leg edema    Portacath/PICC-without erythema  Lab Results:  Lab Results  Component Value Date   WBC 3.9 (L) 11/13/2020   HGB 7.9 (L) 11/13/2020   HCT 24.6 (L) 11/13/2020   MCV 116.6 (H) 11/13/2020   PLT 159 11/13/2020   NEUTROABS 1.4 (L) 11/13/2020    CMP  Lab Results  Component Value Date   NA 141 11/13/2020   K 3.8 11/13/2020   CL 106 11/13/2020   CO2 26 11/13/2020   GLUCOSE 98 11/13/2020   BUN 11 11/13/2020   CREATININE 0.82 11/13/2020   CALCIUM 8.8 (L) 11/13/2020   PROT 7.0 11/13/2020   ALBUMIN 3.5 11/13/2020   AST 19 11/13/2020   ALT 20 11/13/2020   ALKPHOS 85 11/13/2020   BILITOT 0.2 (L) 11/13/2020   GFRNONAA >60 11/13/2020   GFRAA >60 08/10/2020    Lab Results  Component Value Date   CEA1 <1.00 06/27/2017      Imaging:  CT ABDOMEN PELVIS W CONTRAST  Result Date: 11/13/2020 CLINICAL DATA:  Restaging fallopian tube carcinoma. Undergoing chemotherapy. EXAM: CT ABDOMEN AND PELVIS WITH CONTRAST TECHNIQUE: Multidetector CT imaging of the abdomen and pelvis was performed using the standard protocol following bolus administration of intravenous contrast. CONTRAST:   164m OMNIPAQUE IOHEXOL 300 MG/ML  SOLN COMPARISON:  07/10/2020 FINDINGS: Lower chest: The lung bases are clear of acute process. No pleural effusion or pulmonary lesions. The heart is normal in size. No pericardial effusion. The distal esophagus and aorta are unremarkable. Hepatobiliary: No hepatic lesions or intrahepatic biliary dilatation. Gallbladder is unremarkable. Normal caliber and course of the common bile duct. Pancreas: No mass, inflammation or ductal dilatation. Spleen: Normal size. No focal lesions. Adrenals/Urinary Tract: The adrenal glands and kidneys are unremarkable. No renal lesions or hydronephrosis. The bladder is unremarkable. Stomach/Bowel: The stomach, duodenum, small bowel and colon grossly normal. No acute inflammatory changes, mass lesions or obstructive findings. Vascular/Lymphatic: The aorta and branch vessels are patent. No mesenteric lymphadenopathy. Small scattered upper abdominal lymph nodes are stable. Left common iliac lymph node on image number 55/2 measures 12 mm and previously measured 11 mm. Small lymph nodes in the sigmoid mesocolon are stable. 7 mm lymph node on image 65/2 is unchanged. Smaller lymph node slightly more posteriorly is also stable. Slightly irregular soft tissue density, likely peritoneal implant in the left pelvis on image 66/2 measures 16 x 13 mm and previously measured 14.5 x 11 5 mm. Slightly more inferiorly and laterally there was a nodular lesion on the prior study measuring 21 x 13 mm. This now measures 19 x 12 mm. Left pelvic sidewall/left obturator lymph node on image number 72/2 measures 5.5 mm and is unchanged. Bilateral lateral external iliac  lymph nodes are stable. The left node measures 8.5 mm and the right measures 12 mm. Reproductive: Surgically absent. Other: No pelvic mass or adenopathy. No free pelvic fluid collections. No inguinal mass or adenopathy. Small defects in the anterior abdominal wall are likely postsurgical. Musculoskeletal: No  significant bony findings. IMPRESSION: 1. Overall stable metastatic disease. No new or progressive findings. 2. No acute abdominal/pelvic findings. Electronically Signed   By: Marijo Sanes M.D.   On: 11/13/2020 11:50    Medications: I have reviewed the patient's current medications.   Assessment/Plan:  1. Left abdomen/pelvic pain ? CT abdomen/pelvis 06/26/2017-soft tissue implants in the lower anterior peritoneum with implants at the paracolic gutters and left pelvic sidewall ? NegativeMYRIADhereditary cancer panel ? Elevated CA 125 ? CT biopsy of anterior omental mass 07/04/2017-Metastatic carcinoma consistent with a gynecologic primary ? 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 Cycle 1 adjuvant Taxol/carboplatin 08/05/2017 ? Cycle 2 adjuvant Taxol/carboplatin 08/26/2017 ? Cycle 3 adjuvant Taxol/carboplatin 09/17/2017-Taxol dose reduced secondary to neuropathy and bone pain ? Cycle 4 adjuvant Taxol/carboplatin 10/10/2017 ? Cycle 5 adjuvant Taxol/carboplatin 10/31/2017 ? Cycle 6 adjuvant Taxol/carboplatin 11/21/2017 ? CT abdomen/pelvis 08/08/2019-narrowing of rectosigmoid colon with eccentric serosal implant, 10 mm lymph node at the superior rectal vein, left upper quadrant soft tissue implant, 9 mm precaval node, 15 mm left internal iliac node, no ascites ? Cycle 1 Taxol/carboplatin 08/31/2019 ? Cycle 2 Taxol/carboplatin/Avastin 09/21/2019 ? Cycle 3 Taxol/Carboplatin 10/12/2019 (Avastin discontinued) ? Cycle 4 Taxol/carboplatin November 02, 2019 ? CT abdomen/pelvis 11/17/2019-decrease in size of previously prominent  pericaval, left internal iliac, and superior rectal vein nodes, resolved rectosigmoid wall thickening ? Cycle 5 Taxol/carboplatin 11/22/2019 ? Cycle 6 Taxol/carboplatin 12/13/2019 ? Olaparib2/17/2021 ? CT abdomen/pelvis 07/10/2020-enlarging soft tissue at the distal left ureter, the ureter is not dilated, peritoneal implant adjacent to the splenic flexure, enlarged left superior rectal node, lymph nodes along the course of the distal IMV have enlarged, eccentric thickening of the sigmoid colon not seen  ? Olaraparib discontinued 07/12/2020 ? Cycle 1 Taxol/carboplatin 07/21/2020 ? Cycle 2 Taxol/carboplatin 08/10/2020 (carboplatin dose reduced secondary to thrombocytopenia) ? Cycle 3 Taxol/carboplatin 09/05/2020 ? Cycle 4 Taxol/carboplatin 09/26/2020 ? Cycle 5 Taxol/carboplatin 10/24/2020 ? CT abdomen/pelvis 11/13/2020-overall stable disease, no new site of metastatic disease, generally stable small abdominal/pelvic lymph nodes, slight enlargement of a peritoneal implant in the left pelvis, slight decrease in size of another pelvic implant ? Cycle 6 Taxol/carboplatin 11/14/2020  2. Depression  3. Hypothyroid  4. Family history of uterine cancer-paternal grandmother  67.Acute onset dyspnea/pleuritic left-sided chest pain 10/02/2019  CT chest 10/04/2019-left lower lobe pulmonary emboli, multifocal pneumonia lower lobes  On Xarelto, status post course of Levaquin.   Disposition: Anna [name] appears stable.  The CA-125 has been higher over the past 2 months, but remains lower than when she started the current salvage regimen.  There is no clinical or radiologic evidence of disease progression.  I suspect there may be very slow progression of the carcinomatosis.  I reviewed the CT images and discussed treatment options with Anna [name] and her husband.  We discussed a treatment break, continuing Taxol/carboplatin, and changing to a different systemic regimen.  We decided to continue  Taxol/carboplatin for now.  She  will complete another cycle today.  Anna [name] will return for an office visit and chemotherapy in 3 weeks.  We will follow the CA-125 and her clinical status closely.  The plan is to repeat imaging within the next 2 to 3 months.  Anna Coder, MD  11/14/2020  10:54 AM

## 2020-11-14 NOTE — Patient Instructions (Signed)
[facility] Discharge Instructions for Patients Receiving Chemotherapy  Today you received the following chemotherapy agents: Paclitaxel (Taxol) and Carboplatin  To help prevent nausea and vomiting after your treatment, we encourage you to take your nausea medication as directed by your MD.   If you develop nausea and vomiting that is not controlled by your nausea medication, call the clinic.   BELOW ARE SYMPTOMS THAT SHOULD BE REPORTED IMMEDIATELY:  *FEVER GREATER THAN 100.5 F  *CHILLS WITH OR WITHOUT FEVER  NAUSEA AND VOMITING THAT IS NOT CONTROLLED WITH YOUR NAUSEA MEDICATION  *UNUSUAL SHORTNESS OF BREATH  *UNUSUAL BRUISING OR BLEEDING  TENDERNESS IN MOUTH AND THROAT WITH OR WITHOUT PRESENCE OF ULCERS  *URINARY PROBLEMS  *BOWEL PROBLEMS  UNUSUAL RASH Items with * indicate a potential emergency and should be followed up as soon as possible.  Feel free to call the clinic should you have any questions or concerns. The clinic phone number is [phone].  Please show the [facility] at check-in to the Emergency Department and triage nurse.   Neutropenia Neutropenia is a condition that occurs when you have a lower-than-normal level of a type of white blood cell (neutrophil) in your body. Neutrophils are made in the spongy center of large bones (bone marrow), and they fight infections. Neutrophils are your body's main defense against bacterial and fungal infections. The fewer neutrophils you have and the longer your body remains without them, the greater your risk of getting a severe infection. What are the causes? This condition can occur if your body uses up or destroys neutrophils faster than your bone marrow can make them. Neutropenia may be caused by:  A bacterial or fungal infection.  Allergic disorders.  Reactions to some medicines.  An autoimmune disease.  An enlarged spleen. This condition can also occur if your bone marrow does not  produce enough neutrophils. This problem may be caused by:  Cancer.  Cancer treatments, such as radiation or chemotherapy.  Viral infections.  Medicines, such as phenytoin.  Vitamin B12 deficiency.  Diseases of the bone marrow.  Environmental toxins, such as insecticides. What are the signs or symptoms? This condition does not usually cause symptoms. If symptoms are present, they are usually caused by an underlying infection. Symptoms of an infection may include:  Fever.  Chills.  Swollen glands.  Oral or anal ulcers.  Cough and shortness of breath.  Rash.  Skin infection.  Fatigue. How is this diagnosed? Your health care provider may suspect neutropenia if you have:  A condition that may cause neutropenia.  Symptoms during or after treatment for cancer.  Symptoms of infection, especially fever.  Frequent and unusual infections. This condition is diagnosed based on your medical history and a physical exam. Tests will also be done, such as:  A complete blood count (CBC).  A procedure to collect a sample of bone marrow for examination (bone marrow biopsy).  A chest X-ray.  A urine culture.  A blood culture. How is this treated? Treatment depends on the underlying cause and severity of your condition. Mild neutropenia may not require treatment. Treatment may include medicines, such as:  Antibiotic medicine given through an IV.  Antiviral medicines.  Antifungal medicines.  A medicine to increase neutrophil production (colony-stimulating factor). You may get this drug through an IV or by injection.  Steroids given through an IV. If an underlying condition is causing neutropenia, you may need treatment for that condition. If medicines or cancer treatments are causing neutropenia,  your health care provider may have you stop the medicines or treatment. Follow these instructions at home: Medicines   Take over-the-counter and prescription medicines only as  told by your health care provider.  Get a seasonal flu shot (influenza vaccine).  Avoid people who received a vaccine in the past 30 days if that vaccine contained a live version of the germ (live vaccine). You should not get a live vaccine. Common live vaccines are polio, MMR, chicken pox, and shingles vaccines. Eating and drinking  Do not share food utensils.  Do not eat unpasteurized foods.  Do not eat raw or undercooked meat, eggs, or seafood.  Do not eat unwashed, raw fruits or vegetables. Lifestyle  Avoid exposure to groups of people or children.  Avoid being around people who are sick.  Avoid being around dirt or dust, such as in construction areas or gardens.  Do not provide direct care for pets. Avoid animal droppings. Do not clean litter boxes and bird cages.  Do not have sex unless your health care provider has approved. Hygiene   Bathe daily.  Clean the area between the genitals and the anus (perineal area) after you urinate or have a bowel movement. If you are female, wipe from front to back.  Brush your teeth with a soft toothbrush before and after meals.  Do not use a regular razor. Use an electric razor to remove hair.  Wash your hands often. Make sure others who come in contact with you also wash their hands. If soap and water are not available, use hand sanitizer. General instructions  Follow any precautions as told by your health care provider to reduce your risk for injury or infection.  Take actions to avoid cuts and burns. For example: ? Be cautious when you use knives. Always cut away from yourself. ? Keep knives in protective sheaths or guards when not in use. ? Use oven mitts when you cook with a hot stove, oven, or grill. ? Stand a safe distance away from open fires.  Do not use tampons, enemas, or rectal suppositories unless your health care provider has approved.  Keep all follow-up visits as told by your health care provider. This is  important. Contact a health care provider if:  You have: ? A sore throat. ? A warm, red, or tender area on your skin. ? A cough. ? Frequent or painful urination. ? Vaginal discharge or itching.  You develop: ? Sores in your mouth or anus. ? Swollen lymph nodes. ? Red streaks on the skin. ? A rash. Get help right away if:  You have: ? A fever. ? Chills, or you start to shake.  You feel: ? Nauseous, or you vomit. ? Very fatigued. ? Short of breath. Summary  Neutropenia is a condition that occurs when you have a lower-than-normal level of a type of white blood cell (neutrophil) in your body.  This condition can occur if your body uses up or destroys neutrophils faster than your bone marrow can make them.  Treatment depends on the underlying cause and severity of your condition. Mild neutropenia may not require treatment.  Follow any precautions as told by your health care provider to reduce your risk for injury or infection. This information is not intended to replace advice given to you by your health care provider. Make sure you discuss any questions you have with your health care provider. Document Revised: 08/20/2018 Document Reviewed: 08/20/2018 Elsevier Patient Education  [place].

## 2020-11-14 NOTE — Telephone Encounter (Signed)
Scheduled appointments per 12/28 los. Spoke to patient's husband who is aware of appointments dates and times.

## 2020-11-15 ENCOUNTER — Ambulatory Visit: Payer: BC Managed Care – PPO

## 2020-11-15 ENCOUNTER — Inpatient Hospital Stay: Payer: BC Managed Care – PPO

## 2020-11-16 ENCOUNTER — Inpatient Hospital Stay: Payer: BC Managed Care – PPO

## 2020-11-16 ENCOUNTER — Other Ambulatory Visit: Payer: Self-pay

## 2020-11-16 VITALS — BP 150/79 | HR 100 | Temp 98.0°F | Resp 16

## 2020-11-16 DIAGNOSIS — Z86711 Personal history of pulmonary embolism: Secondary | ICD-10-CM | POA: Diagnosis not present

## 2020-11-16 DIAGNOSIS — Z5111 Encounter for antineoplastic chemotherapy: Secondary | ICD-10-CM | POA: Diagnosis not present

## 2020-11-16 DIAGNOSIS — Z808 Family history of malignant neoplasm of other organs or systems: Secondary | ICD-10-CM | POA: Diagnosis not present

## 2020-11-16 DIAGNOSIS — Z7901 Long term (current) use of anticoagulants: Secondary | ICD-10-CM | POA: Diagnosis not present

## 2020-11-16 DIAGNOSIS — Z5189 Encounter for other specified aftercare: Secondary | ICD-10-CM | POA: Diagnosis not present

## 2020-11-16 DIAGNOSIS — Z8701 Personal history of pneumonia (recurrent): Secondary | ICD-10-CM | POA: Diagnosis not present

## 2020-11-16 DIAGNOSIS — C5700 Malignant neoplasm of unspecified fallopian tube: Secondary | ICD-10-CM | POA: Diagnosis not present

## 2020-11-16 DIAGNOSIS — R200 Anesthesia of skin: Secondary | ICD-10-CM | POA: Diagnosis not present

## 2020-11-16 DIAGNOSIS — E039 Hypothyroidism, unspecified: Secondary | ICD-10-CM | POA: Diagnosis not present

## 2020-11-16 DIAGNOSIS — C5702 Malignant neoplasm of left fallopian tube: Secondary | ICD-10-CM

## 2020-11-16 DIAGNOSIS — F329 Major depressive disorder, single episode, unspecified: Secondary | ICD-10-CM | POA: Diagnosis not present

## 2020-11-16 MED ORDER — PEGFILGRASTIM-CBQV 6 MG/0.6ML ~~LOC~~ SOSY
PREFILLED_SYRINGE | SUBCUTANEOUS | Status: AC
  Filled 2020-11-16: qty 0.6

## 2020-11-16 MED ORDER — PEGFILGRASTIM-CBQV 6 MG/0.6ML ~~LOC~~ SOSY
6.0000 mg | PREFILLED_SYRINGE | Freq: Once | SUBCUTANEOUS | Status: AC
  Administered 2020-11-16: 6 mg via SUBCUTANEOUS

## 2020-11-16 NOTE — Patient Instructions (Signed)

## 2020-11-27 ENCOUNTER — Other Ambulatory Visit: Payer: Self-pay | Admitting: Internal Medicine

## 2020-12-01 ENCOUNTER — Encounter: Payer: Self-pay | Admitting: Oncology

## 2020-12-04 ENCOUNTER — Other Ambulatory Visit: Payer: Self-pay | Admitting: Oncology

## 2020-12-05 ENCOUNTER — Inpatient Hospital Stay: Payer: BC Managed Care – PPO | Attending: Oncology

## 2020-12-05 ENCOUNTER — Inpatient Hospital Stay: Payer: BC Managed Care – PPO

## 2020-12-05 ENCOUNTER — Inpatient Hospital Stay: Payer: BC Managed Care – PPO | Admitting: Oncology

## 2020-12-05 ENCOUNTER — Other Ambulatory Visit: Payer: Self-pay

## 2020-12-05 ENCOUNTER — Inpatient Hospital Stay (HOSPITAL_BASED_OUTPATIENT_CLINIC_OR_DEPARTMENT_OTHER): Payer: BC Managed Care – PPO | Admitting: Oncology

## 2020-12-05 VITALS — BP 150/84 | HR 90 | Temp 98.0°F | Resp 16 | Ht 63.0 in | Wt 181.2 lb

## 2020-12-05 DIAGNOSIS — C5702 Malignant neoplasm of left fallopian tube: Secondary | ICD-10-CM

## 2020-12-05 DIAGNOSIS — F329 Major depressive disorder, single episode, unspecified: Secondary | ICD-10-CM | POA: Insufficient documentation

## 2020-12-05 DIAGNOSIS — Z5189 Encounter for other specified aftercare: Secondary | ICD-10-CM | POA: Diagnosis not present

## 2020-12-05 DIAGNOSIS — Z7901 Long term (current) use of anticoagulants: Secondary | ICD-10-CM | POA: Diagnosis not present

## 2020-12-05 DIAGNOSIS — E039 Hypothyroidism, unspecified: Secondary | ICD-10-CM | POA: Diagnosis not present

## 2020-12-05 DIAGNOSIS — Z5111 Encounter for antineoplastic chemotherapy: Secondary | ICD-10-CM | POA: Insufficient documentation

## 2020-12-05 LAB — CMP (CANCER CENTER ONLY)
ALT: 20 U/L (ref 0–44)
AST: 20 U/L (ref 15–41)
Albumin: 3.9 g/dL (ref 3.5–5.0)
Alkaline Phosphatase: 89 U/L (ref 38–126)
Anion gap: 9 (ref 5–15)
BUN: 15 mg/dL (ref 8–23)
CO2: 25 mmol/L (ref 22–32)
Calcium: 9 mg/dL (ref 8.9–10.3)
Chloride: 106 mmol/L (ref 98–111)
Creatinine: 0.78 mg/dL (ref 0.44–1.00)
GFR, Estimated: >60 mL/min (ref >60)
Glucose, Bld: 98 mg/dL (ref 70–99)
Potassium: 3.9 mmol/L (ref 3.5–5.1)
Sodium: 140 mmol/L (ref 135–145)
Total Bilirubin: 0.4 mg/dL (ref 0.3–1.2)
Total Protein: 7.3 g/dL (ref 6.5–8.1)

## 2020-12-05 LAB — CBC WITH DIFFERENTIAL (CANCER CENTER ONLY)
Abs Immature Granulocytes: 0.02 10*3/uL (ref 0.00–0.07)
Basophils Absolute: 0 10*3/uL (ref 0.0–0.1)
Basophils Relative: 1 %
Eosinophils Absolute: 0 10*3/uL (ref 0.0–0.5)
Eosinophils Relative: 1 %
HCT: 26.3 % — ABNORMAL LOW (ref 36.0–46.0)
Hemoglobin: 8.5 g/dL — ABNORMAL LOW (ref 12.0–15.0)
Immature Granulocytes: 0 %
Lymphocytes Relative: 48 %
Lymphs Abs: 2.4 10*3/uL (ref 0.7–4.0)
MCH: 37.9 pg — ABNORMAL HIGH (ref 26.0–34.0)
MCHC: 32.3 g/dL (ref 30.0–36.0)
MCV: 117.4 fL — ABNORMAL HIGH (ref 80.0–100.0)
Monocytes Absolute: 0.6 10*3/uL (ref 0.1–1.0)
Monocytes Relative: 12 %
Neutro Abs: 2 10*3/uL (ref 1.7–7.7)
Neutrophils Relative %: 38 %
Platelet Count: 135 10*3/uL — ABNORMAL LOW (ref 150–400)
RBC: 2.24 MIL/uL — ABNORMAL LOW (ref 3.87–5.11)
RDW: 15.2 % (ref 11.5–15.5)
WBC Count: 5.2 10*3/uL (ref 4.0–10.5)
nRBC: 0 % (ref 0.0–0.2)

## 2020-12-05 MED ORDER — SODIUM CHLORIDE 0.9 % IV SOLN
140.0000 mg/m2 | Freq: Once | INTRAVENOUS | Status: AC
  Administered 2020-12-05: 258 mg via INTRAVENOUS
  Filled 2020-12-05: qty 43

## 2020-12-05 MED ORDER — HEPARIN SOD (PORK) LOCK FLUSH 100 UNIT/ML IV SOLN
500.0000 [IU] | Freq: Once | INTRAVENOUS | Status: AC | PRN
Start: 2020-12-05 — End: 2020-12-05
  Administered 2020-12-05: 500 [IU]
  Filled 2020-12-05: qty 5

## 2020-12-05 MED ORDER — SODIUM CHLORIDE 0.9 % IV SOLN
150.0000 mg | Freq: Once | INTRAVENOUS | Status: AC
  Administered 2020-12-05: 150 mg via INTRAVENOUS
  Filled 2020-12-05: qty 150

## 2020-12-05 MED ORDER — SODIUM CHLORIDE 0.9% FLUSH
10.0000 mL | INTRAVENOUS | Status: DC | PRN
  Administered 2020-12-05: 10 mL
  Filled 2020-12-05: qty 10

## 2020-12-05 MED ORDER — DIPHENHYDRAMINE HCL 50 MG/ML IJ SOLN
INTRAMUSCULAR | Status: AC
  Filled 2020-12-05: qty 1

## 2020-12-05 MED ORDER — PALONOSETRON HCL INJECTION 0.25 MG/5ML
INTRAVENOUS | Status: AC
  Filled 2020-12-05: qty 5

## 2020-12-05 MED ORDER — SODIUM CHLORIDE 0.9 % IV SOLN
340.0000 mg | Freq: Once | INTRAVENOUS | Status: AC
  Administered 2020-12-05: 340 mg via INTRAVENOUS
  Filled 2020-12-05: qty 34

## 2020-12-05 MED ORDER — SODIUM CHLORIDE 0.9 % IV SOLN
Freq: Once | INTRAVENOUS | Status: AC
  Filled 2020-12-05: qty 250

## 2020-12-05 MED ORDER — SODIUM CHLORIDE 0.9 % IV SOLN
10.0000 mg | Freq: Once | INTRAVENOUS | Status: AC
  Administered 2020-12-05: 10 mg via INTRAVENOUS
  Filled 2020-12-05: qty 10

## 2020-12-05 MED ORDER — FAMOTIDINE IN NACL 20-0.9 MG/50ML-% IV SOLN
20.0000 mg | Freq: Once | INTRAVENOUS | Status: AC
  Administered 2020-12-05: 20 mg via INTRAVENOUS

## 2020-12-05 MED ORDER — PALONOSETRON HCL INJECTION 0.25 MG/5ML
0.2500 mg | Freq: Once | INTRAVENOUS | Status: AC
  Administered 2020-12-05: 0.25 mg via INTRAVENOUS

## 2020-12-05 MED ORDER — FAMOTIDINE IN NACL 20-0.9 MG/50ML-% IV SOLN
INTRAVENOUS | Status: AC
  Filled 2020-12-05: qty 50

## 2020-12-05 MED ORDER — DIPHENHYDRAMINE HCL 50 MG/ML IJ SOLN
50.0000 mg | Freq: Once | INTRAMUSCULAR | Status: AC
  Administered 2020-12-05: 50 mg via INTRAVENOUS

## 2020-12-05 NOTE — Patient Instructions (Signed)
[facility] °Discharge Instructions for Patients Receiving Chemotherapy ° °Today you received the following chemotherapy agents Taxol; Carboplatin ° °To help prevent nausea and vomiting after your treatment, we encourage you to take your nausea medication as directed °  °If you develop nausea and vomiting that is not controlled by your nausea medication, call the clinic.  ° °BELOW ARE SYMPTOMS THAT SHOULD BE REPORTED IMMEDIATELY: °· *FEVER GREATER THAN 100.5 F °· *CHILLS WITH OR WITHOUT FEVER °· NAUSEA AND VOMITING THAT IS NOT CONTROLLED WITH YOUR NAUSEA MEDICATION °· *UNUSUAL SHORTNESS OF BREATH °· *UNUSUAL BRUISING OR BLEEDING °· TENDERNESS IN MOUTH AND THROAT WITH OR WITHOUT PRESENCE OF ULCERS °· *URINARY PROBLEMS °· *BOWEL PROBLEMS °· UNUSUAL RASH °Items with * indicate a potential emergency and should be followed up as soon as possible. ° °Feel free to call the clinic should you have any questions or concerns. The clinic phone number is [phone]. ° °Please show the CHEMO ALERT CARD at check-in to the Emergency Department and triage nurse. ° ° °

## 2020-12-05 NOTE — Progress Notes (Signed)
[facility] OFFICE PROGRESS NOTE   Diagnosis: Fallopian tube carcinoma  INTERVAL HISTORY:   Anna [name] completed another cycle of Taxol/carboplatin on 11/14/2020.  She has transient peripheral numbness following chemotherapy.  This has resolved.  No abdominal pain.  No nausea or vomiting.  No bleeding.  She broke a "tooth "and plans to see her dentist this week.  Objective:  Vital signs in last 24 hours:  Blood pressure (!) 150/84, pulse 90, temperature 98 F (36.7 C), temperature source Tympanic, resp. rate 16, height 5' 3"  (1.6 m), weight 181 lb 3.2 oz (82.2 kg), SpO2 100 %.    HEENT: No thrush or ulcers Resp: Lungs clear bilaterally Cardio: Regular rate and rhythm GI: No hepatomegaly, no mass, no apparent ascites Vascular: No leg edema    Portacath/PICC-without erythema  Lab Results:  Lab Results  Component Value Date   WBC 5.2 12/05/2020   HGB 8.5 (L) 12/05/2020   HCT 26.3 (L) 12/05/2020   MCV 117.4 (H) 12/05/2020   PLT 135 (L) 12/05/2020   NEUTROABS 2.0 12/05/2020    CMP  Lab Results  Component Value Date   NA 141 11/13/2020   K 3.8 11/13/2020   CL 106 11/13/2020   CO2 26 11/13/2020   GLUCOSE 98 11/13/2020   BUN 11 11/13/2020   CREATININE 0.82 11/13/2020   CALCIUM 8.8 (L) 11/13/2020   PROT 7.0 11/13/2020   ALBUMIN 3.5 11/13/2020   AST 19 11/13/2020   ALT 20 11/13/2020   ALKPHOS 85 11/13/2020   BILITOT 0.2 (L) 11/13/2020   GFRNONAA >60 11/13/2020   GFRAA >60 08/10/2020    Lab Results  Component Value Date   CEA1 <1.00 06/27/2017     Medications: I have reviewed the patient's current medications.   Assessment/Plan: 1. Left abdomen/pelvic pain ? CT abdomen/pelvis 06/26/2017-soft tissue implants in the lower anterior peritoneum with implants at the paracolic gutters and left pelvic sidewall ? NegativeMYRIADhereditary cancer panel ? Elevated CA 125 ? CT biopsy of anterior omental mass 07/04/2017-Metastatic carcinoma  consistent with a gynecologic primary ? 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 Cycle 1 adjuvant Taxol/carboplatin 08/05/2017 ? Cycle 2 adjuvant Taxol/carboplatin 08/26/2017 ? Cycle 3 adjuvant Taxol/carboplatin 09/17/2017-Taxol dose reduced secondary to neuropathy and bone pain ? Cycle 4 adjuvant Taxol/carboplatin 10/10/2017 ? Cycle 5 adjuvant Taxol/carboplatin 10/31/2017 ? Cycle 6 adjuvant Taxol/carboplatin 11/21/2017 ? CT abdomen/pelvis 08/08/2019-narrowing of rectosigmoid colon with eccentric serosal implant, 10 mm lymph node at the superior rectal vein, left upper quadrant soft tissue implant, 9 mm precaval node, 15 mm left internal iliac node, no ascites ? Cycle 1 Taxol/carboplatin 08/31/2019 ? Cycle 2 Taxol/carboplatin/Avastin 09/21/2019 ? Cycle 3 Taxol/Carboplatin 10/12/2019 (Avastin discontinued) ? Cycle 4 Taxol/carboplatin November 02, 2019 ? CT abdomen/pelvis 11/17/2019-decrease in size of previously prominent pericaval, left internal iliac, and superior rectal vein nodes, resolved rectosigmoid wall thickening ? Cycle 5 Taxol/carboplatin 11/22/2019 ? Cycle 6 Taxol/carboplatin 12/13/2019 ? Olaparib2/17/2021 ? CT abdomen/pelvis 07/10/2020-enlarging soft tissue at the distal left ureter, the ureter is not dilated, peritoneal implant adjacent to the splenic flexure, enlarged left superior rectal node, lymph nodes along the course of the distal IMV have enlarged, eccentric thickening of the sigmoid colon not seen  ? Leandro Reasoner  discontinued 07/12/2020 ? Cycle 1 Taxol/carboplatin 07/21/2020 ? Cycle 2 Taxol/carboplatin 08/10/2020  (carboplatin dose reduced secondary to thrombocytopenia) ? Cycle 3 Taxol/carboplatin 09/05/2020 ? Cycle 4 Taxol/carboplatin 09/26/2020 ? Cycle 5 Taxol/carboplatin 10/24/2020 ? CT abdomen/pelvis 11/13/2020-overall stable disease, no new site of metastatic disease, generally stable small abdominal/pelvic lymph nodes, slight enlargement of a peritoneal implant in the left pelvis, slight decrease in size of another pelvic implant ? Cycle 6 Taxol/carboplatin 11/14/2020 ? Cycle 7 Taxol/carboplatin 12/05/2020  2. Depression  3. Hypothyroid  4. Family history of uterine cancer-paternal grandmother  34.Acute onset dyspnea/pleuritic left-sided chest pain 10/02/2019  CT chest 10/04/2019-left lower lobe pulmonary emboli, multifocal pneumonia lower lobes  On Xarelto, status post course of Levaquin.    Disposition: Anna [name] appears stable.  She'll complete another treatment with Taxol/carboplatin today.  The CA125 appears to be slowly rising.  We will plan for a repeat CT within the next few months.  She will see her dentist this week to evaluate the broken tooth.  She will let the dentist know she is taking Xarelto anticoagulation.  [name], MD  12/05/2020  11:24 AM

## 2020-12-05 NOTE — Patient Instructions (Signed)

## 2020-12-06 ENCOUNTER — Telehealth: Payer: Self-pay | Admitting: Oncology

## 2020-12-06 ENCOUNTER — Inpatient Hospital Stay: Payer: BC Managed Care – PPO

## 2020-12-06 LAB — CA 125: Cancer Antigen (CA) 125: 49.2 U/mL — ABNORMAL HIGH (ref 0.0–38.1)

## 2020-12-06 NOTE — Telephone Encounter (Signed)
Scheduled appointments per 1/18 los. Called patient, no answer. Left message with appointments date and times.

## 2020-12-07 ENCOUNTER — Other Ambulatory Visit: Payer: Self-pay

## 2020-12-07 ENCOUNTER — Inpatient Hospital Stay: Payer: BC Managed Care – PPO

## 2020-12-07 VITALS — BP 140/77 | HR 90 | Temp 98.3°F | Resp 18

## 2020-12-07 DIAGNOSIS — Z7901 Long term (current) use of anticoagulants: Secondary | ICD-10-CM | POA: Diagnosis not present

## 2020-12-07 DIAGNOSIS — E039 Hypothyroidism, unspecified: Secondary | ICD-10-CM | POA: Diagnosis not present

## 2020-12-07 DIAGNOSIS — Z5111 Encounter for antineoplastic chemotherapy: Secondary | ICD-10-CM | POA: Diagnosis not present

## 2020-12-07 DIAGNOSIS — C5702 Malignant neoplasm of left fallopian tube: Secondary | ICD-10-CM

## 2020-12-07 DIAGNOSIS — Z5189 Encounter for other specified aftercare: Secondary | ICD-10-CM | POA: Diagnosis not present

## 2020-12-07 DIAGNOSIS — F329 Major depressive disorder, single episode, unspecified: Secondary | ICD-10-CM | POA: Diagnosis not present

## 2020-12-07 MED ORDER — PEGFILGRASTIM-CBQV 6 MG/0.6ML ~~LOC~~ SOSY
6.0000 mg | PREFILLED_SYRINGE | Freq: Once | SUBCUTANEOUS | Status: AC
  Administered 2020-12-07: 6 mg via SUBCUTANEOUS

## 2020-12-07 MED ORDER — PEGFILGRASTIM-CBQV 6 MG/0.6ML ~~LOC~~ SOSY
PREFILLED_SYRINGE | SUBCUTANEOUS | Status: AC
  Filled 2020-12-07: qty 0.6

## 2020-12-24 ENCOUNTER — Other Ambulatory Visit: Payer: Self-pay | Admitting: Oncology

## 2020-12-26 ENCOUNTER — Inpatient Hospital Stay: Payer: BC Managed Care – PPO

## 2020-12-26 ENCOUNTER — Other Ambulatory Visit: Payer: Self-pay

## 2020-12-26 ENCOUNTER — Inpatient Hospital Stay (HOSPITAL_BASED_OUTPATIENT_CLINIC_OR_DEPARTMENT_OTHER): Payer: BC Managed Care – PPO | Admitting: Oncology

## 2020-12-26 ENCOUNTER — Encounter: Payer: Self-pay | Admitting: Oncology

## 2020-12-26 ENCOUNTER — Inpatient Hospital Stay: Payer: BC Managed Care – PPO | Attending: Oncology

## 2020-12-26 VITALS — BP 149/78 | HR 105 | Temp 98.8°F | Resp 16 | Ht 63.0 in | Wt 180.2 lb

## 2020-12-26 VITALS — HR 99

## 2020-12-26 DIAGNOSIS — Z95828 Presence of other vascular implants and grafts: Secondary | ICD-10-CM

## 2020-12-26 DIAGNOSIS — Z90710 Acquired absence of both cervix and uterus: Secondary | ICD-10-CM | POA: Diagnosis not present

## 2020-12-26 DIAGNOSIS — E039 Hypothyroidism, unspecified: Secondary | ICD-10-CM | POA: Insufficient documentation

## 2020-12-26 DIAGNOSIS — C5702 Malignant neoplasm of left fallopian tube: Secondary | ICD-10-CM

## 2020-12-26 DIAGNOSIS — Z7901 Long term (current) use of anticoagulants: Secondary | ICD-10-CM | POA: Insufficient documentation

## 2020-12-26 DIAGNOSIS — Z90722 Acquired absence of ovaries, bilateral: Secondary | ICD-10-CM | POA: Insufficient documentation

## 2020-12-26 DIAGNOSIS — Z79899 Other long term (current) drug therapy: Secondary | ICD-10-CM | POA: Diagnosis not present

## 2020-12-26 DIAGNOSIS — F329 Major depressive disorder, single episode, unspecified: Secondary | ICD-10-CM | POA: Diagnosis not present

## 2020-12-26 DIAGNOSIS — Z5111 Encounter for antineoplastic chemotherapy: Secondary | ICD-10-CM | POA: Insufficient documentation

## 2020-12-26 LAB — CMP (CANCER CENTER ONLY)
ALT: 23 U/L (ref 0–44)
AST: 23 U/L (ref 15–41)
Albumin: 3.7 g/dL (ref 3.5–5.0)
Alkaline Phosphatase: 97 U/L (ref 38–126)
Anion gap: 7 (ref 5–15)
BUN: 18 mg/dL (ref 8–23)
CO2: 24 mmol/L (ref 22–32)
Calcium: 8.9 mg/dL (ref 8.9–10.3)
Chloride: 107 mmol/L (ref 98–111)
Creatinine: 0.83 mg/dL (ref 0.44–1.00)
GFR, Estimated: >60 mL/min (ref >60)
Glucose, Bld: 104 mg/dL — ABNORMAL HIGH (ref 70–99)
Potassium: 3.9 mmol/L (ref 3.5–5.1)
Sodium: 138 mmol/L (ref 135–145)
Total Bilirubin: <0.2 mg/dL — ABNORMAL LOW (ref 0.3–1.2)
Total Protein: 7.1 g/dL (ref 6.5–8.1)

## 2020-12-26 LAB — CBC WITH DIFFERENTIAL (CANCER CENTER ONLY)
Abs Immature Granulocytes: 0.03 10*3/uL (ref 0.00–0.07)
Basophils Absolute: 0 10*3/uL (ref 0.0–0.1)
Basophils Relative: 0 %
Eosinophils Absolute: 0 10*3/uL (ref 0.0–0.5)
Eosinophils Relative: 1 %
HCT: 26.6 % — ABNORMAL LOW (ref 36.0–46.0)
Hemoglobin: 8.8 g/dL — ABNORMAL LOW (ref 12.0–15.0)
Immature Granulocytes: 1 %
Lymphocytes Relative: 41 %
Lymphs Abs: 2.1 10*3/uL (ref 0.7–4.0)
MCH: 37.9 pg — ABNORMAL HIGH (ref 26.0–34.0)
MCHC: 33.1 g/dL (ref 30.0–36.0)
MCV: 114.7 fL — ABNORMAL HIGH (ref 80.0–100.0)
Monocytes Absolute: 0.8 10*3/uL (ref 0.1–1.0)
Monocytes Relative: 15 %
Neutro Abs: 2.2 10*3/uL (ref 1.7–7.7)
Neutrophils Relative %: 42 %
Platelet Count: 143 10*3/uL — ABNORMAL LOW (ref 150–400)
RBC: 2.32 MIL/uL — ABNORMAL LOW (ref 3.87–5.11)
RDW: 14.6 % (ref 11.5–15.5)
WBC Count: 5.1 10*3/uL (ref 4.0–10.5)
nRBC: 0 % (ref 0.0–0.2)

## 2020-12-26 MED ORDER — SODIUM CHLORIDE 0.9 % IV SOLN
150.0000 mg | Freq: Once | INTRAVENOUS | Status: AC
  Administered 2020-12-26: 150 mg via INTRAVENOUS
  Filled 2020-12-26: qty 150

## 2020-12-26 MED ORDER — FAMOTIDINE IN NACL 20-0.9 MG/50ML-% IV SOLN
20.0000 mg | Freq: Once | INTRAVENOUS | Status: AC
  Administered 2020-12-26: 20 mg via INTRAVENOUS

## 2020-12-26 MED ORDER — PALONOSETRON HCL INJECTION 0.25 MG/5ML
INTRAVENOUS | Status: AC
  Filled 2020-12-26: qty 5

## 2020-12-26 MED ORDER — SODIUM CHLORIDE 0.9% FLUSH
10.0000 mL | INTRAVENOUS | Status: DC | PRN
  Administered 2020-12-26: 10 mL
  Filled 2020-12-26: qty 10

## 2020-12-26 MED ORDER — DIPHENHYDRAMINE HCL 50 MG/ML IJ SOLN
INTRAMUSCULAR | Status: AC
  Filled 2020-12-26: qty 1

## 2020-12-26 MED ORDER — DIPHENHYDRAMINE HCL 50 MG/ML IJ SOLN
50.0000 mg | Freq: Once | INTRAMUSCULAR | Status: AC
  Administered 2020-12-26: 50 mg via INTRAVENOUS

## 2020-12-26 MED ORDER — SODIUM CHLORIDE 0.9 % IV SOLN
340.0000 mg | Freq: Once | INTRAVENOUS | Status: AC
  Administered 2020-12-26: 340 mg via INTRAVENOUS
  Filled 2020-12-26: qty 34

## 2020-12-26 MED ORDER — FAMOTIDINE IN NACL 20-0.9 MG/50ML-% IV SOLN
INTRAVENOUS | Status: AC
  Filled 2020-12-26: qty 50

## 2020-12-26 MED ORDER — HEPARIN SOD (PORK) LOCK FLUSH 100 UNIT/ML IV SOLN
500.0000 [IU] | Freq: Once | INTRAVENOUS | Status: AC | PRN
  Administered 2020-12-26: 500 [IU]
  Filled 2020-12-26: qty 5

## 2020-12-26 MED ORDER — SODIUM CHLORIDE 0.9 % IV SOLN
140.0000 mg/m2 | Freq: Once | INTRAVENOUS | Status: AC
  Administered 2020-12-26: 258 mg via INTRAVENOUS
  Filled 2020-12-26: qty 43

## 2020-12-26 MED ORDER — PALONOSETRON HCL INJECTION 0.25 MG/5ML
0.2500 mg | Freq: Once | INTRAVENOUS | Status: AC
  Administered 2020-12-26: 0.25 mg via INTRAVENOUS

## 2020-12-26 MED ORDER — SODIUM CHLORIDE 0.9 % IV SOLN
10.0000 mg | Freq: Once | INTRAVENOUS | Status: AC
  Administered 2020-12-26: 10 mg via INTRAVENOUS
  Filled 2020-12-26: qty 10

## 2020-12-26 MED ORDER — SODIUM CHLORIDE 0.9 % IV SOLN
Freq: Once | INTRAVENOUS | Status: AC
  Filled 2020-12-26: qty 250

## 2020-12-26 NOTE — Patient Instructions (Addendum)
[facility] Discharge Instructions for Patients Receiving Chemotherapy  Today you received the following chemotherapy agents: Paclitaxel (TAXOL) & Carboplatin (PARAPLATIN).  To help prevent nausea and vomiting after your treatment, we encourage you to take your nausea medication as directed.   If you develop nausea and vomiting that is not controlled by your nausea medication, call the clinic.   BELOW ARE SYMPTOMS THAT SHOULD BE REPORTED IMMEDIATELY:  *FEVER GREATER THAN 100.5 F  *CHILLS WITH OR WITHOUT FEVER  NAUSEA AND VOMITING THAT IS NOT CONTROLLED WITH YOUR NAUSEA MEDICATION  *UNUSUAL SHORTNESS OF BREATH  *UNUSUAL BRUISING OR BLEEDING  TENDERNESS IN MOUTH AND THROAT WITH OR WITHOUT PRESENCE OF ULCERS  *URINARY PROBLEMS  *BOWEL PROBLEMS  UNUSUAL RASH Items with * indicate a potential emergency and should be followed up as soon as possible.  Feel free to call the clinic should you have any questions or concerns. The clinic phone number is [phone].  Please show the [place] [place] at check-in to the Emergency Department and triage nurse.

## 2020-12-26 NOTE — Patient Instructions (Signed)
Implanted Port Insertion, Care After This sheet gives you information about how to care for yourself after your procedure. Your health care provider may also give you more specific instructions. If you have problems or questions, contact your health care provider. What can I expect after the procedure? After the procedure, it is common to have:  Discomfort at the port insertion site.  Bruising on the skin over the port. This should improve over 3-4 days. Follow these instructions at home: Port care  After your port is placed, you will get a manufacturer's information card. The card has information about your port. Keep this card with you at all times.  Take care of the port as told by your health care provider. Ask your health care provider if you or a family member can get training for taking care of the port at home. A home health care nurse may also take care of the port.  Make sure to remember what type of port you have. Incision care  Follow instructions from your health care provider about how to take care of your port insertion site. Make sure you: ? Wash your hands with soap and water before and after you change your bandage (dressing). If soap and water are not available, use hand sanitizer. ? Change your dressing as told by your health care provider. ? Leave stitches (sutures), skin glue, or adhesive strips in place. These skin closures may need to stay in place for 2 weeks or longer. If adhesive strip edges start to loosen and curl up, you may trim the loose edges. Do not remove adhesive strips completely unless your health care provider tells you to do that.  Check your port insertion site every day for signs of infection. Check for: ? Redness, swelling, or pain. ? Fluid or blood. ? Warmth. ? Pus or a bad smell.      Activity  Return to your normal activities as told by your health care provider. Ask your health care provider what activities are safe for you.  Do not  lift anything that is heavier than 10 lb (4.5 kg), or the limit that you are told, until your health care provider says that it is safe. General instructions  Take over-the-counter and prescription medicines only as told by your health care provider.  Do not take baths, swim, or use a hot tub until your health care provider approves. Ask your health care provider if you may take showers. You may only be allowed to take sponge baths.  Do not drive for 24 hours if you were given a sedative during your procedure.  Wear a medical alert bracelet in case of an emergency. This will tell any health care providers that you have a port.  Keep all follow-up visits as told by your health care provider. This is important. Contact a health care provider if:  You cannot flush your port with saline as directed, or you cannot draw blood from the port.  You have a fever or chills.  You have redness, swelling, or pain around your port insertion site.  You have fluid or blood coming from your port insertion site.  Your port insertion site feels warm to the touch.  You have pus or a bad smell coming from the port insertion site. Get help right away if:  You have chest pain or shortness of breath.  You have bleeding from your port that you cannot control. Summary  Take care of the port as told by your   health care provider. Keep the manufacturer's information card with you at all times.  Change your dressing as told by your health care provider.  Contact a health care provider if you have a fever or chills or if you have redness, swelling, or pain around your port insertion site.  Keep all follow-up visits as told by your health care provider. This information is not intended to replace advice given to you by your health care provider. Make sure you discuss any questions you have with your health care provider. Document Revised: 06/02/2018 Document Reviewed: 06/02/2018 Elsevier Patient Education   2021 Elsevier Inc.  

## 2020-12-26 NOTE — Progress Notes (Signed)
[facility] OFFICE PROGRESS NOTE   Diagnosis: Fallopian tube carcinoma  INTERVAL HISTORY:   Anna [name] completed another cycle of Taxol/carboplatin on 12/05/2020.  No nausea/vomiting.  No difficulty with bowel function.  She feels well.  No abdominal pain.  She has peripheral numbness lasting for 2 to 3 days following chemotherapy.  This has resolved.  Objective:  Vital signs in last 24 hours:  Blood pressure (!) 149/78, pulse (!) 105, temperature 98.8 F (37.1 C), temperature source Tympanic, resp. rate 16, height 5' 3"  (1.6 m), weight 180 lb 3.2 oz (81.7 kg), SpO2 99 %.    HEENT: No thrush or ulcers Resp: Lungs clear bilaterally Cardio: Regular rate and rhythm GI: No hepatosplenomegaly, no apparent ascites, no mass, nontender Vascular: No leg edema  Portacath/PICC-without erythema  Lab Results:  Lab Results  Component Value Date   WBC 5.1 12/26/2020   HGB 8.8 (L) 12/26/2020   HCT 26.6 (L) 12/26/2020   MCV 114.7 (H) 12/26/2020   PLT 143 (L) 12/26/2020   NEUTROABS 2.2 12/26/2020    CMP  Lab Results  Component Value Date   NA 138 12/26/2020   K 3.9 12/26/2020   CL 107 12/26/2020   CO2 24 12/26/2020   GLUCOSE 104 (H) 12/26/2020   BUN 18 12/26/2020   CREATININE 0.83 12/26/2020   CALCIUM 8.9 12/26/2020   PROT 7.1 12/26/2020   ALBUMIN 3.7 12/26/2020   AST 23 12/26/2020   ALT 23 12/26/2020   ALKPHOS 97 12/26/2020   BILITOT <0.2 (L) 12/26/2020   GFRNONAA >60 12/26/2020   GFRAA >60 08/10/2020     Medications: I have reviewed the patient's current medications.   Assessment/Plan: 1. Left abdomen/pelvic pain ? CT abdomen/pelvis 06/26/2017-soft tissue implants in the lower anterior peritoneum with implants at the paracolic gutters and left pelvic sidewall ? NegativeMYRIADhereditary cancer panel ? Elevated CA 125 ? CT biopsy of anterior omental mass 07/04/2017-Metastatic carcinoma consistent with a gynecologic primary ? 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 Cycle 1 adjuvant Taxol/carboplatin 08/05/2017 ? Cycle 2 adjuvant Taxol/carboplatin 08/26/2017 ? Cycle 3 adjuvant Taxol/carboplatin 09/17/2017-Taxol dose reduced secondary to neuropathy and bone pain ? Cycle 4 adjuvant Taxol/carboplatin 10/10/2017 ? Cycle 5 adjuvant Taxol/carboplatin 10/31/2017 ? Cycle 6 adjuvant Taxol/carboplatin 11/21/2017 ? CT abdomen/pelvis 08/08/2019-narrowing of rectosigmoid colon with eccentric serosal implant, 10 mm lymph node at the superior rectal vein, left upper quadrant soft tissue implant, 9 mm precaval node, 15 mm left internal iliac node, no ascites ? Cycle 1 Taxol/carboplatin 08/31/2019 ? Cycle 2 Taxol/carboplatin/Avastin 09/21/2019 ? Cycle 3 Taxol/Carboplatin 10/12/2019 (Avastin discontinued) ? Cycle 4 Taxol/carboplatin November 02, 2019 ? CT abdomen/pelvis 11/17/2019-decrease in size of previously prominent pericaval, left internal iliac, and superior rectal vein nodes, resolved rectosigmoid wall thickening ? Cycle 5 Taxol/carboplatin 11/22/2019 ? Cycle 6 Taxol/carboplatin 12/13/2019 ? Olaparib2/17/2021 ? CT abdomen/pelvis 07/10/2020-enlarging soft tissue at the distal left ureter, the ureter is not dilated, peritoneal implant adjacent to the splenic flexure, enlarged left superior rectal node, lymph nodes along the course of the distal IMV have enlarged, eccentric thickening of the sigmoid colon not seen  ? Olaraparib discontinued 07/12/2020 ? Cycle 1 Taxol/carboplatin 07/21/2020 ? Cycle 2 Taxol/carboplatin 08/10/2020 (carboplatin dose reduced secondary to  thrombocytopenia) ? Cycle 3 Taxol/carboplatin 09/05/2020 ? Cycle 4 Taxol/carboplatin 09/26/2020 ? Cycle 5 Taxol/carboplatin 10/24/2020 ? CT abdomen/pelvis 11/13/2020-overall stable disease, no new site of metastatic disease, generally stable small abdominal/pelvic lymph nodes, slight enlargement of a peritoneal implant in the left pelvis, slight decrease in size of another pelvic implant ? Cycle 6 Taxol/carboplatin 11/14/2020 ? Cycle 7 Taxol/carboplatin 12/05/2020 ? Cycle 8 Taxol/carboplatin 12/26/2020  2. Depression  3. Hypothyroid  4. Family history of uterine cancer-paternal grandmother  50.Acute onset dyspnea/pleuritic left-sided chest pain 10/02/2019  CT chest 10/04/2019-left lower lobe pulmonary emboli, multifocal pneumonia lower lobes  On Xarelto, status post course of Levaquin.     Disposition: Anna [name] appears stable.  She is tolerating the Taxol/carboplatin well.  The CA-125 was stable when she was here 3 weeks ago.  We will follow up on the CA-125 today.  She will complete another cycle of Taxol/carboplatin today.  Anna [name] will return for an office visit and chemotherapy in 3 weeks.  [name], MD  12/26/2020  11:10 AM

## 2020-12-26 NOTE — Progress Notes (Signed)
Completed the [employer] Patient Assistance application for Xarelto and faxed for processing.  I will notify the pt of the outcome once I receive it.

## 2020-12-27 ENCOUNTER — Inpatient Hospital Stay: Payer: BC Managed Care – PPO

## 2020-12-27 LAB — CA 125: Cancer Antigen (CA) 125: 54.2 U/mL — ABNORMAL HIGH (ref 0.0–38.1)

## 2020-12-28 ENCOUNTER — Other Ambulatory Visit: Payer: Self-pay

## 2020-12-28 ENCOUNTER — Inpatient Hospital Stay: Payer: BC Managed Care – PPO

## 2020-12-28 VITALS — BP 129/79 | HR 104 | Temp 98.3°F | Resp 16

## 2020-12-28 DIAGNOSIS — Z90722 Acquired absence of ovaries, bilateral: Secondary | ICD-10-CM | POA: Diagnosis not present

## 2020-12-28 DIAGNOSIS — E039 Hypothyroidism, unspecified: Secondary | ICD-10-CM | POA: Diagnosis not present

## 2020-12-28 DIAGNOSIS — Z79899 Other long term (current) drug therapy: Secondary | ICD-10-CM | POA: Diagnosis not present

## 2020-12-28 DIAGNOSIS — C5702 Malignant neoplasm of left fallopian tube: Secondary | ICD-10-CM

## 2020-12-28 DIAGNOSIS — Z7901 Long term (current) use of anticoagulants: Secondary | ICD-10-CM | POA: Diagnosis not present

## 2020-12-28 DIAGNOSIS — Z90710 Acquired absence of both cervix and uterus: Secondary | ICD-10-CM | POA: Diagnosis not present

## 2020-12-28 DIAGNOSIS — F329 Major depressive disorder, single episode, unspecified: Secondary | ICD-10-CM | POA: Diagnosis not present

## 2020-12-28 DIAGNOSIS — Z5111 Encounter for antineoplastic chemotherapy: Secondary | ICD-10-CM | POA: Diagnosis not present

## 2020-12-28 MED ORDER — PEGFILGRASTIM-CBQV 6 MG/0.6ML ~~LOC~~ SOSY
PREFILLED_SYRINGE | SUBCUTANEOUS | Status: AC
  Filled 2020-12-28: qty 0.6

## 2020-12-28 MED ORDER — PEGFILGRASTIM-CBQV 6 MG/0.6ML ~~LOC~~ SOSY
6.0000 mg | PREFILLED_SYRINGE | Freq: Once | SUBCUTANEOUS | Status: AC
Start: 2020-12-28 — End: 2020-12-28
  Administered 2020-12-28: 6 mg via SUBCUTANEOUS

## 2021-01-03 ENCOUNTER — Encounter: Payer: Self-pay | Admitting: Oncology

## 2021-01-03 NOTE — Progress Notes (Signed)
Pt was approved w/ Anna [name] & Anna [name] to receive Xarelto every month without being charged a copay or additional fees for up to one year.

## 2021-01-14 ENCOUNTER — Other Ambulatory Visit: Payer: Self-pay | Admitting: Oncology

## 2021-01-16 ENCOUNTER — Inpatient Hospital Stay (HOSPITAL_BASED_OUTPATIENT_CLINIC_OR_DEPARTMENT_OTHER): Payer: BC Managed Care – PPO | Admitting: Oncology

## 2021-01-16 ENCOUNTER — Other Ambulatory Visit: Payer: Self-pay

## 2021-01-16 ENCOUNTER — Inpatient Hospital Stay: Payer: BC Managed Care – PPO

## 2021-01-16 ENCOUNTER — Inpatient Hospital Stay: Payer: BC Managed Care – PPO | Attending: Oncology

## 2021-01-16 VITALS — BP 127/81 | HR 105 | Temp 98.0°F | Resp 18 | Ht 63.0 in | Wt 180.2 lb

## 2021-01-16 VITALS — HR 85

## 2021-01-16 DIAGNOSIS — Z86711 Personal history of pulmonary embolism: Secondary | ICD-10-CM | POA: Insufficient documentation

## 2021-01-16 DIAGNOSIS — C5702 Malignant neoplasm of left fallopian tube: Secondary | ICD-10-CM | POA: Diagnosis not present

## 2021-01-16 DIAGNOSIS — Z5111 Encounter for antineoplastic chemotherapy: Secondary | ICD-10-CM | POA: Insufficient documentation

## 2021-01-16 DIAGNOSIS — E039 Hypothyroidism, unspecified: Secondary | ICD-10-CM | POA: Diagnosis not present

## 2021-01-16 DIAGNOSIS — C5700 Malignant neoplasm of unspecified fallopian tube: Secondary | ICD-10-CM

## 2021-01-16 DIAGNOSIS — R112 Nausea with vomiting, unspecified: Secondary | ICD-10-CM | POA: Insufficient documentation

## 2021-01-16 DIAGNOSIS — Z7901 Long term (current) use of anticoagulants: Secondary | ICD-10-CM | POA: Diagnosis not present

## 2021-01-16 DIAGNOSIS — Z95828 Presence of other vascular implants and grafts: Secondary | ICD-10-CM

## 2021-01-16 LAB — CBC WITH DIFFERENTIAL (CANCER CENTER ONLY)
Abs Immature Granulocytes: 0.03 10*3/uL (ref 0.00–0.07)
Basophils Absolute: 0 10*3/uL (ref 0.0–0.1)
Basophils Relative: 0 %
Eosinophils Absolute: 0 10*3/uL (ref 0.0–0.5)
Eosinophils Relative: 1 %
HCT: 27.8 % — ABNORMAL LOW (ref 36.0–46.0)
Hemoglobin: 8.9 g/dL — ABNORMAL LOW (ref 12.0–15.0)
Immature Granulocytes: 1 %
Lymphocytes Relative: 31 %
Lymphs Abs: 1.6 10*3/uL (ref 0.7–4.0)
MCH: 36.9 pg — ABNORMAL HIGH (ref 26.0–34.0)
MCHC: 32 g/dL (ref 30.0–36.0)
MCV: 115.4 fL — ABNORMAL HIGH (ref 80.0–100.0)
Monocytes Absolute: 0.7 10*3/uL (ref 0.1–1.0)
Monocytes Relative: 15 %
Neutro Abs: 2.7 10*3/uL (ref 1.7–7.7)
Neutrophils Relative %: 52 %
Platelet Count: 153 10*3/uL (ref 150–400)
RBC: 2.41 MIL/uL — ABNORMAL LOW (ref 3.87–5.11)
RDW: 14.4 % (ref 11.5–15.5)
WBC Count: 5.1 10*3/uL (ref 4.0–10.5)
nRBC: 0 % (ref 0.0–0.2)

## 2021-01-16 LAB — CMP (CANCER CENTER ONLY)
ALT: 22 U/L (ref 0–44)
AST: 19 U/L (ref 15–41)
Albumin: 3.7 g/dL (ref 3.5–5.0)
Alkaline Phosphatase: 95 U/L (ref 38–126)
Anion gap: 12 (ref 5–15)
BUN: 16 mg/dL (ref 8–23)
CO2: 22 mmol/L (ref 22–32)
Calcium: 8.9 mg/dL (ref 8.9–10.3)
Chloride: 107 mmol/L (ref 98–111)
Creatinine: 0.92 mg/dL (ref 0.44–1.00)
GFR, Estimated: >60 mL/min (ref >60)
Glucose, Bld: 126 mg/dL — ABNORMAL HIGH (ref 70–99)
Potassium: 3.6 mmol/L (ref 3.5–5.1)
Sodium: 141 mmol/L (ref 135–145)
Total Bilirubin: 0.3 mg/dL (ref 0.3–1.2)
Total Protein: 7.2 g/dL (ref 6.5–8.1)

## 2021-01-16 MED ORDER — SODIUM CHLORIDE 0.9 % IV SOLN
140.0000 mg/m2 | Freq: Once | INTRAVENOUS | Status: AC
  Administered 2021-01-16: 258 mg via INTRAVENOUS
  Filled 2021-01-16: qty 43

## 2021-01-16 MED ORDER — DIPHENHYDRAMINE HCL 50 MG/ML IJ SOLN
50.0000 mg | Freq: Once | INTRAMUSCULAR | Status: AC
  Administered 2021-01-16: 50 mg via INTRAVENOUS

## 2021-01-16 MED ORDER — FAMOTIDINE IN NACL 20-0.9 MG/50ML-% IV SOLN
20.0000 mg | Freq: Once | INTRAVENOUS | Status: AC
  Administered 2021-01-16: 20 mg via INTRAVENOUS

## 2021-01-16 MED ORDER — SODIUM CHLORIDE 0.9 % IV SOLN
340.0000 mg | Freq: Once | INTRAVENOUS | Status: AC
  Administered 2021-01-16: 340 mg via INTRAVENOUS
  Filled 2021-01-16: qty 34

## 2021-01-16 MED ORDER — CARBOPLATIN CHEMO INJECTION 450 MG/45ML: 340.0000 mg | Freq: Once | INTRAVENOUS | Status: DC

## 2021-01-16 MED ORDER — PALONOSETRON HCL INJECTION 0.25 MG/5ML
INTRAVENOUS | Status: AC
  Filled 2021-01-16: qty 5

## 2021-01-16 MED ORDER — SODIUM CHLORIDE 0.9% FLUSH
10.0000 mL | INTRAVENOUS | Status: DC | PRN
  Administered 2021-01-16: 10 mL
  Filled 2021-01-16: qty 10

## 2021-01-16 MED ORDER — SODIUM CHLORIDE 0.9 % IV SOLN
Freq: Once | INTRAVENOUS | Status: AC
  Filled 2021-01-16: qty 250

## 2021-01-16 MED ORDER — HEPARIN SOD (PORK) LOCK FLUSH 100 UNIT/ML IV SOLN
500.0000 [IU] | Freq: Once | INTRAVENOUS | Status: AC | PRN
Start: 2021-01-16 — End: 2021-01-16
  Administered 2021-01-16: 500 [IU]
  Filled 2021-01-16: qty 5

## 2021-01-16 MED ORDER — SODIUM CHLORIDE 0.9 % IV SOLN
150.0000 mg | Freq: Once | INTRAVENOUS | Status: AC
  Administered 2021-01-16: 150 mg via INTRAVENOUS
  Filled 2021-01-16: qty 150

## 2021-01-16 MED ORDER — DIPHENHYDRAMINE HCL 50 MG/ML IJ SOLN
INTRAMUSCULAR | Status: AC
  Filled 2021-01-16: qty 1

## 2021-01-16 MED ORDER — PALONOSETRON HCL INJECTION 0.25 MG/5ML
0.2500 mg | Freq: Once | INTRAVENOUS | Status: AC
  Administered 2021-01-16: 0.25 mg via INTRAVENOUS

## 2021-01-16 MED ORDER — FAMOTIDINE IN NACL 20-0.9 MG/50ML-% IV SOLN
INTRAVENOUS | Status: AC
  Filled 2021-01-16: qty 50

## 2021-01-16 MED ORDER — SODIUM CHLORIDE 0.9 % IV SOLN
10.0000 mg | Freq: Once | INTRAVENOUS | Status: AC
  Administered 2021-01-16: 10 mg via INTRAVENOUS
  Filled 2021-01-16: qty 10

## 2021-01-16 NOTE — Progress Notes (Signed)
[facility] OFFICE PROGRESS NOTE   Diagnosis: Fallopian tube carcinoma  INTERVAL HISTORY:   Anna [name] completed another cycle of Taxol/carboplatin on 12/26/2020.  She reports numbness in the fingers and toes lasting 2 to 3 days following chemotherapy.  No nausea/vomiting, mouth sores, or pain.  No bleeding or symptom of thrombosis.  She had nausea when getting out of the shower today.  She now feels better.  Objective:  Vital signs in last 24 hours:  Blood pressure 127/81, pulse (!) 105, temperature 98 F (36.7 C), temperature source Tympanic, resp. rate 18, height 5' 3"  (1.6 m), weight 180 lb 3.2 oz (81.7 kg), SpO2 100 %.    HEENT: No thrush or ulcers Resp: Lungs clear bilaterally Cardio: Regular rate and rhythm GI: No hepatosplenomegaly, no apparent ascites, no mass, nontender Vascular: No leg edema   Portacath/PICC-without erythema  Lab Results:  Lab Results  Component Value Date   WBC 5.1 01/16/2021   HGB 8.9 (L) 01/16/2021   HCT 27.8 (L) 01/16/2021   MCV 115.4 (H) 01/16/2021   PLT 153 01/16/2021   NEUTROABS 2.7 01/16/2021    CMP  Lab Results  Component Value Date   NA 141 01/16/2021   K 3.6 01/16/2021   CL 107 01/16/2021   CO2 22 01/16/2021   GLUCOSE 126 (H) 01/16/2021   BUN 16 01/16/2021   CREATININE 0.92 01/16/2021   CALCIUM 8.9 01/16/2021   PROT 7.2 01/16/2021   ALBUMIN 3.7 01/16/2021   AST 19 01/16/2021   ALT 22 01/16/2021   ALKPHOS 95 01/16/2021   BILITOT 0.3 01/16/2021   GFRNONAA >60 01/16/2021   GFRAA >60 08/10/2020  CA-125  12/26/2020-54.2   Medications: I have reviewed the patient's current medications.   Assessment/Plan: 1. Left abdomen/pelvic pain ? CT abdomen/pelvis 06/26/2017-soft tissue implants in the lower anterior peritoneum with implants at the paracolic gutters and left pelvic sidewall ? NegativeMYRIADhereditary cancer panel ? Elevated CA 125 ? CT biopsy of anterior omental mass 07/04/2017-Metastatic  carcinoma consistent with a gynecologic primary ? 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 Cycle 1 adjuvant Taxol/carboplatin 08/05/2017 ? Cycle 2 adjuvant Taxol/carboplatin 08/26/2017 ? Cycle 3 adjuvant Taxol/carboplatin 09/17/2017-Taxol dose reduced secondary to neuropathy and bone pain ? Cycle 4 adjuvant Taxol/carboplatin 10/10/2017 ? Cycle 5 adjuvant Taxol/carboplatin 10/31/2017 ? Cycle 6 adjuvant Taxol/carboplatin 11/21/2017 ? CT abdomen/pelvis 08/08/2019-narrowing of rectosigmoid colon with eccentric serosal implant, 10 mm lymph node at the superior rectal vein, left upper quadrant soft tissue implant, 9 mm precaval node, 15 mm left internal iliac node, no ascites ? Cycle 1 Taxol/carboplatin 08/31/2019 ? Cycle 2 Taxol/carboplatin/Avastin 09/21/2019 ? Cycle 3 Taxol/Carboplatin 10/12/2019 (Avastin discontinued) ? Cycle 4 Taxol/carboplatin November 02, 2019 ? CT abdomen/pelvis 11/17/2019-decrease in size of previously prominent pericaval, left internal iliac, and superior rectal vein nodes, resolved rectosigmoid wall thickening ? Cycle 5 Taxol/carboplatin 11/22/2019 ? Cycle 6 Taxol/carboplatin 12/13/2019 ? Olaparib2/17/2021 ? CT abdomen/pelvis 07/10/2020-enlarging soft tissue at the distal left ureter, the ureter is not dilated, peritoneal implant adjacent to the splenic flexure, enlarged left superior rectal node, lymph nodes along the course of the distal IMV have enlarged, eccentric thickening of the sigmoid colon not seen  ? Olaraparib discontinued 07/12/2020 ? Cycle 1  Taxol/carboplatin 07/21/2020 ? Cycle 2 Taxol/carboplatin  08/10/2020 (carboplatin dose reduced secondary to thrombocytopenia) ? Cycle 3 Taxol/carboplatin 09/05/2020 ? Cycle 4 Taxol/carboplatin 09/26/2020 ? Cycle 5 Taxol/carboplatin 10/24/2020 ? CT abdomen/pelvis 11/13/2020-overall stable disease, no new site of metastatic disease, generally stable small abdominal/pelvic lymph nodes, slight enlargement of a peritoneal implant in the left pelvis, slight decrease in size of another pelvic implant ? Cycle 6 Taxol/carboplatin 11/14/2020 ? Cycle 7 Taxol/carboplatin 12/05/2020 ? Cycle 8 Taxol/carboplatin 12/26/2020 ? Cycle 9 Taxol/carboplatin 01/16/2021  2. Depression  3. Hypothyroid  4. Family history of uterine cancer-paternal grandmother  16.Acute onset dyspnea/pleuritic left-sided chest pain 10/02/2019  CT chest 10/04/2019-left lower lobe pulmonary emboli, multifocal pneumonia lower lobes  On Xarelto, status post course of Levaquin.       Disposition: Anna [name] appears stable.  There is no clinical evidence of disease progression.  The CA-125 has not changed significantly over the past several months.  The plan is to continue Taxol/carboplatin.  She is tolerating the chemotherapy well.  She will complete another cycle today.  Anna [name] will return for an office visit and chemotherapy in 3 weeks.  [name], MD  01/16/2021  8:52 AM

## 2021-01-16 NOTE — Patient Instructions (Signed)
[facility] Discharge Instructions for Patients Receiving Chemotherapy  Today you received the following chemotherapy agents: Paclitaxel (TAXOL) & Carboplatin (PARAPLATIN).  To help prevent nausea and vomiting after your treatment, we encourage you to take your nausea medication as directed.   If you develop nausea and vomiting that is not controlled by your nausea medication, call the clinic.   BELOW ARE SYMPTOMS THAT SHOULD BE REPORTED IMMEDIATELY:  *FEVER GREATER THAN 100.5 F  *CHILLS WITH OR WITHOUT FEVER  NAUSEA AND VOMITING THAT IS NOT CONTROLLED WITH YOUR NAUSEA MEDICATION  *UNUSUAL SHORTNESS OF BREATH  *UNUSUAL BRUISING OR BLEEDING  TENDERNESS IN MOUTH AND THROAT WITH OR WITHOUT PRESENCE OF ULCERS  *URINARY PROBLEMS  *BOWEL PROBLEMS  UNUSUAL RASH Items with * indicate a potential emergency and should be followed up as soon as possible.  Feel free to call the clinic should you have any questions or concerns. The clinic phone number is [phone].  Please show the [facility] at check-in to the Emergency Department and triage nurse.

## 2021-01-17 ENCOUNTER — Other Ambulatory Visit: Payer: Self-pay

## 2021-01-17 ENCOUNTER — Other Ambulatory Visit: Payer: Self-pay | Admitting: Internal Medicine

## 2021-01-17 ENCOUNTER — Telehealth: Payer: Self-pay | Admitting: *Deleted

## 2021-01-17 ENCOUNTER — Telehealth: Payer: Self-pay | Admitting: Oncology

## 2021-01-17 ENCOUNTER — Encounter: Payer: Self-pay | Admitting: Oncology

## 2021-01-17 ENCOUNTER — Inpatient Hospital Stay: Payer: BC Managed Care – PPO

## 2021-01-17 VITALS — BP 143/82 | HR 104 | Temp 98.5°F | Resp 18

## 2021-01-17 DIAGNOSIS — Z7901 Long term (current) use of anticoagulants: Secondary | ICD-10-CM | POA: Diagnosis not present

## 2021-01-17 DIAGNOSIS — E039 Hypothyroidism, unspecified: Secondary | ICD-10-CM | POA: Diagnosis not present

## 2021-01-17 DIAGNOSIS — C5702 Malignant neoplasm of left fallopian tube: Secondary | ICD-10-CM | POA: Diagnosis not present

## 2021-01-17 DIAGNOSIS — Z5111 Encounter for antineoplastic chemotherapy: Secondary | ICD-10-CM | POA: Diagnosis not present

## 2021-01-17 DIAGNOSIS — Z86711 Personal history of pulmonary embolism: Secondary | ICD-10-CM | POA: Diagnosis not present

## 2021-01-17 DIAGNOSIS — R112 Nausea with vomiting, unspecified: Secondary | ICD-10-CM | POA: Diagnosis not present

## 2021-01-17 LAB — CA 125: Cancer Antigen (CA) 125: 48.6 U/mL — ABNORMAL HIGH (ref 0.0–38.1)

## 2021-01-17 MED ORDER — PEGFILGRASTIM-CBQV 6 MG/0.6ML ~~LOC~~ SOSY
PREFILLED_SYRINGE | SUBCUTANEOUS | Status: AC
  Filled 2021-01-17: qty 0.6

## 2021-01-17 MED ORDER — RIVAROXABAN 20 MG PO TABS: 20.0000 mg | ORAL_TABLET | Freq: Every day | ORAL | 11 refills | Status: DC

## 2021-01-17 MED ORDER — PEGFILGRASTIM-CBQV 6 MG/0.6ML ~~LOC~~ SOSY
6.0000 mg | PREFILLED_SYRINGE | Freq: Once | SUBCUTANEOUS | Status: AC
Start: 2021-01-17 — End: 2021-01-17
  Administered 2021-01-17: 6 mg via SUBCUTANEOUS

## 2021-01-17 NOTE — Telephone Encounter (Signed)
Per patient request, moved Xarelto script to [facility] as requested.

## 2021-01-17 NOTE — Patient Instructions (Signed)
Pegfilgrastim injection What is this medicine? PEGFILGRASTIM (PEG fil gra stim) is a long-acting granulocyte colony-stimulating factor that stimulates the growth of neutrophils, a type of white blood cell important in the body's fight against infection. It is used to reduce the incidence of fever and infection in patients with certain types of cancer who are receiving chemotherapy that affects the bone marrow, and to increase survival after being exposed to high doses of radiation. This medicine may be used for other purposes; ask your health care provider or pharmacist if you have questions. COMMON BRAND NAME(S): Fulphila, Neulasta, Nyvepria, UDENYCA, Ziextenzo What should I tell my health care provider before I take this medicine? They need to know if you have any of these conditions:  kidney disease  latex allergy  ongoing radiation therapy  sickle cell disease  skin reactions to acrylic adhesives (On-Body Injector only)  an unusual or allergic reaction to pegfilgrastim, filgrastim, other medicines, foods, dyes, or preservatives  pregnant or trying to get pregnant  breast-feeding How should I use this medicine? This medicine is for injection under the skin. If you get this medicine at home, you will be taught how to prepare and give the pre-filled syringe or how to use the On-body Injector. Refer to the patient Instructions for Use for detailed instructions. Use exactly as directed. Tell your healthcare provider immediately if you suspect that the On-body Injector may not have performed as intended or if you suspect the use of the On-body Injector resulted in a missed or partial dose. It is important that you put your used needles and syringes in a special sharps container. Do not put them in a trash can. If you do not have a sharps container, call your pharmacist or healthcare provider to get one. Talk to your pediatrician regarding the use of this medicine in children. While this drug  may be prescribed for selected conditions, precautions do apply. Overdosage: If you think you have taken too much of this medicine contact a poison control center or emergency room at once. NOTE: This medicine is only for you. Do not share this medicine with others. What if I miss a dose? It is important not to miss your dose. Call your doctor or health care professional if you miss your dose. If you miss a dose due to an On-body Injector failure or leakage, a new dose should be administered as soon as possible using a single prefilled syringe for manual use. What may interact with this medicine? Interactions have not been studied. This list may not describe all possible interactions. Give your health care provider a list of all the medicines, herbs, non-prescription drugs, or dietary supplements you use. Also tell them if you smoke, drink alcohol, or use illegal drugs. Some items may interact with your medicine. What should I watch for while using this medicine? Your condition will be monitored carefully while you are receiving this medicine. You may need blood work done while you are taking this medicine. Talk to your health care provider about your risk of cancer. You may be more at risk for certain types of cancer if you take this medicine. If you are going to need a MRI, CT scan, or other procedure, tell your doctor that you are using this medicine (On-Body Injector only). What side effects may I notice from receiving this medicine? Side effects that you should report to your doctor or health care professional as soon as possible:  allergic reactions (skin rash, itching or hives, swelling of   the face, lips, or tongue)  back pain  dizziness  fever  pain, redness, or irritation at site where injected  pinpoint red spots on the skin  red or dark-brown urine  shortness of breath or breathing problems  stomach or side pain, or pain at the shoulder  swelling  tiredness  trouble  passing urine or change in the amount of urine  unusual bruising or bleeding Side effects that usually do not require medical attention (report to your doctor or health care professional if they continue or are bothersome):  bone pain  muscle pain This list may not describe all possible side effects. Call your doctor for medical advice about side effects. You may report side effects to FDA at 1-800-FDA-1088. Where should I keep my medicine? Keep out of the reach of children. If you are using this medicine at home, you will be instructed on how to store it. Throw away any unused medicine after the expiration date on the label. NOTE: This sheet is a summary. It may not cover all possible information. If you have questions about this medicine, talk to your doctor, pharmacist, or health care provider.  2021 Elsevier/Gold Standard (2019-11-26 13:20:51)  

## 2021-01-17 NOTE — Telephone Encounter (Signed)
Scheduled appointments per 3/1 los. Spoke to patient who is aware of appointments dates and times.

## 2021-01-18 ENCOUNTER — Ambulatory Visit: Payer: BC Managed Care – PPO

## 2021-01-26 ENCOUNTER — Inpatient Hospital Stay (HOSPITAL_BASED_OUTPATIENT_CLINIC_OR_DEPARTMENT_OTHER): Payer: BC Managed Care – PPO | Admitting: Oncology

## 2021-01-26 ENCOUNTER — Telehealth: Payer: Self-pay | Admitting: *Deleted

## 2021-01-26 ENCOUNTER — Other Ambulatory Visit: Payer: Self-pay

## 2021-01-26 VITALS — BP 144/91 | HR 110 | Temp 98.8°F | Resp 18 | Ht 63.0 in | Wt 183.7 lb

## 2021-01-26 DIAGNOSIS — R112 Nausea with vomiting, unspecified: Secondary | ICD-10-CM | POA: Diagnosis not present

## 2021-01-26 DIAGNOSIS — C5702 Malignant neoplasm of left fallopian tube: Secondary | ICD-10-CM | POA: Diagnosis not present

## 2021-01-26 DIAGNOSIS — Z5111 Encounter for antineoplastic chemotherapy: Secondary | ICD-10-CM | POA: Diagnosis not present

## 2021-01-26 DIAGNOSIS — C5700 Malignant neoplasm of unspecified fallopian tube: Secondary | ICD-10-CM

## 2021-01-26 DIAGNOSIS — E039 Hypothyroidism, unspecified: Secondary | ICD-10-CM | POA: Diagnosis not present

## 2021-01-26 DIAGNOSIS — Z86711 Personal history of pulmonary embolism: Secondary | ICD-10-CM | POA: Diagnosis not present

## 2021-01-26 DIAGNOSIS — Z7901 Long term (current) use of anticoagulants: Secondary | ICD-10-CM | POA: Diagnosis not present

## 2021-01-26 MED ORDER — HYDROCODONE-ACETAMINOPHEN 5-325 MG PO TABS: 1.0000 | ORAL_TABLET | Freq: Four times a day (QID) | ORAL | 0 refills | Status: DC | PRN

## 2021-01-26 MED ORDER — METHYLPREDNISOLONE 4 MG PO TBPK: ORAL_TABLET | ORAL | 0 refills | Status: DC

## 2021-01-26 NOTE — Telephone Encounter (Signed)
Started having left lower back pain that radiates towards her hip and sacral area.  It has progressively gotten worse over 3 days.  Dull pain that becomes sharp with weight bearing (up to 8/10). Does not radiate down her leg. Sitting in chair with heating pad helps some. She has no pain meds in home.  Per Dr. Sherrill:Come in today at 2:15 pm. She agrees.

## 2021-01-26 NOTE — Progress Notes (Signed)
[facility] OFFICE PROGRESS NOTE   Diagnosis: Fallopian tube carcinoma  INTERVAL HISTORY:   [facility] [name] returns prior to scheduled visit.  She completed another cycle of Taxol/carboplatin on 01/16/2021.  She received G-CSF on 01/17/2021.  She reports the onset of pain at the left lower back on 01/24/2021.  The pain was worse yesterday and made it difficult to ambulate.  The pain is worse when she bears weight on the left heel.  No known trauma.  No neurologic symptoms.  No difficulty with bowel or bladder function.  No bleeding or symptom of thrombosis.  She took a hydrocodone last night and the pain improved.  No dysuria.  She reports an episode of nausea/vomiting on the morning following this cycle of chemotherapy.  The nausea resolved.  Objective:  Vital signs in last 24 hours:  Blood pressure (!) 144/91, pulse (!) 110, temperature 98.8 F (37.1 C), temperature source Tympanic, resp. rate 18, height 5' 3"  (1.6 m), weight 183 lb 11.2 oz (83.3 kg), SpO2 99 %.  Manual pulse-92     Resp: Lungs clear bilaterally Cardio: Regular rate and rhythm GI: No hepatosplenomegaly, no apparent ascites, no mass, nontender Vascular: No leg edema Musculoskeletal: The area of pain is at the lower lumbar/upper sacral area to the left of midline near the posterior iliac spine, mild tenderness in this region.  No mass.  No pain with motion at the left hip.  Bilateral leg strength appears intact.  Portacath/PICC-without erythema  Lab Results:  Lab Results  Component Value Date   WBC 5.1 01/16/2021   HGB 8.9 (L) 01/16/2021   HCT 27.8 (L) 01/16/2021   MCV 115.4 (H) 01/16/2021   PLT 153 01/16/2021   NEUTROABS 2.7 01/16/2021    CMP  Lab Results  Component Value Date   NA 141 01/16/2021   K 3.6 01/16/2021   CL 107 01/16/2021   CO2 22 01/16/2021   GLUCOSE 126 (H) 01/16/2021   BUN 16 01/16/2021   CREATININE 0.92 01/16/2021   CALCIUM 8.9 01/16/2021   PROT 7.2 01/16/2021   ALBUMIN  3.7 01/16/2021   AST 19 01/16/2021   ALT 22 01/16/2021   ALKPHOS 95 01/16/2021   BILITOT 0.3 01/16/2021   GFRNONAA >60 01/16/2021   GFRAA >60 08/10/2020    Lab Results  Component Value Date   CEA1 <1.00 06/27/2017    Lab Results  Component Value Date   INR 0.9 08/26/2019    Imaging:  No results found.  Medications: I have reviewed the patient's current medications.   Assessment/Plan: 1. eft abdomen/pelvic pain ? CT abdomen/pelvis 06/26/2017-soft tissue implants in the lower anterior peritoneum with implants at the paracolic gutters and left pelvic sidewall ? NegativeMYRIADhereditary cancer panel ? Elevated CA 125 ? CT biopsy of anterior omental mass 07/04/2017-Metastatic carcinoma consistent with a gynecologic primary ? 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 Cycle 1 adjuvant Taxol/carboplatin 08/05/2017 ? Cycle 2 adjuvant Taxol/carboplatin 08/26/2017 ? Cycle 3 adjuvant Taxol/carboplatin 09/17/2017-Taxol dose reduced secondary to neuropathy and bone pain ? Cycle 4 adjuvant Taxol/carboplatin 10/10/2017 ? Cycle 5 adjuvant Taxol/carboplatin 10/31/2017 ? Cycle 6 adjuvant Taxol/carboplatin 11/21/2017 ? CT abdomen/pelvis 08/08/2019-narrowing of rectosigmoid colon with eccentric serosal implant, 10 mm lymph  node at the superior rectal vein, left upper quadrant soft tissue implant, 9 mm precaval node, 15 mm left internal iliac node, no ascites ? Cycle 1 Taxol/carboplatin 08/31/2019 ? Cycle 2 Taxol/carboplatin/Avastin 09/21/2019 ? Cycle 3 Taxol/Carboplatin 10/12/2019 (Avastin discontinued) ? Cycle 4  Taxol/carboplatin November 02, 2019 ? CT abdomen/pelvis 11/17/2019-decrease in size of previously prominent pericaval, left internal iliac, and superior rectal vein nodes, resolved rectosigmoid wall thickening ? Cycle 5 Taxol/carboplatin 11/22/2019 ? Cycle 6 Taxol/carboplatin 12/13/2019 ? Olaparib2/17/2021 ? CT abdomen/pelvis 07/10/2020-enlarging soft tissue at the distal left ureter, the ureter is not dilated, peritoneal implant adjacent to the splenic flexure, enlarged left superior rectal node, lymph nodes along the course of the distal IMV have enlarged, eccentric thickening of the sigmoid colon not seen  ? Olaraparib discontinued 07/12/2020 ? Cycle 1 Taxol/carboplatin 07/21/2020 ? Cycle 2 Taxol/carboplatin 08/10/2020 (carboplatin dose reduced secondary to thrombocytopenia) ? Cycle 3 Taxol/carboplatin 09/05/2020 ? Cycle 4 Taxol/carboplatin 09/26/2020 ? Cycle 5 Taxol/carboplatin 10/24/2020 ? CT abdomen/pelvis 11/13/2020-overall stable disease, no new site of metastatic disease, generally stable small abdominal/pelvic lymph nodes, slight enlargement of a peritoneal implant in the left pelvis, slight decrease in size of another pelvic implant ? Cycle 6 Taxol/carboplatin 11/14/2020 ? Cycle 7 Taxol/carboplatin 12/05/2020 ? Cycle 8 Taxol/carboplatin 12/26/2020 ? Cycle 9 Taxol/carboplatin 01/16/2021  2. Depression  3. Hypothyroid  4. Family history of uterine cancer-paternal grandmother  7.Acute onset dyspnea/pleuritic left-sided chest pain 10/02/2019  CT chest 10/04/2019-left lower lobe pulmonary emboli, multifocal pneumonia lower lobes  On Xarelto, status post course of Levaquin.  Disposition: Anna [name] is now at day 75 following a cycle of Taxol/carboplatin.  She presents with acute onset lower back pain.  I suspect the pain is related to a benign musculoskeletal condition, potentially lumbar disc disease.  I doubt the pain is related to the Fallopian tube carcinoma.  It is possible  the pain is related to Taxol or G-CSF, but she has not experienced musculoskeletal pain with previous cycles.  Anna [name] will be placed on a steroid Dosepak.  She will use hydrocodone as needed for severe pain.  She will contact us 01/29/2021 if the pain is not improved and we will consider imaging studies.  She will return for an office visit as scheduled on 02/06/2021.  [name], MD  01/26/2021  3:16 PM

## 2021-02-04 ENCOUNTER — Other Ambulatory Visit: Payer: Self-pay | Admitting: Oncology

## 2021-02-06 ENCOUNTER — Inpatient Hospital Stay: Payer: BC Managed Care – PPO

## 2021-02-06 ENCOUNTER — Inpatient Hospital Stay (HOSPITAL_BASED_OUTPATIENT_CLINIC_OR_DEPARTMENT_OTHER): Payer: BC Managed Care – PPO | Admitting: Oncology

## 2021-02-06 ENCOUNTER — Telehealth: Payer: Self-pay | Admitting: Oncology

## 2021-02-06 ENCOUNTER — Other Ambulatory Visit: Payer: Self-pay

## 2021-02-06 VITALS — BP 144/80 | HR 95 | Temp 97.6°F | Resp 14 | Ht 63.0 in | Wt 183.9 lb

## 2021-02-06 DIAGNOSIS — Z86711 Personal history of pulmonary embolism: Secondary | ICD-10-CM | POA: Diagnosis not present

## 2021-02-06 DIAGNOSIS — C5700 Malignant neoplasm of unspecified fallopian tube: Secondary | ICD-10-CM

## 2021-02-06 DIAGNOSIS — Z95828 Presence of other vascular implants and grafts: Secondary | ICD-10-CM

## 2021-02-06 DIAGNOSIS — E039 Hypothyroidism, unspecified: Secondary | ICD-10-CM | POA: Diagnosis not present

## 2021-02-06 DIAGNOSIS — R112 Nausea with vomiting, unspecified: Secondary | ICD-10-CM | POA: Diagnosis not present

## 2021-02-06 DIAGNOSIS — C5702 Malignant neoplasm of left fallopian tube: Secondary | ICD-10-CM | POA: Diagnosis not present

## 2021-02-06 DIAGNOSIS — Z5111 Encounter for antineoplastic chemotherapy: Secondary | ICD-10-CM | POA: Diagnosis not present

## 2021-02-06 DIAGNOSIS — Z7901 Long term (current) use of anticoagulants: Secondary | ICD-10-CM | POA: Diagnosis not present

## 2021-02-06 LAB — CBC WITH DIFFERENTIAL (CANCER CENTER ONLY)
Abs Immature Granulocytes: 0.04 10*3/uL (ref 0.00–0.07)
Basophils Absolute: 0 10*3/uL (ref 0.0–0.1)
Basophils Relative: 0 %
Eosinophils Absolute: 0 10*3/uL (ref 0.0–0.5)
Eosinophils Relative: 1 %
HCT: 28 % — ABNORMAL LOW (ref 36.0–46.0)
Hemoglobin: 9 g/dL — ABNORMAL LOW (ref 12.0–15.0)
Immature Granulocytes: 1 %
Lymphocytes Relative: 43 %
Lymphs Abs: 2.4 10*3/uL (ref 0.7–4.0)
MCH: 36.7 pg — ABNORMAL HIGH (ref 26.0–34.0)
MCHC: 32.1 g/dL (ref 30.0–36.0)
MCV: 114.3 fL — ABNORMAL HIGH (ref 80.0–100.0)
Monocytes Absolute: 0.7 10*3/uL (ref 0.1–1.0)
Monocytes Relative: 13 %
Neutro Abs: 2.4 10*3/uL (ref 1.7–7.7)
Neutrophils Relative %: 42 %
Platelet Count: 174 10*3/uL (ref 150–400)
RBC: 2.45 MIL/uL — ABNORMAL LOW (ref 3.87–5.11)
RDW: 14.6 % (ref 11.5–15.5)
WBC Count: 5.6 10*3/uL (ref 4.0–10.5)
nRBC: 0 % (ref 0.0–0.2)

## 2021-02-06 LAB — CMP (CANCER CENTER ONLY)
ALT: 24 U/L (ref 0–44)
AST: 19 U/L (ref 15–41)
Albumin: 3.5 g/dL (ref 3.5–5.0)
Alkaline Phosphatase: 93 U/L (ref 38–126)
Anion gap: 12 (ref 5–15)
BUN: 13 mg/dL (ref 8–23)
CO2: 24 mmol/L (ref 22–32)
Calcium: 9 mg/dL (ref 8.9–10.3)
Chloride: 104 mmol/L (ref 98–111)
Creatinine: 0.82 mg/dL (ref 0.44–1.00)
GFR, Estimated: >60 mL/min (ref >60)
Glucose, Bld: 97 mg/dL (ref 70–99)
Potassium: 4.1 mmol/L (ref 3.5–5.1)
Sodium: 140 mmol/L (ref 135–145)
Total Bilirubin: 0.3 mg/dL (ref 0.3–1.2)
Total Protein: 7.2 g/dL (ref 6.5–8.1)

## 2021-02-06 MED ORDER — SODIUM CHLORIDE 0.9% FLUSH
10.0000 mL | INTRAVENOUS | Status: DC | PRN
  Administered 2021-02-06: 10 mL
  Filled 2021-02-06: qty 10

## 2021-02-06 MED ORDER — FAMOTIDINE IN NACL 20-0.9 MG/50ML-% IV SOLN
20.0000 mg | Freq: Once | INTRAVENOUS | Status: AC
  Administered 2021-02-06: 20 mg via INTRAVENOUS

## 2021-02-06 MED ORDER — SODIUM CHLORIDE 0.9 % IV SOLN
140.0000 mg/m2 | Freq: Once | INTRAVENOUS | Status: AC
  Administered 2021-02-06: 258 mg via INTRAVENOUS
  Filled 2021-02-06: qty 43

## 2021-02-06 MED ORDER — FAMOTIDINE IN NACL 20-0.9 MG/50ML-% IV SOLN
INTRAVENOUS | Status: AC
  Filled 2021-02-06: qty 50

## 2021-02-06 MED ORDER — HEPARIN SOD (PORK) LOCK FLUSH 100 UNIT/ML IV SOLN
500.0000 [IU] | Freq: Once | INTRAVENOUS | Status: AC | PRN
  Administered 2021-02-06: 500 [IU]
  Filled 2021-02-06: qty 5

## 2021-02-06 MED ORDER — PALONOSETRON HCL INJECTION 0.25 MG/5ML
0.2500 mg | Freq: Once | INTRAVENOUS | Status: AC
  Administered 2021-02-06: 0.25 mg via INTRAVENOUS

## 2021-02-06 MED ORDER — SODIUM CHLORIDE 0.9 % IV SOLN
Freq: Once | INTRAVENOUS | Status: AC
  Filled 2021-02-06: qty 250

## 2021-02-06 MED ORDER — PALONOSETRON HCL INJECTION 0.25 MG/5ML
INTRAVENOUS | Status: AC
  Filled 2021-02-06: qty 5

## 2021-02-06 MED ORDER — SODIUM CHLORIDE 0.9 % IV SOLN
340.0000 mg | Freq: Once | INTRAVENOUS | Status: AC
  Administered 2021-02-06: 340 mg via INTRAVENOUS
  Filled 2021-02-06: qty 34

## 2021-02-06 MED ORDER — DIPHENHYDRAMINE HCL 50 MG/ML IJ SOLN
INTRAMUSCULAR | Status: AC
  Filled 2021-02-06: qty 1

## 2021-02-06 MED ORDER — SODIUM CHLORIDE 0.9 % IV SOLN
150.0000 mg | Freq: Once | INTRAVENOUS | Status: AC
  Administered 2021-02-06: 150 mg via INTRAVENOUS
  Filled 2021-02-06: qty 150

## 2021-02-06 MED ORDER — SODIUM CHLORIDE 0.9 % IV SOLN
10.0000 mg | Freq: Once | INTRAVENOUS | Status: AC
  Administered 2021-02-06: 10 mg via INTRAVENOUS
  Filled 2021-02-06: qty 10

## 2021-02-06 MED ORDER — DIPHENHYDRAMINE HCL 50 MG/ML IJ SOLN
50.0000 mg | Freq: Once | INTRAMUSCULAR | Status: AC
  Administered 2021-02-06: 50 mg via INTRAVENOUS

## 2021-02-06 NOTE — Progress Notes (Signed)
[facility] OFFICE PROGRESS NOTE   Diagnosis: Fallopian tube carcinoma  INTERVAL HISTORY:   Anna [name] returns as scheduled.  She was here on 01/26/2021 with acute onset low back pain.  We prescribed a Medrol Dosepak and hydrocodone.  She reports the pain resolved at the completion of the Elim.  She took hydrocodone in the evening for approximately 4 days.  She feels well at present.  No abdominal pain.  Good appetite.  No neuropathy symptoms.  Objective:  Vital signs in last 24 hours:  Blood pressure (!) 144/80, pulse 95, temperature 97.6 F (36.4 C), temperature source Tympanic, resp. rate 14, height _0  (1.6 m), weight 183 lb 14.4 oz (83.4 kg), SpO2 100 %.    HEENT: White coat over the tongue, no buccal thrush Resp: Lungs clear bilaterally Cardio: Regular rate and rhythm GI: Nontender, no apparent ascites, no hepatosplenomegaly, no mass Vascular: No leg edema   Portacath/PICC-without erythema  Lab Results:  Lab Results  Component Value Date   WBC 5.6 02/06/2021   HGB 9.0 (L) 02/06/2021   HCT 28.0 (L) 02/06/2021   MCV 114.3 (H) 02/06/2021   PLT 174 02/06/2021   NEUTROABS 2.4 02/06/2021    CMP  Lab Results  Component Value Date   NA 141 01/16/2021   K 3.6 01/16/2021   CL 107 01/16/2021   CO2 22 01/16/2021   GLUCOSE 126 (H) 01/16/2021   BUN 16 01/16/2021   CREATININE 0.92 01/16/2021   CALCIUM 8.9 01/16/2021   PROT 7.2 01/16/2021   ALBUMIN 3.7 01/16/2021   AST 19 01/16/2021   ALT 22 01/16/2021   ALKPHOS 95 01/16/2021   BILITOT 0.3 01/16/2021   GFRNONAA >60 01/16/2021   GFRAA >60 08/10/2020    Lab Results  Component Value Date   CEA1 <1.00 06/27/2017     Medications: I have reviewed the patient's current medications.   Assessment/Plan: 1. eft abdomen/pelvic pain ? CT abdomen/pelvis 06/26/2017-soft tissue implants in the lower anterior peritoneum with implants at the paracolic gutters and left pelvic  sidewall ? NegativeMYRIADhereditary cancer panel ? Elevated CA 125 ? CT biopsy of anterior omental mass 07/04/2017-Metastatic carcinoma consistent with a gynecologic primary ? 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 Cycle 1 adjuvant Taxol/carboplatin 08/05/2017 ? Cycle 2 adjuvant Taxol/carboplatin 08/26/2017 ? Cycle 3 adjuvant Taxol/carboplatin 09/17/2017-Taxol dose reduced secondary to neuropathy and bone pain ? Cycle 4 adjuvant Taxol/carboplatin 10/10/2017 ? Cycle 5 adjuvant Taxol/carboplatin 10/31/2017 ? Cycle 6 adjuvant Taxol/carboplatin 11/21/2017 ? CT abdomen/pelvis 08/08/2019-narrowing of rectosigmoid colon with eccentric serosal implant, 10 mm lymph node at the superior rectal vein, left upper quadrant soft tissue implant, 9 mm precaval node, 15 mm left internal iliac node, no ascites ? Cycle 1 Taxol/carboplatin 08/31/2019 ? Cycle 2 Taxol/carboplatin/Avastin 09/21/2019 ? Cycle 3 Taxol/Carboplatin 10/12/2019 (Avastin discontinued) ? Cycle 4 Taxol/carboplatin November 02, 2019 ? CT abdomen/pelvis 11/17/2019-decrease in size of previously prominent pericaval, left internal iliac, and superior rectal vein nodes, resolved rectosigmoid wall thickening ? Cycle 5 Taxol/carboplatin 11/22/2019 ? Cycle 6 Taxol/carboplatin 12/13/2019 ? Olaparib2/17/2021 ? CT abdomen/pelvis 07/10/2020-enlarging soft tissue at the distal left ureter, the ureter is not dilated, peritoneal implant adjacent to the splenic flexure, enlarged left superior rectal node, lymph  nodes along the course of the distal IMV have enlarged, eccentric thickening of  the sigmoid colon not seen  ? Olaraparib discontinued 07/12/2020 ? Cycle 1 Taxol/carboplatin 07/21/2020 ? Cycle 2 Taxol/carboplatin 08/10/2020 (carboplatin dose reduced secondary to thrombocytopenia) ? Cycle 3 Taxol/carboplatin 09/05/2020 ? Cycle 4 Taxol/carboplatin 09/26/2020 ? Cycle 5 Taxol/carboplatin 10/24/2020 ? CT abdomen/pelvis 11/13/2020-overall stable disease, no new site of metastatic disease, generally stable small abdominal/pelvic lymph nodes, slight enlargement of a peritoneal implant in the left pelvis, slight decrease in size of another pelvic implant ? Cycle 6 Taxol/carboplatin 11/14/2020 ? Cycle 7 Taxol/carboplatin 12/05/2020 ? Cycle 8 Taxol/carboplatin 12/26/2020 ? Cycle 9 Taxol/carboplatin 01/16/2021 ? Cycle 10 Taxol/carboplatin 02/06/2021  2. Depression  3. Hypothyroid  4. Family history of uterine cancer-paternal grandmother  43.Acute onset dyspnea/pleuritic left-sided chest pain 10/02/2019  CT chest 10/04/2019-left lower lobe pulmonary emboli, multifocal pneumonia lower lobes  On Xarelto, status post course of Levaquin.  6.  Acute onset left lower back pain 01/24/2021-resolved after a Medrol Dosepak    Disposition: [name] appears stable.  The back pain has resolved.  It appears the pain was related to a benign musculoskeletal condition.  She will complete another treatment with Taxol/carboplatin today.  The CA-125 was stable on 01/16/2021.  We will follow up on the CA-125 from today.  She will return for an office visit and chemotherapy in 3 weeks.  [name], MD  02/06/2021  10:20 AM

## 2021-02-06 NOTE — Telephone Encounter (Signed)
Scheduled appt per 3/22 los - pt to get an updated schedule next visit.   

## 2021-02-06 NOTE — Patient Instructions (Signed)
Implanted Port Insertion, Care After This sheet gives you information about how to care for yourself after your procedure. Your health care provider may also give you more specific instructions. If you have problems or questions, contact your health care provider. What can I expect after the procedure? After the procedure, it is common to have:  Discomfort at the port insertion site.  Bruising on the skin over the port. This should improve over 3-4 days. Follow these instructions at home: Port care  After your port is placed, you will get a manufacturer's information card. The card has information about your port. Keep this card with you at all times.  Take care of the port as told by your health care provider. Ask your health care provider if you or a family member can get training for taking care of the port at home. A home health care nurse may also take care of the port.  Make sure to remember what type of port you have. Incision care  Follow instructions from your health care provider about how to take care of your port insertion site. Make sure you: ? Wash your hands with soap and water before and after you change your bandage (dressing). If soap and water are not available, use hand sanitizer. ? Change your dressing as told by your health care provider. ? Leave stitches (sutures), skin glue, or adhesive strips in place. These skin closures may need to stay in place for 2 weeks or longer. If adhesive strip edges start to loosen and curl up, you may trim the loose edges. Do not remove adhesive strips completely unless your health care provider tells you to do that.  Check your port insertion site every day for signs of infection. Check for: ? Redness, swelling, or pain. ? Fluid or blood. ? Warmth. ? Pus or a bad smell.      Activity  Return to your normal activities as told by your health care provider. Ask your health care provider what activities are safe for you.  Do not  lift anything that is heavier than 10 lb (4.5 kg), or the limit that you are told, until your health care provider says that it is safe. General instructions  Take over-the-counter and prescription medicines only as told by your health care provider.  Do not take baths, swim, or use a hot tub until your health care provider approves. Ask your health care provider if you may take showers. You may only be allowed to take sponge baths.  Do not drive for 24 hours if you were given a sedative during your procedure.  Wear a medical alert bracelet in case of an emergency. This will tell any health care providers that you have a port.  Keep all follow-up visits as told by your health care provider. This is important. Contact a health care provider if:  You cannot flush your port with saline as directed, or you cannot draw blood from the port.  You have a fever or chills.  You have redness, swelling, or pain around your port insertion site.  You have fluid or blood coming from your port insertion site.  Your port insertion site feels warm to the touch.  You have pus or a bad smell coming from the port insertion site. Get help right away if:  You have chest pain or shortness of breath.  You have bleeding from your port that you cannot control. Summary  Take care of the port as told by your   health care provider. Keep the manufacturer's information card with you at all times.  Change your dressing as told by your health care provider.  Contact a health care provider if you have a fever or chills or if you have redness, swelling, or pain around your port insertion site.  Keep all follow-up visits as told by your health care provider. This information is not intended to replace advice given to you by your health care provider. Make sure you discuss any questions you have with your health care provider. Document Revised: 06/02/2018 Document Reviewed: 06/02/2018 Elsevier Patient Education   2021 Elsevier Inc.  

## 2021-02-06 NOTE — Patient Instructions (Signed)
[facility] Discharge Instructions for Patients Receiving Chemotherapy  Today you received the following chemotherapy agents Taxol, Carboplatin  To help prevent nausea and vomiting after your treatment, we encourage you to take your nausea medication as directed  If you develop nausea and vomiting that is not controlled by your nausea medication, call the clinic.   BELOW ARE SYMPTOMS THAT SHOULD BE REPORTED IMMEDIATELY:  *FEVER GREATER THAN 100.5 F  *CHILLS WITH OR WITHOUT FEVER  NAUSEA AND VOMITING THAT IS NOT CONTROLLED WITH YOUR NAUSEA MEDICATION  *UNUSUAL SHORTNESS OF BREATH  *UNUSUAL BRUISING OR BLEEDING  TENDERNESS IN MOUTH AND THROAT WITH OR WITHOUT PRESENCE OF ULCERS  *URINARY PROBLEMS  *BOWEL PROBLEMS  UNUSUAL RASH Items with * indicate a potential emergency and should be followed up as soon as possible.  Feel free to call the clinic should you have any questions or concerns. The clinic phone number is [phone].  Please show the CHEMO ALERT CARD at check-in to the Emergency Department and triage nurse.   

## 2021-02-07 LAB — CA 125: Cancer Antigen (CA) 125: 45.2 U/mL — ABNORMAL HIGH (ref 0.0–38.1)

## 2021-02-08 ENCOUNTER — Other Ambulatory Visit: Payer: Self-pay

## 2021-02-08 ENCOUNTER — Inpatient Hospital Stay: Payer: BC Managed Care – PPO

## 2021-02-08 VITALS — BP 98/56 | HR 100 | Temp 98.8°F | Resp 16

## 2021-02-08 DIAGNOSIS — Z5111 Encounter for antineoplastic chemotherapy: Secondary | ICD-10-CM | POA: Diagnosis not present

## 2021-02-08 DIAGNOSIS — Z7901 Long term (current) use of anticoagulants: Secondary | ICD-10-CM | POA: Diagnosis not present

## 2021-02-08 DIAGNOSIS — C5702 Malignant neoplasm of left fallopian tube: Secondary | ICD-10-CM | POA: Diagnosis not present

## 2021-02-08 DIAGNOSIS — Z86711 Personal history of pulmonary embolism: Secondary | ICD-10-CM | POA: Diagnosis not present

## 2021-02-08 DIAGNOSIS — R112 Nausea with vomiting, unspecified: Secondary | ICD-10-CM | POA: Diagnosis not present

## 2021-02-08 DIAGNOSIS — E039 Hypothyroidism, unspecified: Secondary | ICD-10-CM | POA: Diagnosis not present

## 2021-02-08 MED ORDER — PEGFILGRASTIM-CBQV 6 MG/0.6ML ~~LOC~~ SOSY
6.0000 mg | PREFILLED_SYRINGE | Freq: Once | SUBCUTANEOUS | Status: AC
  Administered 2021-02-08: 6 mg via SUBCUTANEOUS

## 2021-02-08 MED ORDER — PEGFILGRASTIM-CBQV 6 MG/0.6ML ~~LOC~~ SOSY
PREFILLED_SYRINGE | SUBCUTANEOUS | Status: AC
  Filled 2021-02-08: qty 0.6

## 2021-02-08 NOTE — Patient Instructions (Signed)
Pegfilgrastim injection What is this medicine? PEGFILGRASTIM (PEG fil gra stim) is a long-acting granulocyte colony-stimulating factor that stimulates the growth of neutrophils, a type of white blood cell important in the body's fight against infection. It is used to reduce the incidence of fever and infection in patients with certain types of cancer who are receiving chemotherapy that affects the bone marrow, and to increase survival after being exposed to high doses of radiation. This medicine may be used for other purposes; ask your health care provider or pharmacist if you have questions. COMMON BRAND NAME(S): Fulphila, Neulasta, Nyvepria, UDENYCA, Ziextenzo What should I tell my health care provider before I take this medicine? They need to know if you have any of these conditions:  kidney disease  latex allergy  ongoing radiation therapy  sickle cell disease  skin reactions to acrylic adhesives (On-Body Injector only)  an unusual or allergic reaction to pegfilgrastim, filgrastim, other medicines, foods, dyes, or preservatives  pregnant or trying to get pregnant  breast-feeding How should I use this medicine? This medicine is for injection under the skin. If you get this medicine at home, you will be taught how to prepare and give the pre-filled syringe or how to use the On-body Injector. Refer to the patient Instructions for Use for detailed instructions. Use exactly as directed. Tell your healthcare provider immediately if you suspect that the On-body Injector may not have performed as intended or if you suspect the use of the On-body Injector resulted in a missed or partial dose. It is important that you put your used needles and syringes in a special sharps container. Do not put them in a trash can. If you do not have a sharps container, call your pharmacist or healthcare provider to get one. Talk to your pediatrician regarding the use of this medicine in children. While this drug  may be prescribed for selected conditions, precautions do apply. Overdosage: If you think you have taken too much of this medicine contact a poison control center or emergency room at once. NOTE: This medicine is only for you. Do not share this medicine with others. What if I miss a dose? It is important not to miss your dose. Call your doctor or health care professional if you miss your dose. If you miss a dose due to an On-body Injector failure or leakage, a new dose should be administered as soon as possible using a single prefilled syringe for manual use. What may interact with this medicine? Interactions have not been studied. This list may not describe all possible interactions. Give your health care provider a list of all the medicines, herbs, non-prescription drugs, or dietary supplements you use. Also tell them if you smoke, drink alcohol, or use illegal drugs. Some items may interact with your medicine. What should I watch for while using this medicine? Your condition will be monitored carefully while you are receiving this medicine. You may need blood work done while you are taking this medicine. Talk to your health care provider about your risk of cancer. You may be more at risk for certain types of cancer if you take this medicine. If you are going to need a MRI, CT scan, or other procedure, tell your doctor that you are using this medicine (On-Body Injector only). What side effects may I notice from receiving this medicine? Side effects that you should report to your doctor or health care professional as soon as possible:  allergic reactions (skin rash, itching or hives, swelling of   the face, lips, or tongue)  back pain  dizziness  fever  pain, redness, or irritation at site where injected  pinpoint red spots on the skin  red or dark-brown urine  shortness of breath or breathing problems  stomach or side pain, or pain at the shoulder  swelling  tiredness  trouble  passing urine or change in the amount of urine  unusual bruising or bleeding Side effects that usually do not require medical attention (report to your doctor or health care professional if they continue or are bothersome):  bone pain  muscle pain This list may not describe all possible side effects. Call your doctor for medical advice about side effects. You may report side effects to FDA at 1-800-FDA-1088. Where should I keep my medicine? Keep out of the reach of children. If you are using this medicine at home, you will be instructed on how to store it. Throw away any unused medicine after the expiration date on the label. NOTE: This sheet is a summary. It may not cover all possible information. If you have questions about this medicine, talk to your doctor, pharmacist, or health care provider.  2021 Elsevier/Gold Standard (2019-11-26 13:20:51)  

## 2021-02-25 ENCOUNTER — Other Ambulatory Visit: Payer: Self-pay | Admitting: Oncology

## 2021-02-27 ENCOUNTER — Inpatient Hospital Stay: Payer: BC Managed Care – PPO

## 2021-02-27 ENCOUNTER — Other Ambulatory Visit: Payer: BC Managed Care – PPO

## 2021-02-27 ENCOUNTER — Ambulatory Visit: Payer: BC Managed Care – PPO

## 2021-02-27 ENCOUNTER — Other Ambulatory Visit: Payer: Self-pay

## 2021-02-27 ENCOUNTER — Inpatient Hospital Stay: Payer: BC Managed Care – PPO | Attending: Oncology | Admitting: Oncology

## 2021-02-27 VITALS — BP 147/89 | HR 97 | Temp 97.8°F | Resp 20 | Ht 63.0 in | Wt 182.4 lb

## 2021-02-27 DIAGNOSIS — Z5111 Encounter for antineoplastic chemotherapy: Secondary | ICD-10-CM | POA: Diagnosis not present

## 2021-02-27 DIAGNOSIS — E039 Hypothyroidism, unspecified: Secondary | ICD-10-CM | POA: Insufficient documentation

## 2021-02-27 DIAGNOSIS — C5702 Malignant neoplasm of left fallopian tube: Secondary | ICD-10-CM

## 2021-02-27 DIAGNOSIS — Z8781 Personal history of (healed) traumatic fracture: Secondary | ICD-10-CM | POA: Insufficient documentation

## 2021-02-27 DIAGNOSIS — Z8049 Family history of malignant neoplasm of other genital organs: Secondary | ICD-10-CM | POA: Diagnosis not present

## 2021-02-27 DIAGNOSIS — F32A Depression, unspecified: Secondary | ICD-10-CM | POA: Diagnosis not present

## 2021-02-27 DIAGNOSIS — C786 Secondary malignant neoplasm of retroperitoneum and peritoneum: Secondary | ICD-10-CM | POA: Insufficient documentation

## 2021-02-27 DIAGNOSIS — Z5189 Encounter for other specified aftercare: Secondary | ICD-10-CM | POA: Diagnosis not present

## 2021-02-27 DIAGNOSIS — C5700 Malignant neoplasm of unspecified fallopian tube: Secondary | ICD-10-CM

## 2021-02-27 LAB — CBC WITH DIFFERENTIAL (CANCER CENTER ONLY)
Abs Immature Granulocytes: 0.01 10*3/uL (ref 0.00–0.07)
Basophils Absolute: 0 10*3/uL (ref 0.0–0.1)
Basophils Relative: 1 %
Eosinophils Absolute: 0 10*3/uL (ref 0.0–0.5)
Eosinophils Relative: 1 %
HCT: 26.7 % — ABNORMAL LOW (ref 36.0–46.0)
Hemoglobin: 8.5 g/dL — ABNORMAL LOW (ref 12.0–15.0)
Immature Granulocytes: 0 %
Lymphocytes Relative: 48 %
Lymphs Abs: 1.6 10*3/uL (ref 0.7–4.0)
MCH: 36.3 pg — ABNORMAL HIGH (ref 26.0–34.0)
MCHC: 31.8 g/dL (ref 30.0–36.0)
MCV: 114.1 fL — ABNORMAL HIGH (ref 80.0–100.0)
Monocytes Absolute: 0.5 10*3/uL (ref 0.1–1.0)
Monocytes Relative: 15 %
Neutro Abs: 1.2 10*3/uL — ABNORMAL LOW (ref 1.7–7.7)
Neutrophils Relative %: 35 %
Platelet Count: 127 10*3/uL — ABNORMAL LOW (ref 150–400)
RBC: 2.34 MIL/uL — ABNORMAL LOW (ref 3.87–5.11)
RDW: 15.5 % (ref 11.5–15.5)
WBC Count: 3.3 10*3/uL — ABNORMAL LOW (ref 4.0–10.5)
nRBC: 0 % (ref 0.0–0.2)

## 2021-02-27 LAB — CMP (CANCER CENTER ONLY)
ALT: 17 U/L (ref 0–44)
AST: 18 U/L (ref 15–41)
Albumin: 3.9 g/dL (ref 3.5–5.0)
Alkaline Phosphatase: 67 U/L (ref 38–126)
Anion gap: 8 (ref 5–15)
BUN: 11 mg/dL (ref 8–23)
CO2: 25 mmol/L (ref 22–32)
Calcium: 8.8 mg/dL — ABNORMAL LOW (ref 8.9–10.3)
Chloride: 107 mmol/L (ref 98–111)
Creatinine: 0.73 mg/dL (ref 0.44–1.00)
GFR, Estimated: >60 mL/min (ref >60)
Glucose, Bld: 98 mg/dL (ref 70–99)
Potassium: 3.8 mmol/L (ref 3.5–5.1)
Sodium: 140 mmol/L (ref 135–145)
Total Bilirubin: 0.3 mg/dL (ref 0.3–1.2)
Total Protein: 6.9 g/dL (ref 6.5–8.1)

## 2021-02-27 MED ORDER — PALONOSETRON HCL INJECTION 0.25 MG/5ML
0.2500 mg | Freq: Once | INTRAVENOUS | Status: AC
  Administered 2021-02-27: 0.25 mg via INTRAVENOUS
  Filled 2021-02-27: qty 5

## 2021-02-27 MED ORDER — CARBOPLATIN CHEMO INJECTION 450 MG/45ML
340.0000 mg | Freq: Once | INTRAVENOUS | Status: AC
  Administered 2021-02-27: 340 mg via INTRAVENOUS
  Filled 2021-02-27: qty 34

## 2021-02-27 MED ORDER — HEPARIN SOD (PORK) LOCK FLUSH 100 UNIT/ML IV SOLN
500.0000 [IU] | Freq: Once | INTRAVENOUS | Status: AC | PRN
  Administered 2021-02-27: 500 [IU]
  Filled 2021-02-27: qty 5

## 2021-02-27 MED ORDER — SODIUM CHLORIDE 0.9 % IV SOLN
Freq: Once | INTRAVENOUS | Status: AC
  Filled 2021-02-27: qty 250

## 2021-02-27 MED ORDER — FAMOTIDINE IN NACL 20-0.9 MG/50ML-% IV SOLN
20.0000 mg | Freq: Once | INTRAVENOUS | Status: AC
  Administered 2021-02-27: 20 mg via INTRAVENOUS
  Filled 2021-02-27: qty 50

## 2021-02-27 MED ORDER — SODIUM CHLORIDE 0.9 % IV SOLN
10.0000 mg | Freq: Once | INTRAVENOUS | Status: AC
  Administered 2021-02-27: 10 mg via INTRAVENOUS
  Filled 2021-02-27: qty 1

## 2021-02-27 MED ORDER — SODIUM CHLORIDE 0.9 % IV SOLN
150.0000 mg | Freq: Once | INTRAVENOUS | Status: AC
  Administered 2021-02-27: 150 mg via INTRAVENOUS
  Filled 2021-02-27: qty 5

## 2021-02-27 MED ORDER — SODIUM CHLORIDE 0.9 % IV SOLN
140.0000 mg/m2 | Freq: Once | INTRAVENOUS | Status: AC
  Administered 2021-02-27: 258 mg via INTRAVENOUS
  Filled 2021-02-27: qty 43

## 2021-02-27 MED ORDER — DIPHENHYDRAMINE HCL 50 MG/ML IJ SOLN
50.0000 mg | Freq: Once | INTRAMUSCULAR | Status: AC
  Administered 2021-02-27: 50 mg via INTRAVENOUS
  Filled 2021-02-27: qty 1

## 2021-02-27 MED ORDER — SODIUM CHLORIDE 0.9% FLUSH
10.0000 mL | INTRAVENOUS | Status: DC | PRN
  Administered 2021-02-27: 10 mL
  Filled 2021-02-27: qty 10

## 2021-02-27 NOTE — Progress Notes (Signed)
Per Dr. Benay Spice: okay to treat with [place] of 1.2

## 2021-02-27 NOTE — Patient Instructions (Signed)
[facility] Discharge Instructions for Patients Receiving Chemotherapy  Today you received the following chemotherapy agents Paclitaxel (TAXOL) & Carboplatin (PARAPLATIN).  To help prevent nausea and vomiting after your treatment, we encourage you to take your nausea medication as prescribed.   If you develop nausea and vomiting that is not controlled by your nausea medication, call the clinic.   BELOW ARE SYMPTOMS THAT SHOULD BE REPORTED IMMEDIATELY:  *FEVER GREATER THAN 100.5 F  *CHILLS WITH OR WITHOUT FEVER  NAUSEA AND VOMITING THAT IS NOT CONTROLLED WITH YOUR NAUSEA MEDICATION  *UNUSUAL SHORTNESS OF BREATH  *UNUSUAL BRUISING OR BLEEDING  TENDERNESS IN MOUTH AND THROAT WITH OR WITHOUT PRESENCE OF ULCERS  *URINARY PROBLEMS  *BOWEL PROBLEMS  UNUSUAL RASH Items with * indicate a potential emergency and should be followed up as soon as possible.  Feel free to call the clinic should you have any questions or concerns at The clinic phone number is [phone].  Please show the [facility] at check-in to the Emergency Department and triage nurse.

## 2021-02-27 NOTE — Progress Notes (Signed)
[facility] OFFICE PROGRESS NOTE   Diagnosis: Fallopian tube carcinoma  INTERVAL HISTORY:   Anna [name] completed another cycle of Taxol/carboplatin on 02/06/2021.  She had an episode of nausea/vomiting during the week following chemotherapy.  She had peripheral numbness for a few days following chemotherapy.  This has resolved.  No abdominal pain.  No difficulty with bowel function.  No bleeding.  No back pain.  Objective:  Vital signs in last 24 hours:  Blood pressure (!) 147/89, pulse 97, temperature 97.8 F (36.6 C), temperature source Tympanic, resp. rate 20, height 5' 3"  (1.6 m), weight 182 lb 6.4 oz (82.7 kg), SpO2 100 %.    HEENT: No thrush or ulcers Resp: Lungs clear bilaterally Cardio: Regular rate and rhythm GI: No mass, no apparent ascites, nontender, no hepatosplenomegaly Vascular: No leg edema   Portacath/PICC-without erythema  Lab Results:  Lab Results  Component Value Date   WBC 3.3 (L) 02/27/2021   HGB 8.5 (L) 02/27/2021   HCT 26.7 (L) 02/27/2021   MCV 114.1 (H) 02/27/2021   PLT 127 (L) 02/27/2021   NEUTROABS 1.2 (L) 02/27/2021    CMP  Lab Results  Component Value Date   NA 140 02/27/2021   K 3.8 02/27/2021   CL 107 02/27/2021   CO2 25 02/27/2021   GLUCOSE 98 02/27/2021   BUN 11 02/27/2021   CREATININE 0.73 02/27/2021   CALCIUM 8.8 (L) 02/27/2021   PROT 6.9 02/27/2021   ALBUMIN 3.9 02/27/2021   AST 18 02/27/2021   ALT 17 02/27/2021   ALKPHOS 67 02/27/2021   BILITOT 0.3 02/27/2021   GFRNONAA >60 02/27/2021   GFRAA >60 08/10/2020    Medications: I have reviewed the patient's current medications.   Assessment/Plan: 1. left abdomen/pelvic pain ? CT abdomen/pelvis 06/26/2017-soft tissue implants in the lower anterior peritoneum with implants at the paracolic gutters and left pelvic sidewall ? NegativeMYRIADhereditary cancer panel ? Elevated CA 125 ? CT biopsy of anterior omental mass 07/04/2017-Metastatic carcinoma  consistent with a gynecologic primary ? 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 Cycle 1 adjuvant Taxol/carboplatin 08/05/2017 ? Cycle 2 adjuvant Taxol/carboplatin 08/26/2017 ? Cycle 3 adjuvant Taxol/carboplatin 09/17/2017-Taxol dose reduced secondary to neuropathy and bone pain ? Cycle 4 adjuvant Taxol/carboplatin 10/10/2017 ? Cycle 5 adjuvant Taxol/carboplatin 10/31/2017 ? Cycle 6 adjuvant Taxol/carboplatin 11/21/2017 ? CT abdomen/pelvis 08/08/2019-narrowing of rectosigmoid colon with eccentric serosal implant, 10 mm lymph node at the superior rectal vein, left upper quadrant soft tissue implant, 9 mm precaval node, 15 mm left internal iliac node, no ascites ? Cycle 1 Taxol/carboplatin 08/31/2019 ? Cycle 2 Taxol/carboplatin/Avastin 09/21/2019 ? Cycle 3 Taxol/Carboplatin 10/12/2019 (Avastin discontinued) ? Cycle 4 Taxol/carboplatin November 02, 2019 ? CT abdomen/pelvis 11/17/2019-decrease in size of previously prominent pericaval, left internal iliac, and superior rectal vein nodes, resolved rectosigmoid wall thickening ? Cycle 5 Taxol/carboplatin 11/22/2019 ? Cycle 6 Taxol/carboplatin 12/13/2019 ? Olaparib2/17/2021 ? CT abdomen/pelvis 07/10/2020-enlarging soft tissue at the distal left ureter, the ureter is not dilated, peritoneal implant adjacent to the splenic flexure, enlarged left superior rectal node, lymph nodes along the course of the distal IMV have enlarged, eccentric thickening of the sigmoid colon not seen  ? Olaraparib discontinued 07/12/2020 ? Cycle 1 Taxol/carboplatin 07/21/2020 ?  Cycle 2 Taxol/carboplatin 08/10/2020  (carboplatin dose reduced secondary to thrombocytopenia) ? Cycle 3 Taxol/carboplatin 09/05/2020 ? Cycle 4 Taxol/carboplatin 09/26/2020 ? Cycle 5 Taxol/carboplatin 10/24/2020 ? CT abdomen/pelvis 11/13/2020-overall stable disease, no new site of metastatic disease, generally stable small abdominal/pelvic lymph nodes, slight enlargement of a peritoneal implant in the left pelvis, slight decrease in size of another pelvic implant ? Cycle 6 Taxol/carboplatin 11/14/2020 ? Cycle 7 Taxol/carboplatin 12/05/2020 ? Cycle 8 Taxol/carboplatin 12/26/2020 ? Cycle 9 Taxol/carboplatin 01/16/2021 ? Cycle 10 Taxol/carboplatin 02/06/2021 ? Cycle 11 Taxol/carboplatin for 1222  2. Depression  3. Hypothyroid  4. Family history of uterine cancer-paternal grandmother  89.Acute onset dyspnea/pleuritic left-sided chest pain 10/02/2019  CT chest 10/04/2019-left lower lobe pulmonary emboli, multifocal pneumonia lower lobes  On Xarelto, status post course of Levaquin.  6.  Acute onset left lower back pain 01/24/2021-resolved after a Medrol Dosepak    Disposition: Anna [name] appears stable.  The CA-125 was stable last month and there is no clinical evidence of disease progression.  She will complete another cycle of Taxol/carboplatin today.  She will receive a Covid booster vaccine when she returns for [place] on 03/01/2021.  Anna [name] will return for an office visit and chemotherapy in 3 weeks.  We will plan for a restaging CT evaluation within the next 2-3 months.  [name], MD  02/27/2021  1:15 PM

## 2021-02-27 NOTE — Progress Notes (Signed)
Carbo dose just outside 10% by 2 mg due to decrease in Scr. Dr. Benay Spice to keep lower carbo dose.  Anna [name], PharmD

## 2021-03-01 ENCOUNTER — Other Ambulatory Visit: Payer: Self-pay

## 2021-03-01 ENCOUNTER — Ambulatory Visit: Payer: BC Managed Care – PPO | Attending: Internal Medicine

## 2021-03-01 ENCOUNTER — Ambulatory Visit: Payer: BC Managed Care – PPO

## 2021-03-01 ENCOUNTER — Inpatient Hospital Stay: Payer: BC Managed Care – PPO

## 2021-03-01 VITALS — BP 128/78 | HR 95 | Temp 98.5°F | Resp 16

## 2021-03-01 DIAGNOSIS — Z8781 Personal history of (healed) traumatic fracture: Secondary | ICD-10-CM | POA: Diagnosis not present

## 2021-03-01 DIAGNOSIS — Z23 Encounter for immunization: Secondary | ICD-10-CM

## 2021-03-01 DIAGNOSIS — C5702 Malignant neoplasm of left fallopian tube: Secondary | ICD-10-CM | POA: Diagnosis not present

## 2021-03-01 DIAGNOSIS — Z5111 Encounter for antineoplastic chemotherapy: Secondary | ICD-10-CM | POA: Diagnosis not present

## 2021-03-01 DIAGNOSIS — C786 Secondary malignant neoplasm of retroperitoneum and peritoneum: Secondary | ICD-10-CM | POA: Diagnosis not present

## 2021-03-01 DIAGNOSIS — Z5189 Encounter for other specified aftercare: Secondary | ICD-10-CM | POA: Diagnosis not present

## 2021-03-01 DIAGNOSIS — E039 Hypothyroidism, unspecified: Secondary | ICD-10-CM | POA: Diagnosis not present

## 2021-03-01 DIAGNOSIS — F32A Depression, unspecified: Secondary | ICD-10-CM | POA: Diagnosis not present

## 2021-03-01 DIAGNOSIS — Z8049 Family history of malignant neoplasm of other genital organs: Secondary | ICD-10-CM | POA: Diagnosis not present

## 2021-03-01 MED ORDER — PEGFILGRASTIM-CBQV 6 MG/0.6ML ~~LOC~~ SOSY
6.0000 mg | PREFILLED_SYRINGE | Freq: Once | SUBCUTANEOUS | Status: AC
  Administered 2021-03-01: 6 mg via SUBCUTANEOUS

## 2021-03-01 NOTE — Progress Notes (Signed)
   Covid-19 Vaccination Clinic  Name:  Troi Bechtold    MRN: [id number] DOB: 1956-09-16  03/01/2021  Ms. Firman was observed post Covid-19 immunization for 15 minutes without incident. She was provided with Vaccine Information Sheet and instruction to access the V-Safe system.   Ms. Frankland was instructed to call 911 with any severe reactions post vaccine: Marland Kitchen Difficulty breathing  . Swelling of face and throat  . A fast heartbeat  . A bad rash all over body  . Dizziness and weakness   Immunizations Administered    Name Date Dose VIS Date Route   PFIZER Comrnaty(Gray TOP) Covid-19 Vaccine 03/01/2021  8:33 AM 0.3 mL 10/26/2020 Intramuscular   Manufacturer: [place]   Lot: [id number]   NDC: [phone]

## 2021-03-02 ENCOUNTER — Other Ambulatory Visit (HOSPITAL_BASED_OUTPATIENT_CLINIC_OR_DEPARTMENT_OTHER): Payer: Self-pay

## 2021-03-02 MED ORDER — COVID-19 MRNA VACCINE (PFIZER) 30 MCG/0.3ML IM SUSP
INTRAMUSCULAR | 0 refills | Status: DC
  Filled 2021-03-02: qty 0.3, 1d supply, fill #0

## 2021-03-06 ENCOUNTER — Other Ambulatory Visit: Payer: Self-pay | Admitting: Internal Medicine

## 2021-03-09 LAB — CA 125: Cancer Antigen (CA) 125: 38.4 U/mL — ABNORMAL HIGH (ref 0.0–38.1)

## 2021-03-13 ENCOUNTER — Encounter: Payer: BC Managed Care – PPO | Admitting: Internal Medicine

## 2021-03-18 ENCOUNTER — Other Ambulatory Visit: Payer: Self-pay | Admitting: Oncology

## 2021-03-20 ENCOUNTER — Inpatient Hospital Stay: Payer: BC Managed Care – PPO

## 2021-03-20 ENCOUNTER — Other Ambulatory Visit: Payer: Self-pay

## 2021-03-20 ENCOUNTER — Inpatient Hospital Stay (HOSPITAL_BASED_OUTPATIENT_CLINIC_OR_DEPARTMENT_OTHER): Payer: BC Managed Care – PPO | Admitting: Oncology

## 2021-03-20 ENCOUNTER — Inpatient Hospital Stay: Payer: BC Managed Care – PPO | Attending: Oncology

## 2021-03-20 VITALS — BP 151/91 | HR 99 | Temp 98.0°F | Resp 20 | Ht 63.0 in | Wt 181.8 lb

## 2021-03-20 DIAGNOSIS — C5702 Malignant neoplasm of left fallopian tube: Secondary | ICD-10-CM | POA: Insufficient documentation

## 2021-03-20 DIAGNOSIS — R971 Elevated cancer antigen 125 [CA 125]: Secondary | ICD-10-CM | POA: Diagnosis not present

## 2021-03-20 DIAGNOSIS — Z7901 Long term (current) use of anticoagulants: Secondary | ICD-10-CM | POA: Insufficient documentation

## 2021-03-20 DIAGNOSIS — F329 Major depressive disorder, single episode, unspecified: Secondary | ICD-10-CM | POA: Diagnosis not present

## 2021-03-20 DIAGNOSIS — G629 Polyneuropathy, unspecified: Secondary | ICD-10-CM | POA: Diagnosis not present

## 2021-03-20 DIAGNOSIS — M549 Dorsalgia, unspecified: Secondary | ICD-10-CM | POA: Insufficient documentation

## 2021-03-20 DIAGNOSIS — Z5111 Encounter for antineoplastic chemotherapy: Secondary | ICD-10-CM | POA: Diagnosis not present

## 2021-03-20 DIAGNOSIS — Z86711 Personal history of pulmonary embolism: Secondary | ICD-10-CM | POA: Insufficient documentation

## 2021-03-20 DIAGNOSIS — E039 Hypothyroidism, unspecified: Secondary | ICD-10-CM

## 2021-03-20 DIAGNOSIS — Z808 Family history of malignant neoplasm of other organs or systems: Secondary | ICD-10-CM | POA: Diagnosis not present

## 2021-03-20 LAB — TSH: TSH: 15.299 u[IU]/mL — ABNORMAL HIGH (ref 0.350–4.500)

## 2021-03-20 LAB — CMP (CANCER CENTER ONLY)
ALT: 20 U/L (ref 0–44)
AST: 20 U/L (ref 15–41)
Albumin: 3.9 g/dL (ref 3.5–5.0)
Alkaline Phosphatase: 81 U/L (ref 38–126)
Anion gap: 10 (ref 5–15)
BUN: 15 mg/dL (ref 8–23)
CO2: 26 mmol/L (ref 22–32)
Calcium: 9.4 mg/dL (ref 8.9–10.3)
Chloride: 103 mmol/L (ref 98–111)
Creatinine: 1.01 mg/dL — ABNORMAL HIGH (ref 0.44–1.00)
GFR, Estimated: >60 mL/min (ref >60)
Glucose, Bld: 104 mg/dL — ABNORMAL HIGH (ref 70–99)
Potassium: 3.6 mmol/L (ref 3.5–5.1)
Sodium: 139 mmol/L (ref 135–145)
Total Bilirubin: 0.3 mg/dL (ref 0.3–1.2)
Total Protein: 6.8 g/dL (ref 6.5–8.1)

## 2021-03-20 LAB — CBC WITH DIFFERENTIAL (CANCER CENTER ONLY)
Abs Immature Granulocytes: 0.02 10*3/uL (ref 0.00–0.07)
Basophils Absolute: 0 10*3/uL (ref 0.0–0.1)
Basophils Relative: 1 %
Eosinophils Absolute: 0 10*3/uL (ref 0.0–0.5)
Eosinophils Relative: 1 %
HCT: 27.6 % — ABNORMAL LOW (ref 36.0–46.0)
Hemoglobin: 8.7 g/dL — ABNORMAL LOW (ref 12.0–15.0)
Immature Granulocytes: 0 %
Lymphocytes Relative: 44 %
Lymphs Abs: 2.1 10*3/uL (ref 0.7–4.0)
MCH: 36 pg — ABNORMAL HIGH (ref 26.0–34.0)
MCHC: 31.5 g/dL (ref 30.0–36.0)
MCV: 114 fL — ABNORMAL HIGH (ref 80.0–100.0)
Monocytes Absolute: 0.7 10*3/uL (ref 0.1–1.0)
Monocytes Relative: 15 %
Neutro Abs: 1.8 10*3/uL (ref 1.7–7.7)
Neutrophils Relative %: 39 %
Platelet Count: 146 10*3/uL — ABNORMAL LOW (ref 150–400)
RBC: 2.42 MIL/uL — ABNORMAL LOW (ref 3.87–5.11)
RDW: 16 % — ABNORMAL HIGH (ref 11.5–15.5)
WBC Count: 4.7 10*3/uL (ref 4.0–10.5)
nRBC: 0 % (ref 0.0–0.2)

## 2021-03-20 MED ORDER — SODIUM CHLORIDE 0.9 % IV SOLN
140.0000 mg/m2 | Freq: Once | INTRAVENOUS | Status: AC
  Administered 2021-03-20: 258 mg via INTRAVENOUS
  Filled 2021-03-20: qty 43

## 2021-03-20 MED ORDER — SODIUM CHLORIDE 0.9% FLUSH
10.0000 mL | INTRAVENOUS | Status: DC | PRN
Start: 2021-03-20 — End: 2021-03-20
  Administered 2021-03-20: 10 mL
  Filled 2021-03-20: qty 10

## 2021-03-20 MED ORDER — FAMOTIDINE IN NACL 20-0.9 MG/50ML-% IV SOLN
20.0000 mg | Freq: Once | INTRAVENOUS | Status: AC
Start: 2021-03-20 — End: 2021-03-20
  Administered 2021-03-20: 20 mg via INTRAVENOUS

## 2021-03-20 MED ORDER — HEPARIN SOD (PORK) LOCK FLUSH 100 UNIT/ML IV SOLN
500.0000 [IU] | Freq: Once | INTRAVENOUS | Status: AC | PRN
Start: 2021-03-20 — End: 2021-03-20
  Administered 2021-03-20: 500 [IU]
  Filled 2021-03-20: qty 5

## 2021-03-20 MED ORDER — PALONOSETRON HCL INJECTION 0.25 MG/5ML
INTRAVENOUS | Status: AC
  Filled 2021-03-20: qty 5

## 2021-03-20 MED ORDER — SODIUM CHLORIDE 0.9 % IV SOLN
Freq: Once | INTRAVENOUS | Status: AC
  Filled 2021-03-20: qty 250

## 2021-03-20 MED ORDER — DIPHENHYDRAMINE HCL 50 MG/ML IJ SOLN
50.0000 mg | Freq: Once | INTRAMUSCULAR | Status: AC
  Administered 2021-03-20: 50 mg via INTRAVENOUS

## 2021-03-20 MED ORDER — SODIUM CHLORIDE 0.9 % IV SOLN
340.0000 mg | Freq: Once | INTRAVENOUS | Status: AC
  Administered 2021-03-20: 340 mg via INTRAVENOUS
  Filled 2021-03-20: qty 34

## 2021-03-20 MED ORDER — FAMOTIDINE IN NACL 20-0.9 MG/50ML-% IV SOLN
INTRAVENOUS | Status: AC
  Filled 2021-03-20: qty 50

## 2021-03-20 MED ORDER — SODIUM CHLORIDE 0.9 % IV SOLN
10.0000 mg | Freq: Once | INTRAVENOUS | Status: AC
  Administered 2021-03-20: 10 mg via INTRAVENOUS
  Filled 2021-03-20: qty 1

## 2021-03-20 MED ORDER — DIPHENHYDRAMINE HCL 50 MG/ML IJ SOLN
INTRAMUSCULAR | Status: AC
  Filled 2021-03-20: qty 1

## 2021-03-20 MED ORDER — FOSAPREPITANT DIMEGLUMINE INJECTION 150 MG
150.0000 mg | Freq: Once | INTRAVENOUS | Status: AC
Start: 2021-03-20 — End: 2021-03-20
  Administered 2021-03-20: 150 mg via INTRAVENOUS
  Filled 2021-03-20: qty 5

## 2021-03-20 MED ORDER — PALONOSETRON HCL INJECTION 0.25 MG/5ML
0.2500 mg | Freq: Once | INTRAVENOUS | Status: AC
Start: 2021-03-20 — End: 2021-03-20
  Administered 2021-03-20: 0.25 mg via INTRAVENOUS

## 2021-03-20 NOTE — Patient Instructions (Signed)
[facility]  Discharge Instructions: Thank you for choosing [facility] to provide your oncology and hematology care.   If you have a lab appointment with the [facility], please go directly to the [facility] and check in at the registration area.   Wear comfortable clothing and clothing appropriate for easy access to any Portacath or PICC line.   We strive to give you quality time with your provider. You may need to reschedule your appointment if you arrive late (15 or more minutes).  Arriving late affects you and other patients whose appointments are after yours.  Also, if you miss three or more appointments without notifying the office, you may be dismissed from the clinic at the provider's discretion.      For prescription refill requests, have your pharmacy contact our office and allow 72 hours for refills to be completed.    Today you received the following chemotherapy and/or immunotherapy agents Taxol and Carboplatin      To help prevent nausea and vomiting after your treatment, we encourage you to take your nausea medication as directed.  BELOW ARE SYMPTOMS THAT SHOULD BE REPORTED IMMEDIATELY: . *FEVER GREATER THAN 100.4 F (38 C) OR HIGHER . *CHILLS OR SWEATING . *NAUSEA AND VOMITING THAT IS NOT CONTROLLED WITH YOUR NAUSEA MEDICATION . *UNUSUAL SHORTNESS OF BREATH . *UNUSUAL BRUISING OR BLEEDING . *URINARY PROBLEMS (pain or burning when urinating, or frequent urination) . *BOWEL PROBLEMS (unusual diarrhea, constipation, pain near the anus) . TENDERNESS IN MOUTH AND THROAT WITH OR WITHOUT PRESENCE OF ULCERS (sore throat, sores in mouth, or a toothache) . UNUSUAL RASH, SWELLING OR PAIN  . UNUSUAL VAGINAL DISCHARGE OR ITCHING   Items with * indicate a potential emergency and should be followed up as soon as possible or go to the Emergency Department if any problems should occur.  Please show the CHEMOTHERAPY ALERT CARD or  IMMUNOTHERAPY ALERT CARD at check-in to the Emergency Department and triage nurse.  Should you have questions after your visit or need to cancel or reschedule your appointment, please contact [facility]  Dept: [phone]  and follow the prompts.  Office hours are 8:00 a.m. to 4:30 p.m. Monday - Friday. Please note that voicemails left after 4:00 p.m. may not be returned until the following business day.  We are closed weekends and major holidays. You have access to a nurse at all times for urgent questions. Please call the main number to the clinic Dept: [phone] and follow the prompts.   For any non-urgent questions, you may also contact your provider using MyChart. We now offer e-Visits for anyone 57 and older to request care online for non-urgent symptoms. For details visit [email or web address].   Also download the MyChart app! Go to the app store, search "MyChart", open the app, select [facility], and log in with your MyChart username and password.  Due to Covid, a mask is required upon entering the hospital/clinic. If you do not have a mask, one will be given to you upon arrival. For doctor visits, patients may have 1 support person aged 57 or older with them. For treatment visits, patients cannot have anyone with them due to current Covid guidelines and our immunocompromised population.   Paclitaxel injection What is this medicine? PACLITAXEL (PAK li TAX el) is a chemotherapy drug. It targets fast dividing cells, like cancer cells, and causes these cells to die. This medicine is used to treat ovarian cancer,  breast cancer, lung cancer, Kaposi's sarcoma, and other cancers. This medicine may be used for other purposes; ask your health care provider or pharmacist if you have questions. COMMON BRAND NAME(S): Onxol, Taxol What should I tell my health care provider before I take this medicine? They need to know if you have any of these conditions:  history of  irregular heartbeat  liver disease  low blood counts, like low white cell, platelet, or red cell counts  lung or breathing disease, like asthma  tingling of the fingers or toes, or other nerve disorder  an unusual or allergic reaction to paclitaxel, alcohol, polyoxyethylated castor oil, other chemotherapy, other medicines, foods, dyes, or preservatives  pregnant or trying to get pregnant  breast-feeding How should I use this medicine? This drug is given as an infusion into a vein. It is administered in a hospital or clinic by a specially trained health care professional. Talk to your pediatrician regarding the use of this medicine in children. Special care may be needed. Overdosage: If you think you have taken too much of this medicine contact a poison control center or emergency room at once. NOTE: This medicine is only for you. Do not share this medicine with others. What if I miss a dose? It is important not to miss your dose. Call your doctor or health care professional if you are unable to keep an appointment. What may interact with this medicine? Do not take this medicine with any of the following medications:  live virus vaccines This medicine may also interact with the following medications:  antiviral medicines for hepatitis, HIV or AIDS  certain antibiotics like erythromycin and clarithromycin  certain medicines for fungal infections like ketoconazole and itraconazole  certain medicines for seizures like carbamazepine, phenobarbital, phenytoin  gemfibrozil  nefazodone  rifampin  St. John's wort This list may not describe all possible interactions. Give your health care provider a list of all the medicines, herbs, non-prescription drugs, or dietary supplements you use. Also tell them if you smoke, drink alcohol, or use illegal drugs. Some items may interact with your medicine. What should I watch for while using this medicine? Your condition will be monitored  carefully while you are receiving this medicine. You will need important blood work done while you are taking this medicine. This medicine can cause serious allergic reactions. To reduce your risk you will need to take other medicine(s) before treatment with this medicine. If you experience allergic reactions like skin rash, itching or hives, swelling of the face, lips, or tongue, tell your doctor or health care professional right away. In some cases, you may be given additional medicines to help with side effects. Follow all directions for their use. This drug may make you feel generally unwell. This is not uncommon, as chemotherapy can affect healthy cells as well as cancer cells. Report any side effects. Continue your course of treatment even though you feel ill unless your doctor tells you to stop. Call your doctor or health care professional for advice if you get a fever, chills or sore throat, or other symptoms of a cold or flu. Do not treat yourself. This drug decreases your body's ability to fight infections. Try to avoid being around people who are sick. This medicine may increase your risk to bruise or bleed. Call your doctor or health care professional if you notice any unusual bleeding. Be careful brushing and flossing your teeth or using a toothpick because you may get an infection or bleed more easily.  If you have any dental work done, tell your dentist you are receiving this medicine. Avoid taking products that contain aspirin, acetaminophen, ibuprofen, naproxen, or ketoprofen unless instructed by your doctor. These medicines may hide a fever. Do not become pregnant while taking this medicine. Women should inform their doctor if they wish to become pregnant or think they might be pregnant. There is a potential for serious side effects to an unborn child. Talk to your health care professional or pharmacist for more information. Do not breast-feed an infant while taking this medicine. Men are  advised not to father a child while receiving this medicine. This product may contain alcohol. Ask your pharmacist or healthcare provider if this medicine contains alcohol. Be sure to tell all healthcare providers you are taking this medicine. Certain medicines, like metronidazole and disulfiram, can cause an unpleasant reaction when taken with alcohol. The reaction includes flushing, headache, nausea, vomiting, sweating, and increased thirst. The reaction can last from 30 minutes to several hours. What side effects may I notice from receiving this medicine? Side effects that you should report to your doctor or health care professional as soon as possible:  allergic reactions like skin rash, itching or hives, swelling of the face, lips, or tongue  breathing problems  changes in vision  fast, irregular heartbeat  high or low blood pressure  mouth sores  pain, tingling, numbness in the hands or feet  signs of decreased platelets or bleeding - bruising, pinpoint red spots on the skin, black, tarry stools, blood in the urine  signs of decreased red blood cells - unusually weak or tired, feeling faint or lightheaded, falls  signs of infection - fever or chills, cough, sore throat, pain or difficulty passing urine  signs and symptoms of liver injury like dark yellow or brown urine; general ill feeling or flu-like symptoms; light-colored stools; loss of appetite; nausea; right upper belly pain; unusually weak or tired; yellowing of the eyes or skin  swelling of the ankles, feet, hands  unusually slow heartbeat Side effects that usually do not require medical attention (report to your doctor or health care professional if they continue or are bothersome):  diarrhea  hair loss  loss of appetite  muscle or joint pain  nausea, vomiting  pain, redness, or irritation at site where injected  tiredness This list may not describe all possible side effects. Call your doctor for medical  advice about side effects. You may report side effects to FDA at 1-800-FDA-1088. Where should I keep my medicine? This drug is given in a hospital or clinic and will not be stored at home. NOTE: This sheet is a summary. It may not cover all possible information. If you have questions about this medicine, talk to your doctor, pharmacist, or health care provider.  2021 Elsevier/Gold Standard (2019-10-06 13:37:23)   Carboplatin injection What is this medicine? CARBOPLATIN (KAR boe pla tin) is a chemotherapy drug. It targets fast dividing cells, like cancer cells, and causes these cells to die. This medicine is used to treat ovarian cancer and many other cancers. This medicine may be used for other purposes; ask your health care provider or pharmacist if you have questions. COMMON BRAND NAME(S): Paraplatin What should I tell my health care provider before I take this medicine? They need to know if you have any of these conditions:  blood disorders  hearing problems  kidney disease  recent or ongoing radiation therapy  an unusual or allergic reaction to carboplatin, cisplatin, other chemotherapy,  other medicines, foods, dyes, or preservatives  pregnant or trying to get pregnant  breast-feeding How should I use this medicine? This drug is usually given as an infusion into a vein. It is administered in a hospital or clinic by a specially trained health care professional. Talk to your pediatrician regarding the use of this medicine in children. Special care may be needed. Overdosage: If you think you have taken too much of this medicine contact a poison control center or emergency room at once. NOTE: This medicine is only for you. Do not share this medicine with others. What if I miss a dose? It is important not to miss a dose. Call your doctor or health care professional if you are unable to keep an appointment. What may interact with this medicine?  medicines for seizures  medicines  to increase blood counts like filgrastim, pegfilgrastim, sargramostim  some antibiotics like amikacin, gentamicin, neomycin, streptomycin, tobramycin  vaccines Talk to your doctor or health care professional before taking any of these medicines:  acetaminophen  aspirin  ibuprofen  ketoprofen  naproxen This list may not describe all possible interactions. Give your health care provider a list of all the medicines, herbs, non-prescription drugs, or dietary supplements you use. Also tell them if you smoke, drink alcohol, or use illegal drugs. Some items may interact with your medicine. What should I watch for while using this medicine? Your condition will be monitored carefully while you are receiving this medicine. You will need important blood work done while you are taking this medicine. This drug may make you feel generally unwell. This is not uncommon, as chemotherapy can affect healthy cells as well as cancer cells. Report any side effects. Continue your course of treatment even though you feel ill unless your doctor tells you to stop. In some cases, you may be given additional medicines to help with side effects. Follow all directions for their use. Call your doctor or health care professional for advice if you get a fever, chills or sore throat, or other symptoms of a cold or flu. Do not treat yourself. This drug decreases your body's ability to fight infections. Try to avoid being around people who are sick. This medicine may increase your risk to bruise or bleed. Call your doctor or health care professional if you notice any unusual bleeding. Be careful brushing and flossing your teeth or using a toothpick because you may get an infection or bleed more easily. If you have any dental work done, tell your dentist you are receiving this medicine. Avoid taking products that contain aspirin, acetaminophen, ibuprofen, naproxen, or ketoprofen unless instructed by your doctor. These medicines  may hide a fever. Do not become pregnant while taking this medicine. Women should inform their doctor if they wish to become pregnant or think they might be pregnant. There is a potential for serious side effects to an unborn child. Talk to your health care professional or pharmacist for more information. Do not breast-feed an infant while taking this medicine. What side effects may I notice from receiving this medicine? Side effects that you should report to your doctor or health care professional as soon as possible:  allergic reactions like skin rash, itching or hives, swelling of the face, lips, or tongue  signs of infection - fever or chills, cough, sore throat, pain or difficulty passing urine  signs of decreased platelets or bleeding - bruising, pinpoint red spots on the skin, black, tarry stools, nosebleeds  signs of decreased red blood  cells - unusually weak or tired, fainting spells, lightheadedness  breathing problems  changes in hearing  changes in vision  chest pain  high blood pressure  low blood counts - This drug may decrease the number of white blood cells, red blood cells and platelets. You may be at increased risk for infections and bleeding.  nausea and vomiting  pain, swelling, redness or irritation at the injection site  pain, tingling, numbness in the hands or feet  problems with balance, talking, walking  trouble passing urine or change in the amount of urine Side effects that usually do not require medical attention (report to your doctor or health care professional if they continue or are bothersome):  hair loss  loss of appetite  metallic taste in the mouth or changes in taste This list may not describe all possible side effects. Call your doctor for medical advice about side effects. You may report side effects to FDA at 1-800-FDA-1088. Where should I keep my medicine? This drug is given in a hospital or clinic and will not be stored at  home. NOTE: This sheet is a summary. It may not cover all possible information. If you have questions about this medicine, talk to your doctor, pharmacist, or health care provider.  2021 Elsevier/Gold Standard (2008-02-09 14:38:05)

## 2021-03-20 NOTE — Progress Notes (Signed)
[facility] OFFICE PROGRESS NOTE   Diagnosis: Fallopian tube carcinoma  INTERVAL HISTORY:   [name] completed another cycle of Taxol/carboplatin on 02/27/2021.  She had an episode of nausea/vomiting on day 5.  No other nausea.  She received G-CSF on 03/01/2021.  No bone pain.  No bleeding.  No other complaint.  Objective:  Vital signs in last 24 hours:  Blood pressure (!) 151/91, pulse 99, temperature 98 F (36.7 C), temperature source Oral, resp. rate 20, height 5' 3"  (1.6 m), weight 181 lb 12.8 oz (82.5 kg), SpO2 99 %.    HEENT: Healing 3 mm ulcer at the posterior left buccal mucosa, mild white coat over the tongue Resp: Lungs clear bilaterally Cardio: Regular rate and rhythm GI: No hepatosplenomegaly, no apparent ascites Vascular: No leg edema  Portacath/PICC-without erythema  Lab Results:  Lab Results  Component Value Date   WBC 4.7 03/20/2021   HGB 8.7 (L) 03/20/2021   HCT 27.6 (L) 03/20/2021   MCV 114.0 (H) 03/20/2021   PLT 146 (L) 03/20/2021   NEUTROABS 1.8 03/20/2021    CMP  Lab Results  Component Value Date   NA 140 02/27/2021   K 3.8 02/27/2021   CL 107 02/27/2021   CO2 25 02/27/2021   GLUCOSE 98 02/27/2021   BUN 11 02/27/2021   CREATININE 0.73 02/27/2021   CALCIUM 8.8 (L) 02/27/2021   PROT 6.9 02/27/2021   ALBUMIN 3.9 02/27/2021   AST 18 02/27/2021   ALT 17 02/27/2021   ALKPHOS 67 02/27/2021   BILITOT 0.3 02/27/2021   GFRNONAA >60 02/27/2021   GFRAA >60 08/10/2020    Lab Results  Component Value Date   CEA1 <1.00 06/27/2017    Medications: I have reviewed the patient's current medications.   Assessment/Plan: 1. left abdomen/pelvic pain ? CT abdomen/pelvis 06/26/2017-soft tissue implants in the lower anterior peritoneum with implants at the paracolic gutters and left pelvic sidewall ? NegativeMYRIADhereditary cancer panel ? Elevated CA 125 ? CT biopsy of anterior omental mass 07/04/2017-Metastatic carcinoma  consistent with a gynecologic primary ? 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 Cycle 1 adjuvant Taxol/carboplatin 08/05/2017 ? Cycle 2 adjuvant Taxol/carboplatin 08/26/2017 ? Cycle 3 adjuvant Taxol/carboplatin 09/17/2017-Taxol dose reduced secondary to neuropathy and bone pain ? Cycle 4 adjuvant Taxol/carboplatin 10/10/2017 ? Cycle 5 adjuvant Taxol/carboplatin 10/31/2017 ? Cycle 6 adjuvant Taxol/carboplatin 11/21/2017 ? CT abdomen/pelvis 08/08/2019-narrowing of rectosigmoid colon with eccentric serosal implant, 10 mm lymph node at the superior rectal vein, left upper quadrant soft tissue implant, 9 mm precaval node, 15 mm left internal iliac node, no ascites ? Cycle 1 Taxol/carboplatin 08/31/2019 ? Cycle 2 Taxol/carboplatin/Avastin 09/21/2019 ? Cycle 3 Taxol/Carboplatin 10/12/2019 (Avastin discontinued) ? Cycle 4 Taxol/carboplatin November 02, 2019 ? CT abdomen/pelvis 11/17/2019-decrease in size of previously prominent pericaval, left internal iliac, and superior rectal vein nodes, resolved rectosigmoid wall thickening ? Cycle 5 Taxol/carboplatin 11/22/2019 ? Cycle 6 Taxol/carboplatin 12/13/2019 ? Olaparib2/17/2021 ? CT abdomen/pelvis 07/10/2020-enlarging soft tissue at the distal left ureter, the ureter is not dilated, peritoneal implant adjacent to the splenic flexure, enlarged left superior rectal node, lymph nodes along the course of the distal IMV have enlarged, eccentric thickening of the sigmoid colon not seen  ? Leandro Reasoner  discontinued 07/12/2020 ? Cycle 1 Taxol/carboplatin 07/21/2020 ? Cycle 2 Taxol/carboplatin 08/10/2020  (carboplatin dose reduced secondary to thrombocytopenia) ? Cycle 3 Taxol/carboplatin 09/05/2020 ? Cycle 4 Taxol/carboplatin 09/26/2020 ? Cycle 5 Taxol/carboplatin 10/24/2020 ? CT abdomen/pelvis 11/13/2020-overall stable disease, no new site of metastatic disease, generally stable small abdominal/pelvic lymph nodes, slight enlargement of a peritoneal implant in the left pelvis, slight decrease in size of another pelvic implant ? Cycle 6 Taxol/carboplatin 11/14/2020 ? Cycle 7 Taxol/carboplatin 12/05/2020 ? Cycle 8 Taxol/carboplatin 12/26/2020 ? Cycle 9 Taxol/carboplatin 01/16/2021 ? Cycle 10 Taxol/carboplatin 02/06/2021 ? Cycle 11 Taxol/carboplatin 02/27/2021 ? Cycle 12 Taxol/carboplatin 03/20/2021  2. Depression  3. Hypothyroid  4. Family history of uterine cancer-paternal grandmother  63.Acute onset dyspnea/pleuritic left-sided chest pain 10/02/2019  CT chest 10/04/2019-left lower lobe pulmonary emboli, multifocal pneumonia lower lobes  On Xarelto, status post course of Levaquin.  6.  Acute onset left lower back pain 01/24/2021-resolved after a Medrol Dosepak      Disposition: Anna [name] appears stable.  She will complete another cycle of Taxol/carboplatin today.  The CA125 was lower when she was here 3 weeks ago.  We will follow up on the CA125 from today.  Anna [name] will return for an office visit and chemotherapy in 3 weeks.  The plan is to continue Taxol/carboplatin as long as there is no clinical or CA125 evidence of disease progression.  We will plan for a restaging CT within the next few months.  [name], MD  03/20/2021  8:53 AM

## 2021-03-20 NOTE — Progress Notes (Signed)
Ok to keep same [place] dose Roselind Messier, PharmD

## 2021-03-21 ENCOUNTER — Telehealth: Payer: Self-pay | Admitting: *Deleted

## 2021-03-21 ENCOUNTER — Encounter: Payer: Self-pay | Admitting: Internal Medicine

## 2021-03-21 LAB — CA 125: Cancer Antigen (CA) 125: 39.6 U/mL — ABNORMAL HIGH (ref 0.0–38.1)

## 2021-03-21 NOTE — Telephone Encounter (Signed)
Yes.  What works better for her?

## 2021-03-21 NOTE — Telephone Encounter (Signed)
Notified of stable CA125 and elevated TSH and may need dose change of Euthyrox. Confirmed PCP is Dr. Nicki Reaper. Forwarded lab to PCP and patient will call office to follow up.

## 2021-03-21 NOTE — Telephone Encounter (Signed)
Dr Benay Spice sent labs to me for f/u.  I have not seen her in a while (she had told me should would be seeing oncology and would f/u with me when needed).  Given lab and given amount of time I have not seen her, see if she would be agreeable to schedule appt.

## 2021-03-21 NOTE — Telephone Encounter (Signed)
Mychart sent to pt.

## 2021-03-21 NOTE — Telephone Encounter (Signed)
Can we add her on at 4:30 tomorrow or 4:00 on Friday?

## 2021-03-21 NOTE — Telephone Encounter (Signed)
-----   Message from Ladell Pier, MD sent at 03/21/2021  6:54 AM EDT ----- Please call patient, ca125 is stable, TSH high- may need to adjust thyroid hormone dose, copy TSH to primary MD

## 2021-03-22 ENCOUNTER — Encounter: Payer: Self-pay | Admitting: Internal Medicine

## 2021-03-22 ENCOUNTER — Inpatient Hospital Stay: Payer: BC Managed Care – PPO

## 2021-03-22 ENCOUNTER — Other Ambulatory Visit: Payer: Self-pay

## 2021-03-22 ENCOUNTER — Telehealth (INDEPENDENT_AMBULATORY_CARE_PROVIDER_SITE_OTHER): Payer: BC Managed Care – PPO | Admitting: Internal Medicine

## 2021-03-22 VITALS — BP 145/86 | HR 102 | Temp 98.5°F | Resp 20

## 2021-03-22 DIAGNOSIS — Z86010 Personal history of colonic polyps: Secondary | ICD-10-CM

## 2021-03-22 DIAGNOSIS — C5700 Malignant neoplasm of unspecified fallopian tube: Secondary | ICD-10-CM

## 2021-03-22 DIAGNOSIS — K219 Gastro-esophageal reflux disease without esophagitis: Secondary | ICD-10-CM

## 2021-03-22 DIAGNOSIS — E039 Hypothyroidism, unspecified: Secondary | ICD-10-CM | POA: Diagnosis not present

## 2021-03-22 DIAGNOSIS — M549 Dorsalgia, unspecified: Secondary | ICD-10-CM | POA: Diagnosis not present

## 2021-03-22 DIAGNOSIS — G629 Polyneuropathy, unspecified: Secondary | ICD-10-CM | POA: Diagnosis not present

## 2021-03-22 DIAGNOSIS — C569 Malignant neoplasm of unspecified ovary: Secondary | ICD-10-CM

## 2021-03-22 DIAGNOSIS — Z86711 Personal history of pulmonary embolism: Secondary | ICD-10-CM | POA: Diagnosis not present

## 2021-03-22 DIAGNOSIS — Z808 Family history of malignant neoplasm of other organs or systems: Secondary | ICD-10-CM | POA: Diagnosis not present

## 2021-03-22 DIAGNOSIS — F329 Major depressive disorder, single episode, unspecified: Secondary | ICD-10-CM | POA: Diagnosis not present

## 2021-03-22 DIAGNOSIS — C5702 Malignant neoplasm of left fallopian tube: Secondary | ICD-10-CM | POA: Diagnosis not present

## 2021-03-22 DIAGNOSIS — F32A Depression, unspecified: Secondary | ICD-10-CM

## 2021-03-22 DIAGNOSIS — I10 Essential (primary) hypertension: Secondary | ICD-10-CM | POA: Diagnosis not present

## 2021-03-22 DIAGNOSIS — E7800 Pure hypercholesterolemia, unspecified: Secondary | ICD-10-CM

## 2021-03-22 DIAGNOSIS — F320 Major depressive disorder, single episode, mild: Secondary | ICD-10-CM

## 2021-03-22 DIAGNOSIS — R971 Elevated cancer antigen 125 [CA 125]: Secondary | ICD-10-CM | POA: Diagnosis not present

## 2021-03-22 DIAGNOSIS — Z5111 Encounter for antineoplastic chemotherapy: Secondary | ICD-10-CM | POA: Diagnosis not present

## 2021-03-22 DIAGNOSIS — Z7901 Long term (current) use of anticoagulants: Secondary | ICD-10-CM | POA: Diagnosis not present

## 2021-03-22 MED ORDER — PEGFILGRASTIM-CBQV 6 MG/0.6ML ~~LOC~~ SOSY
6.0000 mg | PREFILLED_SYRINGE | Freq: Once | SUBCUTANEOUS | Status: AC
  Administered 2021-03-22: 6 mg via SUBCUTANEOUS

## 2021-03-22 NOTE — Patient Instructions (Signed)
[facility]    Discharge Instructions:  Thank you for choosing [facility] to provide your oncology and hematology care.   If you have a lab appointment with the [facility], please go directly to the [facility] and check in at the registration area.   Wear comfortable clothing and clothing appropriate for easy access to any Portacath or PICC line.   We strive to give you quality time with your provider. You may need to reschedule your appointment if you arrive late (15 or more minutes).  Arriving late affects you and other patients whose appointments are after yours.  Also, if you miss three or more appointments without notifying the office, you may be dismissed from the clinic at the provider's discretion.      For prescription refill requests, have your pharmacy contact our office and allow 72 hours for refills to be completed.    Today you received the following chemotherapy and/or immunotherapy agents [id number] Ellen Henri)    To help prevent nausea and vomiting after your treatment, we encourage you to take your nausea medication as directed.  BELOW ARE SYMPTOMS THAT SHOULD BE REPORTED IMMEDIATELY: . *FEVER GREATER THAN 100.4 F (38 C) OR HIGHER . *CHILLS OR SWEATING . *NAUSEA AND VOMITING THAT IS NOT CONTROLLED WITH YOUR NAUSEA MEDICATION . *UNUSUAL SHORTNESS OF BREATH . *UNUSUAL BRUISING OR BLEEDING . *URINARY PROBLEMS (pain or burning when urinating, or frequent urination) . *BOWEL PROBLEMS (unusual diarrhea, constipation, pain near the anus) . TENDERNESS IN MOUTH AND THROAT WITH OR WITHOUT PRESENCE OF ULCERS (sore throat, sores in mouth, or a toothache) . UNUSUAL RASH, SWELLING OR PAIN  . UNUSUAL VAGINAL DISCHARGE OR ITCHING   Items with * indicate a potential emergency and should be followed up as soon as possible or go to the Emergency Department if any problems should occur.  Please show the CHEMOTHERAPY ALERT CARD or  IMMUNOTHERAPY ALERT CARD at check-in to the Emergency Department and triage nurse.  Should you have questions after your visit or need to cancel or reschedule your appointment, please contact [facility]  Dept: [phone]  and follow the prompts.  Office hours are 8:00 a.m. to 4:30 p.m. Monday - Friday. Please note that voicemails left after 4:00 p.m. may not be returned until the following business day.  We are closed weekends and major holidays. You have access to a nurse at all times for urgent questions. Please call the main number to the clinic Dept: [phone] and follow the prompts.   For any non-urgent questions, you may also contact your provider using MyChart. We now offer e-Visits for anyone 65 and older to request care online for non-urgent symptoms. For details visit [email or web address].   Also download the MyChart app! Go to the app store, search "MyChart", open the app, select [facility], and log in with your MyChart username and password.  Due to Covid, a mask is required upon entering the hospital/clinic. If you do not have a mask, one will be given to you upon arrival. For doctor visits, patients may have 1 support person aged 4 or older with them. For treatment visits, patients cannot have anyone with them due to current Covid guidelines and our immunocompromised population.

## 2021-03-22 NOTE — Progress Notes (Signed)
Patient ID: Anna [name], female   DOB: 10-07-1956, 65 y.o.   MRN: [id number]   Virtual Visit via telephone Note  This visit type was conducted due to national recommendations for restrictions regarding the COVID-19 pandemic (e.g. social distancing).  This format is felt to be most appropriate for this patient at this time.  All issues noted in this document were discussed and addressed.  No physical exam was performed (except for noted visual exam findings with Video Visits).   I connected with Anna [name] by telephone and verified that I am speaking with the correct person using two identifiers. Location patient: home Location provider: work  Persons participating in the telephone visit: patient, provider  The limitations, risks, security and privacy concerns of performing an evaluation and management service by telephone and the availability of in person appointments have been discussed.  It has also been discussed with the patient that there may be a patient responsible charge related to this service. The patient expressed understanding and agreed to proceed.   Reason for visit: work in appt  HPI: Work in to discuss recent labs - TSH elevated.  Seeing Dr Benay Spice for treatment of fallopian tube carcinoma.  Receiving chemotherapy.  Appears to be tolerating relatively well.  Some acid reflux after infusion.  Taking miralax prn to help with bowels.  Tries to stay active.  No chest pain or sob reported.  Blood pressures averaging 120-130/85.  Eating.  Discussed labs.  Discussed the need to adjust thyroid medication given elevated tsh.     ROS: See pertinent positives and negatives per HPI.  Past Medical History:  Diagnosis Date  . Anxiety   . Arthritis   . Depression   . Diverticulosis   . Fallopian tube carcinoma, left ([facility]) 2018  . Family history of adverse reaction to anesthesia    Mother has extreme [place] with anesthesia  . Family history of breast cancer   .  Frequent headaches    H/O  . Genetic testing 11/07/2017  . GERD (gastroesophageal reflux disease)   . Heart murmur    Hx of  . History of chicken pox   . Hypercholesterolemia   . Hypertension   . Hypothyroidism   . Pneumonia   . Sleep apnea    No longer wears cpap    Past Surgical History:  Procedure Laterality Date  . ABDOMINAL HYSTERECTOMY     Dr. Denman George 07/17/17  . APPENDECTOMY  1981  . CARPAL TUNNEL RELEASE  2000  . [facility]  . EYE SURGERY Bilateral 2008   Lasix eye srg  . IR IMAGING GUIDED PORT INSERTION  08/26/2019  . LAPAROSCOPY N/A 07/17/2017   Procedure: LAPAROSCOPY DIAGNOSTIC;  Surgeon: Everitt Amber, MD;  Location: [facility];  Service: Gynecology;  Laterality: N/A;  . LAPAROTOMY N/A 07/17/2017   Procedure: EXPLORATORY LAPAROTOMY;  Surgeon: Everitt Amber, MD;  Location: [facility];  Service: Gynecology;  Laterality: N/A;  . OMENTECTOMY N/A 07/17/2017   Procedure: OMENTECTOMY WITH RADICAL TUMOR DEBULKING;  Surgeon: Everitt Amber, MD;  Location: [facility];  Service: Gynecology;  Laterality: N/A;  . TUBAL LIGATION  1985    Family History  Problem Relation Age of Onset  . Hyperlipidemia Mother   . Hypertension Mother   . Alzheimer's disease Mother   . Arthritis Father   . Hyperlipidemia Father   . Heart disease Father        first MI age 59  . Hypertension Father   .  Hyperlipidemia Brother   . Heart disease Brother        heart disease dx at a youg age  . Hypertension Brother   . Colon polyps Brother   . Alzheimer's disease Maternal Aunt   . Hyperlipidemia Maternal Uncle   . Heart disease Maternal Uncle   . Sudden death Maternal Uncle 42       massive heart attack in doctors office  . Arthritis Maternal Grandmother   . Hyperlipidemia Maternal Grandmother   . Stroke Maternal Grandmother   . Hypertension Maternal Grandmother   . Alzheimer's disease Maternal Grandmother   . Alcohol abuse Maternal Grandfather   . Hypertension Maternal Grandfather   .  Diabetes Maternal Grandfather   . Cancer Paternal Grandmother        Ovarian  . Hypertension Paternal Grandmother   . Alcohol abuse Paternal Grandfather   . Hyperlipidemia Paternal Grandfather   . Heart disease Paternal Grandfather   . Hypertension Paternal Grandfather   . Breast cancer Cousin        pat cousin  . Colon cancer Neg Hx   . Rectal cancer Neg Hx   . Stomach cancer Neg Hx     SOCIAL HX: reviewed.    Current Outpatient Medications:  .  acyclovir (ZOVIRAX) 400 MG tablet, TAKE 1 TABLET THREE TIMES A DAY FOR 5 DAYS AS NEEDED FOR FLARES, Disp: 90 tablet, Rfl: 0 .  buPROPion (WELLBUTRIN XL) 300 MG 24 hr tablet, Take 1 tablet by mouth once daily, Disp: 90 tablet, Rfl: 0 .  EPINEPHrine 0.3 mg/0.3 mL IJ SOAJ injection, Inject 0.3 mg into the muscle as needed (for anaphylaxis)., Disp: 3 each, Rfl: 0 .  lidocaine-prilocaine (EMLA) cream, Apply 1 application topically as directed. Apply 1 hour prior to port stick and cover with plastic wrap, Disp: 30 g, Rfl: 3 .  Multiple Vitamin (MULTIVITAMIN WITH MINERALS) TABS tablet, Take 1 tablet by mouth daily., Disp: , Rfl:  .  omeprazole (PRILOSEC) 20 MG capsule, TAKE 1 CAPSULE TWICE A DAY BEFORE MEALS, Disp: 180 capsule, Rfl: 1 .  ondansetron (ZOFRAN) 8 MG tablet, Take 1 tablet (8 mg total) by mouth every 8 (eight) hours as needed for nausea or vomiting., Disp: 30 tablet, Rfl: 2 .  rivaroxaban (XARELTO) 20 MG TABS tablet, Take 1 tablet (20 mg total) by mouth daily with supper., Disp: 30 tablet, Rfl: 11 .  rosuvastatin (CRESTOR) 5 MG tablet, Take 1 tablet by mouth once daily, Disp: 90 tablet, Rfl: 0 .  levothyroxine (SYNTHROID) 100 MCG tablet, Take 1 tablet (100 mcg total) by mouth daily before breakfast., Disp: 30 tablet, Rfl: 2  EXAM:  VITALS per patient if applicable: [phone]  GENERAL: alert. Sounds to be in no acute distress.  Answering questions appropriately.   PSYCH/NEURO: pleasant and cooperative, no obvious depression or  anxiety, speech and thought processing grossly intact  ASSESSMENT AND PLAN:  Discussed the following assessment and plan:  Problem List Items Addressed This Visit    Essential hypertension, benign    Blood pressure as outlined.  Appears to be doing well on current dose of lisinopril.  No changes made.  Follow pressures. Dr Benay Spice following metabolic panel.       GERD (gastroesophageal reflux disease)    Continue omeprazole.  Some acid reflux after infusion.  Follow.        History of colon polyps    Colonoscopy 03/30/19 - two small polyps.       Hypercholesterolemia    Continue  crestor.  Metabolic panel (including liver panel) being checked by oncology.  Follow lipid panel.       Hypothyroidism    Recent tsh elevated.  Discussed adjusting synthroid.  Will increase synthroid to 144mcg q day.  Will need follow/up tsh in 6-8 weeks.        Relevant Orders   TSH   Malignant neoplasm of fallopian tube ([facility])    Followed by oncology.  Receiving chemotherapy treatment as outlined.       Mild depression (HCC)    Continue wellbutrin.  Appears to be stable.       Ovarian cancer [facility])    Seeing Dr Benay Spice.  Undergoing chemotherapy as outlined.           I discussed the assessment and treatment plan with the patient. The patient was provided an opportunity to ask questions and all were answered. The patient agreed with the plan and demonstrated an understanding of the instructions.   The patient was advised to call back or seek an in-person evaluation if the symptoms worsen or if the condition fails to improve as anticipated.  I provided 22 minutes of non-face-to-face time during this encounter.   Einar Pheasant, MD

## 2021-03-22 NOTE — Progress Notes (Signed)
Pt complained that Benadryl was pushed fast and too close to her port and caused immediate burning sensation to her throat that lasted at least 24 hours.

## 2021-03-23 ENCOUNTER — Encounter: Payer: Self-pay | Admitting: Internal Medicine

## 2021-03-26 ENCOUNTER — Encounter: Payer: Self-pay | Admitting: Oncology

## 2021-03-26 MED ORDER — LEVOTHYROXINE SODIUM 100 MCG PO TABS: 100.0000 ug | ORAL_TABLET | Freq: Every day | ORAL | 2 refills | Status: DC

## 2021-03-26 NOTE — Telephone Encounter (Signed)
rx sent in for synthroid 122mcg

## 2021-04-01 ENCOUNTER — Other Ambulatory Visit: Payer: Self-pay | Admitting: Oncology

## 2021-04-01 ENCOUNTER — Encounter: Payer: Self-pay | Admitting: Internal Medicine

## 2021-04-01 DIAGNOSIS — E039 Hypothyroidism, unspecified: Secondary | ICD-10-CM

## 2021-04-01 NOTE — Assessment & Plan Note (Signed)
Followed by oncology.  Receiving chemotherapy treatment as outlined.

## 2021-04-01 NOTE — Assessment & Plan Note (Signed)
Blood pressure as outlined.  Appears to be doing well on current dose of lisinopril.  No changes made.  Follow pressures. Dr Benay Spice following metabolic panel.

## 2021-04-01 NOTE — Assessment & Plan Note (Signed)
Continue omeprazole.  Some acid reflux after infusion.  Follow.

## 2021-04-01 NOTE — Assessment & Plan Note (Signed)
Recent tsh elevated.  Discussed adjusting synthroid.  Will increase synthroid to 160mcg q day.  Will need follow/up tsh in 6-8 weeks.

## 2021-04-01 NOTE — Assessment & Plan Note (Signed)
Seeing Dr Benay Spice.  Undergoing chemotherapy as outlined.

## 2021-04-01 NOTE — Assessment & Plan Note (Signed)
Colonoscopy 03/30/19 - two small polyps.

## 2021-04-01 NOTE — Assessment & Plan Note (Signed)
Continue wellbutrin.  Appears to be stable.

## 2021-04-01 NOTE — Assessment & Plan Note (Signed)
Continue crestor.  Metabolic panel (including liver panel) being checked by oncology.  Follow lipid panel.

## 2021-04-03 DIAGNOSIS — H26493 Other secondary cataract, bilateral: Secondary | ICD-10-CM | POA: Diagnosis not present

## 2021-04-03 DIAGNOSIS — Z961 Presence of intraocular lens: Secondary | ICD-10-CM | POA: Diagnosis not present

## 2021-04-03 DIAGNOSIS — H43811 Vitreous degeneration, right eye: Secondary | ICD-10-CM | POA: Diagnosis not present

## 2021-04-03 DIAGNOSIS — Z83518 Family history of other specified eye disorder: Secondary | ICD-10-CM | POA: Diagnosis not present

## 2021-04-05 DIAGNOSIS — Z961 Presence of intraocular lens: Secondary | ICD-10-CM | POA: Diagnosis not present

## 2021-04-05 DIAGNOSIS — H3552 Pigmentary retinal dystrophy: Secondary | ICD-10-CM | POA: Diagnosis not present

## 2021-04-05 DIAGNOSIS — H353131 Nonexudative age-related macular degeneration, bilateral, early dry stage: Secondary | ICD-10-CM | POA: Diagnosis not present

## 2021-04-05 DIAGNOSIS — H04123 Dry eye syndrome of bilateral lacrimal glands: Secondary | ICD-10-CM | POA: Diagnosis not present

## 2021-04-08 ENCOUNTER — Other Ambulatory Visit: Payer: Self-pay | Admitting: Oncology

## 2021-04-10 ENCOUNTER — Inpatient Hospital Stay (HOSPITAL_BASED_OUTPATIENT_CLINIC_OR_DEPARTMENT_OTHER): Payer: BC Managed Care – PPO | Admitting: Oncology

## 2021-04-10 ENCOUNTER — Inpatient Hospital Stay: Payer: BC Managed Care – PPO

## 2021-04-10 ENCOUNTER — Telehealth: Payer: Self-pay

## 2021-04-10 ENCOUNTER — Other Ambulatory Visit: Payer: Self-pay

## 2021-04-10 VITALS — BP 127/69 | HR 88 | Temp 98.1°F | Resp 18 | Ht 63.0 in | Wt 183.0 lb

## 2021-04-10 DIAGNOSIS — C5702 Malignant neoplasm of left fallopian tube: Secondary | ICD-10-CM

## 2021-04-10 DIAGNOSIS — E039 Hypothyroidism, unspecified: Secondary | ICD-10-CM

## 2021-04-10 DIAGNOSIS — Z5111 Encounter for antineoplastic chemotherapy: Secondary | ICD-10-CM | POA: Diagnosis not present

## 2021-04-10 DIAGNOSIS — R971 Elevated cancer antigen 125 [CA 125]: Secondary | ICD-10-CM | POA: Diagnosis not present

## 2021-04-10 DIAGNOSIS — Z7901 Long term (current) use of anticoagulants: Secondary | ICD-10-CM | POA: Diagnosis not present

## 2021-04-10 DIAGNOSIS — Z86711 Personal history of pulmonary embolism: Secondary | ICD-10-CM | POA: Diagnosis not present

## 2021-04-10 DIAGNOSIS — Z808 Family history of malignant neoplasm of other organs or systems: Secondary | ICD-10-CM | POA: Diagnosis not present

## 2021-04-10 DIAGNOSIS — M549 Dorsalgia, unspecified: Secondary | ICD-10-CM | POA: Diagnosis not present

## 2021-04-10 DIAGNOSIS — G629 Polyneuropathy, unspecified: Secondary | ICD-10-CM | POA: Diagnosis not present

## 2021-04-10 DIAGNOSIS — F329 Major depressive disorder, single episode, unspecified: Secondary | ICD-10-CM | POA: Diagnosis not present

## 2021-04-10 LAB — CBC WITH DIFFERENTIAL (CANCER CENTER ONLY)
Abs Immature Granulocytes: 0.03 10*3/uL (ref 0.00–0.07)
Basophils Absolute: 0 10*3/uL (ref 0.0–0.1)
Basophils Relative: 0 %
Eosinophils Absolute: 0 10*3/uL (ref 0.0–0.5)
Eosinophils Relative: 1 %
HCT: 26.1 % — ABNORMAL LOW (ref 36.0–46.0)
Hemoglobin: 8.3 g/dL — ABNORMAL LOW (ref 12.0–15.0)
Immature Granulocytes: 1 %
Lymphocytes Relative: 40 %
Lymphs Abs: 2 10*3/uL (ref 0.7–4.0)
MCH: 36.6 pg — ABNORMAL HIGH (ref 26.0–34.0)
MCHC: 31.8 g/dL (ref 30.0–36.0)
MCV: 115 fL — ABNORMAL HIGH (ref 80.0–100.0)
Monocytes Absolute: 0.7 10*3/uL (ref 0.1–1.0)
Monocytes Relative: 15 %
Neutro Abs: 2.2 10*3/uL (ref 1.7–7.7)
Neutrophils Relative %: 43 %
Platelet Count: 130 10*3/uL — ABNORMAL LOW (ref 150–400)
RBC: 2.27 MIL/uL — ABNORMAL LOW (ref 3.87–5.11)
RDW: 15.5 % (ref 11.5–15.5)
WBC Count: 5 10*3/uL (ref 4.0–10.5)
nRBC: 0 % (ref 0.0–0.2)

## 2021-04-10 LAB — CMP (CANCER CENTER ONLY)
ALT: 16 U/L (ref 0–44)
AST: 16 U/L (ref 15–41)
Albumin: 3.7 g/dL (ref 3.5–5.0)
Alkaline Phosphatase: 69 U/L (ref 38–126)
Anion gap: 9 (ref 5–15)
BUN: 18 mg/dL (ref 8–23)
CO2: 24 mmol/L (ref 22–32)
Calcium: 8.3 mg/dL — ABNORMAL LOW (ref 8.9–10.3)
Chloride: 107 mmol/L (ref 98–111)
Creatinine: 0.81 mg/dL (ref 0.44–1.00)
GFR, Estimated: >60 mL/min (ref >60)
Glucose, Bld: 115 mg/dL — ABNORMAL HIGH (ref 70–99)
Potassium: 3.2 mmol/L — ABNORMAL LOW (ref 3.5–5.1)
Sodium: 140 mmol/L (ref 135–145)
Total Bilirubin: 0.2 mg/dL — ABNORMAL LOW (ref 0.3–1.2)
Total Protein: 6.8 g/dL (ref 6.5–8.1)

## 2021-04-10 LAB — TSH: TSH: 11.189 u[IU]/mL — ABNORMAL HIGH (ref 0.350–4.500)

## 2021-04-10 MED ORDER — DIPHENHYDRAMINE HCL 50 MG/ML IJ SOLN
50.0000 mg | Freq: Once | INTRAMUSCULAR | Status: AC
  Administered 2021-04-10: 50 mg via INTRAVENOUS
  Filled 2021-04-10: qty 1

## 2021-04-10 MED ORDER — POTASSIUM CHLORIDE CRYS ER 20 MEQ PO TBCR: 20.0000 meq | EXTENDED_RELEASE_TABLET | Freq: Every day | ORAL | 0 refills | Status: DC

## 2021-04-10 MED ORDER — SODIUM CHLORIDE 0.9 % IV SOLN
140.0000 mg/m2 | Freq: Once | INTRAVENOUS | Status: AC
  Administered 2021-04-10: 258 mg via INTRAVENOUS
  Filled 2021-04-10: qty 43

## 2021-04-10 MED ORDER — FAMOTIDINE 20 MG IN NS 100 ML IVPB
20.0000 mg | Freq: Once | INTRAVENOUS | Status: AC
  Administered 2021-04-10: 20 mg via INTRAVENOUS
  Filled 2021-04-10: qty 100

## 2021-04-10 MED ORDER — SODIUM CHLORIDE 0.9% FLUSH
10.0000 mL | INTRAVENOUS | Status: DC | PRN
  Administered 2021-04-10: 10 mL
  Filled 2021-04-10: qty 10

## 2021-04-10 MED ORDER — SODIUM CHLORIDE 0.9 % IV SOLN
10.0000 mg | Freq: Once | INTRAVENOUS | Status: AC
  Administered 2021-04-10: 10 mg via INTRAVENOUS
  Filled 2021-04-10: qty 1

## 2021-04-10 MED ORDER — SODIUM CHLORIDE 0.9 % IV SOLN
150.0000 mg | Freq: Once | INTRAVENOUS | Status: AC
  Administered 2021-04-10: 150 mg via INTRAVENOUS
  Filled 2021-04-10: qty 5

## 2021-04-10 MED ORDER — HEPARIN SOD (PORK) LOCK FLUSH 100 UNIT/ML IV SOLN
500.0000 [IU] | Freq: Once | INTRAVENOUS | Status: AC | PRN
  Administered 2021-04-10: 500 [IU]
  Filled 2021-04-10: qty 5

## 2021-04-10 MED ORDER — PALONOSETRON HCL INJECTION 0.25 MG/5ML
0.2500 mg | Freq: Once | INTRAVENOUS | Status: AC
  Administered 2021-04-10: 0.25 mg via INTRAVENOUS
  Filled 2021-04-10: qty 5

## 2021-04-10 MED ORDER — SODIUM CHLORIDE 0.9 % IV SOLN
Freq: Once | INTRAVENOUS | Status: AC
  Filled 2021-04-10: qty 250

## 2021-04-10 MED ORDER — SODIUM CHLORIDE 0.9 % IV SOLN
340.0000 mg | Freq: Once | INTRAVENOUS | Status: AC
  Administered 2021-04-10: 340 mg via INTRAVENOUS
  Filled 2021-04-10: qty 34

## 2021-04-10 NOTE — Telephone Encounter (Signed)
TC to Pt to inform her that her lab work showed her potassium was low. Asked Pt if she was having diarrhea she stated she was not having diarrhea. Informed Pt that a prescription will be sent to her pharmacy for potassium. And also informed her she could also eat potassium enriched foods like bananas and beans Pt verbalized understanding. No further problems or concerns noted.

## 2021-04-10 NOTE — Progress Notes (Signed)
[facility] OFFICE PROGRESS NOTE   Diagnosis: Fallopian tube carcinoma  INTERVAL HISTORY:   [name] returns as scheduled.  She completed another treatment with Taxol/carboplatin on 03/20/2021.  She had mild peripheral numbness for a few days following chemotherapy.  This has resolved.  She feels well.  No pain.  The thyroid hormone dose was increased by Dr. Nicki Reaper. She reports burning at the upper chest and throat for approximately 5 minutes after receiving intravenous Benadryl on 03/20/2021. Objective:  Vital signs in last 24 hours:  Blood pressure 127/69, pulse 88, temperature 98.1 F (36.7 C), temperature source Oral, resp. rate 18, height 5' 3" (1.6 m), weight 183 lb (83 kg), SpO2 99 %.    HEENT: No thrush or ulcers Resp: Lungs clear bilaterally Cardio: Regular rate and rhythm GI: Nontender, no mass, no hepatosplenomegaly, no apparent ascites Vascular: No leg edema    Portacath/PICC-without erythema  Lab Results:  Lab Results  Component Value Date   WBC 5.0 04/10/2021   HGB 8.3 (L) 04/10/2021   HCT 26.1 (L) 04/10/2021   MCV 115.0 (H) 04/10/2021   PLT 130 (L) 04/10/2021   NEUTROABS 2.2 04/10/2021    CMP  Lab Results  Component Value Date   NA 139 03/20/2021   K 3.6 03/20/2021   CL 103 03/20/2021   CO2 26 03/20/2021   GLUCOSE 104 (H) 03/20/2021   BUN 15 03/20/2021   CREATININE 1.01 (H) 03/20/2021   CALCIUM 9.4 03/20/2021   PROT 6.8 03/20/2021   ALBUMIN 3.9 03/20/2021   AST 20 03/20/2021   ALT 20 03/20/2021   ALKPHOS 81 03/20/2021   BILITOT 0.3 03/20/2021   GFRNONAA >60 03/20/2021   GFRAA >60 08/10/2020     Medications: I have reviewed the patient's current medications.   Assessment/Plan: 1. left abdomen/pelvic pain ? CT abdomen/pelvis 06/26/2017-soft tissue implants in the lower anterior peritoneum with implants at the paracolic gutters and left pelvic sidewall ? NegativeMYRIADhereditary cancer panel ? Elevated CA 125 ? CT  biopsy of anterior omental mass 07/04/2017-Metastatic carcinoma consistent with a gynecologic primary ? 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 Cycle 1 adjuvant Taxol/carboplatin 08/05/2017 ? Cycle 2 adjuvant Taxol/carboplatin 08/26/2017 ? Cycle 3 adjuvant Taxol/carboplatin 09/17/2017-Taxol dose reduced secondary to neuropathy and bone pain ? Cycle 4 adjuvant Taxol/carboplatin 10/10/2017 ? Cycle 5 adjuvant Taxol/carboplatin 10/31/2017 ? Cycle 6 adjuvant Taxol/carboplatin 11/21/2017 ? CT abdomen/pelvis 08/08/2019-narrowing of rectosigmoid colon with eccentric serosal implant, 10 mm lymph node at the superior rectal vein, left upper quadrant soft tissue implant, 9 mm precaval node, 15 mm left internal iliac node, no ascites ? Cycle 1 Taxol/carboplatin 08/31/2019 ? Cycle 2 Taxol/carboplatin/Avastin 09/21/2019 ? Cycle 3 Taxol/Carboplatin 10/12/2019 (Avastin discontinued) ? Cycle 4 Taxol/carboplatin November 02, 2019 ? CT abdomen/pelvis 11/17/2019-decrease in size of previously prominent pericaval, left internal iliac, and superior rectal vein nodes, resolved rectosigmoid wall thickening ? Cycle 5 Taxol/carboplatin 11/22/2019 ? Cycle 6 Taxol/carboplatin 12/13/2019 ? Olaparib2/17/2021 ? CT abdomen/pelvis 07/10/2020-enlarging soft tissue at the distal left ureter, the ureter is not dilated, peritoneal implant adjacent to the splenic flexure, enlarged left superior rectal node, lymph nodes along the course of the distal IMV have enlarged, eccentric thickening of the sigmoid colon not seen  ?  Olaraparib discontinued 07/12/2020 ? Cycle 1  Taxol/carboplatin 07/21/2020 ? Cycle 2 Taxol/carboplatin 08/10/2020 (carboplatin dose reduced secondary to thrombocytopenia) ? Cycle 3 Taxol/carboplatin 09/05/2020 ? Cycle 4 Taxol/carboplatin 09/26/2020 ? Cycle 5 Taxol/carboplatin 10/24/2020 ? CT abdomen/pelvis 11/13/2020-overall stable disease, no new site of metastatic disease, generally stable small abdominal/pelvic lymph nodes, slight enlargement of a peritoneal implant in the left pelvis, slight decrease in size of another pelvic implant ? Cycle 6 Taxol/carboplatin 11/14/2020 ? Cycle 7 Taxol/carboplatin 12/05/2020 ? Cycle 8 Taxol/carboplatin 12/26/2020 ? Cycle 9 Taxol/carboplatin 01/16/2021 ? Cycle 10 Taxol/carboplatin 02/06/2021 ? Cycle 11 Taxol/carboplatin 02/27/2021 ? Cycle 12 Taxol/carboplatin 03/20/2021 ? Cycle 13 Taxol/carboplatin 04/10/2021  2. Depression  3. Hypothyroid  4. Family history of uterine cancer-paternal grandmother  25.Acute onset dyspnea/pleuritic left-sided chest pain 10/02/2019  CT chest 10/04/2019-left lower lobe pulmonary emboli, multifocal pneumonia lower lobes  On Xarelto, status post course of Levaquin.  6.  Acute onset left lower back pain 01/24/2021-resolved after a Medrol Dosepak  Disposition: [name] appears stable.  She continues to tolerate the chemotherapy well.  She will complete another cycle of Taxol/carboplatin with G-CSF support beginning today.  We will follow-up on the CA125 from today.  She will return for an office visit and chemotherapy on 05/08/2021.  [name], MD  04/10/2021  9:05 AM

## 2021-04-10 NOTE — Patient Instructions (Addendum)
[place]   Discharge Instructions: Thank you for choosing [facility] to provide your oncology and hematology care.   If you have a lab appointment with the [facility], please go directly to the [facility] and check in at the registration area.   Wear comfortable clothing and clothing appropriate for easy access to any Portacath or PICC line.   We strive to give you quality time with your provider. You may need to reschedule your appointment if you arrive late (15 or more minutes).  Arriving late affects you and other patients whose appointments are after yours.  Also, if you miss three or more appointments without notifying the office, you may be dismissed from the clinic at the provider's discretion.      For prescription refill requests, have your pharmacy contact our office and allow 72 hours for refills to be completed.    Today you received the following chemotherapy and/or immunotherapy agents paraplatin, paclitaxel   To help prevent nausea and vomiting after your treatment, we encourage you to take your nausea medication as directed.  BELOW ARE SYMPTOMS THAT SHOULD BE REPORTED IMMEDIATELY: . *FEVER GREATER THAN 100.4 F (38 C) OR HIGHER . *CHILLS OR SWEATING . *NAUSEA AND VOMITING THAT IS NOT CONTROLLED WITH YOUR NAUSEA MEDICATION . *UNUSUAL SHORTNESS OF BREATH . *UNUSUAL BRUISING OR BLEEDING . *URINARY PROBLEMS (pain or burning when urinating, or frequent urination) . *BOWEL PROBLEMS (unusual diarrhea, constipation, pain near the anus) . TENDERNESS IN MOUTH AND THROAT WITH OR WITHOUT PRESENCE OF ULCERS (sore throat, sores in mouth, or a toothache) . UNUSUAL RASH, SWELLING OR PAIN  . UNUSUAL VAGINAL DISCHARGE OR ITCHING   Items with * indicate a potential emergency and should be followed up as soon as possible or go to the Emergency Department if any problems should occur.  Please show the CHEMOTHERAPY ALERT CARD or  IMMUNOTHERAPY ALERT CARD at check-in to the Emergency Department and triage nurse.  Should you have questions after your visit or need to cancel or reschedule your appointment, please contact [facility]  Dept: [phone]  and follow the prompts.  Office hours are 8:00 a.m. to 4:30 p.m. Monday - Friday. Please note that voicemails left after 4:00 p.m. may not be returned until the following business day.  We are closed weekends and major holidays. You have access to a nurse at all times for urgent questions. Please call the main number to the clinic Dept: [phone] and follow the prompts.   For any non-urgent questions, you may also contact your provider using MyChart. We now offer e-Visits for anyone 64 and older to request care online for non-urgent symptoms. For details visit [email or web address].   Also download the MyChart app! Go to the app store, search "MyChart", open the app, select [facility], and log in with your MyChart username and password.  Due to Covid, a mask is required upon entering the hospital/clinic. If you do not have a mask, one will be given to you upon arrival. For doctor visits, patients may have 1 support person aged 80 or older with them. For treatment visits, patients cannot have anyone with them due to current Covid guidelines and our immunocompromised population.   Carboplatin injection What is this medicine? CARBOPLATIN (KAR boe pla tin) is a chemotherapy drug. It targets fast dividing cells, like cancer cells, and causes these cells to die. This medicine is used to treat ovarian cancer and many other  cancers. This medicine may be used for other purposes; ask your health care provider or pharmacist if you have questions. COMMON BRAND NAME(S): Paraplatin What should I tell my health care provider before I take this medicine? They need to know if you have any of these conditions:  blood disorders  hearing problems  kidney  disease  recent or ongoing radiation therapy  an unusual or allergic reaction to carboplatin, cisplatin, other chemotherapy, other medicines, foods, dyes, or preservatives  pregnant or trying to get pregnant  breast-feeding How should I use this medicine? This drug is usually given as an infusion into a vein. It is administered in a hospital or clinic by a specially trained health care professional. Talk to your pediatrician regarding the use of this medicine in children. Special care may be needed. Overdosage: If you think you have taken too much of this medicine contact a poison control center or emergency room at once. NOTE: This medicine is only for you. Do not share this medicine with others. What if I miss a dose? It is important not to miss a dose. Call your doctor or health care professional if you are unable to keep an appointment. What may interact with this medicine?  medicines for seizures  medicines to increase blood counts like filgrastim, pegfilgrastim, sargramostim  some antibiotics like amikacin, gentamicin, neomycin, streptomycin, tobramycin  vaccines Talk to your doctor or health care professional before taking any of these medicines:  acetaminophen  aspirin  ibuprofen  ketoprofen  naproxen This list may not describe all possible interactions. Give your health care provider a list of all the medicines, herbs, non-prescription drugs, or dietary supplements you use. Also tell them if you smoke, drink alcohol, or use illegal drugs. Some items may interact with your medicine. What should I watch for while using this medicine? Your condition will be monitored carefully while you are receiving this medicine. You will need important blood work done while you are taking this medicine. This drug may make you feel generally unwell. This is not uncommon, as chemotherapy can affect healthy cells as well as cancer cells. Report any side effects. Continue your course of  treatment even though you feel ill unless your doctor tells you to stop. In some cases, you may be given additional medicines to help with side effects. Follow all directions for their use. Call your doctor or health care professional for advice if you get a fever, chills or sore throat, or other symptoms of a cold or flu. Do not treat yourself. This drug decreases your body's ability to fight infections. Try to avoid being around people who are sick. This medicine may increase your risk to bruise or bleed. Call your doctor or health care professional if you notice any unusual bleeding. Be careful brushing and flossing your teeth or using a toothpick because you may get an infection or bleed more easily. If you have any dental work done, tell your dentist you are receiving this medicine. Avoid taking products that contain aspirin, acetaminophen, ibuprofen, naproxen, or ketoprofen unless instructed by your doctor. These medicines may hide a fever. Do not become pregnant while taking this medicine. Women should inform their doctor if they wish to become pregnant or think they might be pregnant. There is a potential for serious side effects to an unborn child. Talk to your health care professional or pharmacist for more information. Do not breast-feed an infant while taking this medicine. What side effects may I notice from receiving this medicine?   Side effects that you should report to your doctor or health care professional as soon as possible:  allergic reactions like skin rash, itching or hives, swelling of the face, lips, or tongue  signs of infection - fever or chills, cough, sore throat, pain or difficulty passing urine  signs of decreased platelets or bleeding - bruising, pinpoint red spots on the skin, black, tarry stools, nosebleeds  signs of decreased red blood cells - unusually weak or tired, fainting spells, lightheadedness  breathing problems  changes in hearing  changes in  vision  chest pain  high blood pressure  low blood counts - This drug may decrease the number of white blood cells, red blood cells and platelets. You may be at increased risk for infections and bleeding.  nausea and vomiting  pain, swelling, redness or irritation at the injection site  pain, tingling, numbness in the hands or feet  problems with balance, talking, walking  trouble passing urine or change in the amount of urine Side effects that usually do not require medical attention (report to your doctor or health care professional if they continue or are bothersome):  hair loss  loss of appetite  metallic taste in the mouth or changes in taste This list may not describe all possible side effects. Call your doctor for medical advice about side effects. You may report side effects to FDA at 1-800-FDA-1088. Where should I keep my medicine? This drug is given in a hospital or clinic and will not be stored at home. NOTE: This sheet is a summary. It may not cover all possible information. If you have questions about this medicine, talk to your doctor, pharmacist, or health care provider.  2021 Elsevier/Gold Standard (2008-02-09 14:38:05)  Nanoparticle Albumin-Bound Paclitaxel injection What is this medicine? NANOPARTICLE ALBUMIN-BOUND PACLITAXEL (Na no PAHR ti kuhl al BYOO muhn-bound PAK li TAX el) is a chemotherapy drug. It targets fast dividing cells, like cancer cells, and causes these cells to die. This medicine is used to treat advanced breast cancer, lung cancer, and pancreatic cancer. This medicine may be used for other purposes; ask your health care provider or pharmacist if you have questions. COMMON BRAND NAME(S): Abraxane What should I tell my health care provider before I take this medicine? They need to know if you have any of these conditions:  kidney disease  liver disease  low blood counts, like low white cell, platelet, or red cell counts  lung or breathing  disease, like asthma  tingling of the fingers or toes, or other nerve disorder  an unusual or allergic reaction to paclitaxel, albumin, other chemotherapy, other medicines, foods, dyes, or preservatives  pregnant or trying to get pregnant  breast-feeding How should I use this medicine? This drug is given as an infusion into a vein. It is administered in a hospital or clinic by a specially trained health care professional. Talk to your pediatrician regarding the use of this medicine in children. Special care may be needed. Overdosage: If you think you have taken too much of this medicine contact a poison control center or emergency room at once. NOTE: This medicine is only for you. Do not share this medicine with others. What if I miss a dose? It is important not to miss your dose. Call your doctor or health care professional if you are unable to keep an appointment. What may interact with this medicine? This medicine may interact with the following medications:  antiviral medicines for hepatitis, HIV or AIDS  certain  antibiotics like erythromycin and clarithromycin  certain medicines for fungal infections like ketoconazole and itraconazole  certain medicines for seizures like carbamazepine, phenobarbital, phenytoin  gemfibrozil  nefazodone  rifampin  St. John's wort This list may not describe all possible interactions. Give your health care provider a list of all the medicines, herbs, non-prescription drugs, or dietary supplements you use. Also tell them if you smoke, drink alcohol, or use illegal drugs. Some items may interact with your medicine. What should I watch for while using this medicine? Your condition will be monitored carefully while you are receiving this medicine. You will need important blood work done while you are taking this medicine. This medicine can cause serious allergic reactions. If you experience allergic reactions like skin rash, itching or hives,  swelling of the face, lips, or tongue, tell your doctor or health care professional right away. In some cases, you may be given additional medicines to help with side effects. Follow all directions for their use. This drug may make you feel generally unwell. This is not uncommon, as chemotherapy can affect healthy cells as well as cancer cells. Report any side effects. Continue your course of treatment even though you feel ill unless your doctor tells you to stop. Call your doctor or health care professional for advice if you get a fever, chills or sore throat, or other symptoms of a cold or flu. Do not treat yourself. This drug decreases your body's ability to fight infections. Try to avoid being around people who are sick. This medicine may increase your risk to bruise or bleed. Call your doctor or health care professional if you notice any unusual bleeding. Be careful brushing and flossing your teeth or using a toothpick because you may get an infection or bleed more easily. If you have any dental work done, tell your dentist you are receiving this medicine. Avoid taking products that contain aspirin, acetaminophen, ibuprofen, naproxen, or ketoprofen unless instructed by your doctor. These medicines may hide a fever. Do not become pregnant while taking this medicine or for 6 months after stopping it. Women should inform their doctor if they wish to become pregnant or think they might be pregnant. Men should not father a child while taking this medicine or for 3 months after stopping it. There is a potential for serious side effects to an unborn child. Talk to your health care professional or pharmacist for more information. Do not breast-feed an infant while taking this medicine or for 2 weeks after stopping it. This medicine may interfere with the ability to get pregnant or to father a child. You should talk to your doctor or health care professional if you are concerned about your fertility. What side  effects may I notice from receiving this medicine? Side effects that you should report to your doctor or health care professional as soon as possible:  allergic reactions like skin rash, itching or hives, swelling of the face, lips, or tongue  breathing problems  changes in vision  fast, irregular heartbeat  low blood pressure  mouth sores  pain, tingling, numbness in the hands or feet  signs of decreased platelets or bleeding - bruising, pinpoint red spots on the skin, black, tarry stools, blood in the urine  signs of decreased red blood cells - unusually weak or tired, feeling faint or lightheaded, falls  signs of infection - fever or chills, cough, sore throat, pain or difficulty passing urine  signs and symptoms of liver injury like dark yellow or brown  urine; general ill feeling or flu-like symptoms; light-colored stools; loss of appetite; nausea; right upper belly pain; unusually weak or tired; yellowing of the eyes or skin  swelling of the ankles, feet, hands  unusually slow heartbeat Side effects that usually do not require medical attention (report to your doctor or health care professional if they continue or are bothersome):  diarrhea  hair loss  loss of appetite  nausea, vomiting  tiredness This list may not describe all possible side effects. Call your doctor for medical advice about side effects. You may report side effects to FDA at 1-800-FDA-1088. Where should I keep my medicine? This drug is given in a hospital or clinic and will not be stored at home. NOTE: This sheet is a summary. It may not cover all possible information. If you have questions about this medicine, talk to your doctor, pharmacist, or health care provider.  2021 Elsevier/Gold Standard (2017-07-08 13:03:45)

## 2021-04-10 NOTE — Telephone Encounter (Signed)
-----   Message from Ladell Pier, MD sent at 04/10/2021  1:31 PM EDT ----- Please call patient, the potassium is mildly low, is she having diarrhea?,  Start potassium chloride 20 mEq daily, follow-up as scheduled

## 2021-04-11 ENCOUNTER — Encounter: Payer: Self-pay | Admitting: *Deleted

## 2021-04-11 LAB — CA 125: Cancer Antigen (CA) 125: 31.8 U/mL (ref 0.0–38.1)

## 2021-04-11 NOTE — Progress Notes (Signed)
TSH results routed to DR. Einar Pheasant, PCP as requested.

## 2021-04-12 ENCOUNTER — Telehealth: Payer: Self-pay | Admitting: Internal Medicine

## 2021-04-12 ENCOUNTER — Inpatient Hospital Stay: Payer: BC Managed Care – PPO

## 2021-04-12 ENCOUNTER — Other Ambulatory Visit: Payer: Self-pay

## 2021-04-12 VITALS — BP 127/78 | HR 86 | Temp 97.9°F | Resp 18

## 2021-04-12 DIAGNOSIS — Z808 Family history of malignant neoplasm of other organs or systems: Secondary | ICD-10-CM | POA: Diagnosis not present

## 2021-04-12 DIAGNOSIS — Z5111 Encounter for antineoplastic chemotherapy: Secondary | ICD-10-CM | POA: Diagnosis not present

## 2021-04-12 DIAGNOSIS — M549 Dorsalgia, unspecified: Secondary | ICD-10-CM | POA: Diagnosis not present

## 2021-04-12 DIAGNOSIS — Z86711 Personal history of pulmonary embolism: Secondary | ICD-10-CM | POA: Diagnosis not present

## 2021-04-12 DIAGNOSIS — C5702 Malignant neoplasm of left fallopian tube: Secondary | ICD-10-CM | POA: Diagnosis not present

## 2021-04-12 DIAGNOSIS — G629 Polyneuropathy, unspecified: Secondary | ICD-10-CM | POA: Diagnosis not present

## 2021-04-12 DIAGNOSIS — F329 Major depressive disorder, single episode, unspecified: Secondary | ICD-10-CM | POA: Diagnosis not present

## 2021-04-12 DIAGNOSIS — E039 Hypothyroidism, unspecified: Secondary | ICD-10-CM | POA: Diagnosis not present

## 2021-04-12 DIAGNOSIS — R971 Elevated cancer antigen 125 [CA 125]: Secondary | ICD-10-CM | POA: Diagnosis not present

## 2021-04-12 DIAGNOSIS — Z7901 Long term (current) use of anticoagulants: Secondary | ICD-10-CM | POA: Diagnosis not present

## 2021-04-12 MED ORDER — PEGFILGRASTIM-CBQV 6 MG/0.6ML ~~LOC~~ SOSY
6.0000 mg | PREFILLED_SYRINGE | Freq: Once | SUBCUTANEOUS | Status: AC
  Administered 2021-04-12: 6 mg via SUBCUTANEOUS

## 2021-04-12 NOTE — Telephone Encounter (Signed)
LMTCB

## 2021-04-12 NOTE — Telephone Encounter (Signed)
Pt called back returning your call °

## 2021-04-12 NOTE — Telephone Encounter (Signed)
Patient aware and will have rechecked on 6/21 at next oncology appt

## 2021-04-12 NOTE — Telephone Encounter (Signed)
-----   Message from Tania Ade, RN sent at 04/11/2021  9:49 AM EDT ----- See requested TSH results

## 2021-04-12 NOTE — Patient Instructions (Signed)
Pegfilgrastim injection What is this medicine? PEGFILGRASTIM (PEG fil gra stim) is a long-acting granulocyte colony-stimulating factor that stimulates the growth of neutrophils, a type of white blood cell important in the body's fight against infection. It is used to reduce the incidence of fever and infection in patients with certain types of cancer who are receiving chemotherapy that affects the bone marrow, and to increase survival after being exposed to high doses of radiation. This medicine may be used for other purposes; ask your health care provider or pharmacist if you have questions. COMMON BRAND NAME(S): Fulphila, Neulasta, Nyvepria, UDENYCA, Ziextenzo What should I tell my health care provider before I take this medicine? They need to know if you have any of these conditions:  kidney disease  latex allergy  ongoing radiation therapy  sickle cell disease  skin reactions to acrylic adhesives (On-Body Injector only)  an unusual or allergic reaction to pegfilgrastim, filgrastim, other medicines, foods, dyes, or preservatives  pregnant or trying to get pregnant  breast-feeding How should I use this medicine? This medicine is for injection under the skin. If you get this medicine at home, you will be taught how to prepare and give the pre-filled syringe or how to use the On-body Injector. Refer to the patient Instructions for Use for detailed instructions. Use exactly as directed. Tell your healthcare provider immediately if you suspect that the On-body Injector may not have performed as intended or if you suspect the use of the On-body Injector resulted in a missed or partial dose. It is important that you put your used needles and syringes in a special sharps container. Do not put them in a trash can. If you do not have a sharps container, call your pharmacist or healthcare provider to get one. Talk to your pediatrician regarding the use of this medicine in children. While this drug  may be prescribed for selected conditions, precautions do apply. Overdosage: If you think you have taken too much of this medicine contact a poison control center or emergency room at once. NOTE: This medicine is only for you. Do not share this medicine with others. What if I miss a dose? It is important not to miss your dose. Call your doctor or health care professional if you miss your dose. If you miss a dose due to an On-body Injector failure or leakage, a new dose should be administered as soon as possible using a single prefilled syringe for manual use. What may interact with this medicine? Interactions have not been studied. This list may not describe all possible interactions. Give your health care provider a list of all the medicines, herbs, non-prescription drugs, or dietary supplements you use. Also tell them if you smoke, drink alcohol, or use illegal drugs. Some items may interact with your medicine. What should I watch for while using this medicine? Your condition will be monitored carefully while you are receiving this medicine. You may need blood work done while you are taking this medicine. Talk to your health care provider about your risk of cancer. You may be more at risk for certain types of cancer if you take this medicine. If you are going to need a MRI, CT scan, or other procedure, tell your doctor that you are using this medicine (On-Body Injector only). What side effects may I notice from receiving this medicine? Side effects that you should report to your doctor or health care professional as soon as possible:  allergic reactions (skin rash, itching or hives, swelling of   the face, lips, or tongue)  back pain  dizziness  fever  pain, redness, or irritation at site where injected  pinpoint red spots on the skin  red or dark-brown urine  shortness of breath or breathing problems  stomach or side pain, or pain at the shoulder  swelling  tiredness  trouble  passing urine or change in the amount of urine  unusual bruising or bleeding Side effects that usually do not require medical attention (report to your doctor or health care professional if they continue or are bothersome):  bone pain  muscle pain This list may not describe all possible side effects. Call your doctor for medical advice about side effects. You may report side effects to FDA at 1-800-FDA-1088. Where should I keep my medicine? Keep out of the reach of children. If you are using this medicine at home, you will be instructed on how to store it. Throw away any unused medicine after the expiration date on the label. NOTE: This sheet is a summary. It may not cover all possible information. If you have questions about this medicine, talk to your doctor, pharmacist, or health care provider.  2021 Elsevier/Gold Standard (2019-11-26 13:20:51)  

## 2021-04-12 NOTE — Telephone Encounter (Signed)
Received tsh result from oncology.  Pt had requested for lab to be drawn.  I just increased her synthroid to 144mcg a couple of weeks ago.  Please inform Anna [name] that we usually wait at least 6 weeks to recheck the tsh to get the full effect of the medication. Given that she has been only been on this dose for a couple of weeks, would like to continue the current dose for now and recheck in about 4 weeks.  Will readjust dose then depending on response to the 178mcg change.

## 2021-05-01 ENCOUNTER — Other Ambulatory Visit: Payer: BC Managed Care – PPO

## 2021-05-01 ENCOUNTER — Ambulatory Visit: Payer: BC Managed Care – PPO

## 2021-05-01 ENCOUNTER — Ambulatory Visit: Payer: BC Managed Care – PPO | Admitting: Oncology

## 2021-05-03 ENCOUNTER — Ambulatory Visit: Payer: BC Managed Care – PPO

## 2021-05-06 ENCOUNTER — Other Ambulatory Visit: Payer: Self-pay | Admitting: Oncology

## 2021-05-08 ENCOUNTER — Inpatient Hospital Stay: Payer: BC Managed Care – PPO

## 2021-05-08 ENCOUNTER — Inpatient Hospital Stay: Payer: BC Managed Care – PPO | Attending: Oncology

## 2021-05-08 ENCOUNTER — Other Ambulatory Visit: Payer: Self-pay

## 2021-05-08 ENCOUNTER — Inpatient Hospital Stay (HOSPITAL_BASED_OUTPATIENT_CLINIC_OR_DEPARTMENT_OTHER): Payer: BC Managed Care – PPO | Admitting: Oncology

## 2021-05-08 VITALS — BP 142/92 | HR 106 | Temp 97.7°F | Resp 20 | Ht 63.0 in | Wt 178.6 lb

## 2021-05-08 DIAGNOSIS — E039 Hypothyroidism, unspecified: Secondary | ICD-10-CM | POA: Diagnosis not present

## 2021-05-08 DIAGNOSIS — C5702 Malignant neoplasm of left fallopian tube: Secondary | ICD-10-CM

## 2021-05-08 DIAGNOSIS — F32A Depression, unspecified: Secondary | ICD-10-CM | POA: Diagnosis not present

## 2021-05-08 DIAGNOSIS — Z86711 Personal history of pulmonary embolism: Secondary | ICD-10-CM | POA: Insufficient documentation

## 2021-05-08 DIAGNOSIS — Z5111 Encounter for antineoplastic chemotherapy: Secondary | ICD-10-CM | POA: Insufficient documentation

## 2021-05-08 DIAGNOSIS — Z808 Family history of malignant neoplasm of other organs or systems: Secondary | ICD-10-CM | POA: Insufficient documentation

## 2021-05-08 DIAGNOSIS — C786 Secondary malignant neoplasm of retroperitoneum and peritoneum: Secondary | ICD-10-CM | POA: Insufficient documentation

## 2021-05-08 DIAGNOSIS — Z7901 Long term (current) use of anticoagulants: Secondary | ICD-10-CM | POA: Insufficient documentation

## 2021-05-08 LAB — CBC WITH DIFFERENTIAL (CANCER CENTER ONLY)
Abs Immature Granulocytes: 0.02 10*3/uL (ref 0.00–0.07)
Basophils Absolute: 0 10*3/uL (ref 0.0–0.1)
Basophils Relative: 0 %
Eosinophils Absolute: 0.1 10*3/uL (ref 0.0–0.5)
Eosinophils Relative: 1 %
HCT: 26.4 % — ABNORMAL LOW (ref 36.0–46.0)
Hemoglobin: 8.5 g/dL — ABNORMAL LOW (ref 12.0–15.0)
Immature Granulocytes: 0 %
Lymphocytes Relative: 29 %
Lymphs Abs: 1.6 10*3/uL (ref 0.7–4.0)
MCH: 36.5 pg — ABNORMAL HIGH (ref 26.0–34.0)
MCHC: 32.2 g/dL (ref 30.0–36.0)
MCV: 113.3 fL — ABNORMAL HIGH (ref 80.0–100.0)
Monocytes Absolute: 0.7 10*3/uL (ref 0.1–1.0)
Monocytes Relative: 13 %
Neutro Abs: 3 10*3/uL (ref 1.7–7.7)
Neutrophils Relative %: 57 %
Platelet Count: 144 10*3/uL — ABNORMAL LOW (ref 150–400)
RBC: 2.33 MIL/uL — ABNORMAL LOW (ref 3.87–5.11)
RDW: 15.1 % (ref 11.5–15.5)
WBC Count: 5.4 10*3/uL (ref 4.0–10.5)
nRBC: 0 % (ref 0.0–0.2)

## 2021-05-08 LAB — CMP (CANCER CENTER ONLY)
ALT: 16 U/L (ref 0–44)
AST: 18 U/L (ref 15–41)
Albumin: 4.1 g/dL (ref 3.5–5.0)
Alkaline Phosphatase: 74 U/L (ref 38–126)
Anion gap: 11 (ref 5–15)
BUN: 19 mg/dL (ref 8–23)
CO2: 24 mmol/L (ref 22–32)
Calcium: 8.9 mg/dL (ref 8.9–10.3)
Chloride: 104 mmol/L (ref 98–111)
Creatinine: 1.3 mg/dL — ABNORMAL HIGH (ref 0.44–1.00)
GFR, Estimated: 46 mL/min — ABNORMAL LOW (ref >60)
Glucose, Bld: 113 mg/dL — ABNORMAL HIGH (ref 70–99)
Potassium: 3.6 mmol/L (ref 3.5–5.1)
Sodium: 139 mmol/L (ref 135–145)
Total Bilirubin: 0.4 mg/dL (ref 0.3–1.2)
Total Protein: 7 g/dL (ref 6.5–8.1)

## 2021-05-08 LAB — TSH: TSH: 4.035 u[IU]/mL (ref 0.350–4.500)

## 2021-05-08 MED ORDER — FOSAPREPITANT DIMEGLUMINE INJECTION 150 MG
150.0000 mg | Freq: Once | INTRAVENOUS | Status: AC
  Administered 2021-05-08: 150 mg via INTRAVENOUS
  Filled 2021-05-08: qty 5

## 2021-05-08 MED ORDER — SODIUM CHLORIDE 0.9% FLUSH
10.0000 mL | INTRAVENOUS | Status: DC | PRN
  Administered 2021-05-08: 10 mL
  Filled 2021-05-08: qty 10

## 2021-05-08 MED ORDER — SODIUM CHLORIDE 0.9 % IV SOLN
10.0000 mg | Freq: Once | INTRAVENOUS | Status: AC
  Administered 2021-05-08: 10 mg via INTRAVENOUS
  Filled 2021-05-08: qty 1

## 2021-05-08 MED ORDER — HEPARIN SOD (PORK) LOCK FLUSH 100 UNIT/ML IV SOLN
500.0000 [IU] | Freq: Once | INTRAVENOUS | Status: AC | PRN
  Administered 2021-05-08: 500 [IU]
  Filled 2021-05-08: qty 5

## 2021-05-08 MED ORDER — DIPHENHYDRAMINE HCL 50 MG/ML IJ SOLN
50.0000 mg | Freq: Once | INTRAMUSCULAR | Status: AC
  Administered 2021-05-08: 50 mg via INTRAVENOUS
  Filled 2021-05-08: qty 1

## 2021-05-08 MED ORDER — SODIUM CHLORIDE 0.9 % IV SOLN
Freq: Once | INTRAVENOUS | Status: AC
  Filled 2021-05-08: qty 250

## 2021-05-08 MED ORDER — SODIUM CHLORIDE 0.9 % IV SOLN: 340.0000 mg | Freq: Once | INTRAVENOUS | Status: DC

## 2021-05-08 MED ORDER — PACLITAXEL CHEMO INJECTION 300 MG/50ML
140.0000 mg/m2 | Freq: Once | INTRAVENOUS | Status: AC
  Administered 2021-05-08: 258 mg via INTRAVENOUS
  Filled 2021-05-08: qty 43

## 2021-05-08 MED ORDER — PALONOSETRON HCL INJECTION 0.25 MG/5ML
0.2500 mg | Freq: Once | INTRAVENOUS | Status: AC
  Administered 2021-05-08: 0.25 mg via INTRAVENOUS
  Filled 2021-05-08: qty 5

## 2021-05-08 MED ORDER — FAMOTIDINE 20 MG IN NS 100 ML IVPB
20.0000 mg | Freq: Once | INTRAVENOUS | Status: AC
  Administered 2021-05-08: 20 mg via INTRAVENOUS
  Filled 2021-05-08: qty 100

## 2021-05-08 MED ORDER — SODIUM CHLORIDE 0.9 % IV SOLN
237.0000 mg | Freq: Once | INTRAVENOUS | Status: AC
Start: 2021-05-08 — End: 2021-05-08
  Administered 2021-05-08: 240 mg via INTRAVENOUS
  Filled 2021-05-08: qty 24

## 2021-05-08 NOTE — Progress Notes (Signed)
Patient increased Scr today. Will dose reduce per MD.

## 2021-05-08 NOTE — Progress Notes (Signed)
Verbal order per Dr. Benay Spice: okay to treat with Scr. of 1.30

## 2021-05-08 NOTE — Progress Notes (Signed)
[facility] OFFICE PROGRESS NOTE   Diagnosis: Fallopian tube carcinoma  INTERVAL HISTORY:   Anna [name] completed another cycle of Taxol/carboplatin on 04/10/2021.  She reports 1 episode of nausea almost 3 weeks following chemotherapy.  This was relieved with Zofran.  No neuropathy symptoms.  No pain following G-CSF.  No difficulty with bowel function.  Objective:  Vital signs in last 24 hours:  Blood pressure (!) 142/92, pulse (!) 106, temperature 97.7 F (36.5 C), temperature source Oral, resp. rate 20, height _0  (1.6 m), weight 178 lb 9.6 oz (81 kg), SpO2 100 %.    HEENT: No thrush or ulcers Resp: Lungs clear bilaterally Cardio: Regular rate and rhythm GI: No mass, no hepatosplenomegaly, no apparent ascites, nontender Vascular: No leg edema  Portacath/PICC-without erythema  Lab Results:  Lab Results  Component Value Date   WBC 5.4 05/08/2021   HGB 8.5 (L) 05/08/2021   HCT 26.4 (L) 05/08/2021   MCV 113.3 (H) 05/08/2021   PLT 144 (L) 05/08/2021   NEUTROABS 3.0 05/08/2021    CMP  Lab Results  Component Value Date   NA 140 04/10/2021   K 3.2 (L) 04/10/2021   CL 107 04/10/2021   CO2 24 04/10/2021   GLUCOSE 115 (H) 04/10/2021   BUN 18 04/10/2021   CREATININE 0.81 04/10/2021   CALCIUM 8.3 (L) 04/10/2021   PROT 6.8 04/10/2021   ALBUMIN 3.7 04/10/2021   AST 16 04/10/2021   ALT 16 04/10/2021   ALKPHOS 69 04/10/2021   BILITOT 0.2 (L) 04/10/2021   GFRNONAA >60 04/10/2021   GFRAA >60 08/10/2020    Lab Results  Component Value Date   CEA1 <1.00 06/27/2017    Medications: I have reviewed the patient's current medications.   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, right fallopian tube, and bilateral ovaries,pT3,pNx, optimal debulking with small peritoneal studding of the diaphragm and a remaining thin rind of tumor at the posterior peritoneum in the pelvis Cycle 1 adjuvant Taxol/carboplatin 08/05/2017 Cycle 2 adjuvant Taxol/carboplatin 08/26/2017 Cycle 3 adjuvant Taxol/carboplatin 09/17/2017-Taxol dose reduced secondary to neuropathy and bone pain  Cycle 4 adjuvant Taxol/carboplatin 10/10/2017 Cycle 5 adjuvant Taxol/carboplatin 10/31/2017 Cycle 6 adjuvant Taxol/carboplatin 11/21/2017 CT abdomen/pelvis 08/08/2019- narrowing of rectosigmoid colon with eccentric serosal implant, 10 mm lymph node at the superior rectal vein, left upper quadrant soft tissue implant, 9 mm precaval node, 15 mm left internal iliac node, no ascites Cycle 1 Taxol/carboplatin 08/31/2019 Cycle 2 Taxol/carboplatin/Avastin 09/21/2019 Cycle 3 Taxol/Carboplatin 10/12/2019 (Avastin discontinued) Cycle 4 Taxol/carboplatin November 02, 2019 CT abdomen/pelvis 11/17/2019-decrease in size of previously prominent pericaval, left internal iliac, and superior rectal vein nodes, resolved rectosigmoid wall thickening Cycle 5 Taxol/carboplatin 11/22/2019 Cycle 6 Taxol/carboplatin 12/13/2019 Olaparib 01/05/2020 CT abdomen/pelvis 07/10/2020-enlarging soft tissue at the distal left ureter, the ureter is not dilated, peritoneal implant adjacent to the splenic flexure, enlarged left superior rectal node, lymph nodes along the course of the distal IMV have enlarged, eccentric thickening of the sigmoid colon not seen  Olaraparib discontinued 07/12/2020 Cycle 1 Taxol/carboplatin 07/21/2020 Cycle 2 Taxol/carboplatin 08/10/2020 (carboplatin dose reduced secondary to  thrombocytopenia) Cycle 3 Taxol/carboplatin 09/05/2020  Cycle 4 Taxol/carboplatin 09/26/2020 Cycle 5 Taxol/carboplatin 10/24/2020 CT abdomen/pelvis 11/13/2020-overall stable disease, no new site of metastatic disease, generally stable small abdominal/pelvic lymph nodes, slight enlargement of a peritoneal implant in the left pelvis, slight decrease in size of another pelvic implant Cycle 6 Taxol/carboplatin 11/14/2020 Cycle 7 Taxol/carboplatin 12/05/2020 Cycle 8 Taxol/carboplatin 12/26/2020 Cycle 9 Taxol/carboplatin 01/16/2021 Cycle 10 Taxol/carboplatin 02/06/2021 Cycle 11 Taxol/carboplatin 02/27/2021 Cycle 12 Taxol/carboplatin 03/20/2021 Cycle 13 Taxol/carboplatin 04/10/2021 Cycle 14 Taxol/carboplatin 05/08/2021   Depression   3.   Hypothyroid   4.   Family history of uterine cancer-paternal grandmother   66.   Acute onset dyspnea/pleuritic left-sided chest pain 10/02/2019 CT chest 10/04/2019-left lower lobe pulmonary emboli, multifocal pneumonia lower lobes On Xarelto, status post course of Levaquin.  6.  Acute onset left lower back pain 01/24/2021-resolved after a Medrol Dosepak    Disposition: Anna [name] appears stable.  She continues to tolerate the Taxol/carboplatin well.  The CA125 was lower when she was here 3 weeks ago.  We will follow-up on the CA125 from today.  She will complete another cycle of chemotherapy today.  Anna [name] will return for an office visit and chemotherapy in 3 weeks.  [name], MD  05/08/2021  8:33 AM

## 2021-05-08 NOTE — Patient Instructions (Signed)
[facility]   Discharge Instructions: Thank you for choosing [facility] to provide your oncology and hematology care.   If you have a lab appointment with the [facility], please go directly to the [facility] and check in at the registration area.   Wear comfortable clothing and clothing appropriate for easy access to any Portacath or PICC line.   We strive to give you quality time with your provider. You may need to reschedule your appointment if you arrive late (15 or more minutes).  Arriving late affects you and other patients whose appointments are after yours.  Also, if you miss three or more appointments without notifying the office, you may be dismissed from the clinic at the provider's discretion.      For prescription refill requests, have your pharmacy contact our office and allow 72 hours for refills to be completed.    Today you received the following chemotherapy and/or immunotherapy agents Paclitaxel (TAXOL) & Carboplatin (PARAPLATIN).      To help prevent nausea and vomiting after your treatment, we encourage you to take your nausea medication as directed.  BELOW ARE SYMPTOMS THAT SHOULD BE REPORTED IMMEDIATELY: *FEVER GREATER THAN 100.4 F (38 C) OR HIGHER *CHILLS OR SWEATING *NAUSEA AND VOMITING THAT IS NOT CONTROLLED WITH YOUR NAUSEA MEDICATION *UNUSUAL SHORTNESS OF BREATH *UNUSUAL BRUISING OR BLEEDING *URINARY PROBLEMS (pain or burning when urinating, or frequent urination) *BOWEL PROBLEMS (unusual diarrhea, constipation, pain near the anus) TENDERNESS IN MOUTH AND THROAT WITH OR WITHOUT PRESENCE OF ULCERS (sore throat, sores in mouth, or a toothache) UNUSUAL RASH, SWELLING OR PAIN  UNUSUAL VAGINAL DISCHARGE OR ITCHING   Items with * indicate a potential emergency and should be followed up as soon as possible or go to the Emergency Department if any problems should occur.  Please show the CHEMOTHERAPY ALERT CARD or  IMMUNOTHERAPY ALERT CARD at check-in to the Emergency Department and triage nurse.  Should you have questions after your visit or need to cancel or reschedule your appointment, please contact [facility]  Dept: [phone]  and follow the prompts.  Office hours are 8:00 a.m. to 4:30 p.m. Monday - Friday. Please note that voicemails left after 4:00 p.m. may not be returned until the following business day.  We are closed weekends and major holidays. You have access to a nurse at all times for urgent questions. Please call the main number to the clinic Dept: [phone] and follow the prompts.   For any non-urgent questions, you may also contact your provider using MyChart. We now offer e-Visits for anyone 30 and older to request care online for non-urgent symptoms. For details visit [email or web address].   Also download the MyChart app! Go to the app store, search "MyChart", open the app, select [facility], and log in with your MyChart username and password.  Due to Covid, a mask is required upon entering the hospital/clinic. If you do not have a mask, one will be given to you upon arrival. For doctor visits, patients may have 1 support person aged 15 or older with them. For treatment visits, patients cannot have anyone with them due to current Covid guidelines and our immunocompromised population.   Paclitaxel injection What is this medication? PACLITAXEL (PAK li TAX el) is a chemotherapy drug. It targets fast dividing cells, like cancer cells, and causes these cells to die. This medicine is used to treat ovarian cancer, breast cancer, lung cancer, Kaposi's sarcoma, andother  cancers. This medicine may be used for other purposes; ask your health care provider orpharmacist if you have questions. COMMON BRAND NAME(S): Onxol, Taxol What should I tell my care team before I take this medication? They need to know if you have any of these conditions: history of irregular  heartbeat liver disease low blood counts, like low white cell, platelet, or red cell counts lung or breathing disease, like asthma tingling of the fingers or toes, or other nerve disorder an unusual or allergic reaction to paclitaxel, alcohol, polyoxyethylated castor oil, other chemotherapy, other medicines, foods, dyes, or preservatives pregnant or trying to get pregnant breast-feeding How should I use this medication? This drug is given as an infusion into a vein. It is administered in a hospitalor clinic by a specially trained health care professional. Talk to your pediatrician regarding the use of this medicine in children.Special care may be needed. Overdosage: If you think you have taken too much of this medicine contact apoison control center or emergency room at once. NOTE: This medicine is only for you. Do not share this medicine with others. What if I miss a dose? It is important not to miss your dose. Call your doctor or health careprofessional if you are unable to keep an appointment. What may interact with this medication? Do not take this medicine with any of the following medications: live virus vaccines This medicine may also interact with the following medications: antiviral medicines for hepatitis, HIV or AIDS certain antibiotics like erythromycin and clarithromycin certain medicines for fungal infections like ketoconazole and itraconazole certain medicines for seizures like carbamazepine, phenobarbital, phenytoin gemfibrozil nefazodone rifampin St. John's wort This list may not describe all possible interactions. Give your health care provider a list of all the medicines, herbs, non-prescription drugs, or dietary supplements you use. Also tell them if you smoke, drink alcohol, or use illegaldrugs. Some items may interact with your medicine. What should I watch for while using this medication? Your condition will be monitored carefully while you are receiving this  medicine. You will need important blood work done while you are taking thismedicine. This medicine can cause serious allergic reactions. To reduce your risk you will need to take other medicine(s) before treatment with this medicine. If you experience allergic reactions like skin rash, itching or hives, swelling of theface, lips, or tongue, tell your doctor or health care professional right away. In some cases, you may be given additional medicines to help with side effects.Follow all directions for their use. This drug may make you feel generally unwell. This is not uncommon, as chemotherapy can affect healthy cells as well as cancer cells. Report any side effects. Continue your course of treatment even though you feel ill unless yourdoctor tells you to stop. Call your doctor or health care professional for advice if you get a fever, chills or sore throat, or other symptoms of a cold or flu. Do not treat yourself. This drug decreases your body's ability to fight infections. Try toavoid being around people who are sick. This medicine may increase your risk to bruise or bleed. Call your doctor orhealth care professional if you notice any unusual bleeding. Be careful brushing and flossing your teeth or using a toothpick because you may get an infection or bleed more easily. If you have any dental work done,tell your dentist you are receiving this medicine. Avoid taking products that contain aspirin, acetaminophen, ibuprofen, naproxen, or ketoprofen unless instructed by your doctor. These medicines may hide afever. Do not become  pregnant while taking this medicine. Women should inform their doctor if they wish to become pregnant or think they might be pregnant. There is a potential for serious side effects to an unborn child. Talk to your health care professional or pharmacist for more information. Do not breast-feed aninfant while taking this medicine. Men are advised not to father a child while receiving  this medicine. This product may contain alcohol. Ask your pharmacist or healthcare provider if this medicine contains alcohol. Be sure to tell all healthcare providers you are taking this medicine. Certain medicines, like metronidazole and disulfiram, can cause an unpleasant reaction when taken with alcohol. The reaction includes flushing, headache, nausea, vomiting, sweating, and increased thirst. Thereaction can last from 30 minutes to several hours. What side effects may I notice from receiving this medication? Side effects that you should report to your doctor or health care professionalas soon as possible: allergic reactions like skin rash, itching or hives, swelling of the face, lips, or tongue breathing problems changes in vision fast, irregular heartbeat high or low blood pressure mouth sores pain, tingling, numbness in the hands or feet signs of decreased platelets or bleeding - bruising, pinpoint red spots on the skin, black, tarry stools, blood in the urine signs of decreased red blood cells - unusually weak or tired, feeling faint or lightheaded, falls signs of infection - fever or chills, cough, sore throat, pain or difficulty passing urine signs and symptoms of liver injury like dark yellow or brown urine; general ill feeling or flu-like symptoms; light-colored stools; loss of appetite; nausea; right upper belly pain; unusually weak or tired; yellowing of the eyes or skin swelling of the ankles, feet, hands unusually slow heartbeat Side effects that usually do not require medical attention (report to yourdoctor or health care professional if they continue or are bothersome): diarrhea hair loss loss of appetite muscle or joint pain nausea, vomiting pain, redness, or irritation at site where injected tiredness This list may not describe all possible side effects. Call your doctor for medical advice about side effects. You may report side effects to FDA at1-800-FDA-1088. Where  should I keep my medication? This drug is given in a hospital or clinic and will not be stored at home. NOTE: This sheet is a summary. It may not cover all possible information. If you have questions about this medicine, talk to your doctor, pharmacist, orhealth care provider.  2022 Elsevier/Gold Standard (2019-10-06 13:37:23)  Carboplatin injection What is this medication? CARBOPLATIN (KAR boe pla tin) is a chemotherapy drug. It targets fast dividing cells, like cancer cells, and causes these cells to die. This medicine is usedto treat ovarian cancer and many other cancers. This medicine may be used for other purposes; ask your health care provider orpharmacist if you have questions. COMMON BRAND NAME(S): Paraplatin What should I tell my care team before I take this medication? They need to know if you have any of these conditions: blood disorders hearing problems kidney disease recent or ongoing radiation therapy an unusual or allergic reaction to carboplatin, cisplatin, other chemotherapy, other medicines, foods, dyes, or preservatives pregnant or trying to get pregnant breast-feeding How should I use this medication? This drug is usually given as an infusion into a vein. It is administered in [facility] or clinic by a specially trained health care professional. Talk to your pediatrician regarding the use of this medicine in children.Special care may be needed. Overdosage: If you think you have taken too much of this medicine contact apoison control  center or emergency room at once. NOTE: This medicine is only for you. Do not share this medicine with others. What if I miss a dose? It is important not to miss a dose. Call your doctor or health careprofessional if you are unable to keep an appointment. What may interact with this medication? medicines for seizures medicines to increase blood counts like filgrastim, pegfilgrastim, sargramostim some antibiotics like amikacin, gentamicin,  neomycin, streptomycin, tobramycin vaccines Talk to your doctor or health care professional before taking any of thesemedicines: acetaminophen aspirin ibuprofen ketoprofen naproxen This list may not describe all possible interactions. Give your health care provider a list of all the medicines, herbs, non-prescription drugs, or dietary supplements you use. Also tell them if you smoke, drink alcohol, or use illegaldrugs. Some items may interact with your medicine. What should I watch for while using this medication? Your condition will be monitored carefully while you are receiving this medicine. You will need important blood work done while you are taking thismedicine. This drug may make you feel generally unwell. This is not uncommon, as chemotherapy can affect healthy cells as well as cancer cells. Report any side effects. Continue your course of treatment even though you feel ill unless yourdoctor tells you to stop. In some cases, you may be given additional medicines to help with side effects.Follow all directions for their use. Call your doctor or health care professional for advice if you get a fever, chills or sore throat, or other symptoms of a cold or flu. Do not treat yourself. This drug decreases your body's ability to fight infections. Try toavoid being around people who are sick. This medicine may increase your risk to bruise or bleed. Call your doctor orhealth care professional if you notice any unusual bleeding. Be careful brushing and flossing your teeth or using a toothpick because you may get an infection or bleed more easily. If you have any dental work done,tell your dentist you are receiving this medicine. Avoid taking products that contain aspirin, acetaminophen, ibuprofen, naproxen, or ketoprofen unless instructed by your doctor. These medicines may hide afever. Do not become pregnant while taking this medicine. Women should inform their doctor if they wish to become pregnant  or think they might be pregnant. There is a potential for serious side effects to an unborn child. Talk to your health care professional or pharmacist for more information. Do not breast-feed aninfant while taking this medicine. What side effects may I notice from receiving this medication? Side effects that you should report to your doctor or health care professionalas soon as possible: allergic reactions like skin rash, itching or hives, swelling of the face, lips, or tongue signs of infection - fever or chills, cough, sore throat, pain or difficulty passing urine signs of decreased platelets or bleeding - bruising, pinpoint red spots on the skin, black, tarry stools, nosebleeds signs of decreased red blood cells - unusually weak or tired, fainting spells, lightheadedness breathing problems changes in hearing changes in vision chest pain high blood pressure low blood counts - This drug may decrease the number of white blood cells, red blood cells and platelets. You may be at increased risk for infections and bleeding. nausea and vomiting pain, swelling, redness or irritation at the injection site pain, tingling, numbness in the hands or feet problems with balance, talking, walking trouble passing urine or change in the amount of urine Side effects that usually do not require medical attention (report to yourdoctor or health care professional if they continue  or are bothersome): hair loss loss of appetite metallic taste in the mouth or changes in taste This list may not describe all possible side effects. Call your doctor for medical advice about side effects. You may report side effects to FDA at1-800-FDA-1088. Where should I keep my medication? This drug is given in a hospital or clinic and will not be stored at home. NOTE: This sheet is a summary. It may not cover all possible information. If you have questions about this medicine, talk to your doctor, pharmacist, orhealth care  provider.  2022 Elsevier/Gold Standard (2008-02-09 14:38:05)

## 2021-05-09 ENCOUNTER — Other Ambulatory Visit: Payer: Self-pay

## 2021-05-09 ENCOUNTER — Other Ambulatory Visit: Payer: Self-pay | Admitting: Internal Medicine

## 2021-05-09 ENCOUNTER — Other Ambulatory Visit: Payer: Self-pay | Admitting: Oncology

## 2021-05-09 ENCOUNTER — Telehealth: Payer: Self-pay | Admitting: Internal Medicine

## 2021-05-09 LAB — CA 125: Cancer Antigen (CA) 125: 32.8 U/mL (ref 0.0–38.1)

## 2021-05-09 MED ORDER — LEVOTHYROXINE SODIUM 100 MCG PO TABS: 100.0000 ug | ORAL_TABLET | Freq: Every day | ORAL | 1 refills | Status: DC

## 2021-05-09 NOTE — Telephone Encounter (Signed)
Reviewed lab.  TSH remains elevated.  Per note, she is on levothyroxine 135mcg q day.  Would like to increase to 12mcg q day.  Will need tsh checked in 6 weeks.  (Gets through oncology).  Thanks

## 2021-05-09 NOTE — Telephone Encounter (Signed)
Labs in patient's chart. I have also sent 90 day for her levothyroxine.

## 2021-05-09 NOTE — Telephone Encounter (Signed)
PT called to advise that the [facility] blood test that Dr.Scott wanted her oncologist to do is in her mychart and she would like for Dr.Scott to check them. PT also asked that she calls in her 100mg  Thyroid meds she calls it in for a 90 day refill.

## 2021-05-10 ENCOUNTER — Inpatient Hospital Stay: Payer: BC Managed Care – PPO

## 2021-05-10 ENCOUNTER — Ambulatory Visit: Payer: BC Managed Care – PPO

## 2021-05-10 ENCOUNTER — Other Ambulatory Visit: Payer: Self-pay

## 2021-05-10 ENCOUNTER — Other Ambulatory Visit: Payer: Self-pay | Admitting: Oncology

## 2021-05-10 VITALS — BP 142/79 | HR 91 | Temp 98.3°F | Resp 18

## 2021-05-10 DIAGNOSIS — Z86711 Personal history of pulmonary embolism: Secondary | ICD-10-CM | POA: Diagnosis not present

## 2021-05-10 DIAGNOSIS — C5702 Malignant neoplasm of left fallopian tube: Secondary | ICD-10-CM

## 2021-05-10 DIAGNOSIS — Z5111 Encounter for antineoplastic chemotherapy: Secondary | ICD-10-CM | POA: Diagnosis not present

## 2021-05-10 DIAGNOSIS — Z7901 Long term (current) use of anticoagulants: Secondary | ICD-10-CM | POA: Diagnosis not present

## 2021-05-10 DIAGNOSIS — C786 Secondary malignant neoplasm of retroperitoneum and peritoneum: Secondary | ICD-10-CM | POA: Diagnosis not present

## 2021-05-10 DIAGNOSIS — F32A Depression, unspecified: Secondary | ICD-10-CM | POA: Diagnosis not present

## 2021-05-10 DIAGNOSIS — Z808 Family history of malignant neoplasm of other organs or systems: Secondary | ICD-10-CM | POA: Diagnosis not present

## 2021-05-10 DIAGNOSIS — E039 Hypothyroidism, unspecified: Secondary | ICD-10-CM | POA: Diagnosis not present

## 2021-05-10 MED ORDER — LEVOTHYROXINE SODIUM 125 MCG PO TABS: 125.0000 ug | ORAL_TABLET | Freq: Every day | ORAL | 3 refills | Status: DC

## 2021-05-10 MED ORDER — PEGFILGRASTIM-CBQV 6 MG/0.6ML ~~LOC~~ SOSY
6.0000 mg | PREFILLED_SYRINGE | Freq: Once | SUBCUTANEOUS | Status: AC
  Administered 2021-05-10: 6 mg via SUBCUTANEOUS

## 2021-05-10 NOTE — Patient Instructions (Signed)

## 2021-05-10 NOTE — Telephone Encounter (Signed)
I have sent the levothyroxine 125 mcg to pharmacy & patient aware. She sees Dr. Benay Spice 06/19/21 & she will have TSH rechecked & can see lab has been ordered as future.

## 2021-05-11 ENCOUNTER — Encounter: Payer: Self-pay | Admitting: Oncology

## 2021-05-11 MED ORDER — POTASSIUM CHLORIDE CRYS ER 20 MEQ PO TBCR: 20.0000 meq | EXTENDED_RELEASE_TABLET | Freq: Every day | ORAL | 1 refills | Status: DC

## 2021-05-27 ENCOUNTER — Other Ambulatory Visit: Payer: Self-pay | Admitting: Oncology

## 2021-05-29 ENCOUNTER — Inpatient Hospital Stay (HOSPITAL_BASED_OUTPATIENT_CLINIC_OR_DEPARTMENT_OTHER): Payer: BC Managed Care – PPO | Admitting: Oncology

## 2021-05-29 ENCOUNTER — Other Ambulatory Visit: Payer: Self-pay

## 2021-05-29 ENCOUNTER — Other Ambulatory Visit: Payer: Self-pay | Admitting: Internal Medicine

## 2021-05-29 ENCOUNTER — Encounter: Payer: Self-pay | Admitting: Internal Medicine

## 2021-05-29 ENCOUNTER — Inpatient Hospital Stay: Payer: BC Managed Care – PPO

## 2021-05-29 ENCOUNTER — Other Ambulatory Visit: Payer: Self-pay | Admitting: *Deleted

## 2021-05-29 ENCOUNTER — Inpatient Hospital Stay: Payer: BC Managed Care – PPO | Attending: Oncology

## 2021-05-29 VITALS — BP 155/84 | HR 81

## 2021-05-29 VITALS — BP 172/98 | HR 102 | Temp 97.8°F | Resp 20 | Ht 63.0 in | Wt 178.6 lb

## 2021-05-29 DIAGNOSIS — Z5111 Encounter for antineoplastic chemotherapy: Secondary | ICD-10-CM | POA: Insufficient documentation

## 2021-05-29 DIAGNOSIS — I10 Essential (primary) hypertension: Secondary | ICD-10-CM

## 2021-05-29 DIAGNOSIS — F329 Major depressive disorder, single episode, unspecified: Secondary | ICD-10-CM | POA: Diagnosis not present

## 2021-05-29 DIAGNOSIS — M545 Low back pain, unspecified: Secondary | ICD-10-CM | POA: Insufficient documentation

## 2021-05-29 DIAGNOSIS — R0602 Shortness of breath: Secondary | ICD-10-CM | POA: Insufficient documentation

## 2021-05-29 DIAGNOSIS — E039 Hypothyroidism, unspecified: Secondary | ICD-10-CM | POA: Insufficient documentation

## 2021-05-29 DIAGNOSIS — C5702 Malignant neoplasm of left fallopian tube: Secondary | ICD-10-CM

## 2021-05-29 DIAGNOSIS — R112 Nausea with vomiting, unspecified: Secondary | ICD-10-CM | POA: Insufficient documentation

## 2021-05-29 DIAGNOSIS — E7800 Pure hypercholesterolemia, unspecified: Secondary | ICD-10-CM

## 2021-05-29 LAB — CMP (CANCER CENTER ONLY)
ALT: 14 U/L (ref 0–44)
AST: 15 U/L (ref 15–41)
Albumin: 4 g/dL (ref 3.5–5.0)
Alkaline Phosphatase: 84 U/L (ref 38–126)
Anion gap: 9 (ref 5–15)
BUN: 20 mg/dL (ref 8–23)
CO2: 26 mmol/L (ref 22–32)
Calcium: 9.4 mg/dL (ref 8.9–10.3)
Chloride: 103 mmol/L (ref 98–111)
Creatinine: 1.25 mg/dL — ABNORMAL HIGH (ref 0.44–1.00)
GFR, Estimated: 48 mL/min — ABNORMAL LOW (ref >60)
Glucose, Bld: 105 mg/dL — ABNORMAL HIGH (ref 70–99)
Potassium: 4.1 mmol/L (ref 3.5–5.1)
Sodium: 138 mmol/L (ref 135–145)
Total Bilirubin: 0.3 mg/dL (ref 0.3–1.2)
Total Protein: 6.9 g/dL (ref 6.5–8.1)

## 2021-05-29 LAB — CBC WITH DIFFERENTIAL (CANCER CENTER ONLY)
Abs Immature Granulocytes: 0.01 10*3/uL (ref 0.00–0.07)
Basophils Absolute: 0 10*3/uL (ref 0.0–0.1)
Basophils Relative: 0 %
Eosinophils Absolute: 0 10*3/uL (ref 0.0–0.5)
Eosinophils Relative: 1 %
HCT: 25.6 % — ABNORMAL LOW (ref 36.0–46.0)
Hemoglobin: 8.2 g/dL — ABNORMAL LOW (ref 12.0–15.0)
Immature Granulocytes: 0 %
Lymphocytes Relative: 51 %
Lymphs Abs: 2.5 10*3/uL (ref 0.7–4.0)
MCH: 35.8 pg — ABNORMAL HIGH (ref 26.0–34.0)
MCHC: 32 g/dL (ref 30.0–36.0)
MCV: 111.8 fL — ABNORMAL HIGH (ref 80.0–100.0)
Monocytes Absolute: 0.7 10*3/uL (ref 0.1–1.0)
Monocytes Relative: 14 %
Neutro Abs: 1.6 10*3/uL — ABNORMAL LOW (ref 1.7–7.7)
Neutrophils Relative %: 34 %
Platelet Count: 129 10*3/uL — ABNORMAL LOW (ref 150–400)
RBC: 2.29 MIL/uL — ABNORMAL LOW (ref 3.87–5.11)
RDW: 14.9 % (ref 11.5–15.5)
WBC Count: 4.8 10*3/uL (ref 4.0–10.5)
nRBC: 0 % (ref 0.0–0.2)

## 2021-05-29 MED ORDER — HEPARIN SOD (PORK) LOCK FLUSH 100 UNIT/ML IV SOLN
500.0000 [IU] | Freq: Once | INTRAVENOUS | Status: AC | PRN
  Administered 2021-05-29: 500 [IU]
  Filled 2021-05-29: qty 5

## 2021-05-29 MED ORDER — SODIUM CHLORIDE 0.9 % IV SOLN
10.0000 mg | Freq: Once | INTRAVENOUS | Status: AC
  Administered 2021-05-29: 10 mg via INTRAVENOUS
  Filled 2021-05-29: qty 1

## 2021-05-29 MED ORDER — SODIUM CHLORIDE 0.9% FLUSH
10.0000 mL | INTRAVENOUS | Status: DC | PRN
  Administered 2021-05-29: 10 mL
  Filled 2021-05-29: qty 10

## 2021-05-29 MED ORDER — FAMOTIDINE 20 MG IN NS 100 ML IVPB
20.0000 mg | Freq: Once | INTRAVENOUS | Status: AC
Start: 2021-05-29 — End: 2021-05-29
  Administered 2021-05-29: 20 mg via INTRAVENOUS
  Filled 2021-05-29: qty 100

## 2021-05-29 MED ORDER — AMLODIPINE BESYLATE 5 MG PO TABS: 5.0000 mg | ORAL_TABLET | Freq: Every day | ORAL | 2 refills | Status: DC

## 2021-05-29 MED ORDER — PALONOSETRON HCL INJECTION 0.25 MG/5ML
0.2500 mg | Freq: Once | INTRAVENOUS | Status: AC
  Administered 2021-05-29: 0.25 mg via INTRAVENOUS
  Filled 2021-05-29: qty 5

## 2021-05-29 MED ORDER — PACLITAXEL CHEMO INJECTION 300 MG/50ML
140.0000 mg/m2 | Freq: Once | INTRAVENOUS | Status: AC
  Administered 2021-05-29: 258 mg via INTRAVENOUS
  Filled 2021-05-29: qty 43

## 2021-05-29 MED ORDER — SODIUM CHLORIDE 0.9 % IV SOLN
Freq: Once | INTRAVENOUS | Status: AC
Start: 2021-05-29 — End: 2021-05-29
  Filled 2021-05-29: qty 250

## 2021-05-29 MED ORDER — SODIUM CHLORIDE 0.9 % IV SOLN
240.0000 mg | Freq: Once | INTRAVENOUS | Status: AC
  Administered 2021-05-29: 240 mg via INTRAVENOUS
  Filled 2021-05-29: qty 24

## 2021-05-29 MED ORDER — ONDANSETRON HCL 8 MG PO TABS: 8.0000 mg | ORAL_TABLET | Freq: Three times a day (TID) | ORAL | 2 refills | Status: AC | PRN

## 2021-05-29 MED ORDER — DIPHENHYDRAMINE HCL 50 MG/ML IJ SOLN
50.0000 mg | Freq: Once | INTRAMUSCULAR | Status: AC
  Administered 2021-05-29: 50 mg via INTRAVENOUS

## 2021-05-29 MED ORDER — SODIUM CHLORIDE 0.9 % IV SOLN
150.0000 mg | Freq: Once | INTRAVENOUS | Status: AC
  Administered 2021-05-29: 150 mg via INTRAVENOUS
  Filled 2021-05-29: qty 5

## 2021-05-29 NOTE — Patient Instructions (Signed)
Anna [name]   Discharge Instructions: Thank you for choosing [facility] to provide your oncology and hematology care.   If you have a lab appointment with the [facility], please go directly to the [facility] and check in at the registration area.   Wear comfortable clothing and clothing appropriate for easy access to any Portacath or PICC line.   We strive to give you quality time with your provider. You may need to reschedule your appointment if you arrive late (15 or more minutes).  Arriving late affects you and other patients whose appointments are after yours.  Also, if you miss three or more appointments without notifying the office, you may be dismissed from the clinic at the provider's discretion.      For prescription refill requests, have your pharmacy contact our office and allow 72 hours for refills to be completed.    Today you received the following chemotherapy and/or immunotherapy agents Paclitaxel (TAXOL) & Carboplatin (PARAPLATIN).      To help prevent nausea and vomiting after your treatment, we encourage you to take your nausea medication as directed.  BELOW ARE SYMPTOMS THAT SHOULD BE REPORTED IMMEDIATELY: *FEVER GREATER THAN 100.4 F (38 C) OR HIGHER *CHILLS OR SWEATING *NAUSEA AND VOMITING THAT IS NOT CONTROLLED WITH YOUR NAUSEA MEDICATION *UNUSUAL SHORTNESS OF BREATH *UNUSUAL BRUISING OR BLEEDING *URINARY PROBLEMS (pain or burning when urinating, or frequent urination) *BOWEL PROBLEMS (unusual diarrhea, constipation, pain near the anus) TENDERNESS IN MOUTH AND THROAT WITH OR WITHOUT PRESENCE OF ULCERS (sore throat, sores in mouth, or a toothache) UNUSUAL RASH, SWELLING OR PAIN  UNUSUAL VAGINAL DISCHARGE OR ITCHING   Items with * indicate a potential emergency and should be followed up as soon as possible or go to the Emergency Department if any problems should occur.  Please show the CHEMOTHERAPY ALERT CARD or  IMMUNOTHERAPY ALERT CARD at check-in to the Emergency Department and triage nurse.  Should you have questions after your visit or need to cancel or reschedule your appointment, please contact [facility]  Dept: [phone]  and follow the prompts.  Office hours are 8:00 a.m. to 4:30 p.m. Monday - Friday. Please note that voicemails left after 4:00 p.m. may not be returned until the following business day.  We are closed weekends and major holidays. You have access to a nurse at all times for urgent questions. Please call the main number to the clinic Dept: [phone] and follow the prompts.   For any non-urgent questions, you may also contact your provider using MyChart. We now offer e-Visits for anyone 61 and older to request care online for non-urgent symptoms. For details visit [email or web address].   Also download the MyChart app! Go to the app store, search "MyChart", open the app, select [facility], and log in with your MyChart username and password.  Due to Covid, a mask is required upon entering the hospital/clinic. If you do not have a mask, one will be given to you upon arrival. For doctor visits, patients may have 1 support person aged 49 or older with them. For treatment visits, patients cannot have anyone with them due to current Covid guidelines and our immunocompromised population.   Paclitaxel injection What is this medication? PACLITAXEL (PAK li TAX el) is a chemotherapy drug. It targets fast dividing cells, like cancer cells, and causes these cells to die. This medicine is used to treat ovarian cancer, breast cancer, lung cancer, Kaposi's sarcoma, andother  cancers. This medicine may be used for other purposes; ask your health care provider orpharmacist if you have questions. COMMON BRAND NAME(S): Onxol, Taxol What should I tell my care team before I take this medication? They need to know if you have any of these conditions: history of irregular  heartbeat liver disease low blood counts, like low white cell, platelet, or red cell counts lung or breathing disease, like asthma tingling of the fingers or toes, or other nerve disorder an unusual or allergic reaction to paclitaxel, alcohol, polyoxyethylated castor oil, other chemotherapy, other medicines, foods, dyes, or preservatives pregnant or trying to get pregnant breast-feeding How should I use this medication? This drug is given as an infusion into a vein. It is administered in a hospitalor clinic by a specially trained health care professional. Talk to your pediatrician regarding the use of this medicine in children.Special care may be needed. Overdosage: If you think you have taken too much of this medicine contact apoison control center or emergency room at once. NOTE: This medicine is only for you. Do not share this medicine with others. What if I miss a dose? It is important not to miss your dose. Call your doctor or health careprofessional if you are unable to keep an appointment. What may interact with this medication? Do not take this medicine with any of the following medications: live virus vaccines This medicine may also interact with the following medications: antiviral medicines for hepatitis, HIV or AIDS certain antibiotics like erythromycin and clarithromycin certain medicines for fungal infections like ketoconazole and itraconazole certain medicines for seizures like carbamazepine, phenobarbital, phenytoin gemfibrozil nefazodone rifampin St. John's wort This list may not describe all possible interactions. Give your health care provider a list of all the medicines, herbs, non-prescription drugs, or dietary supplements you use. Also tell them if you smoke, drink alcohol, or use illegaldrugs. Some items may interact with your medicine. What should I watch for while using this medication? Your condition will be monitored carefully while you are receiving this  medicine. You will need important blood work done while you are taking thismedicine. This medicine can cause serious allergic reactions. To reduce your risk you will need to take other medicine(s) before treatment with this medicine. If you experience allergic reactions like skin rash, itching or hives, swelling of theface, lips, or tongue, tell your doctor or health care professional right away. In some cases, you may be given additional medicines to help with side effects.Follow all directions for their use. This drug may make you feel generally unwell. This is not uncommon, as chemotherapy can affect healthy cells as well as cancer cells. Report any side effects. Continue your course of treatment even though you feel ill unless yourdoctor tells you to stop. Call your doctor or health care professional for advice if you get a fever, chills or sore throat, or other symptoms of a cold or flu. Do not treat yourself. This drug decreases your body's ability to fight infections. Try toavoid being around people who are sick. This medicine may increase your risk to bruise or bleed. Call your doctor orhealth care professional if you notice any unusual bleeding. Be careful brushing and flossing your teeth or using a toothpick because you may get an infection or bleed more easily. If you have any dental work done,tell your dentist you are receiving this medicine. Avoid taking products that contain aspirin, acetaminophen, ibuprofen, naproxen, or ketoprofen unless instructed by your doctor. These medicines may hide afever. Do not become  pregnant while taking this medicine. Women should inform their doctor if they wish to become pregnant or think they might be pregnant. There is a potential for serious side effects to an unborn child. Talk to your health care professional or pharmacist for more information. Do not breast-feed aninfant while taking this medicine. Men are advised not to father a child while receiving  this medicine. This product may contain alcohol. Ask your pharmacist or healthcare provider if this medicine contains alcohol. Be sure to tell all healthcare providers you are taking this medicine. Certain medicines, like metronidazole and disulfiram, can cause an unpleasant reaction when taken with alcohol. The reaction includes flushing, headache, nausea, vomiting, sweating, and increased thirst. Thereaction can last from 30 minutes to several hours. What side effects may I notice from receiving this medication? Side effects that you should report to your doctor or health care professionalas soon as possible: allergic reactions like skin rash, itching or hives, swelling of the face, lips, or tongue breathing problems changes in vision fast, irregular heartbeat high or low blood pressure mouth sores pain, tingling, numbness in the hands or feet signs of decreased platelets or bleeding - bruising, pinpoint red spots on the skin, black, tarry stools, blood in the urine signs of decreased red blood cells - unusually weak or tired, feeling faint or lightheaded, falls signs of infection - fever or chills, cough, sore throat, pain or difficulty passing urine signs and symptoms of liver injury like dark yellow or brown urine; general ill feeling or flu-like symptoms; light-colored stools; loss of appetite; nausea; right upper belly pain; unusually weak or tired; yellowing of the eyes or skin swelling of the ankles, feet, hands unusually slow heartbeat Side effects that usually do not require medical attention (report to yourdoctor or health care professional if they continue or are bothersome): diarrhea hair loss loss of appetite muscle or joint pain nausea, vomiting pain, redness, or irritation at site where injected tiredness This list may not describe all possible side effects. Call your doctor for medical advice about side effects. You may report side effects to FDA at1-800-FDA-1088. Where  should I keep my medication? This drug is given in a hospital or clinic and will not be stored at home. NOTE: This sheet is a summary. It may not cover all possible information. If you have questions about this medicine, talk to your doctor, pharmacist, orhealth care provider.  2022 Elsevier/Gold Standard (2019-10-06 13:37:23)  Carboplatin injection What is this medication? CARBOPLATIN (KAR boe pla tin) is a chemotherapy drug. It targets fast dividing cells, like cancer cells, and causes these cells to die. This medicine is usedto treat ovarian cancer and many other cancers. This medicine may be used for other purposes; ask your health care provider orpharmacist if you have questions. COMMON BRAND NAME(S): Paraplatin What should I tell my care team before I take this medication? They need to know if you have any of these conditions: blood disorders hearing problems kidney disease recent or ongoing radiation therapy an unusual or allergic reaction to carboplatin, cisplatin, other chemotherapy, other medicines, foods, dyes, or preservatives pregnant or trying to get pregnant breast-feeding How should I use this medication? This drug is usually given as an infusion into a vein. It is administered in [place] or clinic by a specially trained health care professional. Talk to your pediatrician regarding the use of this medicine in children.Special care may be needed. Overdosage: If you think you have taken too much of this medicine contact apoison control  center or emergency room at once. NOTE: This medicine is only for you. Do not share this medicine with others. What if I miss a dose? It is important not to miss a dose. Call your doctor or health careprofessional if you are unable to keep an appointment. What may interact with this medication? medicines for seizures medicines to increase blood counts like filgrastim, pegfilgrastim, sargramostim some antibiotics like amikacin, gentamicin,  neomycin, streptomycin, tobramycin vaccines Talk to your doctor or health care professional before taking any of thesemedicines: acetaminophen aspirin ibuprofen ketoprofen naproxen This list may not describe all possible interactions. Give your health care provider a list of all the medicines, herbs, non-prescription drugs, or dietary supplements you use. Also tell them if you smoke, drink alcohol, or use illegaldrugs. Some items may interact with your medicine. What should I watch for while using this medication? Your condition will be monitored carefully while you are receiving this medicine. You will need important blood work done while you are taking thismedicine. This drug may make you feel generally unwell. This is not uncommon, as chemotherapy can affect healthy cells as well as cancer cells. Report any side effects. Continue your course of treatment even though you feel ill unless yourdoctor tells you to stop. In some cases, you may be given additional medicines to help with side effects.Follow all directions for their use. Call your doctor or health care professional for advice if you get a fever, chills or sore throat, or other symptoms of a cold or flu. Do not treat yourself. This drug decreases your body's ability to fight infections. Try toavoid being around people who are sick. This medicine may increase your risk to bruise or bleed. Call your doctor orhealth care professional if you notice any unusual bleeding. Be careful brushing and flossing your teeth or using a toothpick because you may get an infection or bleed more easily. If you have any dental work done,tell your dentist you are receiving this medicine. Avoid taking products that contain aspirin, acetaminophen, ibuprofen, naproxen, or ketoprofen unless instructed by your doctor. These medicines may hide afever. Do not become pregnant while taking this medicine. Women should inform their doctor if they wish to become pregnant  or think they might be pregnant. There is a potential for serious side effects to an unborn child. Talk to your health care professional or pharmacist for more information. Do not breast-feed aninfant while taking this medicine. What side effects may I notice from receiving this medication? Side effects that you should report to your doctor or health care professionalas soon as possible: allergic reactions like skin rash, itching or hives, swelling of the face, lips, or tongue signs of infection - fever or chills, cough, sore throat, pain or difficulty passing urine signs of decreased platelets or bleeding - bruising, pinpoint red spots on the skin, black, tarry stools, nosebleeds signs of decreased red blood cells - unusually weak or tired, fainting spells, lightheadedness breathing problems changes in hearing changes in vision chest pain high blood pressure low blood counts - This drug may decrease the number of white blood cells, red blood cells and platelets. You may be at increased risk for infections and bleeding. nausea and vomiting pain, swelling, redness or irritation at the injection site pain, tingling, numbness in the hands or feet problems with balance, talking, walking trouble passing urine or change in the amount of urine Side effects that usually do not require medical attention (report to yourdoctor or health care professional if they continue  or are bothersome): hair loss loss of appetite metallic taste in the mouth or changes in taste This list may not describe all possible side effects. Call your doctor for medical advice about side effects. You may report side effects to FDA at1-800-FDA-1088. Where should I keep my medication? This drug is given in a hospital or clinic and will not be stored at home. NOTE: This sheet is a summary. It may not cover all possible information. If you have questions about this medicine, talk to your doctor, pharmacist, orhealth care  provider.  2022 Elsevier/Gold Standard (2008-02-09 14:38:05)

## 2021-05-29 NOTE — Telephone Encounter (Signed)
Rx sent in for amlodipine 5mg  #30 with 2 refills.

## 2021-05-29 NOTE — Telephone Encounter (Signed)
I am ok with starting amlodipine.  Does she want an appt to see me?  I can work her in and check blood pressure and start her on medication if needed.

## 2021-05-29 NOTE — Progress Notes (Signed)
[facility] OFFICE PROGRESS NOTE   Diagnosis: Fallopian tube carcinoma  INTERVAL HISTORY:   Anna [name] completed another cycle of Taxol/carboplatin on 05/08/2021.  She reports 1 episode of nausea/vomiting on day 7 following the last 2 cycles of chemotherapy.  She has mild nausea the following day.  No difficulty with bowel function.  No peripheral numbness.  No bone pain.  Mild exertional dyspnea.  Her blood pressure has been running high at home with a diastolic of 15-830.  Objective:  Vital signs in last 24 hours:  Blood pressure (!) 172/98, pulse (!) 102, temperature 97.8 F (36.6 C), temperature source Oral, resp. rate 20, height 5' 3"  (1.6 m), weight 178 lb 9.6 oz (81 kg), SpO2 100 %.    HEENT: No thrush or ulcers Resp: Lungs clear bilaterally Cardio: Regular rate and rhythm GI: No mass, nontender, no hepatosplenomegaly Vascular: No leg edema    Portacath/PICC-without erythema  Lab Results:  Lab Results  Component Value Date   WBC 4.8 05/29/2021   HGB 8.2 (L) 05/29/2021   HCT 25.6 (L) 05/29/2021   MCV 111.8 (H) 05/29/2021   PLT 129 (L) 05/29/2021   NEUTROABS 1.6 (L) 05/29/2021    CMP  Lab Results  Component Value Date   NA 139 05/08/2021   K 3.6 05/08/2021   CL 104 05/08/2021   CO2 24 05/08/2021   GLUCOSE 113 (H) 05/08/2021   BUN 19 05/08/2021   CREATININE 1.30 (H) 05/08/2021   CALCIUM 8.9 05/08/2021   PROT 7.0 05/08/2021   ALBUMIN 4.1 05/08/2021   AST 18 05/08/2021   ALT 16 05/08/2021   ALKPHOS 74 05/08/2021   BILITOT 0.4 05/08/2021   GFRNONAA 46 (L) 05/08/2021   GFRAA >60 08/10/2020    Lab Results  Component Value Date   CEA1 <1.00 06/27/2017     Medications: I have reviewed the patient's current medications.   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 omentum, right fallopian tube, and bilateral ovaries,pT3,pNx, optimal debulking with small peritoneal studding of the diaphragm and a remaining thin rind of tumor at the posterior peritoneum in the pelvis Cycle 1 adjuvant Taxol/carboplatin 08/05/2017 Cycle 2 adjuvant Taxol/carboplatin 08/26/2017 Cycle 3 adjuvant Taxol/carboplatin 09/17/2017-Taxol dose reduced secondary to neuropathy and bone pain  Cycle 4 adjuvant Taxol/carboplatin 10/10/2017 Cycle 5 adjuvant Taxol/carboplatin 10/31/2017 Cycle 6 adjuvant Taxol/carboplatin 11/21/2017 CT abdomen/pelvis 08/08/2019- narrowing of rectosigmoid colon with eccentric serosal implant, 10 mm lymph node at the superior rectal vein, left upper quadrant soft tissue implant, 9 mm precaval node, 15 mm left internal iliac node, no ascites Cycle 1 Taxol/carboplatin 08/31/2019 Cycle 2 Taxol/carboplatin/Avastin 09/21/2019 Cycle 3 Taxol/Carboplatin 10/12/2019 (Avastin discontinued) Cycle 4 Taxol/carboplatin November 02, 2019 CT abdomen/pelvis 11/17/2019-decrease in size of previously prominent pericaval, left internal iliac, and superior rectal vein nodes, resolved rectosigmoid wall thickening Cycle 5 Taxol/carboplatin 11/22/2019 Cycle 6 Taxol/carboplatin 12/13/2019 Olaparib 01/05/2020 CT abdomen/pelvis 07/10/2020-enlarging soft tissue at the distal left ureter, the ureter is not dilated, peritoneal implant adjacent to the splenic flexure, enlarged left superior rectal node, lymph nodes along the course of the distal IMV have enlarged, eccentric thickening of the sigmoid colon  not seen  Olaraparib discontinued  07/12/2020 Cycle 1 Taxol/carboplatin 07/21/2020 Cycle 2 Taxol/carboplatin 08/10/2020 (carboplatin dose reduced secondary to thrombocytopenia) Cycle 3 Taxol/carboplatin 09/05/2020 Cycle 4 Taxol/carboplatin 09/26/2020 Cycle 5 Taxol/carboplatin 10/24/2020 CT abdomen/pelvis 11/13/2020-overall stable disease, no new site of metastatic disease, generally stable small abdominal/pelvic lymph nodes, slight enlargement of a peritoneal implant in the left pelvis, slight decrease in size of another pelvic implant Cycle 6 Taxol/carboplatin 11/14/2020 Cycle 7 Taxol/carboplatin 12/05/2020 Cycle 8 Taxol/carboplatin 12/26/2020 Cycle 9 Taxol/carboplatin 01/16/2021 Cycle 10 Taxol/carboplatin 02/06/2021 Cycle 11 Taxol/carboplatin 02/27/2021 Cycle 12 Taxol/carboplatin 03/20/2021 Cycle 13 Taxol/carboplatin 04/10/2021 Cycle 14 Taxol/carboplatin 05/08/2021 Cycle 15 Taxol/carboplatin 05/29/2021   Depression   3.   Hypothyroid   4.   Family history of uterine cancer-paternal grandmother   53.   Acute onset dyspnea/pleuritic left-sided chest pain 10/02/2019 CT chest 10/04/2019-left lower lobe pulmonary emboli, multifocal pneumonia lower lobes On Xarelto, status post course of Levaquin.  6.  Acute onset left lower back pain 01/24/2021-resolved after a Medrol Dosepak     Disposition: Anna [name] appears unchanged.  She will take ondansetron on day 7 in an attempt to prevent nausea.  The CA125 was stable last month.  The plan is to continue Taxol/carboplatin every 3 weeks.  The creatinine is mildly elevated and her blood pressure is higher.  She will consult with Dr. Nicki Reaper for management of hypertension.  I will refer her for a renal ultrasound to look for evidence of hydronephrosis.  Anna [name] will return for an office visit and chemotherapy in 3 weeks.  [name], MD  05/29/2021  8:31 AM

## 2021-05-29 NOTE — Telephone Encounter (Signed)
Patient sees Oncology every 3 weeks and she is getting BP checks with them she prefers not to have an Appointment at this time due to so many with Oncology, she wants to know if you will use their readings and prescribe the Amlodipine?

## 2021-05-29 NOTE — Progress Notes (Signed)
Unable to schedule renal US at [facility] until 7/21. Patient agrees to [facility]. Scheduled for 7/14 at [facility] at 1045/1100 and moved her injection to 10am. Will need full bladder. No PA needed per managed care.

## 2021-05-30 ENCOUNTER — Other Ambulatory Visit: Payer: Self-pay | Admitting: Oncology

## 2021-05-30 DIAGNOSIS — C5702 Malignant neoplasm of left fallopian tube: Secondary | ICD-10-CM

## 2021-05-30 LAB — CA 125: Cancer Antigen (CA) 125: 39.1 U/mL — ABNORMAL HIGH (ref 0.0–38.1)

## 2021-05-30 NOTE — Addendum Note (Signed)
Addended by: Alisa Graff on: 05/30/2021 02:48 PM   Modules accepted: Orders

## 2021-05-30 NOTE — Telephone Encounter (Signed)
Orders placed for lipid panel and tsh.  Messaged Dr Benay Spice and [name].

## 2021-05-31 ENCOUNTER — Inpatient Hospital Stay: Payer: BC Managed Care – PPO

## 2021-05-31 ENCOUNTER — Other Ambulatory Visit: Payer: Self-pay

## 2021-05-31 ENCOUNTER — Ambulatory Visit (HOSPITAL_BASED_OUTPATIENT_CLINIC_OR_DEPARTMENT_OTHER)
Admission: RE | Admit: 2021-05-31 | Discharge: 2021-05-31 | Disposition: A | Discharge status: Home / Self Care | Payer: BC Managed Care – PPO | Source: Ambulatory Visit | Attending: Oncology | Admitting: Oncology

## 2021-05-31 VITALS — BP 150/83 | HR 103 | Temp 98.4°F | Resp 18

## 2021-05-31 DIAGNOSIS — Z5111 Encounter for antineoplastic chemotherapy: Secondary | ICD-10-CM | POA: Diagnosis not present

## 2021-05-31 DIAGNOSIS — M545 Low back pain, unspecified: Secondary | ICD-10-CM | POA: Diagnosis not present

## 2021-05-31 DIAGNOSIS — R112 Nausea with vomiting, unspecified: Secondary | ICD-10-CM | POA: Diagnosis not present

## 2021-05-31 DIAGNOSIS — F329 Major depressive disorder, single episode, unspecified: Secondary | ICD-10-CM | POA: Diagnosis not present

## 2021-05-31 DIAGNOSIS — E039 Hypothyroidism, unspecified: Secondary | ICD-10-CM | POA: Diagnosis not present

## 2021-05-31 DIAGNOSIS — I10 Essential (primary) hypertension: Secondary | ICD-10-CM | POA: Diagnosis not present

## 2021-05-31 DIAGNOSIS — C5702 Malignant neoplasm of left fallopian tube: Secondary | ICD-10-CM | POA: Insufficient documentation

## 2021-05-31 DIAGNOSIS — N1330 Unspecified hydronephrosis: Secondary | ICD-10-CM | POA: Diagnosis not present

## 2021-05-31 DIAGNOSIS — R0602 Shortness of breath: Secondary | ICD-10-CM | POA: Diagnosis not present

## 2021-05-31 DIAGNOSIS — K760 Fatty (change of) liver, not elsewhere classified: Secondary | ICD-10-CM | POA: Diagnosis not present

## 2021-05-31 MED ORDER — PEGFILGRASTIM-CBQV 6 MG/0.6ML ~~LOC~~ SOSY
6.0000 mg | PREFILLED_SYRINGE | Freq: Once | SUBCUTANEOUS | Status: AC
  Administered 2021-05-31: 6 mg via SUBCUTANEOUS

## 2021-05-31 NOTE — Patient Instructions (Signed)

## 2021-06-04 ENCOUNTER — Telehealth: Payer: Self-pay

## 2021-06-04 NOTE — Telephone Encounter (Signed)
reviewed Ca and [place] results with pt. verbalized understanding

## 2021-06-04 NOTE — Telephone Encounter (Signed)
pt called to see if you hadd the renal US results done 7/14?  Also wondering if you are concerned about CA125 increased by 6?

## 2021-06-07 ENCOUNTER — Other Ambulatory Visit: Payer: BC Managed Care – PPO

## 2021-06-15 ENCOUNTER — Other Ambulatory Visit: Payer: Self-pay | Admitting: Oncology

## 2021-06-19 ENCOUNTER — Other Ambulatory Visit: Payer: Self-pay

## 2021-06-19 ENCOUNTER — Inpatient Hospital Stay: Payer: BC Managed Care – PPO | Attending: Oncology

## 2021-06-19 ENCOUNTER — Inpatient Hospital Stay: Payer: BC Managed Care – PPO

## 2021-06-19 ENCOUNTER — Inpatient Hospital Stay (HOSPITAL_BASED_OUTPATIENT_CLINIC_OR_DEPARTMENT_OTHER): Payer: BC Managed Care – PPO | Admitting: Oncology

## 2021-06-19 VITALS — BP 149/86 | HR 91 | Temp 97.8°F | Resp 18 | Ht 63.0 in | Wt 178.0 lb

## 2021-06-19 DIAGNOSIS — R944 Abnormal results of kidney function studies: Secondary | ICD-10-CM | POA: Diagnosis not present

## 2021-06-19 DIAGNOSIS — N1330 Unspecified hydronephrosis: Secondary | ICD-10-CM | POA: Insufficient documentation

## 2021-06-19 DIAGNOSIS — Z808 Family history of malignant neoplasm of other organs or systems: Secondary | ICD-10-CM | POA: Insufficient documentation

## 2021-06-19 DIAGNOSIS — C5702 Malignant neoplasm of left fallopian tube: Secondary | ICD-10-CM

## 2021-06-19 DIAGNOSIS — F329 Major depressive disorder, single episode, unspecified: Secondary | ICD-10-CM | POA: Insufficient documentation

## 2021-06-19 DIAGNOSIS — E039 Hypothyroidism, unspecified: Secondary | ICD-10-CM | POA: Diagnosis not present

## 2021-06-19 DIAGNOSIS — M545 Low back pain, unspecified: Secondary | ICD-10-CM | POA: Diagnosis not present

## 2021-06-19 DIAGNOSIS — Z452 Encounter for adjustment and management of vascular access device: Secondary | ICD-10-CM | POA: Insufficient documentation

## 2021-06-19 DIAGNOSIS — Z7901 Long term (current) use of anticoagulants: Secondary | ICD-10-CM | POA: Insufficient documentation

## 2021-06-19 DIAGNOSIS — Z5111 Encounter for antineoplastic chemotherapy: Secondary | ICD-10-CM | POA: Diagnosis not present

## 2021-06-19 DIAGNOSIS — Z5189 Encounter for other specified aftercare: Secondary | ICD-10-CM | POA: Insufficient documentation

## 2021-06-19 LAB — CMP (CANCER CENTER ONLY)
ALT: 15 U/L (ref 0–44)
AST: 16 U/L (ref 15–41)
Albumin: 4 g/dL (ref 3.5–5.0)
Alkaline Phosphatase: 74 U/L (ref 38–126)
Anion gap: 9 (ref 5–15)
BUN: 22 mg/dL (ref 8–23)
CO2: 26 mmol/L (ref 22–32)
Calcium: 8.7 mg/dL — ABNORMAL LOW (ref 8.9–10.3)
Chloride: 103 mmol/L (ref 98–111)
Creatinine: 1.29 mg/dL — ABNORMAL HIGH (ref 0.44–1.00)
GFR, Estimated: 46 mL/min — ABNORMAL LOW (ref >60)
Glucose, Bld: 104 mg/dL — ABNORMAL HIGH (ref 70–99)
Potassium: 3.8 mmol/L (ref 3.5–5.1)
Sodium: 138 mmol/L (ref 135–145)
Total Bilirubin: 0.4 mg/dL (ref 0.3–1.2)
Total Protein: 7 g/dL (ref 6.5–8.1)

## 2021-06-19 LAB — CBC WITH DIFFERENTIAL (CANCER CENTER ONLY)
Abs Immature Granulocytes: 0.01 10*3/uL (ref 0.00–0.07)
Basophils Absolute: 0 10*3/uL (ref 0.0–0.1)
Basophils Relative: 1 %
Eosinophils Absolute: 0 10*3/uL (ref 0.0–0.5)
Eosinophils Relative: 1 %
HCT: 25.4 % — ABNORMAL LOW (ref 36.0–46.0)
Hemoglobin: 8 g/dL — ABNORMAL LOW (ref 12.0–15.0)
Immature Granulocytes: 0 %
Lymphocytes Relative: 46 %
Lymphs Abs: 1.7 10*3/uL (ref 0.7–4.0)
MCH: 36.2 pg — ABNORMAL HIGH (ref 26.0–34.0)
MCHC: 31.5 g/dL (ref 30.0–36.0)
MCV: 114.9 fL — ABNORMAL HIGH (ref 80.0–100.0)
Monocytes Absolute: 0.6 10*3/uL (ref 0.1–1.0)
Monocytes Relative: 16 %
Neutro Abs: 1.3 10*3/uL — ABNORMAL LOW (ref 1.7–7.7)
Neutrophils Relative %: 36 %
Platelet Count: 128 10*3/uL — ABNORMAL LOW (ref 150–400)
RBC: 2.21 MIL/uL — ABNORMAL LOW (ref 3.87–5.11)
RDW: 15.8 % — ABNORMAL HIGH (ref 11.5–15.5)
WBC Count: 3.7 10*3/uL — ABNORMAL LOW (ref 4.0–10.5)
nRBC: 0 % (ref 0.0–0.2)

## 2021-06-19 LAB — TSH: TSH: 0.311 u[IU]/mL — ABNORMAL LOW (ref 0.350–4.500)

## 2021-06-19 MED ORDER — POTASSIUM CHLORIDE CRYS ER 20 MEQ PO TBCR: 20.0000 meq | EXTENDED_RELEASE_TABLET | Freq: Every day | ORAL | 0 refills | Status: DC

## 2021-06-19 MED ORDER — SODIUM CHLORIDE 0.9 % IV SOLN
240.0000 mg | Freq: Once | INTRAVENOUS | Status: AC
  Administered 2021-06-19: 240 mg via INTRAVENOUS
  Filled 2021-06-19: qty 24

## 2021-06-19 MED ORDER — DIPHENHYDRAMINE HCL 50 MG/ML IJ SOLN
50.0000 mg | Freq: Once | INTRAMUSCULAR | Status: AC
  Administered 2021-06-19: 50 mg via INTRAVENOUS
  Filled 2021-06-19: qty 1

## 2021-06-19 MED ORDER — SODIUM CHLORIDE 0.9 % IV SOLN
10.0000 mg | Freq: Once | INTRAVENOUS | Status: AC
  Administered 2021-06-19: 10 mg via INTRAVENOUS
  Filled 2021-06-19: qty 1

## 2021-06-19 MED ORDER — PALONOSETRON HCL INJECTION 0.25 MG/5ML
0.2500 mg | Freq: Once | INTRAVENOUS | Status: AC
  Administered 2021-06-19: 0.25 mg via INTRAVENOUS
  Filled 2021-06-19: qty 5

## 2021-06-19 MED ORDER — FAMOTIDINE 20 MG IN NS 100 ML IVPB
20.0000 mg | Freq: Once | INTRAVENOUS | Status: AC
  Administered 2021-06-19: 20 mg via INTRAVENOUS
  Filled 2021-06-19: qty 100

## 2021-06-19 MED ORDER — SODIUM CHLORIDE 0.9 % IV SOLN
Freq: Once | INTRAVENOUS | Status: AC
  Filled 2021-06-19: qty 250

## 2021-06-19 MED ORDER — SODIUM CHLORIDE 0.9 % IV SOLN
150.0000 mg | Freq: Once | INTRAVENOUS | Status: AC
  Administered 2021-06-19: 150 mg via INTRAVENOUS
  Filled 2021-06-19: qty 5

## 2021-06-19 MED ORDER — HEPARIN SOD (PORK) LOCK FLUSH 100 UNIT/ML IV SOLN
500.0000 [IU] | Freq: Once | INTRAVENOUS | Status: AC | PRN
  Administered 2021-06-19: 500 [IU]
  Filled 2021-06-19: qty 5

## 2021-06-19 MED ORDER — SODIUM CHLORIDE 0.9% FLUSH
10.0000 mL | INTRAVENOUS | Status: DC | PRN
  Administered 2021-06-19: 10 mL
  Filled 2021-06-19: qty 10

## 2021-06-19 MED ORDER — SODIUM CHLORIDE 0.9 % IV SOLN
140.0000 mg/m2 | Freq: Once | INTRAVENOUS | Status: AC
  Administered 2021-06-19: 258 mg via INTRAVENOUS
  Filled 2021-06-19: qty 43

## 2021-06-19 NOTE — Patient Instructions (Signed)
[facility]   Discharge Instructions: Thank you for choosing [facility] to provide your oncology and hematology care.   If you have a lab appointment with the [facility], please go directly to the [facility] and check in at the registration area.   Wear comfortable clothing and clothing appropriate for easy access to any Portacath or PICC line.   We strive to give you quality time with your provider. You may need to reschedule your appointment if you arrive late (15 or more minutes).  Arriving late affects you and other patients whose appointments are after yours.  Also, if you miss three or more appointments without notifying the office, you may be dismissed from the clinic at the provider's discretion.      For prescription refill requests, have your pharmacy contact our office and allow 72 hours for refills to be completed.    Today you received the following chemotherapy and/or immunotherapy agents Paclitaxel (TAXOL) & Carboplatin (PARAPLATIN)      To help prevent nausea and vomiting after your treatment, we encourage you to take your nausea medication as directed.  BELOW ARE SYMPTOMS THAT SHOULD BE REPORTED IMMEDIATELY: *FEVER GREATER THAN 100.4 F (38 C) OR HIGHER *CHILLS OR SWEATING *NAUSEA AND VOMITING THAT IS NOT CONTROLLED WITH YOUR NAUSEA MEDICATION *UNUSUAL SHORTNESS OF BREATH *UNUSUAL BRUISING OR BLEEDING *URINARY PROBLEMS (pain or burning when urinating, or frequent urination) *BOWEL PROBLEMS (unusual diarrhea, constipation, pain near the anus) TENDERNESS IN MOUTH AND THROAT WITH OR WITHOUT PRESENCE OF ULCERS (sore throat, sores in mouth, or a toothache) UNUSUAL RASH, SWELLING OR PAIN  UNUSUAL VAGINAL DISCHARGE OR ITCHING   Items with * indicate a potential emergency and should be followed up as soon as possible or go to the Emergency Department if any problems should occur.  Please show the CHEMOTHERAPY ALERT CARD or  IMMUNOTHERAPY ALERT CARD at check-in to the Emergency Department and triage nurse.  Should you have questions after your visit or need to cancel or reschedule your appointment, please contact [facility]  Dept: [phone]  and follow the prompts.  Office hours are 8:00 a.m. to 4:30 p.m. Monday - Friday. Please note that voicemails left after 4:00 p.m. may not be returned until the following business day.  We are closed weekends and major holidays. You have access to a nurse at all times for urgent questions. Please call the main number to the clinic Dept: [phone] and follow the prompts.   For any non-urgent questions, you may also contact your provider using MyChart. We now offer e-Visits for anyone 23 and older to request care online for non-urgent symptoms. For details visit [email or web address].   Also download the MyChart app! Go to the app store, search "MyChart", open the app, select [facility], and log in with your MyChart username and password.  Due to Covid, a mask is required upon entering the hospital/clinic. If you do not have a mask, one will be given to you upon arrival. For doctor visits, patients may have 1 support person aged 40 or older with them. For treatment visits, patients cannot have anyone with them due to current Covid guidelines and our immunocompromised population.   Paclitaxel injection What is this medication? PACLITAXEL (PAK li TAX el) is a chemotherapy drug. It targets fast dividing cells, like cancer cells, and causes these cells to die. This medicine is used to treat ovarian cancer, breast cancer, lung cancer, Kaposi's sarcoma, andother  cancers. This medicine may be used for other purposes; ask your health care provider orpharmacist if you have questions. COMMON BRAND NAME(S): Onxol, Taxol What should I tell my care team before I take this medication? They need to know if you have any of these conditions: history of irregular  heartbeat liver disease low blood counts, like low white cell, platelet, or red cell counts lung or breathing disease, like asthma tingling of the fingers or toes, or other nerve disorder an unusual or allergic reaction to paclitaxel, alcohol, polyoxyethylated castor oil, other chemotherapy, other medicines, foods, dyes, or preservatives pregnant or trying to get pregnant breast-feeding How should I use this medication? This drug is given as an infusion into a vein. It is administered in a hospitalor clinic by a specially trained health care professional. Talk to your pediatrician regarding the use of this medicine in children.Special care may be needed. Overdosage: If you think you have taken too much of this medicine contact apoison control center or emergency room at once. NOTE: This medicine is only for you. Do not share this medicine with others. What if I miss a dose? It is important not to miss your dose. Call your doctor or health careprofessional if you are unable to keep an appointment. What may interact with this medication? Do not take this medicine with any of the following medications: live virus vaccines This medicine may also interact with the following medications: antiviral medicines for hepatitis, HIV or AIDS certain antibiotics like erythromycin and clarithromycin certain medicines for fungal infections like ketoconazole and itraconazole certain medicines for seizures like carbamazepine, phenobarbital, phenytoin gemfibrozil nefazodone rifampin St. John's wort This list may not describe all possible interactions. Give your health care provider a list of all the medicines, herbs, non-prescription drugs, or dietary supplements you use. Also tell them if you smoke, drink alcohol, or use illegaldrugs. Some items may interact with your medicine. What should I watch for while using this medication? Your condition will be monitored carefully while you are receiving this  medicine. You will need important blood work done while you are taking thismedicine. This medicine can cause serious allergic reactions. To reduce your risk you will need to take other medicine(s) before treatment with this medicine. If you experience allergic reactions like skin rash, itching or hives, swelling of theface, lips, or tongue, tell your doctor or health care professional right away. In some cases, you may be given additional medicines to help with side effects.Follow all directions for their use. This drug may make you feel generally unwell. This is not uncommon, as chemotherapy can affect healthy cells as well as cancer cells. Report any side effects. Continue your course of treatment even though you feel ill unless yourdoctor tells you to stop. Call your doctor or health care professional for advice if you get a fever, chills or sore throat, or other symptoms of a cold or flu. Do not treat yourself. This drug decreases your body's ability to fight infections. Try toavoid being around people who are sick. This medicine may increase your risk to bruise or bleed. Call your doctor orhealth care professional if you notice any unusual bleeding. Be careful brushing and flossing your teeth or using a toothpick because you may get an infection or bleed more easily. If you have any dental work done,tell your dentist you are receiving this medicine. Avoid taking products that contain aspirin, acetaminophen, ibuprofen, naproxen, or ketoprofen unless instructed by your doctor. These medicines may hide afever. Do not become  pregnant while taking this medicine. Women should inform their doctor if they wish to become pregnant or think they might be pregnant. There is a potential for serious side effects to an unborn child. Talk to your health care professional or pharmacist for more information. Do not breast-feed aninfant while taking this medicine. Men are advised not to father a child while receiving  this medicine. This product may contain alcohol. Ask your pharmacist or healthcare provider if this medicine contains alcohol. Be sure to tell all healthcare providers you are taking this medicine. Certain medicines, like metronidazole and disulfiram, can cause an unpleasant reaction when taken with alcohol. The reaction includes flushing, headache, nausea, vomiting, sweating, and increased thirst. Thereaction can last from 30 minutes to several hours. What side effects may I notice from receiving this medication? Side effects that you should report to your doctor or health care professionalas soon as possible: allergic reactions like skin rash, itching or hives, swelling of the face, lips, or tongue breathing problems changes in vision fast, irregular heartbeat high or low blood pressure mouth sores pain, tingling, numbness in the hands or feet signs of decreased platelets or bleeding - bruising, pinpoint red spots on the skin, black, tarry stools, blood in the urine signs of decreased red blood cells - unusually weak or tired, feeling faint or lightheaded, falls signs of infection - fever or chills, cough, sore throat, pain or difficulty passing urine signs and symptoms of liver injury like dark yellow or brown urine; general ill feeling or flu-like symptoms; light-colored stools; loss of appetite; nausea; right upper belly pain; unusually weak or tired; yellowing of the eyes or skin swelling of the ankles, feet, hands unusually slow heartbeat Side effects that usually do not require medical attention (report to yourdoctor or health care professional if they continue or are bothersome): diarrhea hair loss loss of appetite muscle or joint pain nausea, vomiting pain, redness, or irritation at site where injected tiredness This list may not describe all possible side effects. Call your doctor for medical advice about side effects. You may report side effects to FDA at1-800-FDA-1088. Where  should I keep my medication? This drug is given in a hospital or clinic and will not be stored at home. NOTE: This sheet is a summary. It may not cover all possible information. If you have questions about this medicine, talk to your doctor, pharmacist, orhealth care provider.  2022 Elsevier/Gold Standard (2019-10-06 13:37:23)  Carboplatin injection What is this medication? CARBOPLATIN (KAR boe pla tin) is a chemotherapy drug. It targets fast dividing cells, like cancer cells, and causes these cells to die. This medicine is usedto treat ovarian cancer and many other cancers. This medicine may be used for other purposes; ask your health care provider orpharmacist if you have questions. COMMON BRAND NAME(S): Paraplatin What should I tell my care team before I take this medication? They need to know if you have any of these conditions: blood disorders hearing problems kidney disease recent or ongoing radiation therapy an unusual or allergic reaction to carboplatin, cisplatin, other chemotherapy, other medicines, foods, dyes, or preservatives pregnant or trying to get pregnant breast-feeding How should I use this medication? This drug is usually given as an infusion into a vein. It is administered in [facility] or clinic by a specially trained health care professional. Talk to your pediatrician regarding the use of this medicine in children.Special care may be needed. Overdosage: If you think you have taken too much of this medicine contact apoison control  center or emergency room at once. NOTE: This medicine is only for you. Do not share this medicine with others. What if I miss a dose? It is important not to miss a dose. Call your doctor or health careprofessional if you are unable to keep an appointment. What may interact with this medication? medicines for seizures medicines to increase blood counts like filgrastim, pegfilgrastim, sargramostim some antibiotics like amikacin, gentamicin,  neomycin, streptomycin, tobramycin vaccines Talk to your doctor or health care professional before taking any of thesemedicines: acetaminophen aspirin ibuprofen ketoprofen naproxen This list may not describe all possible interactions. Give your health care provider a list of all the medicines, herbs, non-prescription drugs, or dietary supplements you use. Also tell them if you smoke, drink alcohol, or use illegaldrugs. Some items may interact with your medicine. What should I watch for while using this medication? Your condition will be monitored carefully while you are receiving this medicine. You will need important blood work done while you are taking thismedicine. This drug may make you feel generally unwell. This is not uncommon, as chemotherapy can affect healthy cells as well as cancer cells. Report any side effects. Continue your course of treatment even though you feel ill unless yourdoctor tells you to stop. In some cases, you may be given additional medicines to help with side effects.Follow all directions for their use. Call your doctor or health care professional for advice if you get a fever, chills or sore throat, or other symptoms of a cold or flu. Do not treat yourself. This drug decreases your body's ability to fight infections. Try toavoid being around people who are sick. This medicine may increase your risk to bruise or bleed. Call your doctor orhealth care professional if you notice any unusual bleeding. Be careful brushing and flossing your teeth or using a toothpick because you may get an infection or bleed more easily. If you have any dental work done,tell your dentist you are receiving this medicine. Avoid taking products that contain aspirin, acetaminophen, ibuprofen, naproxen, or ketoprofen unless instructed by your doctor. These medicines may hide afever. Do not become pregnant while taking this medicine. Women should inform their doctor if they wish to become pregnant  or think they might be pregnant. There is a potential for serious side effects to an unborn child. Talk to your health care professional or pharmacist for more information. Do not breast-feed aninfant while taking this medicine. What side effects may I notice from receiving this medication? Side effects that you should report to your doctor or health care professionalas soon as possible: allergic reactions like skin rash, itching or hives, swelling of the face, lips, or tongue signs of infection - fever or chills, cough, sore throat, pain or difficulty passing urine signs of decreased platelets or bleeding - bruising, pinpoint red spots on the skin, black, tarry stools, nosebleeds signs of decreased red blood cells - unusually weak or tired, fainting spells, lightheadedness breathing problems changes in hearing changes in vision chest pain high blood pressure low blood counts - This drug may decrease the number of white blood cells, red blood cells and platelets. You may be at increased risk for infections and bleeding. nausea and vomiting pain, swelling, redness or irritation at the injection site pain, tingling, numbness in the hands or feet problems with balance, talking, walking trouble passing urine or change in the amount of urine Side effects that usually do not require medical attention (report to yourdoctor or health care professional if they continue  or are bothersome): hair loss loss of appetite metallic taste in the mouth or changes in taste This list may not describe all possible side effects. Call your doctor for medical advice about side effects. You may report side effects to FDA at1-800-FDA-1088. Where should I keep my medication? This drug is given in a hospital or clinic and will not be stored at home. NOTE: This sheet is a summary. It may not cover all possible information. If you have questions about this medicine, talk to your doctor, pharmacist, orhealth care  provider.  2022 Elsevier/Gold Standard (2008-02-09 14:38:05)

## 2021-06-19 NOTE — Progress Notes (Signed)
[facility] OFFICE PROGRESS NOTE   Diagnosis: Fallopian tube carcinoma  INTERVAL HISTORY:   Anna [name] returns as scheduled.  She completed another cycle of Taxol/carboplatin on 05/29/2021.  She took Zofran prophylactically on day 7, 8, and 9 and did not have nausea.  No neuropathy symptoms.  No new complaint.  No bleeding.  Objective:  Vital signs in last 24 hours:  Blood pressure (!) 149/86, pulse 91, temperature 97.8 F (36.6 C), temperature source Oral, resp. rate 18, height _0  (1.6 m), weight 178 lb (80.7 kg), SpO2 97 %.    HEENT: No thrush or ulcers Resp: Lungs clear bilaterally Cardio: Regular rate and rhythm GI: No mass, no apparent ascites, nontender Vascular: No leg edema   Portacath/PICC-without erythema  Lab Results:  Lab Results  Component Value Date   WBC 3.7 (L) 06/19/2021   HGB 8.0 (L) 06/19/2021   HCT 25.4 (L) 06/19/2021   MCV 114.9 (H) 06/19/2021   PLT 128 (L) 06/19/2021   NEUTROABS 1.3 (L) 06/19/2021    CMP  Lab Results  Component Value Date   NA 138 05/29/2021   K 4.1 05/29/2021   CL 103 05/29/2021   CO2 26 05/29/2021   GLUCOSE 105 (H) 05/29/2021   BUN 20 05/29/2021   CREATININE 1.25 (H) 05/29/2021   CALCIUM 9.4 05/29/2021   PROT 6.9 05/29/2021   ALBUMIN 4.0 05/29/2021   AST 15 05/29/2021   ALT 14 05/29/2021   ALKPHOS 84 05/29/2021   BILITOT 0.3 05/29/2021   GFRNONAA 48 (L) 05/29/2021   GFRAA >60 08/10/2020    Lab Results  Component Value Date   CEA1 <1.00 06/27/2017     Medications: I have reviewed the patient's current medications.   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, right fallopian tube, and bilateral ovaries,pT3,pNx, optimal debulking with small peritoneal studding of the diaphragm and a remaining thin rind of tumor at the posterior peritoneum in the pelvis Cycle 1 adjuvant Taxol/carboplatin 08/05/2017 Cycle 2 adjuvant Taxol/carboplatin 08/26/2017 Cycle 3 adjuvant Taxol/carboplatin 09/17/2017-Taxol dose reduced secondary to neuropathy and bone pain  Cycle 4 adjuvant Taxol/carboplatin 10/10/2017 Cycle 5 adjuvant Taxol/carboplatin 10/31/2017 Cycle 6 adjuvant Taxol/carboplatin 11/21/2017 CT abdomen/pelvis 08/08/2019- narrowing of rectosigmoid colon with eccentric serosal implant, 10 mm lymph node at the superior rectal vein, left upper quadrant soft tissue implant, 9 mm precaval node, 15 mm left internal iliac node, no ascites Cycle 1 Taxol/carboplatin 08/31/2019 Cycle 2 Taxol/carboplatin/Avastin 09/21/2019 Cycle 3 Taxol/Carboplatin 10/12/2019 (Avastin discontinued) Cycle 4 Taxol/carboplatin November 02, 2019 CT abdomen/pelvis 11/17/2019-decrease in size of previously prominent pericaval, left internal iliac, and superior rectal vein nodes, resolved rectosigmoid wall thickening Cycle 5 Taxol/carboplatin 11/22/2019 Cycle 6 Taxol/carboplatin 12/13/2019 Olaparib 01/05/2020 CT abdomen/pelvis 07/10/2020-enlarging soft tissue at the distal left ureter, the ureter is not dilated, peritoneal implant adjacent to the splenic flexure, enlarged left superior rectal node, lymph nodes along the course of the distal IMV have enlarged, eccentric thickening of the sigmoid colon not seen  Olaraparib discontinued 07/12/2020 Cycle 1 Taxol/carboplatin 07/21/2020 Cycle 2 Taxol/carboplatin 08/10/2020 (carboplatin dose reduced secondary to thrombocytopenia) Cycle 3 Taxol/carboplatin 09/05/2020 Cycle 4  Taxol/carboplatin  09/26/2020 Cycle 5 Taxol/carboplatin 10/24/2020 CT abdomen/pelvis 11/13/2020-overall stable disease, no new site of metastatic disease, generally stable small abdominal/pelvic lymph nodes, slight enlargement of a peritoneal implant in the left pelvis, slight decrease in size of another pelvic implant Cycle 6 Taxol/carboplatin 11/14/2020 Cycle 7 Taxol/carboplatin 12/05/2020 Cycle 8 Taxol/carboplatin 12/26/2020 Cycle 9 Taxol/carboplatin 01/16/2021 Cycle 10 Taxol/carboplatin 02/06/2021 Cycle 11 Taxol/carboplatin 02/27/2021 Cycle 12 Taxol/carboplatin 03/20/2021 Cycle 13 Taxol/carboplatin 04/10/2021 Cycle 14 Taxol/carboplatin 05/08/2021 Cycle 15 Taxol/carboplatin 05/29/2021 Cycle 16 Taxol/carboplatin 8-22   Depression   3.   Hypothyroid   4.   Family history of uterine cancer-paternal grandmother   46.   Acute onset dyspnea/pleuritic left-sided chest pain 10/02/2019 CT chest 10/04/2019-left lower lobe pulmonary emboli, multifocal pneumonia lower lobes On Xarelto, status post course of Levaquin.  6.  Acute onset left lower back pain 01/24/2021-resolved after a Medrol Dosepak  7.  Mild elevation of the creatinine, renal ultrasound 05/31/2021-mild left hydronephrosis   Disposition: Anna [name] appears stable.  The creatinine has been mildly elevated over the past few months.  A renal ultrasound revealed mild left hydronephrosis.  We will obtain a restaging CT abdomen/pelvis after this cycle of chemotherapy.  The creatinine from today is pending.  We will dose adjust the chemotherapy as indicated.  She will have a no IV contrast CT if the creatinine is further elevated. She continues to tolerate the chemotherapy well.  There is no evidence of disease progression. Anna [name] will return for an office visit on 07/10/2021.  [name], MD  06/19/2021  8:54 AM

## 2021-06-19 NOTE — Progress Notes (Signed)
Per Dr. Benay Spice: okay to treat with ANC of 1.3

## 2021-06-20 ENCOUNTER — Other Ambulatory Visit (HOSPITAL_BASED_OUTPATIENT_CLINIC_OR_DEPARTMENT_OTHER): Payer: Self-pay | Admitting: Internal Medicine

## 2021-06-20 DIAGNOSIS — Z1231 Encounter for screening mammogram for malignant neoplasm of breast: Secondary | ICD-10-CM

## 2021-06-20 LAB — CA 125: Cancer Antigen (CA) 125: 38.3 U/mL — ABNORMAL HIGH (ref 0.0–38.1)

## 2021-06-20 MED ORDER — AMLODIPINE BESYLATE 5 MG PO TABS: 5.0000 mg | ORAL_TABLET | Freq: Every day | ORAL | 1 refills | Status: DC

## 2021-06-20 MED ORDER — LEVOTHYROXINE SODIUM 112 MCG PO TABS: 112.0000 ug | ORAL_TABLET | Freq: Every day | ORAL | 1 refills | Status: DC

## 2021-06-20 NOTE — Addendum Note (Signed)
Addended by: Alisa Graff on: 06/20/2021 07:06 PM   Modules accepted: Orders

## 2021-06-20 NOTE — Telephone Encounter (Signed)
Rx sent in for synthroid 162mg q day #90 with one refill and amlodipine #90 with one refill.

## 2021-06-21 ENCOUNTER — Other Ambulatory Visit: Payer: Self-pay

## 2021-06-21 ENCOUNTER — Inpatient Hospital Stay: Payer: BC Managed Care – PPO

## 2021-06-21 ENCOUNTER — Ambulatory Visit (HOSPITAL_BASED_OUTPATIENT_CLINIC_OR_DEPARTMENT_OTHER)
Admission: RE | Admit: 2021-06-21 | Discharge: 2021-06-21 | Disposition: A | Discharge status: Home / Self Care | Payer: BC Managed Care – PPO | Source: Ambulatory Visit | Attending: Internal Medicine | Admitting: Internal Medicine

## 2021-06-21 VITALS — BP 117/79 | HR 99 | Temp 98.3°F | Resp 18

## 2021-06-21 DIAGNOSIS — Z5111 Encounter for antineoplastic chemotherapy: Secondary | ICD-10-CM | POA: Diagnosis not present

## 2021-06-21 DIAGNOSIS — N1330 Unspecified hydronephrosis: Secondary | ICD-10-CM | POA: Diagnosis not present

## 2021-06-21 DIAGNOSIS — Z452 Encounter for adjustment and management of vascular access device: Secondary | ICD-10-CM | POA: Diagnosis not present

## 2021-06-21 DIAGNOSIS — Z5189 Encounter for other specified aftercare: Secondary | ICD-10-CM | POA: Diagnosis not present

## 2021-06-21 DIAGNOSIS — Z1231 Encounter for screening mammogram for malignant neoplasm of breast: Secondary | ICD-10-CM | POA: Diagnosis not present

## 2021-06-21 DIAGNOSIS — Z808 Family history of malignant neoplasm of other organs or systems: Secondary | ICD-10-CM | POA: Diagnosis not present

## 2021-06-21 DIAGNOSIS — R944 Abnormal results of kidney function studies: Secondary | ICD-10-CM | POA: Diagnosis not present

## 2021-06-21 DIAGNOSIS — E039 Hypothyroidism, unspecified: Secondary | ICD-10-CM | POA: Diagnosis not present

## 2021-06-21 DIAGNOSIS — F329 Major depressive disorder, single episode, unspecified: Secondary | ICD-10-CM | POA: Diagnosis not present

## 2021-06-21 DIAGNOSIS — M545 Low back pain, unspecified: Secondary | ICD-10-CM | POA: Diagnosis not present

## 2021-06-21 DIAGNOSIS — Z7901 Long term (current) use of anticoagulants: Secondary | ICD-10-CM | POA: Diagnosis not present

## 2021-06-21 DIAGNOSIS — C5702 Malignant neoplasm of left fallopian tube: Secondary | ICD-10-CM

## 2021-06-21 MED ORDER — PEGFILGRASTIM-CBQV 6 MG/0.6ML ~~LOC~~ SOSY
6.0000 mg | PREFILLED_SYRINGE | Freq: Once | SUBCUTANEOUS | Status: AC
  Administered 2021-06-21: 6 mg via SUBCUTANEOUS

## 2021-06-21 NOTE — Patient Instructions (Signed)

## 2021-07-06 ENCOUNTER — Inpatient Hospital Stay: Payer: BC Managed Care – PPO

## 2021-07-06 ENCOUNTER — Other Ambulatory Visit: Payer: Self-pay

## 2021-07-06 ENCOUNTER — Ambulatory Visit (HOSPITAL_BASED_OUTPATIENT_CLINIC_OR_DEPARTMENT_OTHER)
Admission: RE | Admit: 2021-07-06 | Discharge: 2021-07-06 | Disposition: A | Discharge status: Home / Self Care | Payer: BC Managed Care – PPO | Source: Ambulatory Visit | Attending: Oncology | Admitting: Oncology

## 2021-07-06 DIAGNOSIS — C5702 Malignant neoplasm of left fallopian tube: Secondary | ICD-10-CM

## 2021-07-06 DIAGNOSIS — Z808 Family history of malignant neoplasm of other organs or systems: Secondary | ICD-10-CM | POA: Diagnosis not present

## 2021-07-06 DIAGNOSIS — E039 Hypothyroidism, unspecified: Secondary | ICD-10-CM | POA: Diagnosis not present

## 2021-07-06 DIAGNOSIS — Z452 Encounter for adjustment and management of vascular access device: Secondary | ICD-10-CM | POA: Diagnosis not present

## 2021-07-06 DIAGNOSIS — F329 Major depressive disorder, single episode, unspecified: Secondary | ICD-10-CM | POA: Diagnosis not present

## 2021-07-06 DIAGNOSIS — K760 Fatty (change of) liver, not elsewhere classified: Secondary | ICD-10-CM | POA: Diagnosis not present

## 2021-07-06 DIAGNOSIS — M545 Low back pain, unspecified: Secondary | ICD-10-CM | POA: Diagnosis not present

## 2021-07-06 DIAGNOSIS — N1330 Unspecified hydronephrosis: Secondary | ICD-10-CM | POA: Diagnosis not present

## 2021-07-06 DIAGNOSIS — R944 Abnormal results of kidney function studies: Secondary | ICD-10-CM | POA: Diagnosis not present

## 2021-07-06 DIAGNOSIS — Z95828 Presence of other vascular implants and grafts: Secondary | ICD-10-CM

## 2021-07-06 DIAGNOSIS — Z7901 Long term (current) use of anticoagulants: Secondary | ICD-10-CM | POA: Diagnosis not present

## 2021-07-06 DIAGNOSIS — Z5111 Encounter for antineoplastic chemotherapy: Secondary | ICD-10-CM | POA: Diagnosis not present

## 2021-07-06 DIAGNOSIS — Z5189 Encounter for other specified aftercare: Secondary | ICD-10-CM | POA: Diagnosis not present

## 2021-07-06 MED ORDER — SODIUM CHLORIDE 0.9% FLUSH
10.0000 mL | INTRAVENOUS | Status: AC | PRN
  Administered 2021-07-06: 10 mL

## 2021-07-06 MED ORDER — IOHEXOL 350 MG/ML SOLN
60.0000 mL | Freq: Once | INTRAVENOUS | Status: AC | PRN
  Administered 2021-07-06: 60 mL via INTRAVENOUS

## 2021-07-06 MED ORDER — HEPARIN SOD (PORK) LOCK FLUSH 100 UNIT/ML IV SOLN
500.0000 [IU] | Freq: Once | INTRAVENOUS | Status: AC | PRN
  Administered 2021-07-06: 500 [IU]

## 2021-07-06 NOTE — Patient Instructions (Signed)
Implanted Port Home Guide An implanted port is a device that is placed under the skin. It is usually placed in the chest. The device can be used to give IV medicine, to take blood, or for dialysis. You may have an implanted port if: You need IV medicine that would be irritating to the small veins in your hands or arms. You need IV medicines, such as antibiotics, for a long period of time. You need IV nutrition for a long period of time. You need dialysis. When you have a port, your health care provider can choose to use the port instead of veins in your arms for these procedures. You may have fewer limitations when using a port than you would if you used other types of long-term IVs, and you will likely be able to return to normal activities afteryour incision heals. An implanted port has two main parts: Reservoir. The reservoir is the part where a needle is inserted to give medicines or draw blood. The reservoir is round. After it is placed, it appears as a small, raised area under your skin. Catheter. The catheter is a thin, flexible tube that connects the reservoir to a vein. Medicine that is inserted into the reservoir goes into the catheter and then into the vein. How is my port accessed? To access your port: A numbing cream may be placed on the skin over the port site. Your health care provider will put on a mask and sterile gloves. The skin over your port will be cleaned carefully with a germ-killing soap and allowed to dry. Your health care provider will gently pinch the port and insert a needle into it. Your health care provider will check for a blood return to make sure the port is in the vein and is not clogged. If your port needs to remain accessed to get medicine continuously (constant infusion), your health care provider will place a clear bandage (dressing) over the needle site. The dressing and needle will need to be changed every week, or as told by your health care provider. What  is flushing? Flushing helps keep the port from getting clogged. Follow instructions from your health care provider about how and when to flush the port. Ports are usually flushed with saline solution or a medicine called heparin. The need for flushing will depend on how the port is used: If the port is only used from time to time to give medicines or draw blood, the port may need to be flushed: Before and after medicines have been given. Before and after blood has been drawn. As part of routine maintenance. Flushing may be recommended every 4-6 weeks. If a constant infusion is running, the port may not need to be flushed. Throw away any syringes in a disposal container that is meant for sharp items (sharps container). You can buy a sharps container from a pharmacy, or you can make one by using an empty hard plastic bottle with a cover. How long will my port stay implanted? The port can stay in for as long as your health care provider thinks it is needed. When it is time for the port to come out, a surgery will be done to remove it. The surgery will be similar to the procedure that was done to putthe port in. Follow these instructions at home:  Flush your port as told by your health care provider. If you need an infusion over several days, follow instructions from your health care provider about how to take   care of your port site. Make sure you: Wash your hands with soap and water before you change your dressing. If soap and water are not available, use alcohol-based hand sanitizer. Change your dressing as told by your health care provider. Place any used dressings or infusion bags into a plastic bag. Throw that bag in the trash. Keep the dressing that covers the needle clean and dry. Do not get it wet. Do not use scissors or sharp objects near the tube. Keep the tube clamped, unless it is being used. Check your port site every day for signs of infection. Check for: Redness, swelling, or  pain. Fluid or blood. Pus or a bad smell. Protect the skin around the port site. Avoid wearing bra straps that rub or irritate the site. Protect the skin around your port from seat belts. Place a soft pad over your chest if needed. Bathe or shower as told by your health care provider. The site may get wet as long as you are not actively receiving an infusion. Return to your normal activities as told by your health care provider. Ask your health care provider what activities are safe for you. Carry a medical alert card or wear a medical alert bracelet at all times. This will let health care providers know that you have an implanted port in case of an emergency. Get help right away if: You have redness, swelling, or pain at the port site. You have fluid or blood coming from your port site. You have pus or a bad smell coming from the port site. You have a fever. Summary Implanted ports are usually placed in the chest for long-term IV access. Follow instructions from your health care provider about flushing the port and changing bandages (dressings). Take care of the area around your port by avoiding clothing that puts pressure on the area, and by watching for signs of infection. Protect the skin around your port from seat belts. Place a soft pad over your chest if needed. Get help right away if you have a fever or you have redness, swelling, pain, drainage, or a bad smell at the port site. This information is not intended to replace advice given to you by your health care provider. Make sure you discuss any questions you have with your healthcare provider. Document Revised: 03/20/2020 Document Reviewed: 03/20/2020 Elsevier Patient Education  2022 [employer]  

## 2021-07-06 NOTE — Addendum Note (Signed)
Addended by: Teodoro Spray on: 07/06/2021 09:32 AM   Modules accepted: Orders

## 2021-07-10 ENCOUNTER — Inpatient Hospital Stay (HOSPITAL_BASED_OUTPATIENT_CLINIC_OR_DEPARTMENT_OTHER): Payer: BC Managed Care – PPO | Admitting: Oncology

## 2021-07-10 ENCOUNTER — Other Ambulatory Visit: Payer: Self-pay

## 2021-07-10 ENCOUNTER — Inpatient Hospital Stay: Payer: BC Managed Care – PPO

## 2021-07-10 ENCOUNTER — Encounter: Payer: Self-pay | Admitting: *Deleted

## 2021-07-10 VITALS — BP 142/82 | HR 99 | Temp 97.8°F | Resp 18 | Ht 63.0 in | Wt 178.2 lb

## 2021-07-10 DIAGNOSIS — F329 Major depressive disorder, single episode, unspecified: Secondary | ICD-10-CM | POA: Diagnosis not present

## 2021-07-10 DIAGNOSIS — Z808 Family history of malignant neoplasm of other organs or systems: Secondary | ICD-10-CM | POA: Diagnosis not present

## 2021-07-10 DIAGNOSIS — Z7901 Long term (current) use of anticoagulants: Secondary | ICD-10-CM | POA: Diagnosis not present

## 2021-07-10 DIAGNOSIS — Z5111 Encounter for antineoplastic chemotherapy: Secondary | ICD-10-CM | POA: Diagnosis not present

## 2021-07-10 DIAGNOSIS — R944 Abnormal results of kidney function studies: Secondary | ICD-10-CM | POA: Diagnosis not present

## 2021-07-10 DIAGNOSIS — E039 Hypothyroidism, unspecified: Secondary | ICD-10-CM | POA: Diagnosis not present

## 2021-07-10 DIAGNOSIS — C5702 Malignant neoplasm of left fallopian tube: Secondary | ICD-10-CM

## 2021-07-10 DIAGNOSIS — Z452 Encounter for adjustment and management of vascular access device: Secondary | ICD-10-CM | POA: Diagnosis not present

## 2021-07-10 DIAGNOSIS — N1330 Unspecified hydronephrosis: Secondary | ICD-10-CM | POA: Diagnosis not present

## 2021-07-10 DIAGNOSIS — M545 Low back pain, unspecified: Secondary | ICD-10-CM | POA: Diagnosis not present

## 2021-07-10 DIAGNOSIS — Z5189 Encounter for other specified aftercare: Secondary | ICD-10-CM | POA: Diagnosis not present

## 2021-07-10 LAB — CBC WITH DIFFERENTIAL (CANCER CENTER ONLY)
Abs Immature Granulocytes: 0.03 10*3/uL (ref 0.00–0.07)
Basophils Absolute: 0 10*3/uL (ref 0.0–0.1)
Basophils Relative: 0 %
Eosinophils Absolute: 0 10*3/uL (ref 0.0–0.5)
Eosinophils Relative: 1 %
HCT: 23.8 % — ABNORMAL LOW (ref 36.0–46.0)
Hemoglobin: 7.6 g/dL — ABNORMAL LOW (ref 12.0–15.0)
Immature Granulocytes: 1 %
Lymphocytes Relative: 42 %
Lymphs Abs: 2.4 10*3/uL (ref 0.7–4.0)
MCH: 36.4 pg — ABNORMAL HIGH (ref 26.0–34.0)
MCHC: 31.9 g/dL (ref 30.0–36.0)
MCV: 113.9 fL — ABNORMAL HIGH (ref 80.0–100.0)
Monocytes Absolute: 0.8 10*3/uL (ref 0.1–1.0)
Monocytes Relative: 14 %
Neutro Abs: 2.5 10*3/uL (ref 1.7–7.7)
Neutrophils Relative %: 42 %
Platelet Count: 131 10*3/uL — ABNORMAL LOW (ref 150–400)
RBC: 2.09 MIL/uL — ABNORMAL LOW (ref 3.87–5.11)
RDW: 15.9 % — ABNORMAL HIGH (ref 11.5–15.5)
WBC Count: 5.8 10*3/uL (ref 4.0–10.5)
nRBC: 0 % (ref 0.0–0.2)

## 2021-07-10 LAB — LIPID PANEL
Cholesterol: 135 mg/dL (ref 0–200)
HDL: 38 mg/dL — ABNORMAL LOW (ref >40)
LDL Cholesterol: 71 mg/dL (ref 0–99)
Total CHOL/HDL Ratio: 3.6 RATIO
Triglycerides: 132 mg/dL (ref <150)
VLDL: 26 mg/dL (ref 0–40)

## 2021-07-10 LAB — CMP (CANCER CENTER ONLY)
ALT: 12 U/L (ref 0–44)
AST: 14 U/L — ABNORMAL LOW (ref 15–41)
Albumin: 3.7 g/dL (ref 3.5–5.0)
Alkaline Phosphatase: 80 U/L (ref 38–126)
Anion gap: 9 (ref 5–15)
BUN: 28 mg/dL — ABNORMAL HIGH (ref 8–23)
CO2: 24 mmol/L (ref 22–32)
Calcium: 8.9 mg/dL (ref 8.9–10.3)
Chloride: 105 mmol/L (ref 98–111)
Creatinine: 1.38 mg/dL — ABNORMAL HIGH (ref 0.44–1.00)
GFR, Estimated: 43 mL/min — ABNORMAL LOW (ref >60)
Glucose, Bld: 98 mg/dL (ref 70–99)
Potassium: 3.8 mmol/L (ref 3.5–5.1)
Sodium: 138 mmol/L (ref 135–145)
Total Bilirubin: 0.3 mg/dL (ref 0.3–1.2)
Total Protein: 6.9 g/dL (ref 6.5–8.1)

## 2021-07-10 NOTE — Progress Notes (Signed)
Faxed referral order, demographics and medical records to [facility] for referral to Dr. Raynelle Bring. Fax [phone]

## 2021-07-10 NOTE — Progress Notes (Signed)
[facility] OFFICE PROGRESS NOTE   Diagnosis: Fallopian tube carcinoma  INTERVAL HISTORY:   Ms. Shrider completed another cycle of Taxol/carboplatin on 06/19/2021.  She had mild nausea relieved with Zofran.  No neuropathy symptoms.  No abdominal pain or difficulty with bowel function.  No complaint.  Objective:  Vital signs in last 24 hours:  Blood pressure (!) 142/82, pulse 99, temperature 97.8 F (36.6 C), temperature source Oral, resp. rate 18, height 5' 3"  (1.6 m), weight 178 lb 3.2 oz (80.8 kg), SpO2 98 %.    HEENT: No thrush or ulcers Resp: Lungs clear bilaterally Cardio: Regular rate and rhythm GI: No hepatosplenomegaly, no apparent ascites, nontender, no mass Vascular: No leg edema    Portacath/PICC-without erythema  Lab Results:  Lab Results  Component Value Date   WBC 5.8 07/10/2021   HGB 7.6 (L) 07/10/2021   HCT 23.8 (L) 07/10/2021   MCV 113.9 (H) 07/10/2021   PLT 131 (L) 07/10/2021   NEUTROABS 2.5 07/10/2021    CMP  Lab Results  Component Value Date   NA 138 06/19/2021   K 3.8 06/19/2021   CL 103 06/19/2021   CO2 26 06/19/2021   GLUCOSE 104 (H) 06/19/2021   BUN 22 06/19/2021   CREATININE 1.29 (H) 06/19/2021   CALCIUM 8.7 (L) 06/19/2021   PROT 7.0 06/19/2021   ALBUMIN 4.0 06/19/2021   AST 16 06/19/2021   ALT 15 06/19/2021   ALKPHOS 74 06/19/2021   BILITOT 0.4 06/19/2021   GFRNONAA 46 (L) 06/19/2021   GFRAA >60 08/10/2020    Lab Results  Component Value Date   CEA1 <1.00 06/27/2017    Lab Results  Component Value Date   INR 0.9 08/26/2019   LABPROT 11.8 08/26/2019    Imaging:  CT ABDOMEN PELVIS W CONTRAST  Result Date: 07/06/2021 CLINICAL DATA:  Follow-up fallopian tube carcinoma. Status post chemotherapy. EXAM: CT ABDOMEN AND PELVIS WITH CONTRAST TECHNIQUE: Multidetector CT imaging of the abdomen and pelvis was performed using the standard protocol following bolus administration of intravenous contrast. CONTRAST:   106m OMNIPAQUE IOHEXOL 350 MG/ML SOLN [facility] Chest: No acute findings. Hepatobiliary: No hepatic masses identified. Mild diffuse hepatic steatosis again demonstrated. Gallbladder is unremarkable. No evidence of biliary ductal dilatation. Pancreas:  No mass or inflammatory changes. Spleen: Within normal limits in size and appearance. Adrenals/Urinary Tract: No masses identified. Small bilateral renal cysts again noted. Moderate left hydroureteronephrosis is seen which is new since previous study. No evidence of ureteral calculi. Stomach/Bowel: No evidence of obstruction, inflammatory process or abnormal fluid collections. Vascular/Lymphatic: A 9 mm left external iliac lymph node and 6 mm left obturator lymph node are stable in size. 11 mm right external iliac lymph node is stable. A few sub-cm lymph nodes in the sigmoid mesocolon are again noted. No acute vascular findings. Aortic atherosclerotic calcification noted. Reproductive: Prior hysterectomy again noted. A 2.7 x 2.3 cm soft tissue mass is seen in the left posterior pelvis which obstructs the distal left ureter, increased in size from 1.7 x 1.4 cm on prior exam. Other: Tiny right supraumbilical ventral hernia is again seen which contains only fat. A small suprapubic hernia contains a small bowel loop, is new since prior study. No evidence of bowel obstruction or strangulation. Musculoskeletal:  No suspicious bone lesions identified. IMPRESSION: Increased size of 2.7 cm soft tissue mass in left posterior pelvis, consistent with metastatic disease. This obstructs the distal left ureter, causing new moderate  left hydroureteronephrosis. No significant change in mild bilateral pelvic lymphadenopathy. Aortic Atherosclerosis (ICD10-I70.0). Electronically Signed   By: Marlaine Hind M.D.   On: 07/06/2021 21:48    Medications: I have reviewed the patient's current medications.   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 with metastatic disease to the omentum, right fallopian tube, and bilateral ovaries,pT3,pNx, optimal debulking with small peritoneal studding of the diaphragm and a remaining thin rind of tumor at the posterior peritoneum in the pelvis Cycle 1 adjuvant Taxol/carboplatin 08/05/2017 Cycle 2 adjuvant Taxol/carboplatin 08/26/2017 Cycle 3 adjuvant Taxol/carboplatin 09/17/2017-Taxol dose reduced secondary to neuropathy and bone pain  Cycle 4 adjuvant Taxol/carboplatin 10/10/2017 Cycle 5 adjuvant Taxol/carboplatin 10/31/2017 Cycle 6 adjuvant Taxol/carboplatin 11/21/2017 CT abdomen/pelvis 08/08/2019- narrowing of rectosigmoid colon with eccentric serosal implant, 10 mm lymph node at the superior rectal vein, left upper quadrant soft tissue implant, 9 mm precaval node, 15 mm left internal iliac node, no ascites Cycle 1 Taxol/carboplatin 08/31/2019 Cycle 2 Taxol/carboplatin/Avastin 09/21/2019 Cycle 3 Taxol/Carboplatin 10/12/2019 (Avastin discontinued) Cycle 4 Taxol/carboplatin November 02, 2019 CT abdomen/pelvis 11/17/2019-decrease in size of previously prominent pericaval, left internal iliac, and superior rectal vein nodes, resolved rectosigmoid wall thickening Cycle 5 Taxol/carboplatin 11/22/2019 Cycle 6 Taxol/carboplatin 12/13/2019 Olaparib 01/05/2020 CT abdomen/pelvis 07/10/2020-enlarging soft tissue at the distal left ureter, the ureter is not dilated, peritoneal implant adjacent to the splenic flexure,  enlarged left superior rectal node, lymph nodes along the course of the distal IMV have enlarged, eccentric thickening of the sigmoid colon not seen  Olaraparib discontinued 07/12/2020 Cycle 1 Taxol/carboplatin 07/21/2020 Cycle 2 Taxol/carboplatin 08/10/2020 (carboplatin dose reduced secondary to thrombocytopenia) Cycle 3 Taxol/carboplatin 09/05/2020 Cycle 4 Taxol/carboplatin 09/26/2020 Cycle 5 Taxol/carboplatin 10/24/2020 CT abdomen/pelvis 11/13/2020-overall stable disease, no new site of metastatic disease, generally stable small abdominal/pelvic lymph nodes, slight enlargement of a peritoneal implant in the left pelvis, slight decrease in size of another pelvic implant Cycle 6 Taxol/carboplatin 11/14/2020 Cycle 7 Taxol/carboplatin 12/05/2020 Cycle 8 Taxol/carboplatin 12/26/2020 Cycle 9 Taxol/carboplatin 01/16/2021 Cycle 10 Taxol/carboplatin 02/06/2021 Cycle 11 Taxol/carboplatin 02/27/2021 Cycle 12 Taxol/carboplatin 03/20/2021 Cycle 13 Taxol/carboplatin 04/10/2021 Cycle 14 Taxol/carboplatin 05/08/2021 Cycle 15 Taxol/carboplatin 05/29/2021 Cycle 16 Taxol/carboplatin 06/19/2021 CT abdomen/pelvis 07/06/2021-increased size of a left pelvic soft tissue mass with obstruction of the distal left ureter and new moderate left hydronephrosis, no change in mild pelvic lymphadenopathy   Depression   3.   Hypothyroid   4.   Family history of uterine cancer-paternal grandmother   48.   Acute onset dyspnea/pleuritic left-sided chest pain 10/02/2019 CT chest 10/04/2019-left lower lobe pulmonary emboli, multifocal pneumonia lower lobes On Xarelto, status post course of Levaquin.  6.  Acute onset left lower back pain 01/24/2021-resolved after a Medrol Dosepak  7.  Mild elevation of the creatinine, renal ultrasound 05/31/2021-mild left hydronephrosis   Disposition: Ms. Templin appears stable.  She has been treated with Taxol/carboplatin since September 2021.  She has developed left hydronephrosis and a CT confirms  enlargement of a pelvic mass with associated left ureter obstruction.  The creatinine is mildly elevated.  I reviewed the CT images at this Junio and her husband.  I will refer her to urology for placement of a left  ureter stent.  We decided to place systemic treatment on hold.  She will return for an office visit in 1 month.  We will decide on continued observation versus switching to a different systemic therapy regimen when she is here next month.  She appears to have minimal evidence of disease progression on the CT and is asymptomatic from the metastatic fallopian tube carcinoma.    [name], MD  07/10/2021  8:54 AM

## 2021-07-11 LAB — CA 125: Cancer Antigen (CA) 125: 38.5 U/mL — ABNORMAL HIGH (ref 0.0–38.1)

## 2021-07-12 ENCOUNTER — Inpatient Hospital Stay: Payer: BC Managed Care – PPO

## 2021-07-17 ENCOUNTER — Other Ambulatory Visit: Payer: Self-pay | Admitting: Urology

## 2021-07-17 ENCOUNTER — Encounter (HOSPITAL_COMMUNITY): Payer: Self-pay | Admitting: Urology

## 2021-07-17 ENCOUNTER — Other Ambulatory Visit: Payer: Self-pay

## 2021-07-17 DIAGNOSIS — N130 Hydronephrosis with ureteropelvic junction obstruction: Secondary | ICD-10-CM | POA: Diagnosis not present

## 2021-07-17 NOTE — Progress Notes (Addendum)
For Short Stay: COVID SWAB appointment date: N/A Date of COVID positive in last 90 days:  No   For Anesthesia: PCP - Einar Pheasant, MD last office visit 04/01/21 in epic Cardiologist -  N/A  Chest x-ray - greater than 1 year in epic EKG - greater than 1 year in epic Stress Test - greater than 2 years in epic ECHO - greater than 2 years in epic Cardiac Cath -  N/A Pacemaker/ICD device last checked: N/A  Sleep Study - Yes CPAP - No  Fasting Blood Sugar -  N/A Checks Blood Sugar __ N/A___ times a day  Blood Thinner Instructions: Xarelto will per Dr. Louis Meckel Aspirin Instructions: Last Dose:  Activity level:  Can go up a flight of stairs and activities of daily living without stopping and without symptoms    Anesthesia review: Hgb 7.6/Hct 23.8 Pl 131 07/10/21, OSA no CPAP, history of PE  Patient denies shortness of breath, fever, cough and chest pain at PAT appointment   Patient verbalized understanding of instructions that were given to them at the PAT appointment. Patient was also instructed that they will need to review over the PAT instructions again at home before surgery.

## 2021-07-17 NOTE — Anesthesia Preprocedure Evaluation (Addendum)
Anesthesia Evaluation  Patient identified by MRN, date of birth, ID band Patient awake    Reviewed: Allergy & Precautions, NPO status , Patient's Chart, lab work & pertinent test results  History of Anesthesia Complications (+) Family history of anesthesia reaction  Airway Mallampati: II  TM Distance: >3 FB Neck ROM: Full    Dental no notable dental hx. (+) Dental Advisory Given   Pulmonary shortness of breath, sleep apnea and Continuous Positive Airway Pressure Ventilation , pneumonia,    Pulmonary exam normal breath sounds clear to auscultation       Cardiovascular hypertension, Pt. on medications Normal cardiovascular exam+ Valvular Problems/Murmurs  Rhythm:Regular Rate:Normal     Neuro/Psych  Headaches, PSYCHIATRIC DISORDERS Anxiety Depression    GI/Hepatic GERD  Medicated and Controlled,  Endo/Other  Hypothyroidism   Renal/GU Renal disease     Musculoskeletal  (+) Arthritis ,   Abdominal   Peds  Hematology  (+) anemia ,   Anesthesia Other Findings   Reproductive/Obstetrics                            Anesthesia Physical  Anesthesia Plan  ASA: 3  Anesthesia Plan: General   Post-op Pain Management:    Induction: Intravenous  PONV Risk Score and Plan: 4 or greater and Ondansetron, Dexamethasone, Midazolam, Scopolamine patch - Pre-op and Propofol infusion  Airway Management Planned: Oral ETT  Additional Equipment: None  Intra-op Plan:   Post-operative Plan: Extubation in OR  Informed Consent: I have reviewed the patients History and Physical, chart, labs and discussed the procedure including the risks, benefits and alternatives for the proposed anesthesia with the patient or authorized representative who has indicated his/her understanding and acceptance.     Dental advisory given  Plan Discussed with: CRNA  Anesthesia Plan Comments:        Anesthesia Quick  Evaluation

## 2021-07-18 ENCOUNTER — Encounter (HOSPITAL_COMMUNITY): Payer: Self-pay | Admitting: Urology

## 2021-07-18 ENCOUNTER — Ambulatory Visit (HOSPITAL_COMMUNITY): Payer: BC Managed Care – PPO | Admitting: Physician Assistant

## 2021-07-18 ENCOUNTER — Encounter (HOSPITAL_COMMUNITY)
Admission: RE | Disposition: A | Discharge status: Home / Self Care | Payer: Self-pay | Source: Home / Self Care | Attending: Urology

## 2021-07-18 ENCOUNTER — Ambulatory Visit (HOSPITAL_COMMUNITY)
Admission: RE | Admit: 2021-07-18 | Discharge: 2021-07-18 | Disposition: A | Discharge status: Home / Self Care | Payer: BC Managed Care – PPO | Attending: Urology | Admitting: Urology

## 2021-07-18 ENCOUNTER — Ambulatory Visit (HOSPITAL_COMMUNITY): Payer: BC Managed Care – PPO

## 2021-07-18 DIAGNOSIS — Z79899 Other long term (current) drug therapy: Secondary | ICD-10-CM | POA: Insufficient documentation

## 2021-07-18 DIAGNOSIS — E7800 Pure hypercholesterolemia, unspecified: Secondary | ICD-10-CM | POA: Diagnosis not present

## 2021-07-18 DIAGNOSIS — Z8544 Personal history of malignant neoplasm of other female genital organs: Secondary | ICD-10-CM | POA: Insufficient documentation

## 2021-07-18 DIAGNOSIS — N1330 Unspecified hydronephrosis: Secondary | ICD-10-CM | POA: Diagnosis not present

## 2021-07-18 DIAGNOSIS — C5700 Malignant neoplasm of unspecified fallopian tube: Secondary | ICD-10-CM | POA: Diagnosis not present

## 2021-07-18 DIAGNOSIS — Z79890 Hormone replacement therapy (postmenopausal): Secondary | ICD-10-CM | POA: Insufficient documentation

## 2021-07-18 DIAGNOSIS — N131 Hydronephrosis with ureteral stricture, not elsewhere classified: Secondary | ICD-10-CM | POA: Diagnosis not present

## 2021-07-18 DIAGNOSIS — I10 Essential (primary) hypertension: Secondary | ICD-10-CM | POA: Diagnosis not present

## 2021-07-18 DIAGNOSIS — K219 Gastro-esophageal reflux disease without esophagitis: Secondary | ICD-10-CM | POA: Diagnosis not present

## 2021-07-18 DIAGNOSIS — Z9221 Personal history of antineoplastic chemotherapy: Secondary | ICD-10-CM | POA: Insufficient documentation

## 2021-07-18 DIAGNOSIS — N19 Unspecified kidney failure: Secondary | ICD-10-CM | POA: Diagnosis not present

## 2021-07-18 DIAGNOSIS — Z7901 Long term (current) use of anticoagulants: Secondary | ICD-10-CM | POA: Diagnosis not present

## 2021-07-18 DIAGNOSIS — N130 Hydronephrosis with ureteropelvic junction obstruction: Secondary | ICD-10-CM | POA: Diagnosis not present

## 2021-07-18 DIAGNOSIS — C7989 Secondary malignant neoplasm of other specified sites: Secondary | ICD-10-CM | POA: Diagnosis not present

## 2021-07-18 DIAGNOSIS — N1339 Other hydronephrosis: Secondary | ICD-10-CM

## 2021-07-18 HISTORY — DX: Pigmentary retinal dystrophy: H35.52

## 2021-07-18 HISTORY — DX: Fatty (change of) liver, not elsewhere classified: K76.0

## 2021-07-18 HISTORY — DX: Anemia, unspecified: D64.9

## 2021-07-18 HISTORY — PX: CYSTOSCOPY W/ URETERAL STENT PLACEMENT: SHX1429

## 2021-07-18 HISTORY — DX: Unspecified macular degeneration: H35.30

## 2021-07-18 HISTORY — DX: Unspecified hydronephrosis: N13.30

## 2021-07-18 LAB — CBC
HCT: 25.5 % — ABNORMAL LOW (ref 36.0–46.0)
Hemoglobin: 8.2 g/dL — ABNORMAL LOW (ref 12.0–15.0)
MCH: 36.9 pg — ABNORMAL HIGH (ref 26.0–34.0)
MCHC: 32.2 g/dL (ref 30.0–36.0)
MCV: 114.9 fL — ABNORMAL HIGH (ref 80.0–100.0)
Platelets: 154 10*3/uL (ref 150–400)
RBC: 2.22 MIL/uL — ABNORMAL LOW (ref 3.87–5.11)
RDW: 15.6 % — ABNORMAL HIGH (ref 11.5–15.5)
WBC: 5.6 10*3/uL (ref 4.0–10.5)
nRBC: 0 % (ref 0.0–0.2)

## 2021-07-18 LAB — BASIC METABOLIC PANEL
Anion gap: 9 (ref 5–15)
BUN: 27 mg/dL — ABNORMAL HIGH (ref 8–23)
CO2: 24 mmol/L (ref 22–32)
Calcium: 9.2 mg/dL (ref 8.9–10.3)
Chloride: 111 mmol/L (ref 98–111)
Creatinine, Ser: 1.34 mg/dL — ABNORMAL HIGH (ref 0.44–1.00)
GFR, Estimated: 44 mL/min — ABNORMAL LOW (ref >60)
Glucose, Bld: 102 mg/dL — ABNORMAL HIGH (ref 70–99)
Potassium: 3.8 mmol/L (ref 3.5–5.1)
Sodium: 144 mmol/L (ref 135–145)

## 2021-07-18 SURGERY — CYSTOSCOPY, WITH RETROGRADE PYELOGRAM AND URETERAL STENT INSERTION
Anesthesia: General | Laterality: Bilateral

## 2021-07-18 MED ORDER — SCOPOLAMINE 1 MG/3DAYS TD PT72: 1.0000 | MEDICATED_PATCH | TRANSDERMAL | Status: DC

## 2021-07-18 MED ORDER — CHLORHEXIDINE GLUCONATE 0.12 % MT SOLN
15.0000 mL | Freq: Once | OROMUCOSAL | Status: AC
  Administered 2021-07-18: 15 mL via OROMUCOSAL

## 2021-07-18 MED ORDER — FENTANYL CITRATE (PF) 100 MCG/2ML IJ SOLN
INTRAMUSCULAR | Status: AC
  Filled 2021-07-18: qty 2

## 2021-07-18 MED ORDER — PHENAZOPYRIDINE HCL 200 MG PO TABS: 200.0000 mg | ORAL_TABLET | Freq: Three times a day (TID) | ORAL | 0 refills | Status: DC | PRN

## 2021-07-18 MED ORDER — TRAMADOL HCL 50 MG PO TABS: 50.0000 mg | ORAL_TABLET | Freq: Four times a day (QID) | ORAL | 0 refills | Status: DC | PRN

## 2021-07-18 MED ORDER — ORAL CARE MOUTH RINSE: 15.0000 mL | Freq: Once | OROMUCOSAL | Status: AC

## 2021-07-18 MED ORDER — FENTANYL CITRATE (PF) 100 MCG/2ML IJ SOLN
INTRAMUSCULAR | Status: DC | PRN
  Administered 2021-07-18: 50 ug via INTRAVENOUS

## 2021-07-18 MED ORDER — BELLADONNA ALKALOIDS-OPIUM 16.2-30 MG RE SUPP
RECTAL | Status: AC
  Filled 2021-07-18: qty 1

## 2021-07-18 MED ORDER — DEXAMETHASONE SODIUM PHOSPHATE 10 MG/ML IJ SOLN
INTRAMUSCULAR | Status: DC | PRN
Start: 2021-07-18 — End: 2021-07-18
  Administered 2021-07-18: 10 mg via INTRAVENOUS

## 2021-07-18 MED ORDER — PROPOFOL 10 MG/ML IV BOLUS
INTRAVENOUS | Status: AC
  Filled 2021-07-18: qty 20

## 2021-07-18 MED ORDER — ONDANSETRON HCL 4 MG/2ML IJ SOLN
INTRAMUSCULAR | Status: DC | PRN
  Administered 2021-07-18: 4 mg via INTRAVENOUS

## 2021-07-18 MED ORDER — SODIUM CHLORIDE 0.9 % IR SOLN
Status: DC | PRN
  Administered 2021-07-18: 3000 mL via INTRAVESICAL

## 2021-07-18 MED ORDER — SODIUM CHLORIDE 0.9 % IV SOLN
INTRAVENOUS | Status: DC | PRN
  Administered 2021-07-18: 20 mL

## 2021-07-18 MED ORDER — CEFAZOLIN SODIUM-DEXTROSE 2-4 GM/100ML-% IV SOLN
INTRAVENOUS | Status: AC
  Filled 2021-07-18: qty 100

## 2021-07-18 MED ORDER — LACTATED RINGERS IV SOLN: INTRAVENOUS | Status: DC

## 2021-07-18 MED ORDER — MIDAZOLAM HCL 5 MG/5ML IJ SOLN
INTRAMUSCULAR | Status: DC | PRN
  Administered 2021-07-18: 2 mg via INTRAVENOUS

## 2021-07-18 MED ORDER — PHENYLEPHRINE HCL (PRESSORS) 10 MG/ML IV SOLN
INTRAVENOUS | Status: DC | PRN
  Administered 2021-07-18: 80 ug via INTRAVENOUS
  Administered 2021-07-18: 40 ug via INTRAVENOUS

## 2021-07-18 MED ORDER — PROPOFOL 10 MG/ML IV BOLUS
INTRAVENOUS | Status: DC | PRN
  Administered 2021-07-18: 50 mg via INTRAVENOUS
  Administered 2021-07-18: 150 mg via INTRAVENOUS

## 2021-07-18 MED ORDER — MIDAZOLAM HCL 2 MG/2ML IJ SOLN
INTRAMUSCULAR | Status: AC
  Filled 2021-07-18: qty 2

## 2021-07-18 MED ORDER — LIDOCAINE 2% (20 MG/ML) 5 ML SYRINGE
INTRAMUSCULAR | Status: AC
  Filled 2021-07-18: qty 5

## 2021-07-18 MED ORDER — LIDOCAINE 2% (20 MG/ML) 5 ML SYRINGE
INTRAMUSCULAR | Status: DC | PRN
  Administered 2021-07-18: 80 mg via INTRAVENOUS

## 2021-07-18 MED ORDER — BELLADONNA ALKALOIDS-OPIUM 16.2-60 MG RE SUPP
RECTAL | Status: DC | PRN
  Administered 2021-07-18: 1 via RECTAL

## 2021-07-18 SURGICAL SUPPLY — 12 items
BAG URO CATCHER STRL LF (MISCELLANEOUS) ×2 IMPLANT
CATH URET 5FR 28IN OPEN ENDED (CATHETERS) ×2 IMPLANT
CLOTH BEACON ORANGE TIMEOUT ST (SAFETY) ×2 IMPLANT
GLOVE SURG ENC TEXT LTX SZ7.5 (GLOVE) ×2 IMPLANT
GOWN STRL REUS W/TWL XL LVL3 (GOWN DISPOSABLE) ×2 IMPLANT
GUIDEWIRE STR DUAL SENSOR (WIRE) ×2 IMPLANT
KIT TURNOVER KIT A (KITS) ×2 IMPLANT
MANIFOLD NEPTUNE II (INSTRUMENTS) ×2 IMPLANT
PACK CYSTO (CUSTOM PROCEDURE TRAY) ×2 IMPLANT
STENT URET 6FRX24 CONTOUR (STENTS) ×2 IMPLANT
TUBING CONNECTING 10 (TUBING) ×2 IMPLANT
TUBING UROLOGY SET (TUBING) ×2 IMPLANT

## 2021-07-18 NOTE — Op Note (Signed)
Preoperative diagnosis:  Left malignant ureteral obstruction  Postoperative diagnosis:  Same  Procedure:  Cystoscopy left ureteral stent placement bilateral retrograde pyelography with interpretation   Surgeon: Ardis Hughs, MD  Anesthesia: General  Complications: None  Intraoperative findings:   #1: The patient's right retrograde pyelogram demonstrated normal caliber ureter with no filling defect or abnormality.  There is no hydroureteronephrosis. #2: The patient's left retrograde was normal in the distal ureter, but approximately 4 cm up from the UVJ there was a high-grade obstruction.  Proximal to this the patient had severe hydroureteronephrosis with a torturous ureter. #3: A 24 cm time 6 French double-J stent was able to be passed across the obstruction.  EBL: Minimal  Specimens: None  Indication: Anna [name] is a 66 y.o. patient with history of metastatic fallopian tube cancer, and her most recent CT scan demonstrated left hydroureteronephrosis down to the level of a progressive lesion in her pelvis. After reviewing the management options for treatment, he elected to proceed with the above surgical procedure(s). We have discussed the potential benefits and risks of the procedure, side effects of the proposed treatment, the likelihood of the patient achieving the goals of the procedure, and any potential problems that might occur during the procedure or recuperation. Informed consent has been obtained.  Description of procedure:  The patient was taken to the operating room and general anesthesia was induced.  The patient was placed in the dorsal lithotomy position, prepped and draped in the usual sterile fashion, and preoperative antibiotics were administered. A preoperative time-out was performed.   Cystourethroscopy was performed.  The patient's urethra was examined and was normal. The bladder was then systematically examined in its entirety. There was no  evidence for any bladder tumors, stones, or other mucosal pathology.    Attention then turned to the rightureteral orifice and a ureteral catheter was used to intubate the ureteral orifice.  Omnipaque contrast was injected through the ureteral catheter and a retrograde pyelogram was performed with findings as dictated above.  We then proceeded to perform a retrograde pyelogram on the left ureter and similar fashion.  A 0.38 sensor guidewire was then advanced up the left ureter into the renal pelvis under fluoroscopic guidance.  The wire was then backloaded through the cystoscope and a ureteral stent was advance over the wire using Seldinger technique.  The stent was positioned appropriately under fluoroscopic and cystoscopic guidance.  The wire was then removed with an adequate stent curl noted in the renal pelvis as well as in the bladder.  The bladder was then emptied and the procedure ended.  The patient appeared to tolerate the procedure well and without complications.  The patient was able to be awakened and transferred to the recovery unit in satisfactory condition.    Ardis Hughs, M.D.

## 2021-07-18 NOTE — Transfer of Care (Signed)
Immediate Anesthesia Transfer of Care Note  Patient: Anna [name]  Procedure(s) Performed: CYSTOSCOPY WITH BILATERAL RETROGRADE PYELOGRAM/ LEFT URETERAL STENT PLACEMENT (Bilateral)  Patient Location: PACU  Anesthesia Type:General  Level of Consciousness: awake, alert , oriented and patient cooperative  Airway & Oxygen Therapy: Patient Spontanous Breathing and Patient connected to face mask oxygen  Post-op Assessment: Report given to RN, Post -op Vital signs reviewed and stable and Patient moving all extremities  Post vital signs: Reviewed and stable  Last Vitals:  Vitals Value Taken Time  BP 115/71 07/18/21 0815  Temp 36.6 C 07/18/21 0815  Pulse 86 07/18/21 0818  Resp 12 07/18/21 0818  SpO2 100 % 07/18/21 0818  Vitals shown include unvalidated device data.  Last Pain:  Vitals:   07/18/21 0815  TempSrc:   PainSc: 0-No pain         Complications: No notable events documented.

## 2021-07-18 NOTE — Interval H&P Note (Signed)
History and Physical Interval Note:  07/18/2021 7:29 AM  [name]  has presented today for surgery, with the diagnosis of LEFT HYDRONEPHROSIS.  The various methods of treatment have been discussed with the patient and family. After consideration of risks, benefits and other options for treatment, the patient has consented to  Procedure(s): CYSTOSCOPY WITH BILATERAL RETROGRADE PYELOGRAM/ LEFT URETERAL STENT PLACEMENT (Bilateral) as a surgical intervention.  The patient's history has been reviewed, patient examined, no change in status, stable for surgery.  I have reviewed the patient's chart and labs.  Questions were answered to the patient's satisfaction.     Ardis Hughs

## 2021-07-18 NOTE — H&P (Signed)
Good 65 year old female presents today for discussion of left ureteral obstruction. The patient has a history of fallopian tube cancer 2018. All told she has had 24 cycles of chemotherapy. Her most recent CT scan demonstrated a soft tissue mass in the left pelvis causing obstruction of the left ureter. Fortunately she is not having any associated pain. She does have slightly worsening renal failure. The patient otherwise has no real significant past medical history.     ALLERGIES: None   MEDICATIONS: Omeprazole 20 mg capsule,delayed release  Levothyroxine 112 mcg capsule  Lidocaine-Prilocaine  Norvasc 5 mg tablet  Ocuvite Blue Light  Rosuvastatin Calcium 5 mg tablet  Wellbutrin Xl 300 mg tablet, extended release 24 hr  Xarelto 20 mg tablet  Zofran     GU PSH: None   NON-GU PSH: Appendectomy Cesarean Delivery, x 2 Hysterectomy     GU PMH: None     PMH Notes: cancer of fallopian tubes  retinitis pigmentosa  hypothyroidism   NON-GU PMH: Arthritis GERD Hypercholesterolemia Hypertension Sleep Apnea    FAMILY HISTORY: 1 Daughter - Daughter 1 son - Son Diabetes - Grandfather Heart Disease - Runs in Family   SOCIAL HISTORY: Marital Status: Married Preferred Language: English; Ethnicity: Not Hispanic Or Latino; Race: White Current Smoking Status: Patient has never smoked.   Tobacco Use Assessment Completed: Used Tobacco in last 30 days? Has never drank.  Drinks 1 caffeinated drink per day.    REVIEW OF SYSTEMS:    GU Review Female:   Patient reports get up at night to urinate. Patient denies frequent urination, hard to postpone urination, burning /pain with urination, leakage of urine, stream starts and stops, trouble starting your stream, have to strain to urinate, and being pregnant.  Gastrointestinal (Upper):   Patient denies nausea, vomiting, and indigestion/ heartburn.  Gastrointestinal (Lower):   Patient denies diarrhea and constipation.  Constitutional:   Patient  denies fever, night sweats, weight loss, and fatigue.  Skin:   Patient denies skin rash/ lesion and itching.  Eyes:   Patient denies blurred vision and double vision.  Ears/ Nose/ Throat:   Patient denies sore throat and sinus problems.  Hematologic/Lymphatic:   Patient denies swollen glands and easy bruising.  Cardiovascular:   Patient denies leg swelling and chest pains.  Respiratory:   Patient denies cough and shortness of breath.  Endocrine:   Patient denies excessive thirst.  Musculoskeletal:   Patient denies back pain and joint pain.  Neurological:   Patient denies headaches and dizziness.  Psychologic:   Patient denies anxiety and depression.   VITAL SIGNS:      07/17/2021 08:20 AM  Weight 175 lb / 79.38 kg  Height 63 in / 160.02 cm  BP 138/85 mmHg  Pulse 101 /min  BMI 31.0 kg/m   MULTI-SYSTEM PHYSICAL EXAMINATION:    Constitutional: Well-nourished. No physical deformities. Normally developed. Good grooming.  Neck: Neck symmetrical, not swollen. Normal tracheal position.  Respiratory: Normal breath sounds. No labored breathing, no use of accessory muscles.   Cardiovascular: Regular rate and rhythm. No murmur, no gallop. Normal temperature, normal extremity pulses, no swelling, no varicosities.   Lymphatic: No enlargement of neck, axillae, groin.  Skin: No paleness, no jaundice, no cyanosis. No lesion, no ulcer, no rash.  Neurologic / Psychiatric: Oriented to time, oriented to place, oriented to person. No depression, no anxiety, no agitation.  Gastrointestinal: No mass, no tenderness, no rigidity, non obese abdomen.  Eyes: Normal conjunctivae. Normal eyelids.  Ears, Nose, Mouth, and  Throat: Left ear no scars, no lesions, no masses. Right ear no scars, no lesions, no masses. Nose no scars, no lesions, no masses. Normal hearing. Normal lips.  Musculoskeletal: Normal gait and station of head and neck.     Complexity of Data:  Source Of History:  Patient  Records Review:    Previous Doctor Records, Previous Patient Records  Urine Test Review:   Urinalysis  X-Ray Review: C.T. Abdomen/Pelvis: Reviewed Films. Discussed With Patient.     PROCEDURES:          Urinalysis Dipstick Dipstick Cont'd  Color: Yellow Bilirubin: Neg mg/dL  Appearance: Clear Ketones: Neg mg/dL  Specific Gravity: 1.025 Blood: Neg ery/uL  pH: 5.5 Protein: Neg mg/dL  Glucose: Neg mg/dL Urobilinogen: 0.2 mg/dL    Nitrites: Neg    Leukocyte Esterase: Neg leu/uL    ASSESSMENT:      ICD-10 Details  1 GU:   Hydronephrosis - N13.0    PLAN:           Orders Labs Urine Culture          Document Letter(s):  Created for Patient: Clinical Summary         Notes:   The patient has malignant obstruction of the left ureter with slightly compromised renal function. In order to maximize her chemotherapy she will need optimal renal function. As such, I recommended that we proceed to the OR for cystoscopy, bilateral retrograde pyelograms and a left ureteral stent placement. I went through the surgery with her husband in detail. We discussed the risks and the associated benefits. I told her about the risk of stent occlusion because of progression of her mass. We also discussed the likelihood of stent exchanges in the future. We also did discussed the associated discomfort. We discussed alternatives including nephrostomy tube. The patient is interested in proceeding, will try to get this scheduled ASAP.

## 2021-07-18 NOTE — Anesthesia Procedure Notes (Signed)
Procedure Name: LMA Insertion Date/Time: 07/18/2021 7:45 AM Performed by: Myna Bright, CRNA Pre-anesthesia Checklist: Patient identified, Emergency Drugs available, Patient being monitored and Suction available Patient Re-evaluated:Patient Re-evaluated prior to induction Oxygen Delivery Method: Circle system utilized Preoxygenation: Pre-oxygenation with 100% oxygen Induction Type: IV induction Ventilation: Mask ventilation without difficulty LMA: LMA inserted LMA Size: 4.0 Number of attempts: 1 Placement Confirmation: positive ETCO2 and breath sounds checked- equal and bilateral Tube secured with: Tape Dental Injury: Teeth and Oropharynx as per pre-operative assessment

## 2021-07-18 NOTE — Discharge Instructions (Signed)
DISCHARGE INSTRUCTIONS FOR URETERAL STENT   MEDICATIONS:  1.  Resume all your other meds from home - except do not take any extra narcotic pain meds that you may have at home.  2. Pyridium is to help with the burning/stinging when you urinate. 3. Tramadol is for moderate/severe pain, otherwise taking upto 1000 mg every 6 hours of plainTylenol will help treat your pain.     ACTIVITY:  1. No strenuous activity x 1week  2. No driving while on narcotic pain medications  3. Drink plenty of water  4. Continue to walk at home - you can still get blood clots when you are at home, so keep active, but don't over do it.  5. May return to work/school tomorrow or when you feel ready   BATHING:  1. You can shower and we recommend daily showers   SIGNS/SYMPTOMS TO CALL:  Please call us if you have a fever greater than 101.5, uncontrolled nausea/vomiting, uncontrolled pain, dizziness, unable to urinate, bloody urine, chest pain, shortness of breath, leg swelling, leg pain, redness around wound, drainage from wound, or any other concerns or questions.   You can reach [place] at [phone].   FOLLOW-UP:  1. We will plan f/u for 3 months, please contact the office if you need to be seen sooner.

## 2021-07-18 NOTE — Anesthesia Postprocedure Evaluation (Signed)
Anesthesia Post Note  Patient: Anna [name]  Procedure(s) Performed: CYSTOSCOPY WITH BILATERAL RETROGRADE PYELOGRAM/ LEFT URETERAL STENT PLACEMENT (Bilateral)     Patient location during evaluation: PACU Anesthesia Type: General Level of consciousness: sedated and patient cooperative Pain management: pain level controlled Vital Signs Assessment: post-procedure vital signs reviewed and stable Respiratory status: spontaneous breathing Cardiovascular status: stable Anesthetic complications: no   No notable events documented.  Last Vitals:  Vitals:   07/18/21 0845 07/18/21 0900  BP: 128/78 (!) 141/78  Pulse: 86 92  Resp: 13 14  Temp: 36.4 C 36.5 C  SpO2: 97% 97%    Last Pain:  Vitals:   07/18/21 0900  TempSrc:   PainSc: 0-No pain                 Nolon Nations

## 2021-07-19 ENCOUNTER — Encounter (HOSPITAL_COMMUNITY): Payer: Self-pay | Admitting: Urology

## 2021-08-06 ENCOUNTER — Other Ambulatory Visit: Payer: Self-pay | Admitting: Internal Medicine

## 2021-08-06 ENCOUNTER — Encounter: Payer: Self-pay | Admitting: Internal Medicine

## 2021-08-06 ENCOUNTER — Encounter: Payer: Self-pay | Admitting: Oncology

## 2021-08-06 ENCOUNTER — Other Ambulatory Visit: Payer: Self-pay

## 2021-08-06 NOTE — Telephone Encounter (Signed)
Notify  - I reviewed her cholesterol.  Cholesterol looks good.  Waiting for TSH results.

## 2021-08-07 ENCOUNTER — Other Ambulatory Visit: Payer: Self-pay

## 2021-08-07 ENCOUNTER — Inpatient Hospital Stay: Payer: BC Managed Care – PPO | Attending: Oncology | Admitting: Oncology

## 2021-08-07 ENCOUNTER — Inpatient Hospital Stay: Payer: BC Managed Care – PPO

## 2021-08-07 VITALS — BP 140/85 | HR 96 | Temp 97.8°F | Resp 18 | Ht 63.0 in | Wt 175.0 lb

## 2021-08-07 DIAGNOSIS — Z9221 Personal history of antineoplastic chemotherapy: Secondary | ICD-10-CM | POA: Diagnosis not present

## 2021-08-07 DIAGNOSIS — C5702 Malignant neoplasm of left fallopian tube: Secondary | ICD-10-CM | POA: Diagnosis not present

## 2021-08-07 DIAGNOSIS — E039 Hypothyroidism, unspecified: Secondary | ICD-10-CM | POA: Diagnosis not present

## 2021-08-07 DIAGNOSIS — F32A Depression, unspecified: Secondary | ICD-10-CM | POA: Insufficient documentation

## 2021-08-07 DIAGNOSIS — Z7901 Long term (current) use of anticoagulants: Secondary | ICD-10-CM | POA: Insufficient documentation

## 2021-08-07 DIAGNOSIS — M545 Low back pain, unspecified: Secondary | ICD-10-CM | POA: Insufficient documentation

## 2021-08-07 DIAGNOSIS — R944 Abnormal results of kidney function studies: Secondary | ICD-10-CM | POA: Insufficient documentation

## 2021-08-07 DIAGNOSIS — N1330 Unspecified hydronephrosis: Secondary | ICD-10-CM | POA: Diagnosis not present

## 2021-08-07 LAB — CBC WITH DIFFERENTIAL (CANCER CENTER ONLY)
Abs Immature Granulocytes: 0.01 10*3/uL (ref 0.00–0.07)
Basophils Absolute: 0 10*3/uL (ref 0.0–0.1)
Basophils Relative: 0 %
Eosinophils Absolute: 0.1 10*3/uL (ref 0.0–0.5)
Eosinophils Relative: 2 %
HCT: 30.1 % — ABNORMAL LOW (ref 36.0–46.0)
Hemoglobin: 9.5 g/dL — ABNORMAL LOW (ref 12.0–15.0)
Immature Granulocytes: 0 %
Lymphocytes Relative: 48 %
Lymphs Abs: 2.3 10*3/uL (ref 0.7–4.0)
MCH: 34.8 pg — ABNORMAL HIGH (ref 26.0–34.0)
MCHC: 31.6 g/dL (ref 30.0–36.0)
MCV: 110.3 fL — ABNORMAL HIGH (ref 80.0–100.0)
Monocytes Absolute: 0.6 10*3/uL (ref 0.1–1.0)
Monocytes Relative: 11 %
Neutro Abs: 1.9 10*3/uL (ref 1.7–7.7)
Neutrophils Relative %: 39 %
Platelet Count: 145 10*3/uL — ABNORMAL LOW (ref 150–400)
RBC: 2.73 MIL/uL — ABNORMAL LOW (ref 3.87–5.11)
RDW: 14.2 % (ref 11.5–15.5)
WBC Count: 4.9 10*3/uL (ref 4.0–10.5)
nRBC: 0 % (ref 0.0–0.2)

## 2021-08-07 LAB — CMP (CANCER CENTER ONLY)
ALT: 14 U/L (ref 0–44)
AST: 15 U/L (ref 15–41)
Albumin: 4.1 g/dL (ref 3.5–5.0)
Alkaline Phosphatase: 76 U/L (ref 38–126)
Anion gap: 11 (ref 5–15)
BUN: 19 mg/dL (ref 8–23)
CO2: 25 mmol/L (ref 22–32)
Calcium: 9.3 mg/dL (ref 8.9–10.3)
Chloride: 104 mmol/L (ref 98–111)
Creatinine: 1.24 mg/dL — ABNORMAL HIGH (ref 0.44–1.00)
GFR, Estimated: 49 mL/min — ABNORMAL LOW (ref >60)
Glucose, Bld: 102 mg/dL — ABNORMAL HIGH (ref 70–99)
Potassium: 3.9 mmol/L (ref 3.5–5.1)
Sodium: 140 mmol/L (ref 135–145)
Total Bilirubin: 0.3 mg/dL (ref 0.3–1.2)
Total Protein: 7.2 g/dL (ref 6.5–8.1)

## 2021-08-07 LAB — TSH: TSH: 0.067 u[IU]/mL — ABNORMAL LOW (ref 0.350–4.500)

## 2021-08-07 NOTE — Progress Notes (Signed)
[place] OFFICE PROGRESS NOTE   Diagnosis: Fallopian carcinoma  INTERVAL HISTORY:   Ms. Egloff returns as scheduled.  She feels well.  No difficulty with bowel function.  She underwent placement of a left ureter stent on 07/18/2021.  No hematuria.  No significant pain associated with the stent.  No neuropathy symptoms.  Objective:  Vital signs in last 24 hours:  Blood pressure 140/85, pulse 96, temperature 97.8 F (36.6 C), resp. rate 18, height 5' 3"  (1.6 m), weight 175 lb (79.4 kg), SpO2 98 %.    Resp: Lungs clear bilaterally Cardio: Regular rate and rhythm GI: No hepatosplenomegaly, no apparent ascites, nontender, no mass Vascular: No leg edema   Portacath/PICC-without erythema  Lab Results:  Lab Results  Component Value Date   WBC 4.9 08/07/2021   HGB 9.5 (L) 08/07/2021   HCT 30.1 (L) 08/07/2021   MCV 110.3 (H) 08/07/2021   PLT 145 (L) 08/07/2021   NEUTROABS 1.9 08/07/2021    CMP  Lab Results  Component Value Date   NA 144 07/18/2021   K 3.8 07/18/2021   CL 111 07/18/2021   CO2 24 07/18/2021   GLUCOSE 102 (H) 07/18/2021   BUN 27 (H) 07/18/2021   CREATININE 1.34 (H) 07/18/2021   CALCIUM 9.2 07/18/2021   PROT 6.9 07/10/2021   ALBUMIN 3.7 07/10/2021   AST 14 (L) 07/10/2021   ALT 12 07/10/2021   ALKPHOS 80 07/10/2021   BILITOT 0.3 07/10/2021   GFRNONAA 44 (L) 07/18/2021   GFRAA >60 08/10/2020    Lab Results  Component Value Date   CEA1 <1.00 06/27/2017     Medications: I have reviewed the patient's current medications.   Assessment/Plan: left abdomen/pelvic pain CT abdomen/pelvis 06/26/2017-soft tissue implants in the lower anterior peritoneum with implants at the paracolic gutters and left pelvic sidewall Negative MYRIAD hereditary cancer panel Elevated CA 125 CT biopsy of anterior omental mass 07/04/2017-Metastatic carcinoma consistent with a gynecologic primary Exploratory laparotomy, total hysterectomy, bilateral  salpingo-oophorectomy, and omentectomy 07/17/2017, pathology revealed a high-grade serous carcinoma of the left fallopian tube with metastatic disease to the omentum, right fallopian tube, and bilateral ovaries,pT3,pNx, optimal debulking with small peritoneal studding of the diaphragm and a remaining thin rind of tumor at the posterior peritoneum in the pelvis Cycle 1 adjuvant Taxol/carboplatin 08/05/2017 Cycle 2 adjuvant Taxol/carboplatin 08/26/2017 Cycle 3 adjuvant Taxol/carboplatin 09/17/2017-Taxol dose reduced secondary to neuropathy and bone pain  Cycle 4 adjuvant Taxol/carboplatin 10/10/2017 Cycle 5 adjuvant Taxol/carboplatin 10/31/2017 Cycle 6 adjuvant Taxol/carboplatin 11/21/2017 CT abdomen/pelvis 08/08/2019- narrowing of rectosigmoid colon with eccentric serosal implant, 10 mm lymph node at the superior rectal vein, left upper quadrant soft tissue implant, 9 mm precaval node, 15 mm left internal iliac node, no ascites Cycle 1 Taxol/carboplatin 08/31/2019 Cycle 2 Taxol/carboplatin/Avastin 09/21/2019 Cycle 3 Taxol/Carboplatin 10/12/2019 (Avastin discontinued) Cycle 4 Taxol/carboplatin November 02, 2019 CT abdomen/pelvis 11/17/2019-decrease in size of previously prominent pericaval, left internal iliac, and superior rectal vein nodes, resolved rectosigmoid wall thickening Cycle 5 Taxol/carboplatin 11/22/2019 Cycle 6 Taxol/carboplatin 12/13/2019 Olaparib 01/05/2020 CT abdomen/pelvis 07/10/2020-enlarging soft tissue at the distal left ureter, the ureter is not dilated, peritoneal implant adjacent to the splenic flexure, enlarged left superior rectal node, lymph nodes along the course of the distal IMV have enlarged, eccentric thickening of the sigmoid colon not seen  Olaraparib discontinued 07/12/2020 Cycle 1 Taxol/carboplatin 07/21/2020 Cycle 2 Taxol/carboplatin 08/10/2020 (carboplatin dose reduced secondary to thrombocytopenia) Cycle 3 Taxol/carboplatin 09/05/2020 Cycle 4 Taxol/carboplatin  09/26/2020 Cycle 5 Taxol/carboplatin 10/24/2020 CT abdomen/pelvis 11/13/2020-overall  stable disease, no new site of metastatic disease, generally stable small abdominal/pelvic lymph nodes, slight enlargement of a peritoneal implant in the left pelvis, slight decrease in size of another pelvic implant Cycle 6 Taxol/carboplatin 11/14/2020 Cycle 7 Taxol/carboplatin 12/05/2020 Cycle 8 Taxol/carboplatin 12/26/2020 Cycle 9 Taxol/carboplatin 01/16/2021 Cycle 10 Taxol/carboplatin 02/06/2021 Cycle 11 Taxol/carboplatin 02/27/2021 Cycle 12 Taxol/carboplatin 03/20/2021 Cycle 13 Taxol/carboplatin 04/10/2021 Cycle 14 Taxol/carboplatin 05/08/2021 Cycle 15 Taxol/carboplatin 05/29/2021 Cycle 16 Taxol/carboplatin 06/19/2021 CT abdomen/pelvis 07/06/2021-increased size of a left pelvic soft tissue mass with obstruction of the distal left ureter and new moderate left hydronephrosis, no change in mild pelvic lymphadenopathy Systemic therapy placed on hold   Depression   3.   Hypothyroid   4.   Family history of uterine cancer-paternal grandmother   19.   Acute onset dyspnea/pleuritic left-sided chest pain 10/02/2019 CT chest 10/04/2019-left lower lobe pulmonary emboli, multifocal pneumonia lower lobes On Xarelto, status post course of Levaquin.  6.  Acute onset left lower back pain 01/24/2021-resolved after a Medrol Dosepak  7.  Mild elevation of the creatinine, renal ultrasound 05/31/2021-mild left hydronephrosis CT 07/06/2021-new moderate left hydronephrosis, left ureter stent placed 07/18/2021     Disposition: Ms. Salome appears stable.  She underwent placement of a left ureter stent on 07/18/2021.  The creatinine is slightly improved.  Anemia has improved while off of chemotherapy.  We discussed treatment options.  We discussed continuing Taxol/carboplatin, changing to a different systemic therapy regimen, and a treatment break.  There was evidence of disease progression on the CT 07/06/2021.  I recommend a treatment  break.  She is in agreement.  Ms. Cameron will return for an office visit and Port-A-Cath flush in approximately 6 weeks.  We will plan for a restaging CT at a 3-58-monthinterval.  We will check the CA125 when she returns in 6 weeks.  GBetsy Coder MD  08/07/2021  10:32 AM

## 2021-08-08 ENCOUNTER — Encounter: Payer: Self-pay | Admitting: Oncology

## 2021-08-08 LAB — CA 125: Cancer Antigen (CA) 125: 76.7 U/mL — ABNORMAL HIGH (ref 0.0–38.1)

## 2021-08-08 MED ORDER — LEVOTHYROXINE SODIUM 100 MCG PO TABS: 100.0000 ug | ORAL_TABLET | Freq: Every day | ORAL | 2 refills | Status: DC

## 2021-08-08 NOTE — Telephone Encounter (Signed)
Notify Anna [name] that her TSH remains suppressed.  Confirm she is on synthroid 159mcg q day.  If correct, then decrease synthroid to 172mcg q day.  Recheck tsh in 6 weeks.

## 2021-08-09 ENCOUNTER — Other Ambulatory Visit: Payer: Self-pay

## 2021-08-09 DIAGNOSIS — C5702 Malignant neoplasm of left fallopian tube: Secondary | ICD-10-CM

## 2021-09-03 ENCOUNTER — Encounter: Payer: Self-pay | Admitting: Oncology

## 2021-09-05 ENCOUNTER — Telehealth: Payer: Self-pay | Admitting: Internal Medicine

## 2021-09-05 NOTE — Telephone Encounter (Signed)
Spoken to patient she stated she is ok with change. Pharmacy has also be notified.

## 2021-09-05 NOTE — Telephone Encounter (Signed)
Need to confirm with pt that she is ok with change.  If she is ok, then ok

## 2021-09-05 NOTE — Telephone Encounter (Signed)
[facility] called and would like to know if they can change the manufacture on patient's levothyroxine (SYNTHROID) 100 MCG tablet.

## 2021-09-05 NOTE — Telephone Encounter (Signed)
Left message for patient to return call back.  

## 2021-09-14 ENCOUNTER — Encounter: Payer: Self-pay | Admitting: Oncology

## 2021-09-17 ENCOUNTER — Other Ambulatory Visit: Payer: Self-pay

## 2021-09-17 DIAGNOSIS — C5702 Malignant neoplasm of left fallopian tube: Secondary | ICD-10-CM

## 2021-09-18 ENCOUNTER — Inpatient Hospital Stay: Payer: BC Managed Care – PPO | Attending: Oncology | Admitting: Oncology

## 2021-09-18 ENCOUNTER — Other Ambulatory Visit: Payer: Self-pay

## 2021-09-18 ENCOUNTER — Inpatient Hospital Stay: Payer: BC Managed Care – PPO

## 2021-09-18 VITALS — BP 130/89 | HR 93 | Temp 98.7°F | Resp 18 | Ht 63.0 in | Wt 174.4 lb

## 2021-09-18 DIAGNOSIS — Z9221 Personal history of antineoplastic chemotherapy: Secondary | ICD-10-CM | POA: Diagnosis not present

## 2021-09-18 DIAGNOSIS — Z86711 Personal history of pulmonary embolism: Secondary | ICD-10-CM | POA: Insufficient documentation

## 2021-09-18 DIAGNOSIS — Z7901 Long term (current) use of anticoagulants: Secondary | ICD-10-CM | POA: Insufficient documentation

## 2021-09-18 DIAGNOSIS — F32A Depression, unspecified: Secondary | ICD-10-CM | POA: Insufficient documentation

## 2021-09-18 DIAGNOSIS — E039 Hypothyroidism, unspecified: Secondary | ICD-10-CM | POA: Insufficient documentation

## 2021-09-18 DIAGNOSIS — Z23 Encounter for immunization: Secondary | ICD-10-CM | POA: Diagnosis not present

## 2021-09-18 DIAGNOSIS — K760 Fatty (change of) liver, not elsewhere classified: Secondary | ICD-10-CM | POA: Diagnosis not present

## 2021-09-18 DIAGNOSIS — R911 Solitary pulmonary nodule: Secondary | ICD-10-CM | POA: Insufficient documentation

## 2021-09-18 DIAGNOSIS — N1330 Unspecified hydronephrosis: Secondary | ICD-10-CM | POA: Diagnosis not present

## 2021-09-18 DIAGNOSIS — C5702 Malignant neoplasm of left fallopian tube: Secondary | ICD-10-CM | POA: Insufficient documentation

## 2021-09-18 DIAGNOSIS — I700 Atherosclerosis of aorta: Secondary | ICD-10-CM | POA: Insufficient documentation

## 2021-09-18 LAB — CBC WITH DIFFERENTIAL (CANCER CENTER ONLY)
Abs Immature Granulocytes: 0.02 10*3/uL (ref 0.00–0.07)
Basophils Absolute: 0 10*3/uL (ref 0.0–0.1)
Basophils Relative: 1 %
Eosinophils Absolute: 0.1 10*3/uL (ref 0.0–0.5)
Eosinophils Relative: 1 %
HCT: 30.6 % — ABNORMAL LOW (ref 36.0–46.0)
Hemoglobin: 9.7 g/dL — ABNORMAL LOW (ref 12.0–15.0)
Immature Granulocytes: 1 %
Lymphocytes Relative: 48 %
Lymphs Abs: 2.1 10*3/uL (ref 0.7–4.0)
MCH: 33.3 pg (ref 26.0–34.0)
MCHC: 31.7 g/dL (ref 30.0–36.0)
MCV: 105.2 fL — ABNORMAL HIGH (ref 80.0–100.0)
Monocytes Absolute: 0.4 10*3/uL (ref 0.1–1.0)
Monocytes Relative: 10 %
Neutro Abs: 1.7 10*3/uL (ref 1.7–7.7)
Neutrophils Relative %: 39 %
Platelet Count: 203 10*3/uL (ref 150–400)
RBC: 2.91 MIL/uL — ABNORMAL LOW (ref 3.87–5.11)
RDW: 14 % (ref 11.5–15.5)
WBC Count: 4.3 10*3/uL (ref 4.0–10.5)
nRBC: 0 % (ref 0.0–0.2)

## 2021-09-18 LAB — CMP (CANCER CENTER ONLY)
ALT: 12 U/L (ref 0–44)
AST: 13 U/L — ABNORMAL LOW (ref 15–41)
Albumin: 3.8 g/dL (ref 3.5–5.0)
Alkaline Phosphatase: 81 U/L (ref 38–126)
Anion gap: 8 (ref 5–15)
BUN: 16 mg/dL (ref 8–23)
CO2: 27 mmol/L (ref 22–32)
Calcium: 8.8 mg/dL — ABNORMAL LOW (ref 8.9–10.3)
Chloride: 105 mmol/L (ref 98–111)
Creatinine: 1.2 mg/dL — ABNORMAL HIGH (ref 0.44–1.00)
GFR, Estimated: 51 mL/min — ABNORMAL LOW (ref >60)
Glucose, Bld: 95 mg/dL (ref 70–99)
Potassium: 3.8 mmol/L (ref 3.5–5.1)
Sodium: 140 mmol/L (ref 135–145)
Total Bilirubin: 0.4 mg/dL (ref 0.3–1.2)
Total Protein: 7.2 g/dL (ref 6.5–8.1)

## 2021-09-18 LAB — TSH: TSH: 0.318 u[IU]/mL — ABNORMAL LOW (ref 0.350–4.500)

## 2021-09-18 MED ORDER — INFLUENZA VAC A&B SA ADJ QUAD 0.5 ML IM PRSY
0.5000 mL | PREFILLED_SYRINGE | Freq: Once | INTRAMUSCULAR | Status: AC
  Administered 2021-09-18: 0.5 mL via INTRAMUSCULAR
  Filled 2021-09-18: qty 0.5

## 2021-09-18 MED ORDER — INFLUENZA VAC A&B SA ADJ QUAD 0.5 ML IM PRSY: 0.5000 mL | PREFILLED_SYRINGE | Freq: Once | INTRAMUSCULAR | Status: DC

## 2021-09-18 NOTE — Progress Notes (Signed)
[place] OFFICE PROGRESS NOTE   Diagnosis: Fallopian tube carcinoma  INTERVAL HISTORY:   [name] returns as scheduled.  She reports pain at the right occiput for the past 2 months.  No tenderness.  No neck pain.  No arm pain or numbness.  No difficulty with bowel function.  Objective:  Vital signs in last 24 hours:  Blood pressure 130/89, pulse 93, temperature 98.7 F (37.1 C), temperature source Oral, resp. rate 18, height _0  (1.6 m), weight 174 lb 6.4 oz (79.1 kg), SpO2 99 %.    HEENT: Neck without mass Musculoskeletal: No tenderness at the occiput or neck.  No mass Resp: Lungs clear bilaterally, slight decrease in breath sounds at the left lower posterior chest Cardio: Regular rate and rhythm GI: No hepatosplenomegaly, no mass, no apparent ascites Vascular: No leg edema   Portacath/PICC-without erythema  Lab Results:  Lab Results  Component Value Date   WBC 4.3 09/18/2021   HGB 9.7 (L) 09/18/2021   HCT 30.6 (L) 09/18/2021   MCV 105.2 (H) 09/18/2021   PLT 203 09/18/2021   NEUTROABS 1.7 09/18/2021    CMP  Lab Results  Component Value Date   NA 140 08/07/2021   K 3.9 08/07/2021   CL 104 08/07/2021   CO2 25 08/07/2021   GLUCOSE 102 (H) 08/07/2021   BUN 19 08/07/2021   CREATININE 1.24 (H) 08/07/2021   CALCIUM 9.3 08/07/2021   PROT 7.2 08/07/2021   ALBUMIN 4.1 08/07/2021   AST 15 08/07/2021   ALT 14 08/07/2021   ALKPHOS 76 08/07/2021   BILITOT 0.3 08/07/2021   GFRNONAA 49 (L) 08/07/2021   GFRAA >60 08/10/2020    Lab Results  Component Value Date   CEA1 <1.00 06/27/2017     Medications: I have reviewed the patient's current medications.   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 omentum, right fallopian tube, and bilateral ovaries,pT3,pNx, optimal debulking with small peritoneal studding of the diaphragm and a remaining thin rind of tumor at the posterior peritoneum in the pelvis Cycle 1 adjuvant Taxol/carboplatin 08/05/2017 Cycle 2 adjuvant Taxol/carboplatin 08/26/2017 Cycle 3 adjuvant Taxol/carboplatin 09/17/2017-Taxol dose reduced secondary to neuropathy and bone pain  Cycle 4 adjuvant Taxol/carboplatin 10/10/2017 Cycle 5 adjuvant Taxol/carboplatin 10/31/2017 Cycle 6 adjuvant Taxol/carboplatin 11/21/2017 CT abdomen/pelvis 08/08/2019- narrowing of rectosigmoid colon with eccentric serosal implant, 10 mm lymph node at the superior rectal vein, left upper quadrant soft tissue implant, 9 mm precaval node, 15 mm left internal iliac node, no ascites Cycle 1 Taxol/carboplatin 08/31/2019 Cycle 2 Taxol/carboplatin/Avastin 09/21/2019 Cycle 3 Taxol/Carboplatin 10/12/2019 (Avastin discontinued) Cycle 4 Taxol/carboplatin November 02, 2019 CT abdomen/pelvis 11/17/2019-decrease in size of previously prominent pericaval, left internal iliac, and superior rectal vein nodes, resolved rectosigmoid wall thickening Cycle 5 Taxol/carboplatin 11/22/2019 Cycle 6 Taxol/carboplatin 12/13/2019 Olaparib 01/05/2020 CT abdomen/pelvis 07/10/2020-enlarging soft tissue at the distal left ureter, the ureter is not dilated, peritoneal implant adjacent to the splenic flexure, enlarged left superior rectal node, lymph nodes along the course of the distal IMV have enlarged, eccentric thickening of the sigmoid colon not seen  Olaraparib discontinued 07/12/2020 Cycle 1 Taxol/carboplatin 07/21/2020 Cycle 2  Taxol/carboplatin 08/10/2020  (carboplatin dose reduced secondary to thrombocytopenia) Cycle 3 Taxol/carboplatin 09/05/2020 Cycle 4 Taxol/carboplatin 09/26/2020 Cycle 5 Taxol/carboplatin 10/24/2020 CT abdomen/pelvis 11/13/2020-overall stable disease, no new site of metastatic disease, generally stable small abdominal/pelvic lymph nodes, slight enlargement of a peritoneal implant in the left pelvis, slight decrease in size of another pelvic implant Cycle 6 Taxol/carboplatin 11/14/2020 Cycle 7 Taxol/carboplatin 12/05/2020 Cycle 8 Taxol/carboplatin 12/26/2020 Cycle 9 Taxol/carboplatin 01/16/2021 Cycle 10 Taxol/carboplatin 02/06/2021 Cycle 11 Taxol/carboplatin 02/27/2021 Cycle 12 Taxol/carboplatin 03/20/2021 Cycle 13 Taxol/carboplatin 04/10/2021 Cycle 14 Taxol/carboplatin 05/08/2021 Cycle 15 Taxol/carboplatin 05/29/2021 Cycle 16 Taxol/carboplatin 06/19/2021 CT abdomen/pelvis 07/06/2021-increased size of a left pelvic soft tissue mass with obstruction of the distal left ureter and new moderate left hydronephrosis, no change in mild pelvic lymphadenopathy Systemic therapy placed on hold   Depression   3.   Hypothyroid   4.   Family history of uterine cancer-paternal grandmother   36.   Acute onset dyspnea/pleuritic left-sided chest pain 10/02/2019 CT chest 10/04/2019-left lower lobe pulmonary emboli, multifocal pneumonia lower lobes On Xarelto, status post course of Levaquin.  6.  Acute onset left lower back pain 01/24/2021-resolved after a Medrol Dosepak  7.  Mild elevation of the creatinine, renal ultrasound 05/31/2021-mild left hydronephrosis CT 07/06/2021-new moderate left hydronephrosis, left ureter stent placed 07/18/2021   Disposition: [name] appears stable.  We will follow-up on the CA125 today.  She will undergo restaging CTs prior to an office visit on 10/17/2021.  The etiology of the right occiput pain is unclear.  I have a low suspicion for a skull metastasis.  The pain is persisted for the past 2 months.  We will  schedule a head CT at the time of the restaging abdomen/pelvis scan later this month.  We will consider a cervical spine MRI if the pain persist and the head CT is negative.  [name] will receive an influenza vaccine today.  [name], MD  09/18/2021  10:57 AM

## 2021-09-19 ENCOUNTER — Encounter: Payer: Self-pay | Admitting: Internal Medicine

## 2021-09-19 DIAGNOSIS — E039 Hypothyroidism, unspecified: Secondary | ICD-10-CM

## 2021-09-19 LAB — CA 125: Cancer Antigen (CA) 125: 73 U/mL — ABNORMAL HIGH (ref 0.0–38.1)

## 2021-09-19 MED ORDER — LEVOTHYROXINE SODIUM 75 MCG PO TABS: 75.0000 ug | ORAL_TABLET | Freq: Every day | ORAL | 3 refills | Status: DC

## 2021-09-19 NOTE — Telephone Encounter (Signed)
Patient notified sent script as ordered.

## 2021-09-19 NOTE — Telephone Encounter (Signed)
Reviewed TSH results.  TSH is suppressed.  Confirm on synthroid 120mcg q day.  If this is correct, then change to 42mccg q day.  Recheck tsh in 6-8 weeks.

## 2021-09-25 ENCOUNTER — Other Ambulatory Visit: Payer: Self-pay | Admitting: Internal Medicine

## 2021-10-15 ENCOUNTER — Ambulatory Visit (HOSPITAL_BASED_OUTPATIENT_CLINIC_OR_DEPARTMENT_OTHER)
Admission: RE | Admit: 2021-10-15 | Discharge: 2021-10-15 | Disposition: A | Discharge status: Home / Self Care | Payer: BC Managed Care – PPO | Source: Ambulatory Visit | Attending: Oncology | Admitting: Oncology

## 2021-10-15 ENCOUNTER — Inpatient Hospital Stay: Payer: BC Managed Care – PPO

## 2021-10-15 ENCOUNTER — Other Ambulatory Visit: Payer: Self-pay

## 2021-10-15 DIAGNOSIS — C5702 Malignant neoplasm of left fallopian tube: Secondary | ICD-10-CM | POA: Insufficient documentation

## 2021-10-15 DIAGNOSIS — Z86711 Personal history of pulmonary embolism: Secondary | ICD-10-CM | POA: Diagnosis not present

## 2021-10-15 DIAGNOSIS — F32A Depression, unspecified: Secondary | ICD-10-CM | POA: Diagnosis not present

## 2021-10-15 DIAGNOSIS — N1330 Unspecified hydronephrosis: Secondary | ICD-10-CM | POA: Diagnosis not present

## 2021-10-15 DIAGNOSIS — Z9221 Personal history of antineoplastic chemotherapy: Secondary | ICD-10-CM | POA: Diagnosis not present

## 2021-10-15 DIAGNOSIS — K760 Fatty (change of) liver, not elsewhere classified: Secondary | ICD-10-CM | POA: Diagnosis not present

## 2021-10-15 DIAGNOSIS — Z7901 Long term (current) use of anticoagulants: Secondary | ICD-10-CM | POA: Diagnosis not present

## 2021-10-15 DIAGNOSIS — I700 Atherosclerosis of aorta: Secondary | ICD-10-CM | POA: Diagnosis not present

## 2021-10-15 DIAGNOSIS — R519 Headache, unspecified: Secondary | ICD-10-CM | POA: Diagnosis not present

## 2021-10-15 DIAGNOSIS — E039 Hypothyroidism, unspecified: Secondary | ICD-10-CM | POA: Diagnosis not present

## 2021-10-15 DIAGNOSIS — Z23 Encounter for immunization: Secondary | ICD-10-CM | POA: Diagnosis not present

## 2021-10-15 DIAGNOSIS — R911 Solitary pulmonary nodule: Secondary | ICD-10-CM | POA: Diagnosis not present

## 2021-10-15 LAB — BASIC METABOLIC PANEL - CANCER CENTER ONLY
Anion gap: 10 (ref 5–15)
BUN: 20 mg/dL (ref 8–23)
CO2: 26 mmol/L (ref 22–32)
Calcium: 9.5 mg/dL (ref 8.9–10.3)
Chloride: 103 mmol/L (ref 98–111)
Creatinine: 1.17 mg/dL — ABNORMAL HIGH (ref 0.44–1.00)
GFR, Estimated: 52 mL/min — ABNORMAL LOW (ref >60)
Glucose, Bld: 99 mg/dL (ref 70–99)
Potassium: 4 mmol/L (ref 3.5–5.1)
Sodium: 139 mmol/L (ref 135–145)

## 2021-10-15 MED ORDER — IOHEXOL 300 MG/ML  SOLN
80.0000 mL | Freq: Once | INTRAMUSCULAR | Status: AC | PRN
  Administered 2021-10-15: 10:00:00 80 mL via INTRAVENOUS

## 2021-10-15 MED ORDER — HEPARIN SOD (PORK) LOCK FLUSH 100 UNIT/ML IV SOLN
500.0000 [IU] | Freq: Once | INTRAVENOUS | Status: AC
  Administered 2021-10-15: 10:00:00 500 [IU] via INTRAVENOUS

## 2021-10-16 DIAGNOSIS — N130 Hydronephrosis with ureteropelvic junction obstruction: Secondary | ICD-10-CM | POA: Diagnosis not present

## 2021-10-16 DIAGNOSIS — C5700 Malignant neoplasm of unspecified fallopian tube: Secondary | ICD-10-CM | POA: Diagnosis not present

## 2021-10-16 LAB — CA 125: Cancer Antigen (CA) 125: 61.7 U/mL — ABNORMAL HIGH (ref 0.0–38.1)

## 2021-10-17 ENCOUNTER — Telehealth: Payer: Self-pay | Admitting: *Deleted

## 2021-10-17 ENCOUNTER — Other Ambulatory Visit: Payer: Self-pay

## 2021-10-17 ENCOUNTER — Inpatient Hospital Stay (HOSPITAL_BASED_OUTPATIENT_CLINIC_OR_DEPARTMENT_OTHER): Payer: BC Managed Care – PPO | Admitting: Oncology

## 2021-10-17 VITALS — BP 128/82 | HR 102 | Temp 98.1°F | Resp 20 | Ht 63.0 in | Wt 174.6 lb

## 2021-10-17 DIAGNOSIS — K760 Fatty (change of) liver, not elsewhere classified: Secondary | ICD-10-CM | POA: Diagnosis not present

## 2021-10-17 DIAGNOSIS — C5702 Malignant neoplasm of left fallopian tube: Secondary | ICD-10-CM

## 2021-10-17 DIAGNOSIS — F32A Depression, unspecified: Secondary | ICD-10-CM | POA: Diagnosis not present

## 2021-10-17 DIAGNOSIS — I700 Atherosclerosis of aorta: Secondary | ICD-10-CM | POA: Diagnosis not present

## 2021-10-17 DIAGNOSIS — R911 Solitary pulmonary nodule: Secondary | ICD-10-CM | POA: Diagnosis not present

## 2021-10-17 DIAGNOSIS — N1330 Unspecified hydronephrosis: Secondary | ICD-10-CM | POA: Diagnosis not present

## 2021-10-17 DIAGNOSIS — E039 Hypothyroidism, unspecified: Secondary | ICD-10-CM

## 2021-10-17 DIAGNOSIS — Z7901 Long term (current) use of anticoagulants: Secondary | ICD-10-CM | POA: Diagnosis not present

## 2021-10-17 DIAGNOSIS — Z23 Encounter for immunization: Secondary | ICD-10-CM | POA: Diagnosis not present

## 2021-10-17 DIAGNOSIS — Z9221 Personal history of antineoplastic chemotherapy: Secondary | ICD-10-CM | POA: Diagnosis not present

## 2021-10-17 DIAGNOSIS — Z86711 Personal history of pulmonary embolism: Secondary | ICD-10-CM | POA: Diagnosis not present

## 2021-10-17 NOTE — Progress Notes (Signed)
DISCONTINUE ON PATHWAY REGIMEN - Ovarian     A cycle is every 21 days:     Paclitaxel      Carboplatin   **Always confirm dose/schedule in your pharmacy ordering system**  REASON: Disease Progression PRIOR TREATMENT: [id number]: Carboplatin AUC=6 + Paclitaxel 175 mg/m2 q21 Days x 6 Cycles for Complete Responders or 2 Cycles Past Best Response for Partial Responders TREATMENT RESPONSE: Partial Response (PR)  START ON PATHWAY REGIMEN - Ovarian     A cycle is every 28 days:     Liposomal doxorubicin      Bevacizumab-xxxx   **Always confirm dose/schedule in your pharmacy ordering system**  Patient Characteristics: Recurrent or Progressive Disease, Second Line, Platinum Resistant or < 6 Months Since Last Therapy BRCA Mutation Status: Absent Therapeutic Status: Recurrent or Progressive Disease Line of Therapy: Second Line  Intent of Therapy: Non-Curative / Palliative Intent, Discussed with Patient

## 2021-10-17 NOTE — Telephone Encounter (Signed)
Unable to schedule ECHO and CT scan on same day. ECHO at [facility] cardiology at 12:45/1 pm and CT on 12/06 at 1:15/1:30 pm at [facility] w/no prep. Notified patient via VM and sent MyChart message.

## 2021-10-17 NOTE — Progress Notes (Signed)
[place] OFFICE PROGRESS NOTE   Diagnosis: Fallopian tube carcinoma  INTERVAL HISTORY:   Anna [name] returns for scheduled visit.  She continues to have discomfort at the right base of the skull.  The pain is worse with turning her head.  No other complaint.  No difficulty with bowel function.  No pain at the left side of the skull.  Objective:  Vital signs in last 24 hours:  Blood pressure 128/82, pulse (!) 102, temperature 98.1 F (36.7 C), temperature source Oral, resp. rate 20, height 5' 3" (1.6 m), weight 174 lb 9.6 oz (79.2 kg), SpO2 100 %.    HEENT: Neck without mass Resp: Lungs clear bilaterally Cardio: Regular rate and rhythm GI: No mass, nontender, no apparent ascites Vascular: No leg edema Musculoskeletal: No tenderness at the skull or posterior neck, no mass   Portacath/PICC-without erythema  Lab Results:  Lab Results  Component Value Date   WBC 4.3 09/18/2021   HGB 9.7 (L) 09/18/2021   HCT 30.6 (L) 09/18/2021   MCV 105.2 (H) 09/18/2021   PLT 203 09/18/2021   NEUTROABS 1.7 09/18/2021    CMP  Lab Results  Component Value Date   NA 139 10/15/2021   K 4.0 10/15/2021   CL 103 10/15/2021   CO2 26 10/15/2021   GLUCOSE 99 10/15/2021   BUN 20 10/15/2021   CREATININE 1.17 (H) 10/15/2021   CALCIUM 9.5 10/15/2021   PROT 7.2 09/18/2021   ALBUMIN 3.8 09/18/2021   AST 13 (L) 09/18/2021   ALT 12 09/18/2021   ALKPHOS 81 09/18/2021   BILITOT 0.4 09/18/2021   GFRNONAA 52 (L) 10/15/2021   GFRAA >60 08/10/2020    Lab Results  Component Value Date   CEA1 <1.00 06/27/2017    Lab Results  Component Value Date   INR 0.9 08/26/2019   LABPROT 11.8 08/26/2019    Imaging:  CT HEAD WO CONTRAST (5MM)  Result Date: 10/15/2021 CLINICAL DATA:  Right occipital skull pain. History of left fallopian tube malignancy. EXAM: CT HEAD WITHOUT CONTRAST TECHNIQUE: Contiguous axial images were obtained from the base of the skull through the vertex  without intravenous contrast. COMPARISON:  None. FINDINGS: Brain: There is no evidence of an acute infarct, intracranial hemorrhage, mass, midline shift, or extra-axial fluid collection. The ventricles and sulci are normal. Vascular: Calcified atherosclerosis at the skull base. No hyperdense vessel. Skull: No fracture. Abnormal, asymmetric lucency involving the left parietal skull over an area spanning 6.5-7 cm without gross cortical disruption. Sinuses/Orbits: Visualized paranasal sinuses and mastoid air cells are clear. Bilateral cataract extraction. Other: None. IMPRESSION: 1. Large lucency in the left parietal skull, indeterminate however osseous metastatic disease is a concern. Consider further evaluation with brain MRI without and with contrast or nuclear medicine whole-body bone scan. 2. Unremarkable CT appearance of the brain. Electronically Signed   By: Logan Bores M.D.   On: 10/15/2021 10:57   CT ABDOMEN PELVIS W CONTRAST  Result Date: 10/15/2021 CLINICAL DATA:  Left fallopian tube carcinoma. Currently undergoing chemotherapy. Restaging. EXAM: CT ABDOMEN AND PELVIS WITH CONTRAST TECHNIQUE: Multidetector CT imaging of the abdomen and pelvis was performed using the standard protocol following bolus administration of intravenous contrast. CONTRAST:  27m OMNIPAQUE IOHEXOL 300 MG/ML  SOLN COMPARISON:  AP CT on 07/06/2021, and chest CTA on 01/12/2020 FINDINGS: Lower Chest: 8 mm noncalcified pulmonary nodule is seen in the medial right middle lobe on image 2/4, and 9 mm pulmonary nodule is seen in the inferior right middle  lobe on image 8/4. Several smaller pulmonary nodules are seen in the posterior lower lobes bilaterally. These are new compared to prior studies and consistent with pulmonary metastases. Hepatobiliary: Multiple new hypovascular masses are seen in the right and left hepatic lobes, largest adjacent to the gallbladder fundus measuring 2.5 x 2.0 cm on image 30/2. These are consistent with  hepatic metastases. Mild hepatic steatosis again noted. Gallbladder is unremarkable. No evidence of biliary ductal dilatation. Pancreas:  No mass or inflammatory changes. Spleen: Within normal limits in size and appearance. Adrenals/Urinary Tract: Right upper pole renal cyst again noted. No adrenal or renal masses identified. There has been placement of a left ureteral stent since prior study, with resolution of left hydroureteronephrosis. Stomach/Bowel: No evidence of obstruction, inflammatory process or abnormal fluid collections. Vascular/Lymphatic: Several soft tissue nodules are seen in the sigmoid mesentery, largest measuring 1.0 cm short axis, which are increased in size since previous study. These may represent lymph nodes or peritoneal metastases. A 9 mm soft tissue nodule is seen in the left upper quadrant omental fat on image 30/2, which was not seen on previous study, suspicious for peritoneal metastasis. Aortic atherosclerotic calcification noted. No acute vascular findings. Reproductive: Prior hysterectomy. Soft tissue mass in the left adnexa which involves the distal left ureter shows mild increase in size, currently measuring 3.6 x 2.9 cm on image 68/2, compared to 2.7 x 2.3 cm previously. No evidence of ascites. Other: Small suprapubic ventral hernia again seen containing a small bowel loop. No evidence of bowel obstruction or strangulation. Musculoskeletal:  No suspicious bone lesions identified. IMPRESSION: New bibasilar pulmonary metastases. New hepatic metastatic disease. Mild increase in size of left adnexal mass, which involves the distal left ureter. Resolution of left hydronephrosis following ureteral stent placement. Mild increase in size of sigmoid mesenteric and left upper quadrant omental soft tissue nodules or lymph nodes, consistent with metastatic disease. Aortic Atherosclerosis (ICD10-I70.0). Electronically Signed   By: Marlaine Hind M.D.   On: 10/15/2021 12:11    Medications: I  have reviewed the patient's current medications.   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 with metastatic disease to the omentum, right fallopian tube, and bilateral ovaries,pT3,pNx, optimal debulking with small peritoneal studding of the diaphragm and a remaining thin rind of tumor at the posterior peritoneum in the pelvis Cycle 1 adjuvant Taxol/carboplatin 08/05/2017 Cycle 2 adjuvant Taxol/carboplatin 08/26/2017 Cycle 3 adjuvant Taxol/carboplatin 09/17/2017-Taxol dose reduced secondary to neuropathy and bone pain  Cycle 4 adjuvant Taxol/carboplatin 10/10/2017 Cycle 5 adjuvant Taxol/carboplatin 10/31/2017 Cycle 6 adjuvant Taxol/carboplatin 11/21/2017 CT abdomen/pelvis 08/08/2019- narrowing of rectosigmoid colon with eccentric serosal implant, 10 mm lymph node at the superior rectal vein, left upper quadrant soft tissue implant, 9 mm precaval node, 15 mm left internal iliac node, no ascites Cycle 1 Taxol/carboplatin 08/31/2019 Cycle 2 Taxol/carboplatin/Avastin 09/21/2019 Cycle 3 Taxol/Carboplatin 10/12/2019 (Avastin discontinued) Cycle 4 Taxol/carboplatin November 02, 2019 CT abdomen/pelvis 11/17/2019-decrease in size of previously prominent pericaval, left internal iliac, and superior rectal vein nodes, resolved rectosigmoid wall thickening Cycle 5 Taxol/carboplatin 11/22/2019 Cycle 6 Taxol/carboplatin 12/13/2019 Olaparib 01/05/2020 CT abdomen/pelvis 07/10/2020-enlarging soft tissue at the distal left ureter, the  ureter is not  dilated, peritoneal implant adjacent to the splenic flexure, enlarged left superior rectal node, lymph nodes along the course of the distal IMV have enlarged, eccentric thickening of the sigmoid colon not seen  Olaraparib discontinued 07/12/2020 Cycle 1 Taxol/carboplatin 07/21/2020 Cycle 2 Taxol/carboplatin 08/10/2020 (carboplatin dose reduced secondary to thrombocytopenia) Cycle 3 Taxol/carboplatin 09/05/2020 Cycle 4 Taxol/carboplatin 09/26/2020 Cycle 5 Taxol/carboplatin 10/24/2020 CT abdomen/pelvis 11/13/2020-overall stable disease, no new site of metastatic disease, generally stable small abdominal/pelvic lymph nodes, slight enlargement of a peritoneal implant in the left pelvis, slight decrease in size of another pelvic implant Cycle 6 Taxol/carboplatin 11/14/2020 Cycle 7 Taxol/carboplatin 12/05/2020 Cycle 8 Taxol/carboplatin 12/26/2020 Cycle 9 Taxol/carboplatin 01/16/2021 Cycle 10 Taxol/carboplatin 02/06/2021 Cycle 11 Taxol/carboplatin 02/27/2021 Cycle 12 Taxol/carboplatin 03/20/2021 Cycle 13 Taxol/carboplatin 04/10/2021 Cycle 14 Taxol/carboplatin 05/08/2021 Cycle 15 Taxol/carboplatin 05/29/2021 Cycle 16 Taxol/carboplatin 06/19/2021 CT abdomen/pelvis 07/06/2021-increased size of a left pelvic soft tissue mass with obstruction of the distal left ureter and new moderate left hydronephrosis, no change in mild pelvic lymphadenopathy Systemic therapy placed on hold CT abdomen/pelvis 10/15/2021-new bilateral pulmonary metastases, new hepatic metastases, mild increase in size of left adnexal mass, mild increase in size of sigmoid mesenteric and left upper quadrant omental nodules CT head 10/15/2021-large lucency in the left parietal skull concerning for a bone metastasis   Depression   3.   Hypothyroid   4.   Family history of uterine cancer-paternal grandmother   13.   Acute onset dyspnea/pleuritic left-sided chest pain 10/02/2019 CT chest 10/04/2019-left lower lobe pulmonary emboli, multifocal  pneumonia lower lobes On Xarelto, status post course of Levaquin.  6.  Acute onset left lower back pain 01/24/2021-resolved after a Medrol Dosepak  7.  Mild elevation of the creatinine, renal ultrasound 05/31/2021-mild left hydronephrosis CT 07/06/2021-new moderate left hydronephrosis, left ureter stent placed 07/18/2021    Disposition: Anna [name] has a metastatic fallopian tube carcinoma.  The restaging CTs are consistent with disease progression in multiple sites.  I reviewed the CT images and discussed treatment options with Ms. Epting.  The plan is to resume systemic therapy.  I recommend single agent Doxil.  We reviewed potential toxicities associated with Doxil including the chance of hematologic toxicity, more plantar dysesthesia, rash, alopecia, nausea, mucositis, and cardiac toxicity.  She agrees to proceed.  The plan is to begin Doxil during the week of 10/22/2021.  The etiology of the right base of the skull discomfort is unclear.  She could have a metastasis to the neck that was not seen on the head CT.  She will be referred for a CT neck.  If this study is negative and she continues to have pain we will recommend a bone scan or PET scan.  A chemotherapy plan was entered today.  [name], MD  10/17/2021  3:17 PM

## 2021-10-19 ENCOUNTER — Encounter: Payer: Self-pay | Admitting: Oncology

## 2021-10-19 ENCOUNTER — Encounter: Payer: Self-pay | Admitting: Internal Medicine

## 2021-10-19 ENCOUNTER — Other Ambulatory Visit: Payer: Self-pay

## 2021-10-19 MED ORDER — ACYCLOVIR 400 MG PO TABS: ORAL_TABLET | ORAL | 1 refills | Status: AC

## 2021-10-19 MED ORDER — EPINEPHRINE 0.3 MG/0.3ML IJ SOAJ: 0.3000 mg | INTRAMUSCULAR | 0 refills | Status: AC | PRN

## 2021-10-19 MED ORDER — LIDOCAINE-PRILOCAINE 2.5-2.5 % EX CREA: 1.0000 "application " | TOPICAL_CREAM | CUTANEOUS | 3 refills | Status: AC

## 2021-10-19 NOTE — Telephone Encounter (Signed)
Pt called inquiring if she should still get the covid booster on Monday if she is starting a new treatment. Informed Pt per Dr Benay Spice its ok to get the booster on Monday.

## 2021-10-19 NOTE — Telephone Encounter (Signed)
I have sent in the acyclovir and epi pen. Please see if prior authorization is needed.  Also, regarding prilosec - can change to protonix 40mg  q day if agreeable.  Ok to send in rx if agreeable

## 2021-10-21 ENCOUNTER — Other Ambulatory Visit: Payer: Self-pay | Admitting: Oncology

## 2021-10-22 ENCOUNTER — Other Ambulatory Visit (HOSPITAL_BASED_OUTPATIENT_CLINIC_OR_DEPARTMENT_OTHER): Payer: Self-pay

## 2021-10-22 ENCOUNTER — Ambulatory Visit (INDEPENDENT_AMBULATORY_CARE_PROVIDER_SITE_OTHER): Payer: BC Managed Care – PPO

## 2021-10-22 ENCOUNTER — Ambulatory Visit: Payer: BC Managed Care – PPO | Attending: Internal Medicine

## 2021-10-22 ENCOUNTER — Other Ambulatory Visit: Payer: Self-pay | Admitting: Oncology

## 2021-10-22 ENCOUNTER — Other Ambulatory Visit: Payer: Self-pay

## 2021-10-22 DIAGNOSIS — Z0189 Encounter for other specified special examinations: Secondary | ICD-10-CM | POA: Diagnosis not present

## 2021-10-22 DIAGNOSIS — Z23 Encounter for immunization: Secondary | ICD-10-CM

## 2021-10-22 DIAGNOSIS — C5702 Malignant neoplasm of left fallopian tube: Secondary | ICD-10-CM | POA: Diagnosis not present

## 2021-10-22 LAB — ECHOCARDIOGRAM COMPLETE
AR max vel: 2.38 cm2
AV Area VTI: 2.26 cm2
AV Area mean vel: 2.31 cm2
AV Mean grad: 3 mmHg
AV Peak grad: 5.3 mmHg
Ao pk vel: 1.15 m/s
Area-P 1/2: 3.51 cm2
Calc EF: 55.2 %
S' Lateral: 2.32 cm
Single Plane A2C EF: 58.2 %
Single Plane A4C EF: 50.9 %

## 2021-10-22 MED ORDER — PANTOPRAZOLE SODIUM 40 MG PO TBEC: 40.0000 mg | DELAYED_RELEASE_TABLET | Freq: Every day | ORAL | 3 refills | Status: DC

## 2021-10-22 MED ORDER — PFIZER COVID-19 VAC BIVALENT 30 MCG/0.3ML IM SUSP
INTRAMUSCULAR | 0 refills | Status: DC
  Filled 2021-10-22: qty 0.3, 1d supply, fill #0

## 2021-10-22 NOTE — Telephone Encounter (Signed)
Rx sent in for protonix.

## 2021-10-22 NOTE — Progress Notes (Signed)
   Covid-19 Vaccination Clinic  Name:  Anna [name]    MRN: [id number] DOB: 1956-01-05  10/22/2021  Anna [name] was observed post Covid-19 immunization for 15 minutes without incident. She was provided with Vaccine Information Sheet and instruction to access the V-Safe system.   Anna [name] was instructed to call 911 with any severe reactions post vaccine: Difficulty breathing  Swelling of face and throat  A fast heartbeat  A bad rash all over body  Dizziness and weakness   Immunizations Administered     Name Date Dose VIS Date Route   Pfizer Covid-19 Vaccine Bivalent Booster 10/22/2021  2:03 PM 0.3 mL 07/18/2021 Intramuscular   Manufacturer: [place]   Lot: [id number]   [place]: [phone]

## 2021-10-23 ENCOUNTER — Inpatient Hospital Stay: Payer: BC Managed Care – PPO

## 2021-10-23 ENCOUNTER — Inpatient Hospital Stay: Payer: BC Managed Care – PPO | Attending: Oncology

## 2021-10-23 ENCOUNTER — Ambulatory Visit (HOSPITAL_BASED_OUTPATIENT_CLINIC_OR_DEPARTMENT_OTHER)
Admission: RE | Admit: 2021-10-23 | Discharge: 2021-10-23 | Disposition: A | Discharge status: Home / Self Care | Payer: BC Managed Care – PPO | Source: Ambulatory Visit | Attending: Oncology | Admitting: Oncology

## 2021-10-23 ENCOUNTER — Other Ambulatory Visit: Payer: Self-pay

## 2021-10-23 VITALS — BP 133/82 | HR 97 | Temp 98.0°F | Resp 18 | Ht 63.0 in | Wt 175.4 lb

## 2021-10-23 DIAGNOSIS — E039 Hypothyroidism, unspecified: Secondary | ICD-10-CM | POA: Diagnosis not present

## 2021-10-23 DIAGNOSIS — C5702 Malignant neoplasm of left fallopian tube: Secondary | ICD-10-CM | POA: Diagnosis not present

## 2021-10-23 DIAGNOSIS — Z5112 Encounter for antineoplastic immunotherapy: Secondary | ICD-10-CM | POA: Insufficient documentation

## 2021-10-23 DIAGNOSIS — Z5189 Encounter for other specified aftercare: Secondary | ICD-10-CM | POA: Insufficient documentation

## 2021-10-23 DIAGNOSIS — R971 Elevated cancer antigen 125 [CA 125]: Secondary | ICD-10-CM | POA: Insufficient documentation

## 2021-10-23 DIAGNOSIS — D701 Agranulocytosis secondary to cancer chemotherapy: Secondary | ICD-10-CM | POA: Insufficient documentation

## 2021-10-23 DIAGNOSIS — F32A Depression, unspecified: Secondary | ICD-10-CM | POA: Insufficient documentation

## 2021-10-23 DIAGNOSIS — T451X5A Adverse effect of antineoplastic and immunosuppressive drugs, initial encounter: Secondary | ICD-10-CM | POA: Insufficient documentation

## 2021-10-23 DIAGNOSIS — Z5111 Encounter for antineoplastic chemotherapy: Secondary | ICD-10-CM | POA: Insufficient documentation

## 2021-10-23 DIAGNOSIS — Z79899 Other long term (current) drug therapy: Secondary | ICD-10-CM | POA: Insufficient documentation

## 2021-10-23 DIAGNOSIS — M47812 Spondylosis without myelopathy or radiculopathy, cervical region: Secondary | ICD-10-CM | POA: Insufficient documentation

## 2021-10-23 DIAGNOSIS — C5700 Malignant neoplasm of unspecified fallopian tube: Secondary | ICD-10-CM | POA: Diagnosis not present

## 2021-10-23 LAB — CBC WITH DIFFERENTIAL (CANCER CENTER ONLY)
Abs Immature Granulocytes: 0.02 10*3/uL (ref 0.00–0.07)
Basophils Absolute: 0 10*3/uL (ref 0.0–0.1)
Basophils Relative: 0 %
Eosinophils Absolute: 0 10*3/uL (ref 0.0–0.5)
Eosinophils Relative: 1 %
HCT: 33.1 % — ABNORMAL LOW (ref 36.0–46.0)
Hemoglobin: 10.5 g/dL — ABNORMAL LOW (ref 12.0–15.0)
Immature Granulocytes: 0 %
Lymphocytes Relative: 25 %
Lymphs Abs: 1.4 10*3/uL (ref 0.7–4.0)
MCH: 32.3 pg (ref 26.0–34.0)
MCHC: 31.7 g/dL (ref 30.0–36.0)
MCV: 101.8 fL — ABNORMAL HIGH (ref 80.0–100.0)
Monocytes Absolute: 0.4 10*3/uL (ref 0.1–1.0)
Monocytes Relative: 8 %
Neutro Abs: 3.7 10*3/uL (ref 1.7–7.7)
Neutrophils Relative %: 66 %
Platelet Count: 186 10*3/uL (ref 150–400)
RBC: 3.25 MIL/uL — ABNORMAL LOW (ref 3.87–5.11)
RDW: 14.6 % (ref 11.5–15.5)
WBC Count: 5.5 10*3/uL (ref 4.0–10.5)
nRBC: 0 % (ref 0.0–0.2)

## 2021-10-23 LAB — CMP (CANCER CENTER ONLY)
ALT: 15 U/L (ref 0–44)
AST: 18 U/L (ref 15–41)
Albumin: 4.1 g/dL (ref 3.5–5.0)
Alkaline Phosphatase: 110 U/L (ref 38–126)
Anion gap: 9 (ref 5–15)
BUN: 17 mg/dL (ref 8–23)
CO2: 26 mmol/L (ref 22–32)
Calcium: 9.6 mg/dL (ref 8.9–10.3)
Chloride: 105 mmol/L (ref 98–111)
Creatinine: 1.18 mg/dL — ABNORMAL HIGH (ref 0.44–1.00)
GFR, Estimated: 52 mL/min — ABNORMAL LOW (ref >60)
Glucose, Bld: 116 mg/dL — ABNORMAL HIGH (ref 70–99)
Potassium: 3.7 mmol/L (ref 3.5–5.1)
Sodium: 140 mmol/L (ref 135–145)
Total Bilirubin: 0.5 mg/dL (ref 0.3–1.2)
Total Protein: 7.1 g/dL (ref 6.5–8.1)

## 2021-10-23 LAB — TSH: TSH: 6.885 u[IU]/mL — ABNORMAL HIGH (ref 0.350–4.500)

## 2021-10-23 MED ORDER — SODIUM CHLORIDE 0.9 % IV SOLN
10.0000 mg | Freq: Once | INTRAVENOUS | Status: AC
  Administered 2021-10-23: 10 mg via INTRAVENOUS
  Filled 2021-10-23: qty 1

## 2021-10-23 MED ORDER — HEPARIN SOD (PORK) LOCK FLUSH 100 UNIT/ML IV SOLN
500.0000 [IU] | Freq: Once | INTRAVENOUS | Status: AC | PRN
  Administered 2021-10-23: 500 [IU]

## 2021-10-23 MED ORDER — DEXTROSE 5 % IV SOLN: Freq: Once | INTRAVENOUS | Status: AC

## 2021-10-23 MED ORDER — SODIUM CHLORIDE 0.9% FLUSH
10.0000 mL | INTRAVENOUS | Status: DC | PRN
  Administered 2021-10-23: 10 mL

## 2021-10-23 MED ORDER — DOXORUBICIN HCL LIPOSOMAL CHEMO INJECTION 2 MG/ML
70.0000 mg | Freq: Once | INTRAVENOUS | Status: AC
  Administered 2021-10-23: 70 mg via INTRAVENOUS
  Filled 2021-10-23: qty 25

## 2021-10-23 NOTE — Progress Notes (Signed)
Pharmacist Chemotherapy Monitoring - Initial Assessment    Anticipated start date: 10/23/21   The following has been reviewed per standard work regarding the patient's treatment regimen: The patient's diagnosis, treatment plan and drug doses, and organ/hematologic function Lab orders and baseline tests specific to treatment regimen  The treatment plan start date, drug sequencing, and pre-medications Prior authorization status  Patient's documented medication list, including drug-drug interaction screen and prescriptions for anti-emetics and supportive care specific to the treatment regimen The drug concentrations, fluid compatibility, administration routes, and timing of the medications to be used The patient's access for treatment and lifetime cumulative dose history, if applicable  The patient's medication allergies and previous infusion related reactions, if applicable   Changes made to treatment plan:  N/A  Follow up needed:  N/A   Patrica Duel, [facility], 10/23/2021  9:23 AM

## 2021-10-23 NOTE — Progress Notes (Signed)
Patient presents for treatment. RN assessment completed along with the following:  Labs/vitals reviewed - Yes, and within treatment parameters.   Weight within 10% of previous measurement - Yes Oncology Treatment Attestation completed for current therapy- Yes, on date 10/17/2021 Informed consent completed and reflects current therapy/intent - Yes, on date 10/23/2021             Provider progress note reviewed - Patient not seen by provider today. Most recent note dated 10/17/2021 reviewed. Treatment/Antibody/Supportive plan reviewed - Yes, and there are no adjustments needed for today's treatment. S&H and other orders reviewed - Yes, and there are no additional orders identified. Previous treatment date reviewed - Yes, and the appropriate amount of time has elapsed between treatments. Clinic Hand Off Received from - No.  Patient to proceed with treatment.

## 2021-10-23 NOTE — Telephone Encounter (Signed)
Please call and confirm she is on synthroid 65mcg q day.  TSH is slightly elevated.  Given when on 118mcg TSH was suppressed and on 2mcg TSH is slightly elevated, I would like to stop the 66mcg q day she is taking now.  Start synthroid 64mcg q day.  Will need f/u tsh in 6-8 weeks.  Regarding protonix, will need to see what is needed from insurance.

## 2021-10-23 NOTE — Patient Instructions (Signed)
[facility]   Discharge Instructions: Thank you for choosing [facility] to provide your oncology and hematology care.   If you have a lab appointment with the [facility], please go directly to the [facility] and check in at the registration area.   Wear comfortable clothing and clothing appropriate for easy access to any Portacath or PICC line.   We strive to give you quality time with your provider. You may need to reschedule your appointment if you arrive late (15 or more minutes).  Arriving late affects you and other patients whose appointments are after yours.  Also, if you miss three or more appointments without notifying the office, you may be dismissed from the clinic at the provider's discretion.      For prescription refill requests, have your pharmacy contact our office and allow 72 hours for refills to be completed.    Today you received the following chemotherapy and/or immunotherapy agents Doxorubicin HCL LIPOSOMAL (DOXIL).      To help prevent nausea and vomiting after your treatment, we encourage you to take your nausea medication as directed.  BELOW ARE SYMPTOMS THAT SHOULD BE REPORTED IMMEDIATELY: *FEVER GREATER THAN 100.4 F (38 C) OR HIGHER *CHILLS OR SWEATING *NAUSEA AND VOMITING THAT IS NOT CONTROLLED WITH YOUR NAUSEA MEDICATION *UNUSUAL SHORTNESS OF BREATH *UNUSUAL BRUISING OR BLEEDING *URINARY PROBLEMS (pain or burning when urinating, or frequent urination) *BOWEL PROBLEMS (unusual diarrhea, constipation, pain near the anus) TENDERNESS IN MOUTH AND THROAT WITH OR WITHOUT PRESENCE OF ULCERS (sore throat, sores in mouth, or a toothache) UNUSUAL RASH, SWELLING OR PAIN  UNUSUAL VAGINAL DISCHARGE OR ITCHING   Items with * indicate a potential emergency and should be followed up as soon as possible or go to the Emergency Department if any problems should occur.  Please show the CHEMOTHERAPY ALERT CARD or IMMUNOTHERAPY  ALERT CARD at check-in to the Emergency Department and triage nurse.  Should you have questions after your visit or need to cancel or reschedule your appointment, please contact [facility]  Dept: [phone]  and follow the prompts.  Office hours are 8:00 a.m. to 4:30 p.m. Monday - Friday. Please note that voicemails left after 4:00 p.m. may not be returned until the following business day.  We are closed weekends and major holidays. You have access to a nurse at all times for urgent questions. Please call the main number to the clinic Dept: [phone] and follow the prompts.   For any non-urgent questions, you may also contact your provider using MyChart. We now offer e-Visits for anyone 77 and older to request care online for non-urgent symptoms. For details visit [email or web address].   Also download the MyChart app! Go to the app store, search "MyChart", open the app, select [facility], and log in with your MyChart username and password.  Due to Covid, a mask is required upon entering the hospital/clinic. If you do not have a mask, one will be given to you upon arrival. For doctor visits, patients may have 1 support person aged 109 or older with them. For treatment visits, patients cannot have anyone with them due to current Covid guidelines and our immunocompromised population.   Doxorubicin Liposomal injection What is this medication? LIPOSOMAL DOXORUBICIN (LIP oh som al  dox oh ROO bi sin) is a chemotherapy drug. This medicine is used to treat many kinds of cancer like Kaposi's sarcoma, multiple myeloma, and ovarian cancer. This medicine may be  used for other purposes; ask your health care provider or pharmacist if you have questions. COMMON BRAND NAME(S): Doxil, Lipodox What should I tell my care team before I take this medication? They need to know if you have any of these conditions: blood disorders heart disease infection (especially a virus infection  such as chickenpox, cold sores, or herpes) liver disease recent or ongoing radiation therapy an unusual or allergic reaction to doxorubicin, other chemotherapy agents, soybeans, other medicines, foods, dyes, or preservatives pregnant or trying to get pregnant breast-feeding How should I use this medication? This drug is given as an infusion into a vein. It is administered in a hospital or clinic by a specially trained health care professional. If you have pain, swelling, burning or any unusual feeling around the site of your injection, tell your health care professional right away. Talk to your pediatrician regarding the use of this medicine in children. Special care may be needed. Overdosage: If you think you have taken too much of this medicine contact a poison control center or emergency room at once. NOTE: This medicine is only for you. Do not share this medicine with others. What if I miss a dose? It is important not to miss your dose. Call your doctor or health care professional if you are unable to keep an appointment. What may interact with this medication? Do not take this medicine with any of the following medications: zidovudine This medicine may also interact with the following medications: medicines to increase blood counts like filgrastim, pegfilgrastim, sargramostim vaccines Talk to your doctor or health care professional before taking any of these medicines: acetaminophen aspirin ibuprofen ketoprofen naproxen This list may not describe all possible interactions. Give your health care provider a list of all the medicines, herbs, non-prescription drugs, or dietary supplements you use. Also tell them if you smoke, drink alcohol, or use illegal drugs. Some items may interact with your medicine. What should I watch for while using this medication? Your condition will be monitored carefully while you are receiving this medicine. You may need blood work done while you are taking  this medicine. This drug may make you feel generally unwell. This is not uncommon, as chemotherapy can affect healthy cells as well as cancer cells. Report any side effects. Continue your course of treatment even though you feel ill unless your doctor tells you to stop. Your urine may turn orange-red for a few days after your dose. This is not blood. If your urine is dark or brown, call your doctor. In some cases, you may be given additional medicines to help with side effects. Follow all directions for their use. Talk to your doctor about your risk of cancer. You may be more at risk for certain types of cancers if you take this medicine. Do not become pregnant while taking this medicine or for 6 months after stopping it. Women should inform their healthcare professional if they wish to become pregnant or think they may be pregnant. Men should not father a child while taking this medicine and for 6 months after stopping it. There is a potential for serious side effects to an unborn child. Talk to your health care professional or pharmacist for more information. Do not breast-feed an infant while taking this medicine. This medicine has caused ovarian failure in some women. This medicine may make it more difficult to get pregnant. Talk to your healthcare professional if you are concerned about your fertility. This medicine has caused decreased sperm counts in some  men. This may make it more difficult to father a child. Talk to your healthcare professional if you are concerned about your fertility. This medicine may cause a decrease in Co-Enzyme Q-10. You should make sure that you get enough Co-Enzyme Q-10 while you are taking this medicine. Discuss the foods you eat and the vitamins you take with your health care professional. What side effects may I notice from receiving this medication? Side effects that you should report to your doctor or health care professional as soon as possible: allergic reactions  like skin rash, itching or hives, swelling of the face, lips, or tongue low blood counts - this medicine may decrease the number of white blood cells, red blood cells and platelets. You may be at increased risk for infections and bleeding. signs of hand-foot syndrome - tingling or burning, redness, flaking, swelling, small blisters, or small sores on the palms of your hands or the soles of your feet signs of infection - fever or chills, cough, sore throat, pain or difficulty passing urine signs of decreased platelets or bleeding - bruising, pinpoint red spots on the skin, black, tarry stools, blood in the urine signs of decreased red blood cells - unusually weak or tired, fainting spells, lightheadedness back pain, chills, facial flushing, fever, headache, tightness in the chest or throat during the infusion breathing problems chest pain fast, irregular heartbeat mouth pain, redness, sores pain, swelling, redness at site where injected pain, tingling, numbness in the hands or feet swelling of ankles, feet, or hands vomiting Side effects that usually do not require medical attention (report to your doctor or health care professional if they continue or are bothersome): diarrhea hair loss loss of appetite nail discoloration or damage nausea red or watery eyes red colored urine stomach upset This list may not describe all possible side effects. Call your doctor for medical advice about side effects. You may report side effects to FDA at 1-800-FDA-1088. Where should I keep my medication? This drug is given in a hospital or clinic and will not be stored at home. NOTE: This sheet is a summary. It may not cover all possible information. If you have questions about this medicine, talk to your doctor, pharmacist, or health care provider.  2022 Elsevier/Gold Standard (2018-07-15 00:00:00)

## 2021-10-24 ENCOUNTER — Telehealth: Payer: Self-pay

## 2021-10-24 ENCOUNTER — Other Ambulatory Visit: Payer: Self-pay | Admitting: Internal Medicine

## 2021-10-24 LAB — CA 125: Cancer Antigen (CA) 125: 58.2 U/mL — ABNORMAL HIGH (ref 0.0–38.1)

## 2021-10-24 NOTE — Telephone Encounter (Signed)
Called patient for first time chemo follow-up.  Left voice message requesting patient to call office with any questions or concerns.  Reminded patient of next scheduled visit.

## 2021-10-25 MED ORDER — LEVOTHYROXINE SODIUM 88 MCG PO TABS: 88.0000 ug | ORAL_TABLET | Freq: Every day | ORAL | 3 refills | Status: AC

## 2021-10-25 NOTE — Addendum Note (Signed)
Addended by: Nanci Pina on: 10/25/2021 12:22 PM   Modules accepted: Orders

## 2021-10-25 NOTE — Telephone Encounter (Signed)
Labs sent needs [facility] for protonix.and levothyroxine sent to pharmacy

## 2021-11-12 ENCOUNTER — Other Ambulatory Visit: Payer: Self-pay | Admitting: Oncology

## 2021-11-14 ENCOUNTER — Inpatient Hospital Stay: Payer: BC Managed Care – PPO

## 2021-11-14 ENCOUNTER — Other Ambulatory Visit: Payer: Self-pay

## 2021-11-14 ENCOUNTER — Inpatient Hospital Stay (HOSPITAL_BASED_OUTPATIENT_CLINIC_OR_DEPARTMENT_OTHER): Payer: BC Managed Care – PPO | Admitting: Oncology

## 2021-11-14 VITALS — BP 135/90 | HR 100 | Temp 98.1°F | Resp 18 | Ht 63.0 in | Wt 175.2 lb

## 2021-11-14 DIAGNOSIS — C5702 Malignant neoplasm of left fallopian tube: Secondary | ICD-10-CM | POA: Diagnosis not present

## 2021-11-14 DIAGNOSIS — E039 Hypothyroidism, unspecified: Secondary | ICD-10-CM | POA: Insufficient documentation

## 2021-11-14 DIAGNOSIS — D701 Agranulocytosis secondary to cancer chemotherapy: Secondary | ICD-10-CM | POA: Insufficient documentation

## 2021-11-14 DIAGNOSIS — Z5111 Encounter for antineoplastic chemotherapy: Secondary | ICD-10-CM | POA: Diagnosis not present

## 2021-11-14 DIAGNOSIS — Z95828 Presence of other vascular implants and grafts: Secondary | ICD-10-CM

## 2021-11-14 DIAGNOSIS — Z79899 Other long term (current) drug therapy: Secondary | ICD-10-CM | POA: Diagnosis not present

## 2021-11-14 DIAGNOSIS — R971 Elevated cancer antigen 125 [CA 125]: Secondary | ICD-10-CM | POA: Diagnosis not present

## 2021-11-14 DIAGNOSIS — F32A Depression, unspecified: Secondary | ICD-10-CM | POA: Diagnosis not present

## 2021-11-14 DIAGNOSIS — Z5112 Encounter for antineoplastic immunotherapy: Secondary | ICD-10-CM | POA: Insufficient documentation

## 2021-11-14 DIAGNOSIS — T451X5A Adverse effect of antineoplastic and immunosuppressive drugs, initial encounter: Secondary | ICD-10-CM | POA: Insufficient documentation

## 2021-11-14 DIAGNOSIS — Z5189 Encounter for other specified aftercare: Secondary | ICD-10-CM | POA: Diagnosis not present

## 2021-11-14 LAB — CMP (CANCER CENTER ONLY)
ALT: 13 U/L (ref 0–44)
AST: 17 U/L (ref 15–41)
Albumin: 3.8 g/dL (ref 3.5–5.0)
Alkaline Phosphatase: 96 U/L (ref 38–126)
Anion gap: 7 (ref 5–15)
BUN: 16 mg/dL (ref 8–23)
CO2: 27 mmol/L (ref 22–32)
Calcium: 8.9 mg/dL (ref 8.9–10.3)
Chloride: 105 mmol/L (ref 98–111)
Creatinine: 1.1 mg/dL — ABNORMAL HIGH (ref 0.44–1.00)
GFR, Estimated: 56 mL/min — ABNORMAL LOW (ref >60)
Glucose, Bld: 94 mg/dL (ref 70–99)
Potassium: 3.7 mmol/L (ref 3.5–5.1)
Sodium: 139 mmol/L (ref 135–145)
Total Bilirubin: 0.3 mg/dL (ref 0.3–1.2)
Total Protein: 6.9 g/dL (ref 6.5–8.1)

## 2021-11-14 LAB — CBC WITH DIFFERENTIAL (CANCER CENTER ONLY)
Abs Immature Granulocytes: 0.01 10*3/uL (ref 0.00–0.07)
Basophils Absolute: 0 10*3/uL (ref 0.0–0.1)
Basophils Relative: 1 %
Eosinophils Absolute: 0 10*3/uL (ref 0.0–0.5)
Eosinophils Relative: 1 %
HCT: 31.5 % — ABNORMAL LOW (ref 36.0–46.0)
Hemoglobin: 10 g/dL — ABNORMAL LOW (ref 12.0–15.0)
Immature Granulocytes: 0 %
Lymphocytes Relative: 68 %
Lymphs Abs: 2.6 10*3/uL (ref 0.7–4.0)
MCH: 32.2 pg (ref 26.0–34.0)
MCHC: 31.7 g/dL (ref 30.0–36.0)
MCV: 101.3 fL — ABNORMAL HIGH (ref 80.0–100.0)
Monocytes Absolute: 0.4 10*3/uL (ref 0.1–1.0)
Monocytes Relative: 10 %
Neutro Abs: 0.7 10*3/uL — ABNORMAL LOW (ref 1.7–7.7)
Neutrophils Relative %: 20 %
Platelet Count: 159 10*3/uL (ref 150–400)
RBC: 3.11 MIL/uL — ABNORMAL LOW (ref 3.87–5.11)
RDW: 15.8 % — ABNORMAL HIGH (ref 11.5–15.5)
WBC Count: 3.8 10*3/uL — ABNORMAL LOW (ref 4.0–10.5)
nRBC: 0 % (ref 0.0–0.2)

## 2021-11-14 MED ORDER — HEPARIN SOD (PORK) LOCK FLUSH 100 UNIT/ML IV SOLN
500.0000 [IU] | Freq: Once | INTRAVENOUS | Status: AC | PRN
  Administered 2021-11-14: 12:00:00 500 [IU]

## 2021-11-14 MED ORDER — SODIUM CHLORIDE 0.9% FLUSH
10.0000 mL | INTRAVENOUS | Status: DC | PRN
  Administered 2021-11-14: 11:00:00 10 mL

## 2021-11-14 NOTE — Patient Instructions (Signed)

## 2021-11-14 NOTE — Progress Notes (Signed)
[facility] OFFICE PROGRESS NOTE   Diagnosis: Fallopian tube carcinoma  INTERVAL HISTORY:   Anna [name] completed a cycle of Doxil on 10/23/2021.  No nausea/vomiting, mouth sores, or rash.  She reports mild discomfort in the palms relieved with a moisturizer.  No difficulty with bowel function.  No abdominal pain.  Objective:  Vital signs in last 24 hours:  Blood pressure 135/90, pulse 100, temperature 98.1 F (36.7 C), temperature source Oral, resp. rate 18, height 5' 3"  (1.6 m), weight 175 lb 3.2 oz (79.5 kg), SpO2 100 %.    HEENT: No thrush or ulcers Resp: Lungs clear bilaterally Cardio: Regular rate and rhythm GI: No mass, no apparent ascites, no hepatosplenomegaly Vascular: No leg edema  Skin: No rash, palms without erythema  Portacath/PICC-without erythema  Lab Results:  Lab Results  Component Value Date   WBC 3.8 (L) 11/14/2021   HGB 10.0 (L) 11/14/2021   HCT 31.5 (L) 11/14/2021   MCV 101.3 (H) 11/14/2021   PLT 159 11/14/2021   NEUTROABS 0.7 (L) 11/14/2021    CMP  Lab Results  Component Value Date   NA 139 11/14/2021   K 3.7 11/14/2021   CL 105 11/14/2021   CO2 27 11/14/2021   GLUCOSE 94 11/14/2021   BUN 16 11/14/2021   CREATININE 1.10 (H) 11/14/2021   CALCIUM 8.9 11/14/2021   PROT 6.9 11/14/2021   ALBUMIN 3.8 11/14/2021   AST 17 11/14/2021   ALT 13 11/14/2021   ALKPHOS 96 11/14/2021   BILITOT 0.3 11/14/2021   GFRNONAA 56 (L) 11/14/2021   GFRAA >60 08/10/2020    Lab Results  Component Value Date   CEA1 <1.00 06/27/2017    Lab Results  Component Value Date   INR 0.9 08/26/2019   LABPROT 11.8 08/26/2019    Imaging:  No results found.  Medications: I have reviewed the patient's current medications.   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 omentum, right fallopian tube, and bilateral ovaries,pT3,pNx, optimal debulking with small peritoneal studding of the diaphragm and a remaining thin rind of tumor at the posterior peritoneum in the pelvis Cycle 1 adjuvant Taxol/carboplatin 08/05/2017 Cycle 2 adjuvant Taxol/carboplatin 08/26/2017 Cycle 3 adjuvant Taxol/carboplatin 09/17/2017-Taxol dose reduced secondary to neuropathy and bone pain  Cycle 4 adjuvant Taxol/carboplatin 10/10/2017 Cycle 5 adjuvant Taxol/carboplatin 10/31/2017 Cycle 6 adjuvant Taxol/carboplatin 11/21/2017 CT abdomen/pelvis 08/08/2019- narrowing of rectosigmoid colon with eccentric serosal implant, 10 mm lymph node at the superior rectal vein, left upper quadrant soft tissue implant, 9 mm precaval node, 15 mm left internal iliac node, no ascites Cycle 1 Taxol/carboplatin 08/31/2019 Cycle 2 Taxol/carboplatin/Avastin 09/21/2019 Cycle 3 Taxol/Carboplatin 10/12/2019 (Avastin discontinued) Cycle 4 Taxol/carboplatin November 02, 2019 CT abdomen/pelvis 11/17/2019-decrease in size of previously prominent pericaval, left internal iliac, and superior rectal vein nodes, resolved rectosigmoid wall thickening Cycle 5 Taxol/carboplatin 11/22/2019 Cycle 6 Taxol/carboplatin 12/13/2019 Olaparib 01/05/2020 CT abdomen/pelvis 07/10/2020-enlarging soft tissue at the distal left ureter, the ureter is not dilated, peritoneal implant adjacent to the splenic flexure, enlarged left superior rectal node, lymph nodes along the course of the distal IMV have enlarged, eccentric thickening of the sigmoid colon not  seen  Olaraparib discontinued 07/12/2020 Cycle 1  Taxol/carboplatin 07/21/2020 Cycle 2 Taxol/carboplatin 08/10/2020 (carboplatin dose reduced secondary to thrombocytopenia) Cycle 3 Taxol/carboplatin 09/05/2020 Cycle 4 Taxol/carboplatin 09/26/2020 Cycle 5 Taxol/carboplatin 10/24/2020 CT abdomen/pelvis 11/13/2020-overall stable disease, no new site of metastatic disease, generally stable small abdominal/pelvic lymph nodes, slight enlargement of a peritoneal implant in the left pelvis, slight decrease in size of another pelvic implant Cycle 6 Taxol/carboplatin 11/14/2020 Cycle 7 Taxol/carboplatin 12/05/2020 Cycle 8 Taxol/carboplatin 12/26/2020 Cycle 9 Taxol/carboplatin 01/16/2021 Cycle 10 Taxol/carboplatin 02/06/2021 Cycle 11 Taxol/carboplatin 02/27/2021 Cycle 12 Taxol/carboplatin 03/20/2021 Cycle 13 Taxol/carboplatin 04/10/2021 Cycle 14 Taxol/carboplatin 05/08/2021 Cycle 15 Taxol/carboplatin 05/29/2021 Cycle 16 Taxol/carboplatin 06/19/2021 CT abdomen/pelvis 07/06/2021-increased size of a left pelvic soft tissue mass with obstruction of the distal left ureter and new moderate left hydronephrosis, no change in mild pelvic lymphadenopathy Systemic therapy placed on hold CT abdomen/pelvis 10/15/2021-new bilateral pulmonary metastases, new hepatic metastases, mild increase in size of left adnexal mass, mild increase in size of sigmoid mesenteric and left upper quadrant omental nodules CT head 10/15/2021-large lucency in the left parietal skull concerning for a bone metastasis Cycle 1 Doxil 10/23/2021    Depression   3.   Hypothyroid   4.   Family history of uterine cancer-paternal grandmother   43.   Acute onset dyspnea/pleuritic left-sided chest pain 10/02/2019 CT chest 10/04/2019-left lower lobe pulmonary emboli, multifocal pneumonia lower lobes On Xarelto, status post course of Levaquin.  6.  Acute onset left lower back pain 01/24/2021-resolved after a Medrol Dosepak  7.  Mild elevation of the creatinine, renal ultrasound 05/31/2021-mild left  hydronephrosis CT 07/06/2021-new moderate left hydronephrosis, left ureter stent placed 07/18/2021 8.  Neutropenia following cycle 1 Doxil-Udenyca added with cycle 2      Disposition: Anna [name] appears stable.  She tolerated the first cycle of Doxil well.  She has neutropenia today.  She would like to complete cycle 2 Doxil during the 2022 calendar year at secondary to an issue with insurance coverage.  She will return on 11/16/2021 with the planned to receive cycle 2 Doxil.  Chemotherapy will be given if the [place] is 1.0 or greater.  I dose reduce the Doxil and added Udenyca with this cycle.  Bevacizumab will be added to the chemotherapy regimen beginning with cycle 2.  We reviewed potential toxicities associated with bevacizumab including the chance of allergic reaction, thromboembolic disease, bowel perforation, nephrotoxicity, CNS toxicity, bleeding, and delayed wound healing.  She agrees to proceed.  She has a history of pulmonary embolism and is now maintained on Xarelto anticoagulation.  She will be scheduled for Avastin on 11/16/2021.  Anna [name] will return for an office visit and the next cycle of Doxil/bevacizumab on 12/20/2021.  [name], MD  11/14/2021  1:54 PM

## 2021-11-14 NOTE — Addendum Note (Signed)
Addended by: Lenox Ponds E on: 11/14/2021 12:23 PM   Modules accepted: Orders

## 2021-11-15 LAB — CA 125: Cancer Antigen (CA) 125: 37.4 U/mL (ref 0.0–38.1)

## 2021-11-16 ENCOUNTER — Other Ambulatory Visit: Payer: Self-pay

## 2021-11-16 ENCOUNTER — Inpatient Hospital Stay: Payer: BC Managed Care – PPO

## 2021-11-16 VITALS — BP 118/73 | HR 86 | Temp 98.2°F | Resp 18

## 2021-11-16 DIAGNOSIS — Z79899 Other long term (current) drug therapy: Secondary | ICD-10-CM | POA: Diagnosis not present

## 2021-11-16 DIAGNOSIS — R971 Elevated cancer antigen 125 [CA 125]: Secondary | ICD-10-CM | POA: Diagnosis not present

## 2021-11-16 DIAGNOSIS — T451X5A Adverse effect of antineoplastic and immunosuppressive drugs, initial encounter: Secondary | ICD-10-CM | POA: Diagnosis not present

## 2021-11-16 DIAGNOSIS — C5702 Malignant neoplasm of left fallopian tube: Secondary | ICD-10-CM

## 2021-11-16 DIAGNOSIS — Z5189 Encounter for other specified aftercare: Secondary | ICD-10-CM | POA: Diagnosis not present

## 2021-11-16 DIAGNOSIS — D701 Agranulocytosis secondary to cancer chemotherapy: Secondary | ICD-10-CM | POA: Diagnosis not present

## 2021-11-16 DIAGNOSIS — F32A Depression, unspecified: Secondary | ICD-10-CM | POA: Diagnosis not present

## 2021-11-16 DIAGNOSIS — Z5112 Encounter for antineoplastic immunotherapy: Secondary | ICD-10-CM | POA: Diagnosis not present

## 2021-11-16 DIAGNOSIS — Z5111 Encounter for antineoplastic chemotherapy: Secondary | ICD-10-CM | POA: Diagnosis not present

## 2021-11-16 DIAGNOSIS — E039 Hypothyroidism, unspecified: Secondary | ICD-10-CM | POA: Diagnosis not present

## 2021-11-16 LAB — CBC WITH DIFFERENTIAL (CANCER CENTER ONLY)
Abs Immature Granulocytes: 0.02 10*3/uL (ref 0.00–0.07)
Basophils Absolute: 0 10*3/uL (ref 0.0–0.1)
Basophils Relative: 0 %
Eosinophils Absolute: 0 10*3/uL (ref 0.0–0.5)
Eosinophils Relative: 1 %
HCT: 30.7 % — ABNORMAL LOW (ref 36.0–46.0)
Hemoglobin: 9.8 g/dL — ABNORMAL LOW (ref 12.0–15.0)
Immature Granulocytes: 1 %
Lymphocytes Relative: 64 %
Lymphs Abs: 2.9 10*3/uL (ref 0.7–4.0)
MCH: 32.1 pg (ref 26.0–34.0)
MCHC: 31.9 g/dL (ref 30.0–36.0)
MCV: 100.7 fL — ABNORMAL HIGH (ref 80.0–100.0)
Monocytes Absolute: 0.6 10*3/uL (ref 0.1–1.0)
Monocytes Relative: 12 %
Neutro Abs: 1 10*3/uL — ABNORMAL LOW (ref 1.7–7.7)
Neutrophils Relative %: 22 %
Platelet Count: 180 10*3/uL (ref 150–400)
RBC: 3.05 MIL/uL — ABNORMAL LOW (ref 3.87–5.11)
RDW: 16.1 % — ABNORMAL HIGH (ref 11.5–15.5)
WBC Count: 4.4 10*3/uL (ref 4.0–10.5)
nRBC: 0 % (ref 0.0–0.2)

## 2021-11-16 MED ORDER — SODIUM CHLORIDE 0.9 % IV SOLN
10.0000 mg | Freq: Once | INTRAVENOUS | Status: AC
  Administered 2021-11-16: 09:00:00 10 mg via INTRAVENOUS
  Filled 2021-11-16: qty 1

## 2021-11-16 MED ORDER — HEPARIN SOD (PORK) LOCK FLUSH 100 UNIT/ML IV SOLN
500.0000 [IU] | Freq: Once | INTRAVENOUS | Status: AC | PRN
  Administered 2021-11-16: 11:00:00 500 [IU]

## 2021-11-16 MED ORDER — DEXTROSE 5 % IV SOLN: Freq: Once | INTRAVENOUS | Status: AC

## 2021-11-16 MED ORDER — SODIUM CHLORIDE 0.9 % IV SOLN: Freq: Once | INTRAVENOUS | Status: AC

## 2021-11-16 MED ORDER — DOXORUBICIN HCL LIPOSOMAL CHEMO INJECTION 2 MG/ML
30.0000 mg/m2 | Freq: Once | INTRAVENOUS | Status: AC
  Administered 2021-11-16: 10:00:00 56 mg via INTRAVENOUS
  Filled 2021-11-16: qty 25

## 2021-11-16 MED ORDER — SODIUM CHLORIDE 0.9 % IV SOLN
10.0000 mg/kg | Freq: Once | INTRAVENOUS | Status: AC
  Administered 2021-11-16: 09:00:00 800 mg via INTRAVENOUS
  Filled 2021-11-16: qty 32

## 2021-11-16 MED ORDER — SODIUM CHLORIDE 0.9% FLUSH
10.0000 mL | INTRAVENOUS | Status: DC | PRN
  Administered 2021-11-16: 11:00:00 10 mL

## 2021-11-16 NOTE — Progress Notes (Signed)
Patient ANC 1.0 today. Per MD, ok to give Doxil. No UA needed for Avastin today. Patient to receive [facility] tomorrow.

## 2021-11-16 NOTE — Patient Instructions (Signed)
[facility]  Discharge Instructions: Thank you for choosing [facility] to provide your oncology and hematology care.   If you have a lab appointment with the [facility], please go directly to the [facility] and check in at the registration area.   Wear comfortable clothing and clothing appropriate for easy access to any Portacath or PICC line.   We strive to give you quality time with your provider. You may need to reschedule your appointment if you arrive late (15 or more minutes).  Arriving late affects you and other patients whose appointments are after yours.  Also, if you miss three or more appointments without notifying the office, you may be dismissed from the clinic at the providers discretion.      For prescription refill requests, have your pharmacy contact our office and allow 72 hours for refills to be completed.    Today you received the following chemotherapy and/or immunotherapy agents Avastin/Doxil  Bevacizumab injection What is this medication? BEVACIZUMAB (be va SIZ yoo mab) is a monoclonal antibody. It is used to treat many types of cancer. This medicine may be used for other purposes; ask your health care provider or pharmacist if you have questions. COMMON BRAND NAME(S): Alymsys, Avastin, MVASI, Noah Charon What should I tell my care team before I take this medication? They need to know if you have any of these conditions: diabetes heart disease high blood pressure history of coughing up blood prior anthracycline chemotherapy (e.g., doxorubicin, daunorubicin, epirubicin) recent or ongoing radiation therapy recent or planning to have surgery stroke an unusual or allergic reaction to bevacizumab, hamster proteins, mouse proteins, other medicines, foods, dyes, or preservatives pregnant or trying to get pregnant breast-feeding How should I use this medication? This medicine is for infusion into a vein. It is given by a  health care professional in a hospital or clinic setting. Talk to your pediatrician regarding the use of this medicine in children. Special care may be needed. Overdosage: If you think you have taken too much of this medicine contact a poison control center or emergency room at once. NOTE: This medicine is only for you. Do not share this medicine with others. What if I miss a dose? It is important not to miss your dose. Call your doctor or health care professional if you are unable to keep an appointment. What may interact with this medication? Interactions are not expected. This list may not describe all possible interactions. Give your health care provider a list of all the medicines, herbs, non-prescription drugs, or dietary supplements you use. Also tell them if you smoke, drink alcohol, or use illegal drugs. Some items may interact with your medicine. What should I watch for while using this medication? Your condition will be monitored carefully while you are receiving this medicine. You will need important blood work and urine testing done while you are taking this medicine. This medicine may increase your risk to bruise or bleed. Call your doctor or health care professional if you notice any unusual bleeding. Before having surgery, talk to your health care provider to make sure it is ok. This drug can increase the risk of poor healing of your surgical site or wound. You will need to stop this drug for 28 days before surgery. After surgery, wait at least 28 days before restarting this drug. Make sure the surgical site or wound is healed enough before restarting this drug. Talk to your health care provider if questions. Do  not become pregnant while taking this medicine or for 6 months after stopping it. Women should inform their doctor if they wish to become pregnant or think they might be pregnant. There is a potential for serious side effects to an unborn child. Talk to your health care  professional or pharmacist for more information. Do not breast-feed an infant while taking this medicine and for 6 months after the last dose. This medicine has caused ovarian failure in some women. This medicine may interfere with the ability to have a child. You should talk to your doctor or health care professional if you are concerned about your fertility. What side effects may I notice from receiving this medication? Side effects that you should report to your doctor or health care professional as soon as possible: allergic reactions like skin rash, itching or hives, swelling of the face, lips, or tongue chest pain or chest tightness chills coughing up blood high fever seizures severe constipation signs and symptoms of bleeding such as bloody or black, tarry stools; red or dark-brown urine; spitting up blood or brown material that looks like coffee grounds; red spots on the skin; unusual bruising or bleeding from the eye, gums, or nose signs and symptoms of a blood clot such as breathing problems; chest pain; severe, sudden headache; pain, swelling, warmth in the leg signs and symptoms of a stroke like changes in vision; confusion; trouble speaking or understanding; severe headaches; sudden numbness or weakness of the face, arm or leg; trouble walking; dizziness; loss of balance or coordination stomach pain sweating swelling of legs or ankles vomiting weight gain Side effects that usually do not require medical attention (report to your doctor or health care professional if they continue or are bothersome): back pain changes in taste decreased appetite dry skin nausea tiredness This list may not describe all possible side effects. Call your doctor for medical advice about side effects. You may report side effects to FDA at 1-800-FDA-1088. Where should I keep my medication? This drug is given in a hospital or clinic and will not be stored at home. NOTE: This sheet is a summary. It  may not cover all possible information. If you have questions about this medicine, talk to your doctor, pharmacist, or health care provider.  2022 Elsevier/Gold Standard (2021-07-24 00:00:00)  Doxorubicin Liposomal injection What is this medication? LIPOSOMAL DOXORUBICIN (LIP oh som al  dox oh ROO bi sin) is a chemotherapy drug. This medicine is used to treat many kinds of cancer like Kaposi's sarcoma, multiple myeloma, and ovarian cancer. This medicine may be used for other purposes; ask your health care provider or pharmacist if you have questions. COMMON BRAND NAME(S): Doxil, Lipodox What should I tell my care team before I take this medication? They need to know if you have any of these conditions: blood disorders heart disease infection (especially a virus infection such as chickenpox, cold sores, or herpes) liver disease recent or ongoing radiation therapy an unusual or allergic reaction to doxorubicin, other chemotherapy agents, soybeans, other medicines, foods, dyes, or preservatives pregnant or trying to get pregnant breast-feeding How should I use this medication? This drug is given as an infusion into a vein. It is administered in a hospital or clinic by a specially trained health care professional. If you have pain, swelling, burning or any unusual feeling around the site of your injection, tell your health care professional right away. Talk to your pediatrician regarding the use of this medicine in children. Special care may be  needed. Overdosage: If you think you have taken too much of this medicine contact a poison control center or emergency room at once. NOTE: This medicine is only for you. Do not share this medicine with others. What if I miss a dose? It is important not to miss your dose. Call your doctor or health care professional if you are unable to keep an appointment. What may interact with this medication? Do not take this medicine with any of the following  medications: zidovudine This medicine may also interact with the following medications: medicines to increase blood counts like filgrastim, pegfilgrastim, sargramostim vaccines Talk to your doctor or health care professional before taking any of these medicines: acetaminophen aspirin ibuprofen ketoprofen naproxen This list may not describe all possible interactions. Give your health care provider a list of all the medicines, herbs, non-prescription drugs, or dietary supplements you use. Also tell them if you smoke, drink alcohol, or use illegal drugs. Some items may interact with your medicine. What should I watch for while using this medication? Your condition will be monitored carefully while you are receiving this medicine. You may need blood work done while you are taking this medicine. This drug may make you feel generally unwell. This is not uncommon, as chemotherapy can affect healthy cells as well as cancer cells. Report any side effects. Continue your course of treatment even though you feel ill unless your doctor tells you to stop. Your urine may turn orange-red for a few days after your dose. This is not blood. If your urine is dark or brown, call your doctor. In some cases, you may be given additional medicines to help with side effects. Follow all directions for their use. Talk to your doctor about your risk of cancer. You may be more at risk for certain types of cancers if you take this medicine. Do not become pregnant while taking this medicine or for 6 months after stopping it. Women should inform their healthcare professional if they wish to become pregnant or think they may be pregnant. Men should not father a child while taking this medicine and for 6 months after stopping it. There is a potential for serious side effects to an unborn child. Talk to your health care professional or pharmacist for more information. Do not breast-feed an infant while taking this medicine. This  medicine has caused ovarian failure in some women. This medicine may make it more difficult to get pregnant. Talk to your healthcare professional if you are concerned about your fertility. This medicine has caused decreased sperm counts in some men. This may make it more difficult to father a child. Talk to your healthcare professional if you are concerned about your fertility. This medicine may cause a decrease in Co-Enzyme Q-10. You should make sure that you get enough Co-Enzyme Q-10 while you are taking this medicine. Discuss the foods you eat and the vitamins you take with your health care professional. What side effects may I notice from receiving this medication? Side effects that you should report to your doctor or health care professional as soon as possible: allergic reactions like skin rash, itching or hives, swelling of the face, lips, or tongue low blood counts - this medicine may decrease the number of white blood cells, red blood cells and platelets. You may be at increased risk for infections and bleeding. signs of hand-foot syndrome - tingling or burning, redness, flaking, swelling, small blisters, or small sores on the palms of your hands or the soles of  your feet signs of infection - fever or chills, cough, sore throat, pain or difficulty passing urine signs of decreased platelets or bleeding - bruising, pinpoint red spots on the skin, black, tarry stools, blood in the urine signs of decreased red blood cells - unusually weak or tired, fainting spells, lightheadedness back pain, chills, facial flushing, fever, headache, tightness in the chest or throat during the infusion breathing problems chest pain fast, irregular heartbeat mouth pain, redness, sores pain, swelling, redness at site where injected pain, tingling, numbness in the hands or feet swelling of ankles, feet, or hands vomiting Side effects that usually do not require medical attention (report to your doctor or health  care professional if they continue or are bothersome): diarrhea hair loss loss of appetite nail discoloration or damage nausea red or watery eyes red colored urine stomach upset This list may not describe all possible side effects. Call your doctor for medical advice about side effects. You may report side effects to FDA at 1-800-FDA-1088. Where should I keep my medication? This drug is given in a hospital or clinic and will not be stored at home. NOTE: This sheet is a summary. It may not cover all possible information. If you have questions about this medicine, talk to your doctor, pharmacist, or health care provider.  2022 Elsevier/Gold Standard (2018-07-15 00:00:00)        To help prevent nausea and vomiting after your treatment, we encourage you to take your nausea medication as directed.  BELOW ARE SYMPTOMS THAT SHOULD BE REPORTED IMMEDIATELY: *FEVER GREATER THAN 100.4 F (38 C) OR HIGHER *CHILLS OR SWEATING *NAUSEA AND VOMITING THAT IS NOT CONTROLLED WITH YOUR NAUSEA MEDICATION *UNUSUAL SHORTNESS OF BREATH *UNUSUAL BRUISING OR BLEEDING *URINARY PROBLEMS (pain or burning when urinating, or frequent urination) *BOWEL PROBLEMS (unusual diarrhea, constipation, pain near the anus) TENDERNESS IN MOUTH AND THROAT WITH OR WITHOUT PRESENCE OF ULCERS (sore throat, sores in mouth, or a toothache) UNUSUAL RASH, SWELLING OR PAIN  UNUSUAL VAGINAL DISCHARGE OR ITCHING   Items with * indicate a potential emergency and should be followed up as soon as possible or go to the Emergency Department if any problems should occur.  Please show the CHEMOTHERAPY ALERT CARD or IMMUNOTHERAPY ALERT CARD at check-in to the Emergency Department and triage nurse.  Should you have questions after your visit or need to cancel or reschedule your appointment, please contact [facility]  Dept: [phone]  and follow the prompts.  Office hours are 8:00 a.m. to 4:30 p.m. Monday -  Friday. Please note that voicemails left after 4:00 p.m. may not be returned until the following business day.  We are closed weekends and major holidays. You have access to a nurse at all times for urgent questions. Please call the main number to the clinic Dept: [phone] and follow the prompts.   For any non-urgent questions, you may also contact your provider using MyChart. We now offer e-Visits for anyone 52 and older to request care online for non-urgent symptoms. For details visit [email or web address].   Also download the MyChart app! Go to the app store, search "MyChart", open the app, select [facility], and log in with your MyChart username and password.  Due to Covid, a mask is required upon entering the hospital/clinic. If you do not have a mask, one will be given to you upon arrival. For doctor visits, patients may have 1 support person aged 65 or older with them. For treatment visits, patients cannot have  anyone with them due to current Covid guidelines and our immunocompromised population.

## 2021-11-16 NOTE — Progress Notes (Signed)
Patient presents for treatment. RN assessment completed along with the following:  Labs/vitals reviewed - Yes, and ANC 1.0. Per Dr. Benay Spice via phone call it is okay to administer Doxil today along with Avastin. Pt will receive udenyca on Saturday 11/17/2021 @ [facility].  Weight within 10% of previous measurement - Yes Oncology Treatment Attestation completed for current therapy- Yes, on date 10/17/2021 Informed consent completed and reflects current therapy/intent - 10/23/2021            Provider progress note reviewed - Patient not seen by provider today. Most recent note dated 11/14/2021 reviewed. Treatment/Antibody/Supportive plan reviewed - Yes, and there are no adjustments needed for today's treatment. S&H and other orders reviewed - Yes, and there are no additional orders identified. Previous treatment date reviewed - Yes, and the appropriate amount of time has elapsed between treatments.   Patient to proceed with treatment.

## 2021-11-17 ENCOUNTER — Other Ambulatory Visit: Payer: Self-pay

## 2021-11-17 ENCOUNTER — Inpatient Hospital Stay: Payer: BC Managed Care – PPO

## 2021-11-17 VITALS — BP 130/90 | HR 93 | Temp 98.3°F | Resp 20

## 2021-11-17 DIAGNOSIS — Z5111 Encounter for antineoplastic chemotherapy: Secondary | ICD-10-CM | POA: Diagnosis not present

## 2021-11-17 DIAGNOSIS — Z79899 Other long term (current) drug therapy: Secondary | ICD-10-CM | POA: Diagnosis not present

## 2021-11-17 DIAGNOSIS — Z5189 Encounter for other specified aftercare: Secondary | ICD-10-CM | POA: Diagnosis not present

## 2021-11-17 DIAGNOSIS — C5702 Malignant neoplasm of left fallopian tube: Secondary | ICD-10-CM

## 2021-11-17 DIAGNOSIS — Z5112 Encounter for antineoplastic immunotherapy: Secondary | ICD-10-CM | POA: Diagnosis not present

## 2021-11-17 DIAGNOSIS — F32A Depression, unspecified: Secondary | ICD-10-CM | POA: Diagnosis not present

## 2021-11-17 DIAGNOSIS — T451X5A Adverse effect of antineoplastic and immunosuppressive drugs, initial encounter: Secondary | ICD-10-CM | POA: Diagnosis not present

## 2021-11-17 DIAGNOSIS — D701 Agranulocytosis secondary to cancer chemotherapy: Secondary | ICD-10-CM | POA: Diagnosis not present

## 2021-11-17 DIAGNOSIS — R971 Elevated cancer antigen 125 [CA 125]: Secondary | ICD-10-CM | POA: Diagnosis not present

## 2021-11-17 DIAGNOSIS — E039 Hypothyroidism, unspecified: Secondary | ICD-10-CM | POA: Diagnosis not present

## 2021-11-17 MED ORDER — PEGFILGRASTIM-CBQV 6 MG/0.6ML ~~LOC~~ SOSY
6.0000 mg | PREFILLED_SYRINGE | Freq: Once | SUBCUTANEOUS | Status: AC
  Administered 2021-11-17: 6 mg via SUBCUTANEOUS
  Filled 2021-11-17: qty 0.6

## 2021-11-20 ENCOUNTER — Telehealth: Payer: Self-pay

## 2021-11-20 ENCOUNTER — Encounter: Payer: Self-pay | Admitting: *Deleted

## 2021-11-20 NOTE — Telephone Encounter (Signed)
Pt called in today stating she was having some pain on her right side radiating to the front. Pt concerned with history of blood clots. Asked Pt if she had any sob and she stated when she takes a deep breathe she has some pain but it is getting better. Informed Pt she had the Neulasta injection which causes bone pain. Discussed with Dr. Benay Spice who informed me to relay to Pt that if her feet should swell and continued pain to give [place] a return call Pt denied edema in legs and stated she will give a return call if symptoms should change.

## 2021-11-20 NOTE — Patient Instructions (Signed)
24h follow up after receiving Avastin: Patient having pain under right rib cage near the breast bone that started on Monday 11/19/2021. Pain is better today but pt still has a twinge of pain when taking a deep breath on the right side under rib cage. No dyspnea or chest pain. Oxygen sat 99% - checked at home per patient. No fever. BP 11/19/2021 was 120/85. Patient started taking loratidine on Friday 11/16/21 and is still taking it. Ellen Henri was given on Saturday at [facility]. Patient also spoke with Santiago Glad who is following up with Dr. Benay Spice

## 2021-12-07 ENCOUNTER — Telehealth: Payer: Self-pay | Admitting: *Deleted

## 2021-12-07 ENCOUNTER — Other Ambulatory Visit: Payer: Self-pay | Admitting: Urology

## 2021-12-07 ENCOUNTER — Encounter: Payer: Self-pay | Admitting: Oncology

## 2021-12-07 NOTE — Telephone Encounter (Signed)
Per Pam with Dr. Louis Meckel at [facility]. Patient having bilateral ureteral stent exchange on 12/28/21 and requesting guidance on holding/restarting of Xarelto. Fax MD orders to [phone].

## 2021-12-10 ENCOUNTER — Encounter: Payer: Self-pay | Admitting: *Deleted

## 2021-12-10 NOTE — Progress Notes (Signed)
Faxed note to [facility] from Dr. Benay Spice to hold Xarelto 2 days prior to procedure and resume day after procedure. Nothing needs to be done re: Avastin. Patient notified via MyChart message.

## 2021-12-16 ENCOUNTER — Other Ambulatory Visit: Payer: Self-pay | Admitting: Oncology

## 2021-12-16 IMAGING — US US RENAL
1 series · 14 of 25 positions shown · non-contrast
Comparison: CT of the pelvis dated 11/13/2020.

CLINICAL DATA: 65-year-old female with elevated creatinine. History
of fallopian tube carcinoma.

EXAM:
RENAL / URINARY TRACT ULTRASOUND COMPLETE

[Series 1: us renal · 14 of 39 slices shown]
[im 1/39]
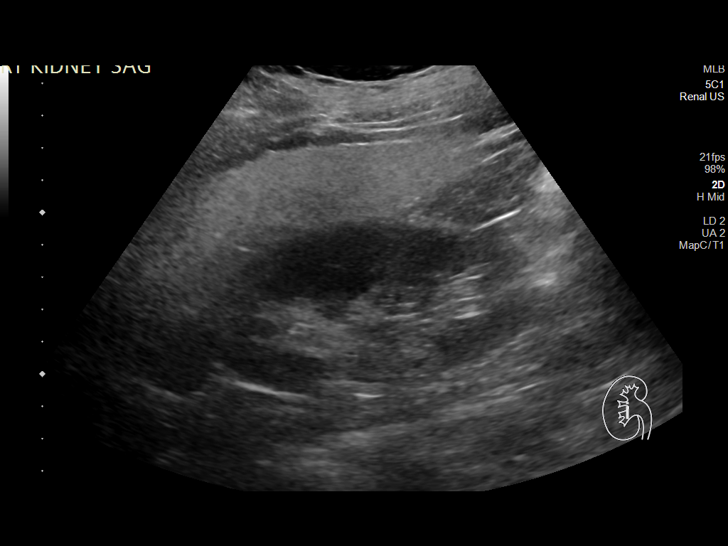
[im 4/39]
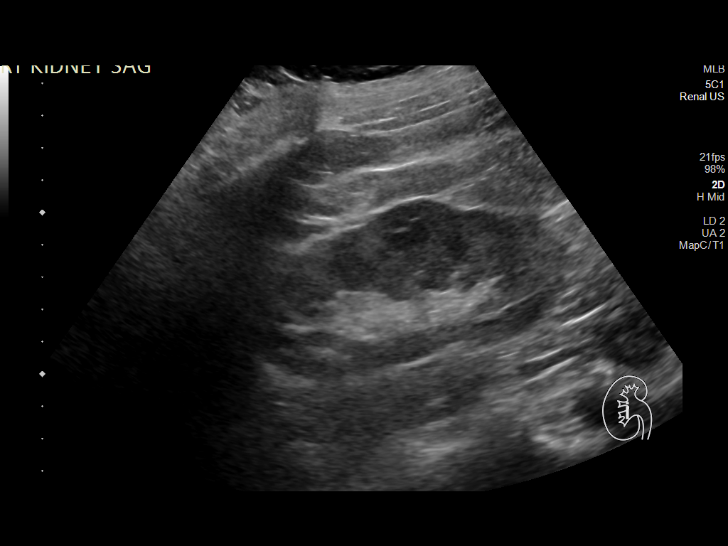
[im 7/39]
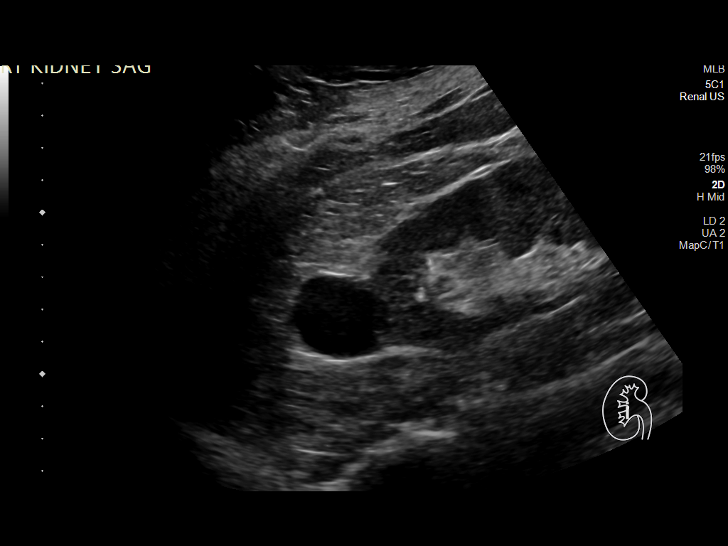
[im 10/39]
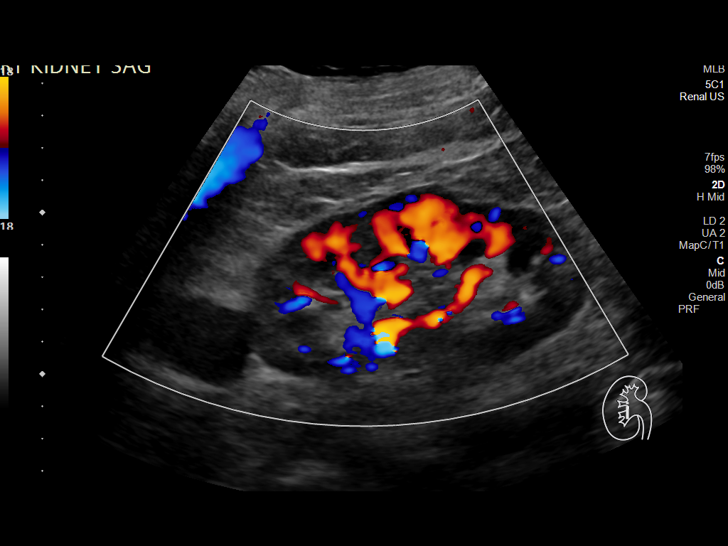
[im 13/39]
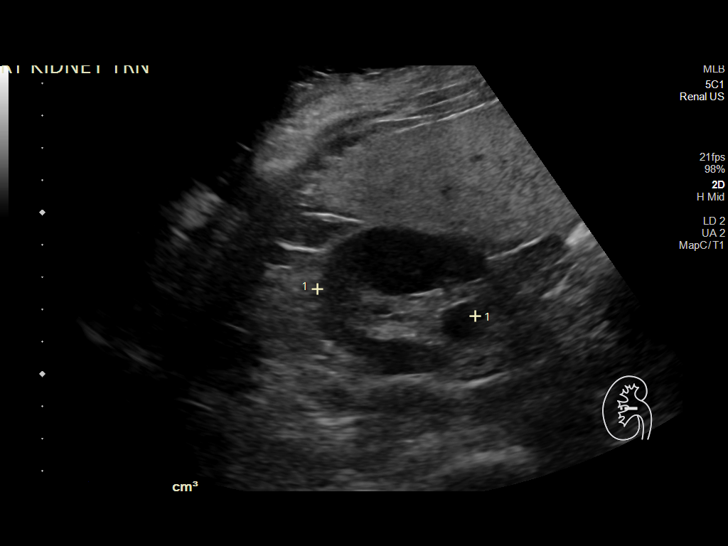
[im 15/39]
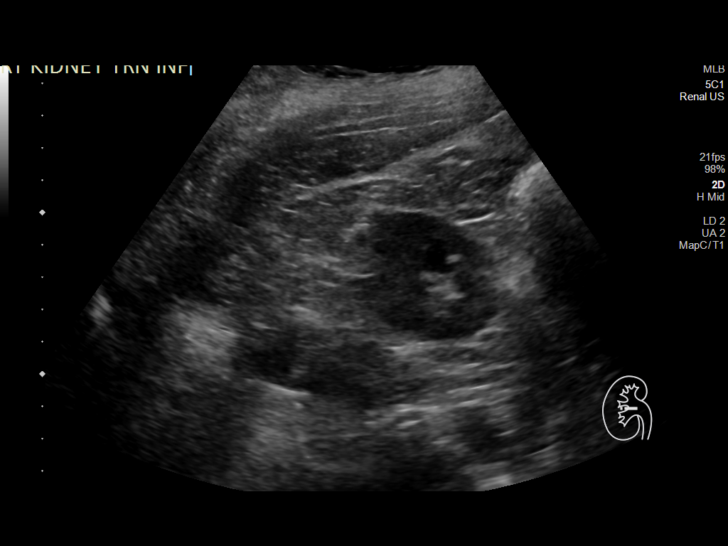
[im 18/39]
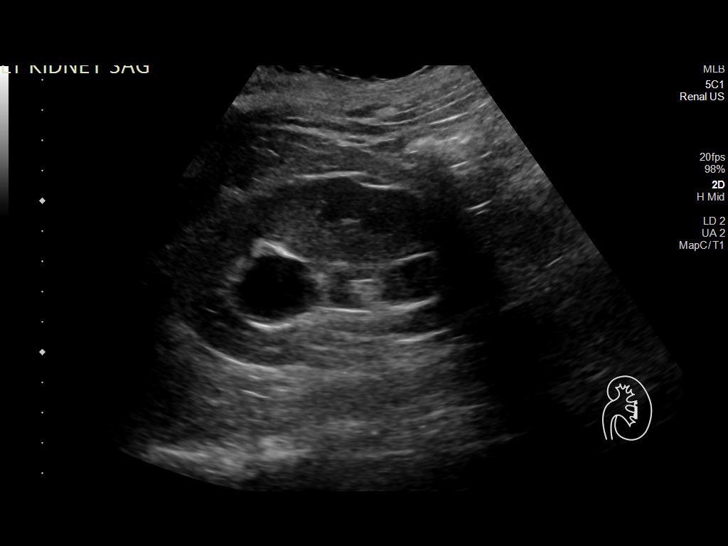
[im 21/39]
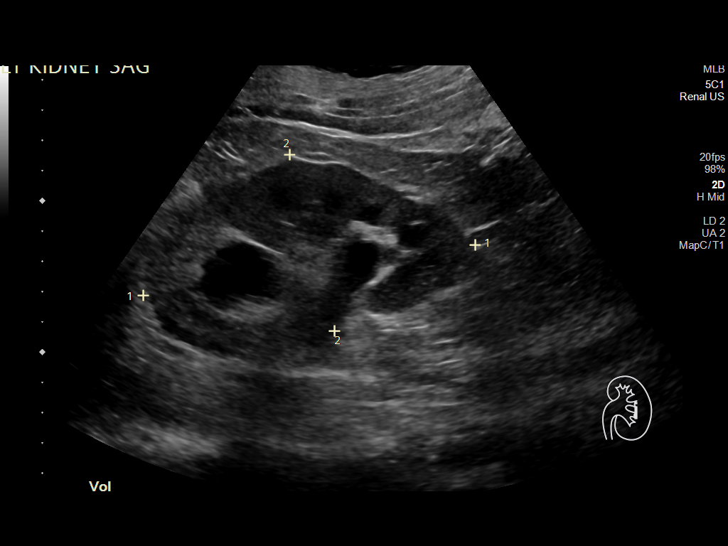
[im 24/39]
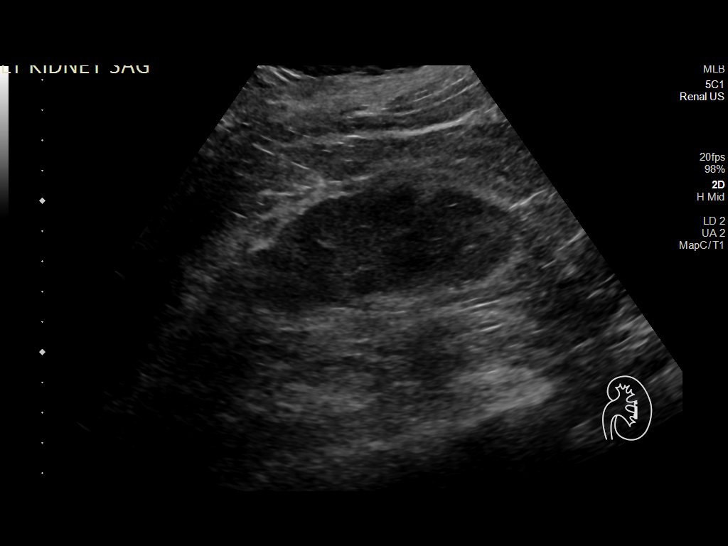
[im 26/39]
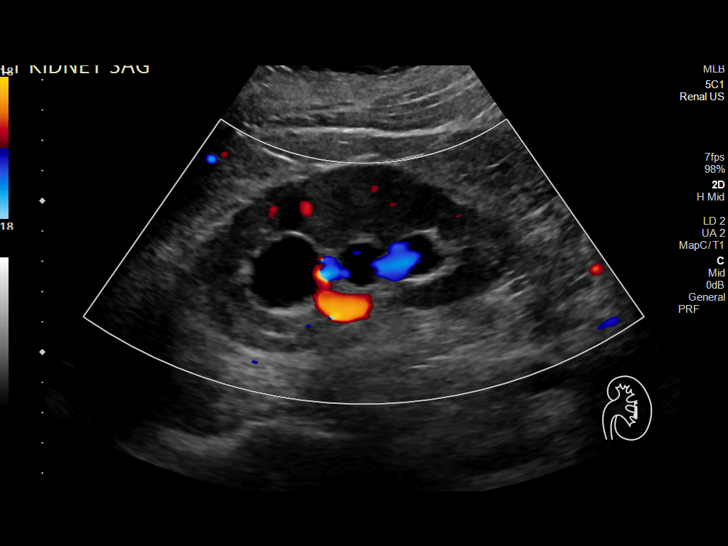
[im 29/39]
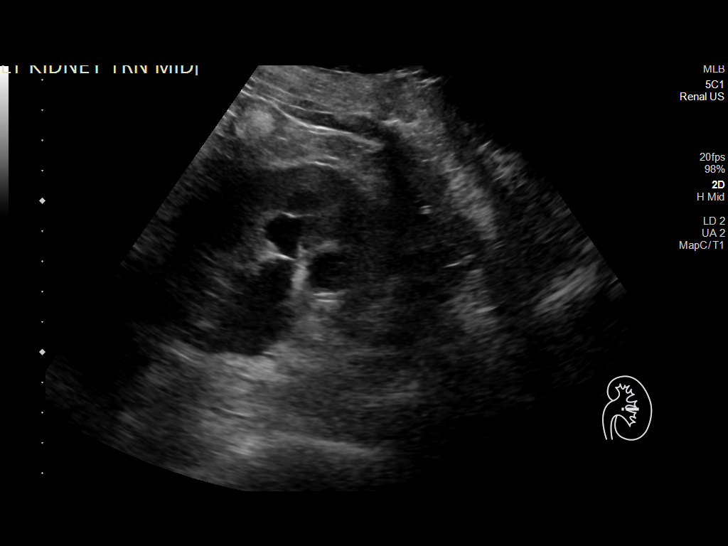
[im 32/39]
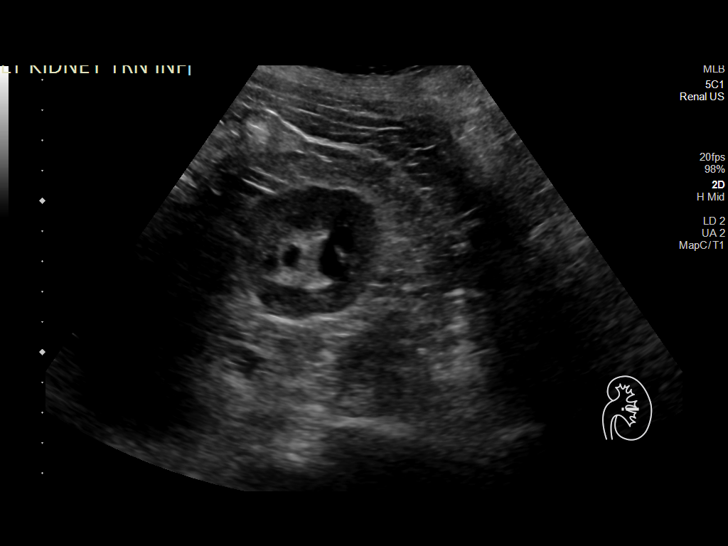
[im 35/39]
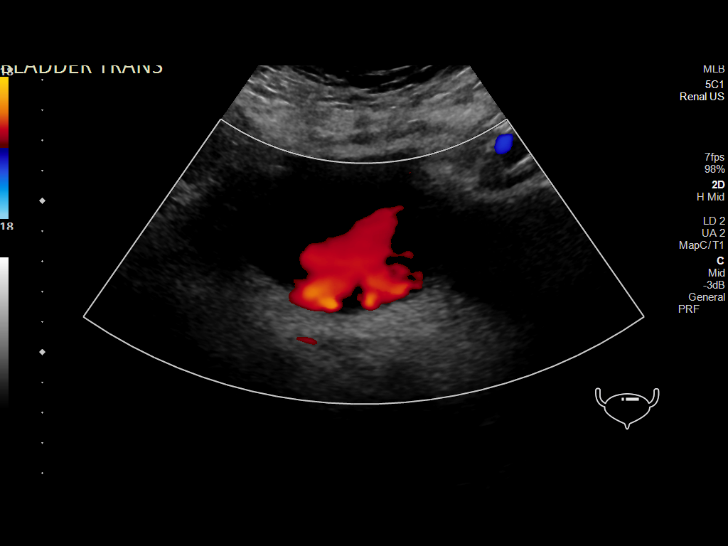
[im 39/39]
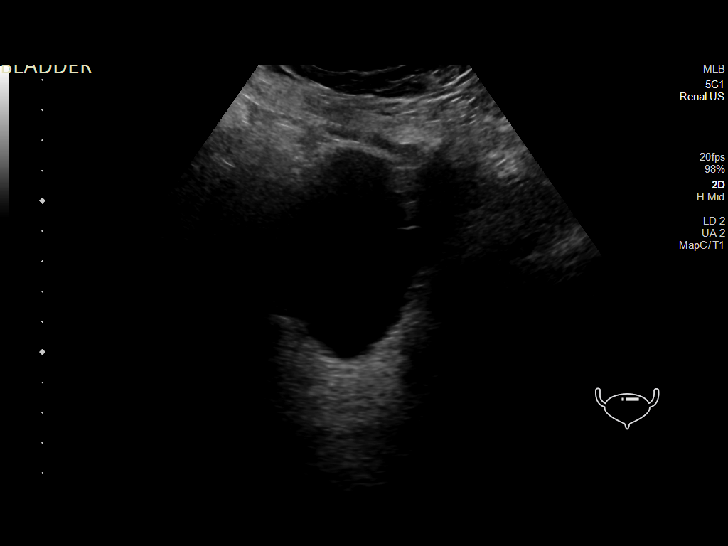

[14 of 25 positions shown; findings below may reference images not displayed]

FINDINGS: Right Kidney:

Renal measurements: 11.0 x 4.9 x 5.0 cm = volume: 140 mL. Normal
echogenicity. No hydronephrosis or shadowing stone. There is a 2.4 x
3.2 x 2.8 cm upper pole cyst also seen on the prior CT.

Left Kidney:

Renal measurements: 11.1 x 6.0 x 5.1 cm = volume: 176 mL. Normal
echogenicity. There is mild left hydronephrosis. No shadowing stone.

Bladder:

Appears normal for degree of bladder distention. Bilateral ureteral
jets noted.

Other:

There is diffuse increased liver echogenicity most commonly seen in
the setting of fatty infiltration. Superimposed inflammation or
fibrosis is not excluded. Clinical correlation is recommended.
IMPRESSION: 1. Mild left hydronephrosis.  No shadowing stone identified.
2. Fatty liver.

## 2021-12-17 ENCOUNTER — Encounter: Payer: Self-pay | Admitting: Oncology

## 2021-12-18 ENCOUNTER — Other Ambulatory Visit (HOSPITAL_COMMUNITY): Payer: BC Managed Care – PPO

## 2021-12-18 ENCOUNTER — Other Ambulatory Visit: Payer: Self-pay

## 2021-12-18 DIAGNOSIS — C5702 Malignant neoplasm of left fallopian tube: Secondary | ICD-10-CM

## 2021-12-20 ENCOUNTER — Inpatient Hospital Stay: Payer: Medicare Other

## 2021-12-20 ENCOUNTER — Other Ambulatory Visit: Payer: Self-pay | Admitting: *Deleted

## 2021-12-20 ENCOUNTER — Inpatient Hospital Stay (HOSPITAL_BASED_OUTPATIENT_CLINIC_OR_DEPARTMENT_OTHER): Payer: Medicare Other | Admitting: Oncology

## 2021-12-20 ENCOUNTER — Encounter: Payer: Self-pay | Admitting: *Deleted

## 2021-12-20 ENCOUNTER — Inpatient Hospital Stay: Payer: Medicare Other | Attending: Oncology

## 2021-12-20 ENCOUNTER — Other Ambulatory Visit: Payer: Self-pay

## 2021-12-20 ENCOUNTER — Encounter: Payer: Self-pay | Admitting: Oncology

## 2021-12-20 VITALS — BP 138/83 | HR 83 | Temp 98.0°F | Resp 20

## 2021-12-20 VITALS — BP 137/82 | HR 87 | Temp 97.8°F | Resp 18 | Ht 63.0 in | Wt 173.0 lb

## 2021-12-20 DIAGNOSIS — C5702 Malignant neoplasm of left fallopian tube: Secondary | ICD-10-CM

## 2021-12-20 DIAGNOSIS — Z5111 Encounter for antineoplastic chemotherapy: Secondary | ICD-10-CM | POA: Diagnosis not present

## 2021-12-20 DIAGNOSIS — Z5189 Encounter for other specified aftercare: Secondary | ICD-10-CM | POA: Insufficient documentation

## 2021-12-20 DIAGNOSIS — Z5112 Encounter for antineoplastic immunotherapy: Secondary | ICD-10-CM | POA: Diagnosis not present

## 2021-12-20 DIAGNOSIS — R944 Abnormal results of kidney function studies: Secondary | ICD-10-CM | POA: Diagnosis not present

## 2021-12-20 DIAGNOSIS — F329 Major depressive disorder, single episode, unspecified: Secondary | ICD-10-CM | POA: Diagnosis not present

## 2021-12-20 DIAGNOSIS — D709 Neutropenia, unspecified: Secondary | ICD-10-CM | POA: Insufficient documentation

## 2021-12-20 DIAGNOSIS — E039 Hypothyroidism, unspecified: Secondary | ICD-10-CM | POA: Insufficient documentation

## 2021-12-20 LAB — CMP (CANCER CENTER ONLY)
ALT: 17 U/L (ref 0–44)
AST: 21 U/L (ref 15–41)
Albumin: 3.9 g/dL (ref 3.5–5.0)
Alkaline Phosphatase: 118 U/L (ref 38–126)
Anion gap: 9 (ref 5–15)
BUN: 18 mg/dL (ref 8–23)
CO2: 25 mmol/L (ref 22–32)
Calcium: 9.3 mg/dL (ref 8.9–10.3)
Chloride: 105 mmol/L (ref 98–111)
Creatinine: 1.15 mg/dL — ABNORMAL HIGH (ref 0.44–1.00)
GFR, Estimated: 53 mL/min — ABNORMAL LOW (ref >60)
Glucose, Bld: 91 mg/dL (ref 70–99)
Potassium: 4.1 mmol/L (ref 3.5–5.1)
Sodium: 139 mmol/L (ref 135–145)
Total Bilirubin: 0.3 mg/dL (ref 0.3–1.2)
Total Protein: 7.2 g/dL (ref 6.5–8.1)

## 2021-12-20 LAB — CBC WITH DIFFERENTIAL (CANCER CENTER ONLY)
Abs Immature Granulocytes: 0.02 10*3/uL (ref 0.00–0.07)
Basophils Absolute: 0 10*3/uL (ref 0.0–0.1)
Basophils Relative: 0 %
Eosinophils Absolute: 0 10*3/uL (ref 0.0–0.5)
Eosinophils Relative: 1 %
HCT: 32 % — ABNORMAL LOW (ref 36.0–46.0)
Hemoglobin: 10.1 g/dL — ABNORMAL LOW (ref 12.0–15.0)
Immature Granulocytes: 0 %
Lymphocytes Relative: 51 %
Lymphs Abs: 3 10*3/uL (ref 0.7–4.0)
MCH: 32.4 pg (ref 26.0–34.0)
MCHC: 31.6 g/dL (ref 30.0–36.0)
MCV: 102.6 fL — ABNORMAL HIGH (ref 80.0–100.0)
Monocytes Absolute: 0.8 10*3/uL (ref 0.1–1.0)
Monocytes Relative: 14 %
Neutro Abs: 2.1 10*3/uL (ref 1.7–7.7)
Neutrophils Relative %: 34 %
Platelet Count: 210 10*3/uL (ref 150–400)
RBC: 3.12 MIL/uL — ABNORMAL LOW (ref 3.87–5.11)
RDW: 18.1 % — ABNORMAL HIGH (ref 11.5–15.5)
WBC Count: 6 10*3/uL (ref 4.0–10.5)
nRBC: 0 % (ref 0.0–0.2)

## 2021-12-20 LAB — TOTAL PROTEIN, URINE DIPSTICK

## 2021-12-20 LAB — TSH: TSH: 3.306 u[IU]/mL (ref 0.350–4.500)

## 2021-12-20 MED ORDER — SODIUM CHLORIDE 0.9 % IV SOLN
10.0000 mg | Freq: Once | INTRAVENOUS | Status: AC
  Administered 2021-12-20: 10 mg via INTRAVENOUS
  Filled 2021-12-20: qty 10

## 2021-12-20 MED ORDER — SODIUM CHLORIDE 0.9 % IV SOLN: Freq: Once | INTRAVENOUS | Status: AC

## 2021-12-20 MED ORDER — HEPARIN SOD (PORK) LOCK FLUSH 100 UNIT/ML IV SOLN
500.0000 [IU] | Freq: Once | INTRAVENOUS | Status: AC | PRN
  Administered 2021-12-20: 500 [IU]

## 2021-12-20 MED ORDER — DOXORUBICIN HCL LIPOSOMAL CHEMO INJECTION 2 MG/ML
50.0000 mg | Freq: Once | INTRAVENOUS | Status: AC
  Administered 2021-12-20: 50 mg via INTRAVENOUS
  Filled 2021-12-20: qty 25

## 2021-12-20 MED ORDER — SODIUM CHLORIDE 0.9 % IV SOLN
10.0000 mg/kg | Freq: Once | INTRAVENOUS | Status: AC
  Administered 2021-12-20: 800 mg via INTRAVENOUS
  Filled 2021-12-20: qty 32

## 2021-12-20 MED ORDER — SODIUM CHLORIDE 0.9% FLUSH
10.0000 mL | INTRAVENOUS | Status: DC | PRN
  Administered 2021-12-20: 10 mL

## 2021-12-20 MED ORDER — DEXTROSE 5 % IV SOLN: Freq: Once | INTRAVENOUS | Status: AC

## 2021-12-20 NOTE — Progress Notes (Signed)
[place] OFFICE PROGRESS NOTE   Diagnosis: Fallopian tube carcinoma  INTERVAL HISTORY:   Anna [name] completed another cycle of Doxil with bevacizumab on 11/16/2021.  She tolerated treatment well.  No nausea/vomiting, mouth sores, bleeding, or symptom of thrombosis.  She received G-CSF on 11/17/2021.  She developed pain in the right low anterior costal region approximately 4 to 5 days later.  This pain resolved over 1 to 2 days.  The pain felt different compared to when she had a pulmonary embolism. She developed a "blister "between the right first and second toes.  This has resolved.  Mild erythema of the palms.  No pain.  Objective:  Vital signs in last 24 hours:  Blood pressure 137/82, pulse 87, temperature 97.8 F (36.6 C), temperature source Oral, resp. rate 18, height 5' 3"  (1.6 m), weight 173 lb (78.5 kg), SpO2 100 %.    HEENT: No thrush or ulcers Resp: Lungs clear bilaterally Cardio: Regular rate and rhythm GI: No hepatosplenomegaly, no apparent ascites, nontender Vascular: No leg edema Musculoskeletal: No tenderness at the right costal margin Skin: Mild erythema of the palms, small callus between the right first and second toes  Portacath/PICC-without erythema  Lab Results:  Lab Results  Component Value Date   WBC 6.0 12/20/2021   HGB 10.1 (L) 12/20/2021   HCT 32.0 (L) 12/20/2021   MCV 102.6 (H) 12/20/2021   PLT 210 12/20/2021   NEUTROABS 2.1 12/20/2021    CMP  Lab Results  Component Value Date   NA 139 12/20/2021   K 4.1 12/20/2021   CL 105 12/20/2021   CO2 25 12/20/2021   GLUCOSE 91 12/20/2021   BUN 18 12/20/2021   CREATININE 1.15 (H) 12/20/2021   CALCIUM 9.3 12/20/2021   PROT 7.2 12/20/2021   ALBUMIN 3.9 12/20/2021   AST 21 12/20/2021   ALT 17 12/20/2021   ALKPHOS 118 12/20/2021   BILITOT 0.3 12/20/2021   GFRNONAA 53 (L) 12/20/2021   GFRAA >60 08/10/2020      Medications: I have reviewed the patient's current  medications.   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 disease to the omentum, right fallopian tube, and bilateral ovaries,pT3,pNx, optimal debulking with small peritoneal studding of the diaphragm and a remaining thin rind of tumor at the posterior peritoneum in the pelvis Cycle 1 adjuvant Taxol/carboplatin 08/05/2017 Cycle 2 adjuvant Taxol/carboplatin 08/26/2017 Cycle 3 adjuvant Taxol/carboplatin 09/17/2017-Taxol dose reduced secondary to neuropathy and bone pain  Cycle 4 adjuvant Taxol/carboplatin 10/10/2017 Cycle 5 adjuvant Taxol/carboplatin 10/31/2017 Cycle 6 adjuvant Taxol/carboplatin 11/21/2017 CT abdomen/pelvis 08/08/2019- narrowing of rectosigmoid colon with eccentric serosal implant, 10 mm lymph node at the superior rectal vein, left upper quadrant soft tissue implant, 9 mm precaval node, 15 mm left internal iliac node, no ascites Cycle 1 Taxol/carboplatin 08/31/2019 Cycle 2 Taxol/carboplatin/Avastin 09/21/2019 Cycle 3 Taxol/Carboplatin 10/12/2019 (Avastin discontinued) Cycle 4 Taxol/carboplatin November 02, 2019 CT abdomen/pelvis 11/17/2019-decrease in size of previously prominent pericaval, left internal iliac, and superior rectal vein nodes, resolved rectosigmoid wall thickening Cycle 5 Taxol/carboplatin 11/22/2019 Cycle 6 Taxol/carboplatin 12/13/2019 Olaparib 01/05/2020 CT abdomen/pelvis 07/10/2020-enlarging soft tissue at the distal left ureter, the ureter is not  dilated, peritoneal  implant adjacent to the splenic flexure, enlarged left superior rectal node, lymph nodes along the course of the distal IMV have enlarged, eccentric thickening of the sigmoid colon not seen  Olaraparib discontinued 07/12/2020 Cycle 1 Taxol/carboplatin 07/21/2020 Cycle 2 Taxol/carboplatin 08/10/2020 (carboplatin dose reduced secondary to thrombocytopenia) Cycle 3 Taxol/carboplatin 09/05/2020 Cycle 4 Taxol/carboplatin 09/26/2020 Cycle 5 Taxol/carboplatin 10/24/2020 CT abdomen/pelvis 11/13/2020-overall stable disease, no new site of metastatic disease, generally stable small abdominal/pelvic lymph nodes, slight enlargement of a peritoneal implant in the left pelvis, slight decrease in size of another pelvic implant Cycle 6 Taxol/carboplatin 11/14/2020 Cycle 7 Taxol/carboplatin 12/05/2020 Cycle 8 Taxol/carboplatin 12/26/2020 Cycle 9 Taxol/carboplatin 01/16/2021 Cycle 10 Taxol/carboplatin 02/06/2021 Cycle 11 Taxol/carboplatin 02/27/2021 Cycle 12 Taxol/carboplatin 03/20/2021 Cycle 13 Taxol/carboplatin 04/10/2021 Cycle 14 Taxol/carboplatin 05/08/2021 Cycle 15 Taxol/carboplatin 05/29/2021 Cycle 16 Taxol/carboplatin 06/19/2021 CT abdomen/pelvis 07/06/2021-increased size of a left pelvic soft tissue mass with obstruction of the distal left ureter and new moderate left hydronephrosis, no change in mild pelvic lymphadenopathy Systemic therapy placed on hold CT abdomen/pelvis 10/15/2021-new bilateral pulmonary metastases, new hepatic metastases, mild increase in size of left adnexal mass, mild increase in size of sigmoid mesenteric and left upper quadrant omental nodules CT head 10/15/2021-large lucency in the left parietal skull concerning for a bone metastasis Cycle 1 Doxil 10/23/2021 Cycle 2 Doxil/bevacizumab 11/16/2021 Cycle 3 Doxil/bevacizumab 12/20/2021    Depression   3.   Hypothyroid   4.   Family history of uterine cancer-paternal grandmother   24.   Acute onset dyspnea/pleuritic left-sided chest pain  10/02/2019 CT chest 10/04/2019-left lower lobe pulmonary emboli, multifocal pneumonia lower lobes On Xarelto, status post course of Levaquin.  6.  Acute onset left lower back pain 01/24/2021-resolved after a Medrol Dosepak  7.  Mild elevation of the creatinine, renal ultrasound 05/31/2021-mild left hydronephrosis CT 07/06/2021-new moderate left hydronephrosis, left ureter stent placed 07/18/2021 8.  Neutropenia following cycle 1 Doxil-Udenyca added with cycle 2        Disposition: Anna [name] appears stable.  She has completed 2 cycles of Doxil.  Bevacizumab was added beginning with cycle 2.  She is tolerating the treatment well.  The CA125 was lower last month.  We will follow-up on the CA125 today.  She will complete cycle 3 Doxil/bevacizumab beginning today.  She will return for an office visit in 4 weeks.  She will call for recurrent right chest pain.  The pain may have been related to G-CSF or a benign musculoskeletal condition.  [name], MD  12/20/2021  10:51 AM

## 2021-12-20 NOTE — Patient Instructions (Signed)
DUE TO COVID-19 ONLY ONE VISITOR IS ALLOWED TO COME WITH YOU AND STAY IN THE WAITING ROOM ONLY DURING PRE OP AND PROCEDURE DAY OF SURGERY.          Your procedure is scheduled on: 12-28-21   Report to [facility] Main  Entrance   Report to admitting at     [place] AM     Call this number if you have problems the morning of surgery [phone]   Remember: Do not eat food  :After Midnight. You may have clear liquids until 0430 am then nothing by mouth    CLEAR LIQUID DIET                                                                    water Black Coffee and tea, regular and decaf No Creamer                            Plain Jell-O any favor except red or purple                                  Fruit ices (not with fruit pulp)                                      Iced Popsicles                                     Carbonated beverages, regular and diet                                    Cranberry, grape and apple juices Sports drinks like Gatorade Lightly seasoned clear broth or consume(fat free) Sugar, honey syrup  _____________________________________________________________________      BRUSH YOUR TEETH MORNING OF SURGERY AND RINSE YOUR MOUTH OUT, NO CHEWING GUM CANDY OR MINTS.     Take these medicines the morning of surgery with A SIP OF WATER: amlodipine, bupropion, levothyroxine, pantoprazole  DO NOT TAKE ANY DIABETIC MEDICATIONS DAY OF YOUR SURGERY                               You may not have any metal on your body including hair pins and              piercings  Do not wear jewelry, make-up, lotions, powders,perfumes,        deodorant             Do not wear nail polish on your fingernails or toenails .  Do not shave  48 hours prior to surgery.               Do not bring valuables to the hospital. [place].  Contacts, dentures or  bridgework may not be worn into surgery.       Patients discharged the day  of surgery will not be allowed to drive home. IF YOU ARE HAVING SURGERY AND GOING HOME THE SAME DAY, YOU MUST HAVE AN ADULT TO DRIVE YOU HOME AND BE WITH YOU FOR 24 HOURS. YOU MAY GO HOME BY TAXI OR UBER OR ORTHERWISE, BUT AN ADULT MUST ACCOMPANY YOU HOME AND STAY WITH YOU FOR 24 HOURS.  Name and phone number of your driver:  Special Instructions: N/A              Please read over the following fact sheets you were given: _____________________________________________________________________             [facility] - Preparing for Surgery Before surgery, you can play an important role.  Because skin is not sterile, your skin needs to be as free of germs as possible.  You can reduce the number of germs on your skin by washing with CHG (chlorahexidine gluconate) soap before surgery.  CHG is an antiseptic cleaner which kills germs and bonds with the skin to continue killing germs even after washing. Please DO NOT use if you have an allergy to CHG or antibacterial soaps.  If your skin becomes reddened/irritated stop using the CHG and inform your nurse when you arrive at Short Stay. Do not shave (including legs and underarms) for at least 48 hours prior to the first CHG shower.  You may shave your face/neck. Please follow these instructions carefully:  1.  Shower with CHG Soap the night before surgery and the  morning of Surgery.  2.  If you choose to wash your hair, wash your hair first as usual with your  normal  shampoo.  3.  After you shampoo, rinse your hair and body thoroughly to remove the  shampoo.                           4.  Use CHG as you would any other liquid soap.  You can apply chg directly  to the skin and wash                       Gently with a scrungie or clean washcloth.  5.  Apply the CHG Soap to your body ONLY FROM THE NECK DOWN.   Do not use on face/ open                           Wound or open sores. Avoid contact with eyes, ears mouth and genitals (private parts).                        Wash face,  Genitals (private parts) with your normal soap.             6.  Wash thoroughly, paying special attention to the area where your surgery  will be performed.  7.  Thoroughly rinse your body with warm water from the neck down.  8.  DO NOT shower/wash with your normal soap after using and rinsing off  the CHG Soap.                9.  Pat yourself dry with a clean towel.            10.  Wear clean pajamas.  11.  Place clean sheets on your bed the night of your first shower and do not  sleep with pets. Day of Surgery : Do not apply any lotions/deodorants the morning of surgery.  Please wear clean clothes to the hospital/surgery center.  FAILURE TO FOLLOW THESE INSTRUCTIONS MAY RESULT IN THE CANCELLATION OF YOUR SURGERY PATIENT SIGNATURE_________________________________  NURSE SIGNATURE__________________________________  ________________________________________________________________________

## 2021-12-20 NOTE — Progress Notes (Signed)
Patient seen by Dr. Benay Spice today  Vitals are within treatment parameters.  Labs reviewed by Dr. Benay Spice and are within treatment parameters. Will collect urine protein today prior to Avastin  Per physician team, patient is ready for treatment and there are NO modifications to the treatment plan.

## 2021-12-20 NOTE — Progress Notes (Signed)
Patient presents for treatment. RN assessment completed along with the following:  Labs/vitals reviewed - Yes, and within treatment parameters.   Weight within 10% of previous measurement - Yes Informed consent completed and reflects current therapy/intent - Yes, on date 10/23/2021             Provider progress note reviewed - Yes, today's provider note was reviewed. Treatment/Antibody/Supportive plan reviewed - Yes, and there are no adjustments needed for today's treatment. S&H and other orders reviewed - Yes, and there are no additional orders identified. Previous treatment date reviewed - Yes, and the appropriate amount of time has elapsed between treatments. Clinic Hand Off Received from - Yes from [facility], RN  Patient to proceed with treatment.

## 2021-12-20 NOTE — Patient Instructions (Signed)
[place]   Discharge Instructions: Thank you for choosing [facility] to provide your oncology and hematology care.   If you have a lab appointment with the [facility], please go directly to the [facility] and check in at the registration area.   Wear comfortable clothing and clothing appropriate for easy access to any Portacath or PICC line.   We strive to give you quality time with your provider. You may need to reschedule your appointment if you arrive late (15 or more minutes).  Arriving late affects you and other patients whose appointments are after yours.  Also, if you miss three or more appointments without notifying the office, you may be dismissed from the clinic at the providers discretion.      For prescription refill requests, have your pharmacy contact our office and allow 72 hours for refills to be completed.    Today you received the following chemotherapy and/or immunotherapy agents Bevacizumab-awwb (MVASI) & Doxorubicin Liposomal (DOXIL).      To help prevent nausea and vomiting after your treatment, we encourage you to take your nausea medication as directed.  BELOW ARE SYMPTOMS THAT SHOULD BE REPORTED IMMEDIATELY: *FEVER GREATER THAN 100.4 F (38 C) OR HIGHER *CHILLS OR SWEATING *NAUSEA AND VOMITING THAT IS NOT CONTROLLED WITH YOUR NAUSEA MEDICATION *UNUSUAL SHORTNESS OF BREATH *UNUSUAL BRUISING OR BLEEDING *URINARY PROBLEMS (pain or burning when urinating, or frequent urination) *BOWEL PROBLEMS (unusual diarrhea, constipation, pain near the anus) TENDERNESS IN MOUTH AND THROAT WITH OR WITHOUT PRESENCE OF ULCERS (sore throat, sores in mouth, or a toothache) UNUSUAL RASH, SWELLING OR PAIN  UNUSUAL VAGINAL DISCHARGE OR ITCHING   Items with * indicate a potential emergency and should be followed up as soon as possible or go to the Emergency Department if any problems should occur.  Please show the CHEMOTHERAPY ALERT  CARD or IMMUNOTHERAPY ALERT CARD at check-in to the Emergency Department and triage nurse.  Should you have questions after your visit or need to cancel or reschedule your appointment, please contact [facility]  Dept: [phone]  and follow the prompts.  Office hours are 8:00 a.m. to 4:30 p.m. Monday - Friday. Please note that voicemails left after 4:00 p.m. may not be returned until the following business day.  We are closed weekends and major holidays. You have access to a nurse at all times for urgent questions. Please call the main number to the clinic Dept: [phone] and follow the prompts.   For any non-urgent questions, you may also contact your provider using MyChart. We now offer e-Visits for anyone 37 and older to request care online for non-urgent symptoms. For details visit [email or web address].   Also download the MyChart app! Go to the app store, search "MyChart", open the app, select [facility], and log in with your MyChart username and password.  Due to Covid, a mask is required upon entering the hospital/clinic. If you do not have a mask, one will be given to you upon arrival. For doctor visits, patients may have 1 support person aged 45 or older with them. For treatment visits, patients cannot have anyone with them due to current Covid guidelines and our immunocompromised population.   Bevacizumab injection What is this medication? BEVACIZUMAB (be va SIZ yoo mab) is a monoclonal antibody. It is used to treat many types of cancer. This medicine may be used for other purposes; ask your health care provider or pharmacist if you have  questions. COMMON BRAND NAME(S): Alymsys, Avastin, MVASI, Noah Charon What should I tell my care team before I take this medication? They need to know if you have any of these conditions: diabetes heart disease high blood pressure history of coughing up blood prior anthracycline chemotherapy (e.g., doxorubicin,  daunorubicin, epirubicin) recent or ongoing radiation therapy recent or planning to have surgery stroke an unusual or allergic reaction to bevacizumab, hamster proteins, mouse proteins, other medicines, foods, dyes, or preservatives pregnant or trying to get pregnant breast-feeding How should I use this medication? This medicine is for infusion into a vein. It is given by a health care professional in a hospital or clinic setting. Talk to your pediatrician regarding the use of this medicine in children. Special care may be needed. Overdosage: If you think you have taken too much of this medicine contact a poison control center or emergency room at once. NOTE: This medicine is only for you. Do not share this medicine with others. What if I miss a dose? It is important not to miss your dose. Call your doctor or health care professional if you are unable to keep an appointment. What may interact with this medication? Interactions are not expected. This list may not describe all possible interactions. Give your health care provider a list of all the medicines, herbs, non-prescription drugs, or dietary supplements you use. Also tell them if you smoke, drink alcohol, or use illegal drugs. Some items may interact with your medicine. What should I watch for while using this medication? Your condition will be monitored carefully while you are receiving this medicine. You will need important blood work and urine testing done while you are taking this medicine. This medicine may increase your risk to bruise or bleed. Call your doctor or health care professional if you notice any unusual bleeding. Before having surgery, talk to your health care provider to make sure it is ok. This drug can increase the risk of poor healing of your surgical site or wound. You will need to stop this drug for 28 days before surgery. After surgery, wait at least 28 days before restarting this drug. Make sure the surgical site or  wound is healed enough before restarting this drug. Talk to your health care provider if questions. Do not become pregnant while taking this medicine or for 6 months after stopping it. Women should inform their doctor if they wish to become pregnant or think they might be pregnant. There is a potential for serious side effects to an unborn child. Talk to your health care professional or pharmacist for more information. Do not breast-feed an infant while taking this medicine and for 6 months after the last dose. This medicine has caused ovarian failure in some women. This medicine may interfere with the ability to have a child. You should talk to your doctor or health care professional if you are concerned about your fertility. What side effects may I notice from receiving this medication? Side effects that you should report to your doctor or health care professional as soon as possible: allergic reactions like skin rash, itching or hives, swelling of the face, lips, or tongue chest pain or chest tightness chills coughing up blood high fever seizures severe constipation signs and symptoms of bleeding such as bloody or black, tarry stools; red or dark-brown urine; spitting up blood or brown material that looks like coffee grounds; red spots on the skin; unusual bruising or bleeding from the eye, gums, or nose signs and symptoms of a  blood clot such as breathing problems; chest pain; severe, sudden headache; pain, swelling, warmth in the leg signs and symptoms of a stroke like changes in vision; confusion; trouble speaking or understanding; severe headaches; sudden numbness or weakness of the face, arm or leg; trouble walking; dizziness; loss of balance or coordination stomach pain sweating swelling of legs or ankles vomiting weight gain Side effects that usually do not require medical attention (report to your doctor or health care professional if they continue or are bothersome): back  pain changes in taste decreased appetite dry skin nausea tiredness This list may not describe all possible side effects. Call your doctor for medical advice about side effects. You may report side effects to FDA at 1-800-FDA-1088. Where should I keep my medication? This drug is given in a hospital or clinic and will not be stored at home. NOTE: This sheet is a summary. It may not cover all possible information. If you have questions about this medicine, talk to your doctor, pharmacist, or health care provider.  2022 Elsevier/Gold Standard (2021-07-24 00:00:00)  Doxorubicin Liposomal injection What is this medication? LIPOSOMAL DOXORUBICIN (LIP oh som al  dox oh ROO bi sin) is a chemotherapy drug. This medicine is used to treat many kinds of cancer like Kaposi's sarcoma, multiple myeloma, and ovarian cancer. This medicine may be used for other purposes; ask your health care provider or pharmacist if you have questions. COMMON BRAND NAME(S): Doxil, Lipodox What should I tell my care team before I take this medication? They need to know if you have any of these conditions: blood disorders heart disease infection (especially a virus infection such as chickenpox, cold sores, or herpes) liver disease recent or ongoing radiation therapy an unusual or allergic reaction to doxorubicin, other chemotherapy agents, soybeans, other medicines, foods, dyes, or preservatives pregnant or trying to get pregnant breast-feeding How should I use this medication? This drug is given as an infusion into a vein. It is administered in a hospital or clinic by a specially trained health care professional. If you have pain, swelling, burning or any unusual feeling around the site of your injection, tell your health care professional right away. Talk to your pediatrician regarding the use of this medicine in children. Special care may be needed. Overdosage: If you think you have taken too much of this medicine  contact a poison control center or emergency room at once. NOTE: This medicine is only for you. Do not share this medicine with others. What if I miss a dose? It is important not to miss your dose. Call your doctor or health care professional if you are unable to keep an appointment. What may interact with this medication? Do not take this medicine with any of the following medications: zidovudine This medicine may also interact with the following medications: medicines to increase blood counts like filgrastim, pegfilgrastim, sargramostim vaccines Talk to your doctor or health care professional before taking any of these medicines: acetaminophen aspirin ibuprofen ketoprofen naproxen This list may not describe all possible interactions. Give your health care provider a list of all the medicines, herbs, non-prescription drugs, or dietary supplements you use. Also tell them if you smoke, drink alcohol, or use illegal drugs. Some items may interact with your medicine. What should I watch for while using this medication? Your condition will be monitored carefully while you are receiving this medicine. You may need blood work done while you are taking this medicine. This drug may make you feel generally unwell. This is  not uncommon, as chemotherapy can affect healthy cells as well as cancer cells. Report any side effects. Continue your course of treatment even though you feel ill unless your doctor tells you to stop. Your urine may turn orange-red for a few days after your dose. This is not blood. If your urine is dark or brown, call your doctor. In some cases, you may be given additional medicines to help with side effects. Follow all directions for their use. Talk to your doctor about your risk of cancer. You may be more at risk for certain types of cancers if you take this medicine. Do not become pregnant while taking this medicine or for 6 months after stopping it. Women should inform their  healthcare professional if they wish to become pregnant or think they may be pregnant. Men should not father a child while taking this medicine and for 6 months after stopping it. There is a potential for serious side effects to an unborn child. Talk to your health care professional or pharmacist for more information. Do not breast-feed an infant while taking this medicine. This medicine has caused ovarian failure in some women. This medicine may make it more difficult to get pregnant. Talk to your healthcare professional if you are concerned about your fertility. This medicine has caused decreased sperm counts in some men. This may make it more difficult to father a child. Talk to your healthcare professional if you are concerned about your fertility. This medicine may cause a decrease in Co-Enzyme Q-10. You should make sure that you get enough Co-Enzyme Q-10 while you are taking this medicine. Discuss the foods you eat and the vitamins you take with your health care professional. What side effects may I notice from receiving this medication? Side effects that you should report to your doctor or health care professional as soon as possible: allergic reactions like skin rash, itching or hives, swelling of the face, lips, or tongue low blood counts - this medicine may decrease the number of white blood cells, red blood cells and platelets. You may be at increased risk for infections and bleeding. signs of hand-foot syndrome - tingling or burning, redness, flaking, swelling, small blisters, or small sores on the palms of your hands or the soles of your feet signs of infection - fever or chills, cough, sore throat, pain or difficulty passing urine signs of decreased platelets or bleeding - bruising, pinpoint red spots on the skin, black, tarry stools, blood in the urine signs of decreased red blood cells - unusually weak or tired, fainting spells, lightheadedness back pain, chills, facial flushing, fever,  headache, tightness in the chest or throat during the infusion breathing problems chest pain fast, irregular heartbeat mouth pain, redness, sores pain, swelling, redness at site where injected pain, tingling, numbness in the hands or feet swelling of ankles, feet, or hands vomiting Side effects that usually do not require medical attention (report to your doctor or health care professional if they continue or are bothersome): diarrhea hair loss loss of appetite nail discoloration or damage nausea red or watery eyes red colored urine stomach upset This list may not describe all possible side effects. Call your doctor for medical advice about side effects. You may report side effects to FDA at 1-800-FDA-1088. Where should I keep my medication? This drug is given in a hospital or clinic and will not be stored at home. NOTE: This sheet is a summary. It may not cover all possible information. If you have questions about  this medicine, talk to your doctor, pharmacist, or health care provider.  2022 Elsevier/Gold Standard (2018-07-15 00:00:00)

## 2021-12-20 NOTE — Progress Notes (Addendum)
PCP - Einar Pheasant MD  Cardiologist - no  PPM/ICD -  Device Orders -  Rep Notified -   Chest x-ray -  EKG - 07-18-21 epic Stress Test -  ECHO - 10-22-21 epic Cardiac Cath -   Sleep Study -  CPAP -   Fasting Blood Sugar -  Checks Blood Sugar _____ times a day  Blood Thinner Instructions:Xarelto hold 2 days Aspirin Instructions:  ERAS Protcol - PRE-SURGERY Ensure or G2-   COVID TEST- N/A COVID vaccine - x4   Activity--Able to walk a flight of stairs without SOB Anesthesia review: PE, HTN, OSA no cpap,   Patient denies shortness of breath, fever, cough and chest pain at PAT appointment   All instructions explained to the patient, with a verbal understanding of the material. Patient agrees to go over the instructions while at home for a better understanding. Patient also instructed to self quarantine after being tested for COVID-19. The opportunity to ask questions was provided.

## 2021-12-21 ENCOUNTER — Other Ambulatory Visit: Payer: Self-pay

## 2021-12-21 ENCOUNTER — Encounter (HOSPITAL_COMMUNITY)
Admission: RE | Admit: 2021-12-21 | Discharge: 2021-12-21 | Disposition: A | Discharge status: Home / Self Care | Payer: Medicare Other | Source: Ambulatory Visit | Attending: Urology | Admitting: Urology

## 2021-12-21 ENCOUNTER — Encounter (HOSPITAL_COMMUNITY): Payer: Self-pay

## 2021-12-21 VITALS — BP 142/98 | HR 84 | Temp 98.1°F | Resp 18 | Ht 63.0 in | Wt 172.0 lb

## 2021-12-21 DIAGNOSIS — Z01818 Encounter for other preprocedural examination: Secondary | ICD-10-CM | POA: Insufficient documentation

## 2021-12-21 DIAGNOSIS — I10 Essential (primary) hypertension: Secondary | ICD-10-CM | POA: Diagnosis not present

## 2021-12-21 LAB — CA 125: Cancer Antigen (CA) 125: 10.5 U/mL (ref 0.0–38.1)

## 2021-12-22 ENCOUNTER — Other Ambulatory Visit: Payer: Self-pay

## 2021-12-22 ENCOUNTER — Inpatient Hospital Stay: Payer: Medicare Other

## 2021-12-22 VITALS — BP 137/65 | HR 75 | Temp 97.7°F | Resp 18 | Ht 63.0 in

## 2021-12-22 DIAGNOSIS — C5702 Malignant neoplasm of left fallopian tube: Secondary | ICD-10-CM | POA: Diagnosis not present

## 2021-12-22 MED ORDER — PEGFILGRASTIM-CBQV 6 MG/0.6ML ~~LOC~~ SOSY
6.0000 mg | PREFILLED_SYRINGE | Freq: Once | SUBCUTANEOUS | Status: AC
  Administered 2021-12-22: 6 mg via SUBCUTANEOUS
  Filled 2021-12-22: qty 0.6

## 2021-12-22 NOTE — Patient Instructions (Signed)

## 2021-12-24 ENCOUNTER — Encounter: Payer: Self-pay | Admitting: Internal Medicine

## 2021-12-24 ENCOUNTER — Encounter: Payer: Self-pay | Admitting: *Deleted

## 2021-12-25 ENCOUNTER — Other Ambulatory Visit: Payer: Self-pay | Admitting: Internal Medicine

## 2021-12-27 NOTE — Anesthesia Preprocedure Evaluation (Addendum)
Anesthesia Evaluation  Patient identified by MRN, date of birth, ID band Patient awake    Reviewed: Allergy & Precautions, NPO status , Patient's Chart, lab work & pertinent test results  History of Anesthesia Complications Negative for: history of anesthetic complications  Airway Mallampati: II  TM Distance: >3 FB Neck ROM: Full    Dental no notable dental hx.    Pulmonary sleep apnea , PE (2020, on Xarelto)   Pulmonary exam normal        Cardiovascular hypertension, Pt. on medications Normal cardiovascular exam  TTE 10/2021: EF 55-60%, grade I DD, mild MR     Neuro/Psych Anxiety Depression negative neurological ROS     GI/Hepatic Neg liver ROS, GERD  Medicated and Controlled,  Endo/Other  Hypothyroidism   Renal/GU Left hydronephrosis  negative genitourinary   Musculoskeletal  (+) Arthritis ,   Abdominal   Peds  Hematology  (+) Blood dyscrasia, anemia , Hgb 10.1   Anesthesia Other Findings Day of surgery medications reviewed with patient.  Reproductive/Obstetrics negative OB ROS                            Anesthesia Physical Anesthesia Plan  ASA: 2  Anesthesia Plan: General   Post-op Pain Management: Tylenol PO (pre-op)   Induction: Intravenous  PONV Risk Score and Plan: 3 and Ondansetron, Dexamethasone, Midazolam and Treatment may vary due to age or medical condition  Airway Management Planned: LMA  Additional Equipment: None  Intra-op Plan:   Post-operative Plan: Extubation in OR  Informed Consent: I have reviewed the patients History and Physical, chart, labs and discussed the procedure including the risks, benefits and alternatives for the proposed anesthesia with the patient or authorized representative who has indicated his/her understanding and acceptance.     Dental advisory given  Plan Discussed with: CRNA  Anesthesia Plan Comments:        Anesthesia  Quick Evaluation

## 2021-12-28 ENCOUNTER — Ambulatory Visit (HOSPITAL_COMMUNITY): Payer: Medicare Other | Admitting: Emergency Medicine

## 2021-12-28 ENCOUNTER — Encounter (HOSPITAL_COMMUNITY)
Admission: RE | Disposition: A | Discharge status: Home / Self Care | Payer: Self-pay | Source: Ambulatory Visit | Attending: Urology

## 2021-12-28 ENCOUNTER — Other Ambulatory Visit: Payer: Self-pay

## 2021-12-28 ENCOUNTER — Ambulatory Visit (HOSPITAL_COMMUNITY)
Admission: RE | Admit: 2021-12-28 | Discharge: 2021-12-28 | Disposition: A | Discharge status: Home / Self Care | Payer: Medicare Other | Source: Ambulatory Visit | Attending: Urology | Admitting: Urology

## 2021-12-28 ENCOUNTER — Ambulatory Visit (HOSPITAL_COMMUNITY): Payer: Medicare Other

## 2021-12-28 ENCOUNTER — Encounter (HOSPITAL_COMMUNITY): Payer: Self-pay | Admitting: Urology

## 2021-12-28 ENCOUNTER — Ambulatory Visit (HOSPITAL_BASED_OUTPATIENT_CLINIC_OR_DEPARTMENT_OTHER): Payer: Medicare Other | Admitting: Certified Registered Nurse Anesthetist

## 2021-12-28 DIAGNOSIS — N1330 Unspecified hydronephrosis: Secondary | ICD-10-CM | POA: Diagnosis not present

## 2021-12-28 DIAGNOSIS — Z79899 Other long term (current) drug therapy: Secondary | ICD-10-CM | POA: Diagnosis not present

## 2021-12-28 DIAGNOSIS — E039 Hypothyroidism, unspecified: Secondary | ICD-10-CM | POA: Diagnosis not present

## 2021-12-28 DIAGNOSIS — N131 Hydronephrosis with ureteral stricture, not elsewhere classified: Secondary | ICD-10-CM | POA: Insufficient documentation

## 2021-12-28 DIAGNOSIS — G4730 Sleep apnea, unspecified: Secondary | ICD-10-CM | POA: Insufficient documentation

## 2021-12-28 DIAGNOSIS — M1990 Unspecified osteoarthritis, unspecified site: Secondary | ICD-10-CM | POA: Insufficient documentation

## 2021-12-28 DIAGNOSIS — I10 Essential (primary) hypertension: Secondary | ICD-10-CM | POA: Diagnosis not present

## 2021-12-28 DIAGNOSIS — C5700 Malignant neoplasm of unspecified fallopian tube: Secondary | ICD-10-CM | POA: Insufficient documentation

## 2021-12-28 DIAGNOSIS — Z86711 Personal history of pulmonary embolism: Secondary | ICD-10-CM | POA: Insufficient documentation

## 2021-12-28 DIAGNOSIS — Z7901 Long term (current) use of anticoagulants: Secondary | ICD-10-CM | POA: Diagnosis not present

## 2021-12-28 DIAGNOSIS — Z466 Encounter for fitting and adjustment of urinary device: Secondary | ICD-10-CM | POA: Diagnosis not present

## 2021-12-28 HISTORY — PX: URETEROSCOPY: SHX842

## 2021-12-28 HISTORY — PX: CYSTOSCOPY W/ URETERAL STENT PLACEMENT: SHX1429

## 2021-12-28 LAB — BASIC METABOLIC PANEL
Anion gap: 12 (ref 5–15)
BUN: 22 mg/dL (ref 8–23)
CO2: 19 mmol/L — ABNORMAL LOW (ref 22–32)
Calcium: 9 mg/dL (ref 8.9–10.3)
Chloride: 108 mmol/L (ref 98–111)
Creatinine, Ser: 0.97 mg/dL (ref 0.44–1.00)
GFR, Estimated: >60 mL/min (ref >60)
Glucose, Bld: 104 mg/dL — ABNORMAL HIGH (ref 70–99)
Potassium: 4 mmol/L (ref 3.5–5.1)
Sodium: 139 mmol/L (ref 135–145)

## 2021-12-28 LAB — CBC
HCT: 32.9 % — ABNORMAL LOW (ref 36.0–46.0)
Hemoglobin: 10.5 g/dL — ABNORMAL LOW (ref 12.0–15.0)
MCH: 33 pg (ref 26.0–34.0)
MCHC: 31.9 g/dL (ref 30.0–36.0)
MCV: 103.5 fL — ABNORMAL HIGH (ref 80.0–100.0)
Platelets: 170 10*3/uL (ref 150–400)
RBC: 3.18 MIL/uL — ABNORMAL LOW (ref 3.87–5.11)
RDW: 18.3 % — ABNORMAL HIGH (ref 11.5–15.5)
WBC: 9.3 10*3/uL (ref 4.0–10.5)
nRBC: 0 % (ref 0.0–0.2)

## 2021-12-28 SURGERY — CYSTOSCOPY, FLEXIBLE, WITH STENT REPLACEMENT
Anesthesia: General | Laterality: Left

## 2021-12-28 MED ORDER — FENTANYL CITRATE PF 50 MCG/ML IJ SOSY: 25.0000 ug | PREFILLED_SYRINGE | INTRAMUSCULAR | Status: DC | PRN

## 2021-12-28 MED ORDER — PHENAZOPYRIDINE HCL 200 MG PO TABS: 200.0000 mg | ORAL_TABLET | Freq: Three times a day (TID) | ORAL | 0 refills | Status: DC | PRN

## 2021-12-28 MED ORDER — PROPOFOL 10 MG/ML IV BOLUS
INTRAVENOUS | Status: DC | PRN
  Administered 2021-12-28: 150 mg via INTRAVENOUS

## 2021-12-28 MED ORDER — LACTATED RINGERS IV SOLN: INTRAVENOUS | Status: DC

## 2021-12-28 MED ORDER — ACETAMINOPHEN 500 MG PO TABS
1000.0000 mg | ORAL_TABLET | Freq: Once | ORAL | Status: AC
  Administered 2021-12-28: 1000 mg via ORAL
  Filled 2021-12-28: qty 2

## 2021-12-28 MED ORDER — ONDANSETRON HCL 4 MG/2ML IJ SOLN
INTRAMUSCULAR | Status: AC
  Filled 2021-12-28: qty 2

## 2021-12-28 MED ORDER — OXYCODONE HCL 5 MG/5ML PO SOLN: 5.0000 mg | Freq: Once | ORAL | Status: DC | PRN

## 2021-12-28 MED ORDER — LIDOCAINE HCL (CARDIAC) PF 100 MG/5ML IV SOSY
PREFILLED_SYRINGE | INTRAVENOUS | Status: DC | PRN
  Administered 2021-12-28: 100 mg via INTRAVENOUS

## 2021-12-28 MED ORDER — PROPOFOL 10 MG/ML IV BOLUS
INTRAVENOUS | Status: AC
  Filled 2021-12-28: qty 20

## 2021-12-28 MED ORDER — LIDOCAINE HCL (PF) 2 % IJ SOLN
INTRAMUSCULAR | Status: AC
  Filled 2021-12-28: qty 5

## 2021-12-28 MED ORDER — MIDAZOLAM HCL 2 MG/2ML IJ SOLN
INTRAMUSCULAR | Status: AC
  Filled 2021-12-28: qty 2

## 2021-12-28 MED ORDER — GENTAMICIN SULFATE 40 MG/ML IJ SOLN
240.0000 mg | INTRAVENOUS | Status: AC
  Administered 2021-12-28: 240 mg via INTRAVENOUS
  Filled 2021-12-28: qty 6

## 2021-12-28 MED ORDER — CHLORHEXIDINE GLUCONATE 0.12 % MT SOLN
15.0000 mL | Freq: Once | OROMUCOSAL | Status: AC
  Administered 2021-12-28: 15 mL via OROMUCOSAL

## 2021-12-28 MED ORDER — LIDOCAINE HCL URETHRAL/MUCOSAL 2 % EX GEL
CUTANEOUS | Status: AC
  Filled 2021-12-28: qty 30

## 2021-12-28 MED ORDER — ORAL CARE MOUTH RINSE: 15.0000 mL | Freq: Once | OROMUCOSAL | Status: AC

## 2021-12-28 MED ORDER — MIDAZOLAM HCL 5 MG/5ML IJ SOLN
INTRAMUSCULAR | Status: DC | PRN
Start: 2021-12-28 — End: 2021-12-28
  Administered 2021-12-28: 2 mg via INTRAVENOUS

## 2021-12-28 MED ORDER — FENTANYL CITRATE (PF) 100 MCG/2ML IJ SOLN
INTRAMUSCULAR | Status: DC | PRN
  Administered 2021-12-28: 50 ug via INTRAVENOUS

## 2021-12-28 MED ORDER — OXYCODONE HCL 5 MG PO TABS: 5.0000 mg | ORAL_TABLET | Freq: Once | ORAL | Status: DC | PRN

## 2021-12-28 MED ORDER — ONDANSETRON HCL 4 MG/2ML IJ SOLN
INTRAMUSCULAR | Status: DC | PRN
  Administered 2021-12-28: 4 mg via INTRAVENOUS

## 2021-12-28 MED ORDER — STERILE WATER FOR IRRIGATION IR SOLN
Status: DC | PRN
  Administered 2021-12-28: 3000 mL

## 2021-12-28 MED ORDER — DEXAMETHASONE SODIUM PHOSPHATE 10 MG/ML IJ SOLN
INTRAMUSCULAR | Status: DC | PRN
  Administered 2021-12-28: 10 mg via INTRAVENOUS

## 2021-12-28 MED ORDER — FENTANYL CITRATE (PF) 100 MCG/2ML IJ SOLN
INTRAMUSCULAR | Status: AC
  Filled 2021-12-28: qty 2

## 2021-12-28 MED ORDER — PHENYLEPHRINE 40 MCG/ML (10ML) SYRINGE FOR IV PUSH (FOR BLOOD PRESSURE SUPPORT)
PREFILLED_SYRINGE | INTRAVENOUS | Status: DC | PRN
  Administered 2021-12-28: 40 ug via INTRAVENOUS

## 2021-12-28 MED ORDER — IOHEXOL 300 MG/ML  SOLN
INTRAMUSCULAR | Status: DC | PRN
  Administered 2021-12-28: 10 mL

## 2021-12-28 MED ORDER — DEXAMETHASONE SODIUM PHOSPHATE 10 MG/ML IJ SOLN
INTRAMUSCULAR | Status: AC
  Filled 2021-12-28: qty 1

## 2021-12-28 SURGICAL SUPPLY — 10 items
BAG URO CATCHER STRL LF (MISCELLANEOUS) ×3 IMPLANT
CATH URETL OPEN 5X70 (CATHETERS) ×3 IMPLANT
CLOTH BEACON ORANGE TIMEOUT ST (SAFETY) ×2 IMPLANT
GLOVE SURG ENC TEXT LTX SZ7.5 (GLOVE) ×3 IMPLANT
GOWN STRL REUS W/TWL XL LVL3 (GOWN DISPOSABLE) ×3 IMPLANT
GUIDEWIRE STR DUAL SENSOR (WIRE) ×3 IMPLANT
MANIFOLD NEPTUNE II (INSTRUMENTS) ×3 IMPLANT
PACK CYSTO (CUSTOM PROCEDURE TRAY) ×3 IMPLANT
STENT URET 6FRX24 CONTOUR (STENTS) ×1 IMPLANT
TUBING CONNECTING 10 (TUBING) ×1 IMPLANT

## 2021-12-28 NOTE — Anesthesia Procedure Notes (Signed)
Procedure Name: LMA Insertion Date/Time: 12/28/2021 7:37 AM Performed by: Lind Covert, CRNA Pre-anesthesia Checklist: Patient identified, Emergency Drugs available, Suction available, Patient being monitored and Timeout performed Patient Re-evaluated:Patient Re-evaluated prior to induction Oxygen Delivery Method: Circle system utilized Preoxygenation: Pre-oxygenation with 100% oxygen Induction Type: IV induction LMA: LMA inserted LMA Size: 4.0 Tube type: Oral Number of attempts: 1 Placement Confirmation: positive ETCO2 and breath sounds checked- equal and bilateral Tube secured with: Tape Dental Injury: Teeth and Oropharynx as per pre-operative assessment

## 2021-12-28 NOTE — Transfer of Care (Signed)
Immediate Anesthesia Transfer of Care Note  Patient: Anna [name]  Procedure(s) Performed: CYSTOSCOPY WITH BILATURIARETROGRADE PYELOGRAM LEFT URETERAL STENT EXCHANGE, URETEROSCOPY (Left) URETEROSCOPY  Patient Location: PACU  Anesthesia Type:General  Level of Consciousness: sedated  Airway & Oxygen Therapy: Patient Spontanous Breathing and Patient connected to face mask oxygen  Post-op Assessment: Report given to RN and Post -op Vital signs reviewed and stable  Post vital signs: Reviewed and stable  Last Vitals:  Vitals Value Taken Time  BP    Temp    Pulse 81 12/28/21 0813  Resp 12 12/28/21 0813  SpO2 100 % 12/28/21 0813  Vitals shown include unvalidated device data.  Last Pain:  Vitals:   12/28/21 0537  TempSrc: Oral         Complications: No notable events documented.

## 2021-12-28 NOTE — Op Note (Signed)
Preoperative diagnosis:  Metastatic fallopian tube cancer with occlusion of the left ureter  Postoperative diagnosis:  Same  Procedure: Cystoscopy, bilateral retrograde pyelogram with interpretation Left ureteroscopy Left ureteral stent exchange  Surgeon: Ardis Hughs, MD  Anesthesia: General  Complications: None  Intraoperative findings:  #1: The patient's right retrograde pyelogram was performed with 10 cc of Omnipaque contrast and demonstrated normal caliber ureter with no filling defects or abnormalities.  There is no hydronephrosis. #2: The patient's left ureteral stent appeared to be in good condition with minimal encrustation. #3: The patient's left retrograde pyelogram demonstrated filling defect with some ureteral narrowing approximately 4 cm proximal to the UVJ.  The area involved appears to be approximately 2 and half centimeters long.  There was no proximal hydroureteronephrosis with no additional filling defects or significant abnormalities.  The calyces were sharp. #5: Had a difficult time replacing the wire after performing the retrograde pyelogram once the stent had been removed.  This required ureteroscopy to advance the wire across the obstructed segment.  Once the segment had been passed the remaining aspect of the ureter was easily passable and the stent was noted to be in good position based on the r fluoroscopy.  EBL: Minimal  Specimens: None  Indication: Anna [name] is a 66 y.o. patient with metastatic fallopian tube cancer with left hydronephrosis secondary to extrinsic compression from her malignancy.  She had a stent placed approximately 5 months ago, and has tolerated it well..  After reviewing the management options for treatment, he elected to proceed with the above surgical procedure(s). We have discussed the potential benefits and risks of the procedure, side effects of the proposed treatment, the likelihood of the patient achieving the  goals of the procedure, and any potential problems that might occur during the procedure or recuperation. Informed consent has been obtained.  Description of procedure:  The patient was taken to the operating room and general anesthesia was induced.  The patient was placed in the dorsal lithotomy position, prepped and draped in the usual sterile fashion, and preoperative antibiotics were administered. A preoperative time-out was performed.   21 French degree cystoscope was then passed to the patient's urethra into the bladder under visual guidance.  Cystoscopy was then performed and varices grade fashion noting no significant abnormalities.  Stent was noted to be emanating from the left ureteral orifice and there was minimal edema in that area.  A retrograde pyelogram was then performed with a 5 [place] open-ended ureteral catheter in the right ureter by cannulating the right ureteral orifice.  10 cc on the contrast was instilled with the above findings.  Subsequently grasped the stent from the patient's left ureteral orifice and puts urethral meatus.  Was then able to easily advance a sensor wire through the stent and up into the left renal pelvis.  I subsequently exchanged the wire for the open-ended catheter and pulled it down to the distal ureter before performing a retrograde pyelogram.  The above findings were noted.  I then attempted to advance the wire again through the open-ended catheter and met quite a bit of resistance and curling distal to the obstruction.  Then remove the wire and used a semirigid ureteroscope to cannulate the left ureter and advanced it into the distal ureter.  The above findings were noted there was a firm mass that was difficult to navigate across it with the scope.  As result I was unable to advance the scope beyond it, but was able to advance  a sensor wire across it.  Once I was able to get the stent across the obstructed segment and into the left renal pelvis I removed the  scope over the wire.  I subsequently backloaded the wire through the cystoscope and repassed cystoscope into the patient's bladder.  Then using the Seldinger technique I was able to advance the stent across the obstructed segment and into the left renal pelvis.  It was noted to be in excellent position via fluoroscopy.  Once it was noted to be well within the renal pelvis I advanced a stent to the bladder neck prior to removing the wire.  A nice curl was noted within the bladder as well.  The bladder was subsequently emptied and the patient was extubated and returned to PACU in stable condition.  Disposition: The patient will be scheduled for follow-up with me in 5 months to discuss stent exchange.  These results have been relayed to her oncologist Dr. Julieanne Manson.  Ardis Hughs, M.D.

## 2021-12-28 NOTE — Anesthesia Postprocedure Evaluation (Signed)
Anesthesia Post Note  Patient: Anna [name]  Procedure(s) Performed: CYSTOSCOPY WITH BILATURIARETROGRADE PYELOGRAM LEFT URETERAL STENT EXCHANGE, URETEROSCOPY (Left) URETEROSCOPY     Patient location during evaluation: PACU Anesthesia Type: General Level of consciousness: awake and alert Pain management: pain level controlled Vital Signs Assessment: post-procedure vital signs reviewed and stable Respiratory status: spontaneous breathing, nonlabored ventilation and respiratory function stable Cardiovascular status: blood pressure returned to baseline Postop Assessment: no apparent nausea or vomiting Anesthetic complications: no   No notable events documented.  Last Vitals:  Vitals:   12/28/21 0830 12/28/21 0845  BP: 106/61 119/73  Pulse: 83 81  Resp: 18 (!) 21  Temp:  36.6 C  SpO2: 94% 96%    Last Pain:  Vitals:   12/28/21 0845  TempSrc:   PainSc: 0-No pain                 Marthenia Rolling

## 2021-12-28 NOTE — H&P (Signed)
66 year old female presents today for discussion of left ureteral obstruction. The patient has a history of fallopian tube cancer 2018. All told she has had 24 cycles of chemotherapy. Her most recent CT scan demonstrated a soft tissue mass in the left pelvis causing obstruction of the left ureter. Fortunately she is not having any associated pain. She does have slightly worsening renal failure. The patient otherwise has no real significant past medical history.   Interval: The patient underwent left ureteral stent placement about 3 months ago. Since that time she has been tolerating the stent without any significant difficulty. She denies any dysuria hematuria. She denies any incontinence. She denies any pain, frequency, or urgency.   The patient did have a CAT scan done yesterday for evaluation of her fallopian tube carcinoma. Unfortunately this did demonstrate progression of her metastatic disease. However, there was no hydroureteronephrosis.     ALLERGIES: Bee stings - Hives, Swelling, Trouble Breathing    MEDICATIONS: Omeprazole 20 mg capsule,delayed release  AMLODIPINE BESYLATE PO Daily  BUPROPION HCL SR PO Daily  Levothyroxine 112 mcg capsule  Lidocaine-Prilocaine  Norvasc 5 mg tablet  Ocuvite Blue Light  OMEPRAZOLE PO Daily  ONDANSETRON HCL PO PRN  Rosuvastatin Calcium 5 mg tablet  Wellbutrin Xl 300 mg tablet, extended release 24 hr  Xarelto 20 mg tablet  Zofran     GU PSH: Cystoscopy Insert Stent - 07/18/2021 Hysterectomy - about 2018     NON-GU PSH: Appendectomy Cataract Surgery.. - about 2018 Cesarean Delivery - about 1985 Hysterectomy     GU PMH: Hydronephrosis - 07/17/2021 Malignant neoplasm of unspecified fallopian tube - 2018      PMH Notes: cancer of fallopian tubes  retinitis pigmentosa  hypothyroidism   NON-GU PMH: Arthritis GERD Hypercholesterolemia Hypertension Hypothyroidism Sleep Apnea    FAMILY HISTORY: 1 Daughter - Daughter 1 son -  Son Arthritis - Runs In Family Breast Cancer - Cousin Cervical Cancer - Grandmother Congestive Heart Failure - Father Diabetes - Grandfather, Grandfather Heart Attack - Runs In Family Heart Disease - Runs In Family, Runs in Family High Blood Pressure - Runs In Family Kidney Stones - Brother, Father   SOCIAL HISTORY: Marital Status: Married Preferred Language: English; Ethnicity: Not Hispanic Or Latino; Race: White Current Smoking Status: Patient has never smoked.   Tobacco Use Assessment Completed: Used Tobacco in last 30 days? Does not use smokeless tobacco. Has never drank.  Does not use drugs. Drinks 1 caffeinated drink per day. Has not had a blood transfusion. Patient's occupation is/was Retired.    REVIEW OF SYSTEMS:    GU Review Female:   Patient denies frequent urination, hard to postpone urination, burning /pain with urination, get up at night to urinate, leakage of urine, stream starts and stops, trouble starting your stream, have to strain to urinate, and being pregnant.  Gastrointestinal (Upper):   Patient denies nausea, vomiting, and indigestion/ heartburn.  Gastrointestinal (Lower):   Patient denies diarrhea and constipation.  Constitutional:   Patient denies fever, night sweats, weight loss, and fatigue.  Skin:   Patient denies skin rash/ lesion and itching.  Eyes:   Patient denies blurred vision and double vision.  Ears/ Nose/ Throat:   Patient denies sore throat and sinus problems.  Hematologic/Lymphatic:   Patient denies swollen glands and easy bruising.  Cardiovascular:   Patient denies leg swelling and chest pains.  Respiratory:   Patient denies cough and shortness of breath.  Endocrine:   Patient denies excessive thirst.  Musculoskeletal:  Patient denies back pain and joint pain.  Neurological:   Patient denies headaches and dizziness.  Psychologic:   Patient denies depression and anxiety.   VITAL SIGNS:      10/16/2021 08:45 AM  Weight 174 lb / 78.93 kg   Height 63 in / 160.02 cm  BP 127/83 mmHg  Pulse 85 /min  Temperature 97.7 F / 36.5 C  BMI 30.8 kg/m   Complexity of Data:  Source Of History:  Patient  Records Review:   Previous Doctor Records, Previous Patient Records, POC Tool  Urine Test Review:   Urinalysis  X-Ray Review: C.T. Abdomen/Pelvis: Reviewed Films. Discussed With Patient.     PROCEDURES: None   ASSESSMENT:      ICD-10 Details  1 GU:   Hydronephrosis - N13.0   2   Malignant neoplasm of unspecified fallopian tube - C57.00      PLAN:           Document Letter(s):  Created for Patient: Clinical Summary         Notes:   The patient is done very well from a urologic perspective with no pain or symptoms from her stent. Her renal function seems to be stable and she has no hydronephrosis. Unfortunately she has had some progression of her cancer. Our plan moving forward is to change her stent in February 2023. This will be around the 64-month period. I did recommend that if she get into trouble before that with pain or symptoms of infection or bleeding that we would need to change it sooner than that.

## 2021-12-28 NOTE — Discharge Instructions (Signed)
DISCHARGE INSTRUCTIONS FOR KIDNEY STONE/URETERAL STENT   MEDICATIONS:  1.  Resume all your other meds from home - except do not take any extra narcotic pain meds that you may have at home.  2. Pyridium is to help with the burning/stinging when you urinate. 3. Take Cipro as prescribed.  ACTIVITY:  1. No strenuous activity x 1week  2. No driving while on narcotic pain medications  3. Drink plenty of water  4. Continue to walk at home - you can still get blood clots when you are at home, so keep active, but don't over do it.  5. May return to work/school tomorrow or when you feel ready   BATHING:  1. You can shower and we recommend daily showers    SIGNS/SYMPTOMS TO CALL:  Please call us if you have a fever greater than 101.5, uncontrolled nausea/vomiting, uncontrolled pain, dizziness, unable to urinate, bloody urine, chest pain, shortness of breath, leg swelling, leg pain, redness around wound, drainage from wound, or any other concerns or questions.   You can reach [place] at [phone].   FOLLOW-UP:  1. You have an appointment in 5 months to discuss stent exchange.

## 2021-12-28 NOTE — Interval H&P Note (Signed)
History and Physical Interval Note:  12/28/2021 7:25 AM  Anna [name]  has presented today for surgery, with the diagnosis of LEFT HYDRONEPHROSIS.  The various methods of treatment have been discussed with the patient and family. After consideration of risks, benefits and other options for treatment, the patient has consented to  Procedure(s): [place] (Left) as a surgical intervention.  The patient's history has been reviewed, patient examined, no change in status, stable for surgery.  I have reviewed the patient's chart and labs.  Questions were answered to the patient's satisfaction.     Ardis Hughs

## 2021-12-29 ENCOUNTER — Encounter (HOSPITAL_COMMUNITY): Payer: Self-pay | Admitting: Urology

## 2022-01-01 ENCOUNTER — Inpatient Hospital Stay: Payer: Medicare Other

## 2022-01-01 ENCOUNTER — Other Ambulatory Visit: Payer: BLUE CROSS/BLUE SHIELD

## 2022-01-01 ENCOUNTER — Other Ambulatory Visit: Payer: Self-pay

## 2022-01-01 VITALS — BP 135/89 | HR 81 | Temp 98.0°F | Resp 18 | Ht 63.0 in | Wt 172.0 lb

## 2022-01-01 DIAGNOSIS — C5702 Malignant neoplasm of left fallopian tube: Secondary | ICD-10-CM

## 2022-01-01 MED ORDER — SODIUM CHLORIDE 0.9 % IV SOLN: Freq: Once | INTRAVENOUS | Status: AC

## 2022-01-01 MED ORDER — SODIUM CHLORIDE 0.9 % IV SOLN
10.0000 mg/kg | Freq: Once | INTRAVENOUS | Status: AC
  Administered 2022-01-01: 800 mg via INTRAVENOUS
  Filled 2022-01-01: qty 32

## 2022-01-01 MED ORDER — HEPARIN SOD (PORK) LOCK FLUSH 100 UNIT/ML IV SOLN
500.0000 [IU] | Freq: Once | INTRAVENOUS | Status: AC | PRN
  Administered 2022-01-01: 500 [IU]

## 2022-01-01 MED ORDER — SODIUM CHLORIDE 0.9% FLUSH
10.0000 mL | INTRAVENOUS | Status: DC | PRN
  Administered 2022-01-01: 10 mL

## 2022-01-01 NOTE — Patient Instructions (Signed)
[facility]   Discharge Instructions: Thank you for choosing [facility] to provide your oncology and hematology care.   If you have a lab appointment with the [facility], please go directly to the [facility] and check in at the registration area.   Wear comfortable clothing and clothing appropriate for easy access to any Portacath or PICC line.   We strive to give you quality time with your provider. You may need to reschedule your appointment if you arrive late (15 or more minutes).  Arriving late affects you and other patients whose appointments are after yours.  Also, if you miss three or more appointments without notifying the office, you may be dismissed from the clinic at the providers discretion.      For prescription refill requests, have your pharmacy contact our office and allow 72 hours for refills to be completed.    Today you received the following chemotherapy and/or immunotherapy agents Bevacizumab-awwb (MVASI).      To help prevent nausea and vomiting after your treatment, we encourage you to take your nausea medication as directed.  BELOW ARE SYMPTOMS THAT SHOULD BE REPORTED IMMEDIATELY: *FEVER GREATER THAN 100.4 F (38 C) OR HIGHER *CHILLS OR SWEATING *NAUSEA AND VOMITING THAT IS NOT CONTROLLED WITH YOUR NAUSEA MEDICATION *UNUSUAL SHORTNESS OF BREATH *UNUSUAL BRUISING OR BLEEDING *URINARY PROBLEMS (pain or burning when urinating, or frequent urination) *BOWEL PROBLEMS (unusual diarrhea, constipation, pain near the anus) TENDERNESS IN MOUTH AND THROAT WITH OR WITHOUT PRESENCE OF ULCERS (sore throat, sores in mouth, or a toothache) UNUSUAL RASH, SWELLING OR PAIN  UNUSUAL VAGINAL DISCHARGE OR ITCHING   Items with * indicate a potential emergency and should be followed up as soon as possible or go to the Emergency Department if any problems should occur.  Please show the CHEMOTHERAPY ALERT CARD or IMMUNOTHERAPY ALERT CARD  at check-in to the Emergency Department and triage nurse.  Should you have questions after your visit or need to cancel or reschedule your appointment, please contact [facility]  Dept: [phone]  and follow the prompts.  Office hours are 8:00 a.m. to 4:30 p.m. Monday - Friday. Please note that voicemails left after 4:00 p.m. may not be returned until the following business day.  We are closed weekends and major holidays. You have access to a nurse at all times for urgent questions. Please call the main number to the clinic Dept: [phone] and follow the prompts.   For any non-urgent questions, you may also contact your provider using MyChart. We now offer e-Visits for anyone 58 and older to request care online for non-urgent symptoms. For details visit [email or web address].   Also download the MyChart app! Go to the app store, search "MyChart", open the app, select [facility], and log in with your MyChart username and password.  Due to Covid, a mask is required upon entering the hospital/clinic. If you do not have a mask, one will be given to you upon arrival. For doctor visits, patients may have 1 support person aged 20 or older with them. For treatment visits, patients cannot have anyone with them due to current Covid guidelines and our immunocompromised population.   Bevacizumab injection What is this medication? BEVACIZUMAB (be va SIZ yoo mab) is a monoclonal antibody. It is used to treat many types of cancer. This medicine may be used for other purposes; ask your health care provider or pharmacist if you have questions. COMMON BRAND NAME(S):  Alymsys, Avastin, MVASI, Noah Charon What should I tell my care team before I take this medication? They need to know if you have any of these conditions: diabetes heart disease high blood pressure history of coughing up blood prior anthracycline chemotherapy (e.g., doxorubicin, daunorubicin, epirubicin) recent or  ongoing radiation therapy recent or planning to have surgery stroke an unusual or allergic reaction to bevacizumab, hamster proteins, mouse proteins, other medicines, foods, dyes, or preservatives pregnant or trying to get pregnant breast-feeding How should I use this medication? This medicine is for infusion into a vein. It is given by a health care professional in a hospital or clinic setting. Talk to your pediatrician regarding the use of this medicine in children. Special care may be needed. Overdosage: If you think you have taken too much of this medicine contact a poison control center or emergency room at once. NOTE: This medicine is only for you. Do not share this medicine with others. What if I miss a dose? It is important not to miss your dose. Call your doctor or health care professional if you are unable to keep an appointment. What may interact with this medication? Interactions are not expected. This list may not describe all possible interactions. Give your health care provider a list of all the medicines, herbs, non-prescription drugs, or dietary supplements you use. Also tell them if you smoke, drink alcohol, or use illegal drugs. Some items may interact with your medicine. What should I watch for while using this medication? Your condition will be monitored carefully while you are receiving this medicine. You will need important blood work and urine testing done while you are taking this medicine. This medicine may increase your risk to bruise or bleed. Call your doctor or health care professional if you notice any unusual bleeding. Before having surgery, talk to your health care provider to make sure it is ok. This drug can increase the risk of poor healing of your surgical site or wound. You will need to stop this drug for 28 days before surgery. After surgery, wait at least 28 days before restarting this drug. Make sure the surgical site or wound is healed enough before  restarting this drug. Talk to your health care provider if questions. Do not become pregnant while taking this medicine or for 6 months after stopping it. Women should inform their doctor if they wish to become pregnant or think they might be pregnant. There is a potential for serious side effects to an unborn child. Talk to your health care professional or pharmacist for more information. Do not breast-feed an infant while taking this medicine and for 6 months after the last dose. This medicine has caused ovarian failure in some women. This medicine may interfere with the ability to have a child. You should talk to your doctor or health care professional if you are concerned about your fertility. What side effects may I notice from receiving this medication? Side effects that you should report to your doctor or health care professional as soon as possible: allergic reactions like skin rash, itching or hives, swelling of the face, lips, or tongue chest pain or chest tightness chills coughing up blood high fever seizures severe constipation signs and symptoms of bleeding such as bloody or black, tarry stools; red or dark-brown urine; spitting up blood or brown material that looks like coffee grounds; red spots on the skin; unusual bruising or bleeding from the eye, gums, or nose signs and symptoms of a blood clot such as  breathing problems; chest pain; severe, sudden headache; pain, swelling, warmth in the leg signs and symptoms of a stroke like changes in vision; confusion; trouble speaking or understanding; severe headaches; sudden numbness or weakness of the face, arm or leg; trouble walking; dizziness; loss of balance or coordination stomach pain sweating swelling of legs or ankles vomiting weight gain Side effects that usually do not require medical attention (report to your doctor or health care professional if they continue or are bothersome): back pain changes in taste decreased  appetite dry skin nausea tiredness This list may not describe all possible side effects. Call your doctor for medical advice about side effects. You may report side effects to FDA at 1-800-FDA-1088. Where should I keep my medication? This drug is given in a hospital or clinic and will not be stored at home. NOTE: This sheet is a summary. It may not cover all possible information. If you have questions about this medicine, talk to your doctor, pharmacist, or health care provider.  2022 Elsevier/Gold Standard (2021-07-24 00:00:00)

## 2022-01-01 NOTE — Progress Notes (Signed)
Patient presents for treatment. RN assessment completed along with the following:  Labs/vitals reviewed - Yes, and within treatment parameters.   Weight within 10% of previous measurement - Yes Informed consent completed and reflects current therapy/intent - Yes, on date 10/23/2021             Provider progress note reviewed - Patient not seen by provider today. Most recent note dated 12/20/2020 reviewed. Treatment/Antibody/Supportive plan reviewed - Yes, and there are no adjustments needed for today's treatment. S&H and other orders reviewed - Yes, and there are no additional orders identified. Previous treatment date reviewed - Yes, and the appropriate amount of time has elapsed between treatments. Clinic Hand Off Received from - No  Patient to proceed with treatment.

## 2022-01-13 ENCOUNTER — Other Ambulatory Visit: Payer: Self-pay | Admitting: Oncology

## 2022-01-15 ENCOUNTER — Inpatient Hospital Stay: Payer: Medicare Other

## 2022-01-15 ENCOUNTER — Other Ambulatory Visit: Payer: Self-pay

## 2022-01-15 ENCOUNTER — Inpatient Hospital Stay (HOSPITAL_BASED_OUTPATIENT_CLINIC_OR_DEPARTMENT_OTHER): Payer: Medicare Other | Admitting: Oncology

## 2022-01-15 ENCOUNTER — Encounter: Payer: Self-pay | Admitting: *Deleted

## 2022-01-15 VITALS — BP 148/96 | HR 87 | Temp 97.5°F | Resp 18

## 2022-01-15 VITALS — BP 153/92 | HR 94 | Temp 97.8°F | Resp 20 | Ht 63.0 in | Wt 173.8 lb

## 2022-01-15 DIAGNOSIS — C5702 Malignant neoplasm of left fallopian tube: Secondary | ICD-10-CM

## 2022-01-15 LAB — CBC WITH DIFFERENTIAL (CANCER CENTER ONLY)
Abs Immature Granulocytes: 0.02 10*3/uL (ref 0.00–0.07)
Basophils Absolute: 0 10*3/uL (ref 0.0–0.1)
Basophils Relative: 1 %
Eosinophils Absolute: 0.1 10*3/uL (ref 0.0–0.5)
Eosinophils Relative: 1 %
HCT: 33.2 % — ABNORMAL LOW (ref 36.0–46.0)
Hemoglobin: 10.7 g/dL — ABNORMAL LOW (ref 12.0–15.0)
Immature Granulocytes: 0 %
Lymphocytes Relative: 53 %
Lymphs Abs: 3 10*3/uL (ref 0.7–4.0)
MCH: 33.5 pg (ref 26.0–34.0)
MCHC: 32.2 g/dL (ref 30.0–36.0)
MCV: 104.1 fL — ABNORMAL HIGH (ref 80.0–100.0)
Monocytes Absolute: 0.8 10*3/uL (ref 0.1–1.0)
Monocytes Relative: 14 %
Neutro Abs: 1.8 10*3/uL (ref 1.7–7.7)
Neutrophils Relative %: 31 %
Platelet Count: 215 10*3/uL (ref 150–400)
RBC: 3.19 MIL/uL — ABNORMAL LOW (ref 3.87–5.11)
RDW: 17.3 % — ABNORMAL HIGH (ref 11.5–15.5)
WBC Count: 5.6 10*3/uL (ref 4.0–10.5)
nRBC: 0 % (ref 0.0–0.2)

## 2022-01-15 LAB — CMP (CANCER CENTER ONLY)
ALT: 17 U/L (ref 0–44)
AST: 21 U/L (ref 15–41)
Albumin: 3.9 g/dL (ref 3.5–5.0)
Alkaline Phosphatase: 121 U/L (ref 38–126)
Anion gap: 9 (ref 5–15)
BUN: 19 mg/dL (ref 8–23)
CO2: 24 mmol/L (ref 22–32)
Calcium: 9.3 mg/dL (ref 8.9–10.3)
Chloride: 105 mmol/L (ref 98–111)
Creatinine: 1.18 mg/dL — ABNORMAL HIGH (ref 0.44–1.00)
GFR, Estimated: 51 mL/min — ABNORMAL LOW (ref >60)
Glucose, Bld: 93 mg/dL (ref 70–99)
Potassium: 4.1 mmol/L (ref 3.5–5.1)
Sodium: 138 mmol/L (ref 135–145)
Total Bilirubin: 0.3 mg/dL (ref 0.3–1.2)
Total Protein: 6.7 g/dL (ref 6.5–8.1)

## 2022-01-15 LAB — TOTAL PROTEIN, URINE DIPSTICK: Protein, ur: NEGATIVE mg/dL

## 2022-01-15 MED ORDER — DEXTROSE 5 % IV SOLN: Freq: Once | INTRAVENOUS | Status: AC

## 2022-01-15 MED ORDER — DOXORUBICIN HCL LIPOSOMAL CHEMO INJECTION 2 MG/ML
50.0000 mg | Freq: Once | INTRAVENOUS | Status: AC
  Administered 2022-01-15: 50 mg via INTRAVENOUS
  Filled 2022-01-15: qty 25

## 2022-01-15 MED ORDER — SODIUM CHLORIDE 0.9 % IV SOLN: Freq: Once | INTRAVENOUS | Status: AC

## 2022-01-15 MED ORDER — SODIUM CHLORIDE 0.9 % IV SOLN
10.0000 mg | Freq: Once | INTRAVENOUS | Status: AC
  Administered 2022-01-15: 10 mg via INTRAVENOUS
  Filled 2022-01-15: qty 1

## 2022-01-15 MED ORDER — HEPARIN SOD (PORK) LOCK FLUSH 100 UNIT/ML IV SOLN
500.0000 [IU] | Freq: Once | INTRAVENOUS | Status: AC | PRN
  Administered 2022-01-15: 500 [IU]

## 2022-01-15 MED ORDER — SODIUM CHLORIDE 0.9 % IV SOLN
10.0000 mg/kg | Freq: Once | INTRAVENOUS | Status: AC
  Administered 2022-01-15: 800 mg via INTRAVENOUS
  Filled 2022-01-15: qty 32

## 2022-01-15 MED ORDER — SODIUM CHLORIDE 0.9% FLUSH
10.0000 mL | INTRAVENOUS | Status: DC | PRN
  Administered 2022-01-15: 10 mL

## 2022-01-15 NOTE — Patient Instructions (Signed)
[facility]  Discharge Instructions: Thank you for choosing [facility] to provide your oncology and hematology care.   If you have a lab appointment with the [facility], please go directly to the [facility] and check in at the registration area.   Wear comfortable clothing and clothing appropriate for easy access to any Portacath or PICC line.   We strive to give you quality time with your provider. You may need to reschedule your appointment if you arrive late (15 or more minutes).  Arriving late affects you and other patients whose appointments are after yours.  Also, if you miss three or more appointments without notifying the office, you may be dismissed from the clinic at the providers discretion.      For prescription refill requests, have your pharmacy contact our office and allow 72 hours for refills to be completed.    Today you received the following chemotherapy and/or immunotherapy agents Bevacizumab-awwb, Doxorubicin Liposomal      To help prevent nausea and vomiting after your treatment, we encourage you to take your nausea medication as directed.  BELOW ARE SYMPTOMS THAT SHOULD BE REPORTED IMMEDIATELY: *FEVER GREATER THAN 100.4 F (38 C) OR HIGHER *CHILLS OR SWEATING *NAUSEA AND VOMITING THAT IS NOT CONTROLLED WITH YOUR NAUSEA MEDICATION *UNUSUAL SHORTNESS OF BREATH *UNUSUAL BRUISING OR BLEEDING *URINARY PROBLEMS (pain or burning when urinating, or frequent urination) *BOWEL PROBLEMS (unusual diarrhea, constipation, pain near the anus) TENDERNESS IN MOUTH AND THROAT WITH OR WITHOUT PRESENCE OF ULCERS (sore throat, sores in mouth, or a toothache) UNUSUAL RASH, SWELLING OR PAIN  UNUSUAL VAGINAL DISCHARGE OR ITCHING   Items with * indicate a potential emergency and should be followed up as soon as possible or go to the Emergency Department if any problems should occur.  Please show the CHEMOTHERAPY ALERT CARD or IMMUNOTHERAPY  ALERT CARD at check-in to the Emergency Department and triage nurse.  Should you have questions after your visit or need to cancel or reschedule your appointment, please contact [facility]  Dept: [phone]  and follow the prompts.  Office hours are 8:00 a.m. to 4:30 p.m. Monday - Friday. Please note that voicemails left after 4:00 p.m. may not be returned until the following business day.  We are closed weekends and major holidays. You have access to a nurse at all times for urgent questions. Please call the main number to the clinic Dept: [phone] and follow the prompts.   For any non-urgent questions, you may also contact your provider using MyChart. We now offer e-Visits for anyone 21 and older to request care online for non-urgent symptoms. For details visit [email or web address].   Also download the MyChart app! Go to the app store, search "MyChart", open the app, select [facility], and log in with your MyChart username and password.  Due to Covid, a mask is required upon entering the hospital/clinic. If you do not have a mask, one will be given to you upon arrival. For doctor visits, patients may have 1 support person aged 73 or older with them. For treatment visits, patients cannot have anyone with them due to current Covid guidelines and our immunocompromised population.   Bevacizumab injection What is this medication? BEVACIZUMAB (be va SIZ yoo mab) is a monoclonal antibody. It is used to treat many types of cancer. This medicine may be used for other purposes; ask your health care provider or pharmacist if you have questions. COMMON BRAND NAME(S):  Alymsys, Avastin, MVASI, Noah Charon What should I tell my care team before I take this medication? They need to know if you have any of these conditions: diabetes heart disease high blood pressure history of coughing up blood prior anthracycline chemotherapy (e.g., doxorubicin, daunorubicin,  epirubicin) recent or ongoing radiation therapy recent or planning to have surgery stroke an unusual or allergic reaction to bevacizumab, hamster proteins, mouse proteins, other medicines, foods, dyes, or preservatives pregnant or trying to get pregnant breast-feeding How should I use this medication? This medicine is for infusion into a vein. It is given by a health care professional in a hospital or clinic setting. Talk to your pediatrician regarding the use of this medicine in children. Special care may be needed. Overdosage: If you think you have taken too much of this medicine contact a poison control center or emergency room at once. NOTE: This medicine is only for you. Do not share this medicine with others. What if I miss a dose? It is important not to miss your dose. Call your doctor or health care professional if you are unable to keep an appointment. What may interact with this medication? Interactions are not expected. This list may not describe all possible interactions. Give your health care provider a list of all the medicines, herbs, non-prescription drugs, or dietary supplements you use. Also tell them if you smoke, drink alcohol, or use illegal drugs. Some items may interact with your medicine. What should I watch for while using this medication? Your condition will be monitored carefully while you are receiving this medicine. You will need important blood work and urine testing done while you are taking this medicine. This medicine may increase your risk to bruise or bleed. Call your doctor or health care professional if you notice any unusual bleeding. Before having surgery, talk to your health care provider to make sure it is ok. This drug can increase the risk of poor healing of your surgical site or wound. You will need to stop this drug for 28 days before surgery. After surgery, wait at least 28 days before restarting this drug. Make sure the surgical site or wound is  healed enough before restarting this drug. Talk to your health care provider if questions. Do not become pregnant while taking this medicine or for 6 months after stopping it. Women should inform their doctor if they wish to become pregnant or think they might be pregnant. There is a potential for serious side effects to an unborn child. Talk to your health care professional or pharmacist for more information. Do not breast-feed an infant while taking this medicine and for 6 months after the last dose. This medicine has caused ovarian failure in some women. This medicine may interfere with the ability to have a child. You should talk to your doctor or health care professional if you are concerned about your fertility. What side effects may I notice from receiving this medication? Side effects that you should report to your doctor or health care professional as soon as possible: allergic reactions like skin rash, itching or hives, swelling of the face, lips, or tongue chest pain or chest tightness chills coughing up blood high fever seizures severe constipation signs and symptoms of bleeding such as bloody or black, tarry stools; red or dark-brown urine; spitting up blood or brown material that looks like coffee grounds; red spots on the skin; unusual bruising or bleeding from the eye, gums, or nose signs and symptoms of a blood clot such as  breathing problems; chest pain; severe, sudden headache; pain, swelling, warmth in the leg signs and symptoms of a stroke like changes in vision; confusion; trouble speaking or understanding; severe headaches; sudden numbness or weakness of the face, arm or leg; trouble walking; dizziness; loss of balance or coordination stomach pain sweating swelling of legs or ankles vomiting weight gain Side effects that usually do not require medical attention (report to your doctor or health care professional if they continue or are bothersome): back pain changes in  taste decreased appetite dry skin nausea tiredness This list may not describe all possible side effects. Call your doctor for medical advice about side effects. You may report side effects to FDA at 1-800-FDA-1088. Where should I keep my medication? This drug is given in a hospital or clinic and will not be stored at home. NOTE: This sheet is a summary. It may not cover all possible information. If you have questions about this medicine, talk to your doctor, pharmacist, or health care provider.  2022 Elsevier/Gold Standard (2021-07-24 00:00:00)  Doxorubicin Liposomal injection What is this medication? LIPOSOMAL DOXORUBICIN (LIP oh som al  dox oh ROO bi sin) is a chemotherapy drug. This medicine is used to treat many kinds of cancer like Kaposi's sarcoma, multiple myeloma, and ovarian cancer. This medicine may be used for other purposes; ask your health care provider or pharmacist if you have questions. COMMON BRAND NAME(S): Doxil, Lipodox What should I tell my care team before I take this medication? They need to know if you have any of these conditions: blood disorders heart disease infection (especially a virus infection such as chickenpox, cold sores, or herpes) liver disease recent or ongoing radiation therapy an unusual or allergic reaction to doxorubicin, other chemotherapy agents, soybeans, other medicines, foods, dyes, or preservatives pregnant or trying to get pregnant breast-feeding How should I use this medication? This drug is given as an infusion into a vein. It is administered in a hospital or clinic by a specially trained health care professional. If you have pain, swelling, burning or any unusual feeling around the site of your injection, tell your health care professional right away. Talk to your pediatrician regarding the use of this medicine in children. Special care may be needed. Overdosage: If you think you have taken too much of this medicine contact a poison  control center or emergency room at once. NOTE: This medicine is only for you. Do not share this medicine with others. What if I miss a dose? It is important not to miss your dose. Call your doctor or health care professional if you are unable to keep an appointment. What may interact with this medication? Do not take this medicine with any of the following medications: zidovudine This medicine may also interact with the following medications: medicines to increase blood counts like filgrastim, pegfilgrastim, sargramostim vaccines Talk to your doctor or health care professional before taking any of these medicines: acetaminophen aspirin ibuprofen ketoprofen naproxen This list may not describe all possible interactions. Give your health care provider a list of all the medicines, herbs, non-prescription drugs, or dietary supplements you use. Also tell them if you smoke, drink alcohol, or use illegal drugs. Some items may interact with your medicine. What should I watch for while using this medication? Your condition will be monitored carefully while you are receiving this medicine. You may need blood work done while you are taking this medicine. This drug may make you feel generally unwell. This is not uncommon, as chemotherapy  can affect healthy cells as well as cancer cells. Report any side effects. Continue your course of treatment even though you feel ill unless your doctor tells you to stop. Your urine may turn orange-red for a few days after your dose. This is not blood. If your urine is dark or brown, call your doctor. In some cases, you may be given additional medicines to help with side effects. Follow all directions for their use. Talk to your doctor about your risk of cancer. You may be more at risk for certain types of cancers if you take this medicine. Do not become pregnant while taking this medicine or for 6 months after stopping it. Women should inform their healthcare  professional if they wish to become pregnant or think they may be pregnant. Men should not father a child while taking this medicine and for 6 months after stopping it. There is a potential for serious side effects to an unborn child. Talk to your health care professional or pharmacist for more information. Do not breast-feed an infant while taking this medicine. This medicine has caused ovarian failure in some women. This medicine may make it more difficult to get pregnant. Talk to your healthcare professional if you are concerned about your fertility. This medicine has caused decreased sperm counts in some men. This may make it more difficult to father a child. Talk to your healthcare professional if you are concerned about your fertility. This medicine may cause a decrease in Co-Enzyme Q-10. You should make sure that you get enough Co-Enzyme Q-10 while you are taking this medicine. Discuss the foods you eat and the vitamins you take with your health care professional. What side effects may I notice from receiving this medication? Side effects that you should report to your doctor or health care professional as soon as possible: allergic reactions like skin rash, itching or hives, swelling of the face, lips, or tongue low blood counts - this medicine may decrease the number of white blood cells, red blood cells and platelets. You may be at increased risk for infections and bleeding. signs of hand-foot syndrome - tingling or burning, redness, flaking, swelling, small blisters, or small sores on the palms of your hands or the soles of your feet signs of infection - fever or chills, cough, sore throat, pain or difficulty passing urine signs of decreased platelets or bleeding - bruising, pinpoint red spots on the skin, black, tarry stools, blood in the urine signs of decreased red blood cells - unusually weak or tired, fainting spells, lightheadedness back pain, chills, facial flushing, fever, headache,  tightness in the chest or throat during the infusion breathing problems chest pain fast, irregular heartbeat mouth pain, redness, sores pain, swelling, redness at site where injected pain, tingling, numbness in the hands or feet swelling of ankles, feet, or hands vomiting Side effects that usually do not require medical attention (report to your doctor or health care professional if they continue or are bothersome): diarrhea hair loss loss of appetite nail discoloration or damage nausea red or watery eyes red colored urine stomach upset This list may not describe all possible side effects. Call your doctor for medical advice about side effects. You may report side effects to FDA at 1-800-FDA-1088. Where should I keep my medication? This drug is given in a hospital or clinic and will not be stored at home. NOTE: This sheet is a summary. It may not cover all possible information. If you have questions about this medicine, talk to  your doctor, pharmacist, or health care provider.  2022 Elsevier/Gold Standard (2018-07-15 00:00:00)

## 2022-01-15 NOTE — Progress Notes (Signed)
[facility] OFFICE PROGRESS NOTE   Diagnosis: Fallopian tube carcinoma  INTERVAL HISTORY:   Anna [name] completed another cycle of Doxil/bevacizumab beginning 12/20/2021.  No nausea/vomiting, mouth sores, or hand/foot pain.  No bleeding or symptom of thrombosis.  No pain.  No difficulty with bowel function.  She continues to have intermittent discomfort at the posterior right neck.  Objective:  Vital signs in last 24 hours:  Blood pressure (!) 153/92, pulse 94, temperature 97.8 F (36.6 C), temperature source Oral, resp. rate 20, height 5' 3"  (1.6 m), weight 173 lb 12.8 oz (78.8 kg), SpO2 99 %.    HEENT: No thrush or ulcers Resp: Lungs clear bilaterally Cardio: Regular rate and rhythm GI: No hepatomegaly, no mass, no apparent ascites Vascular: No leg edema  Skin: Mild erythema of the palm  Portacath/PICC-without erythema  Lab Results:  Lab Results  Component Value Date   WBC 5.6 01/15/2022   HGB 10.7 (L) 01/15/2022   HCT 33.2 (L) 01/15/2022   MCV 104.1 (H) 01/15/2022   PLT 215 01/15/2022   NEUTROABS 1.8 01/15/2022    CMP  Lab Results  Component Value Date   NA 139 12/28/2021   K 4.0 12/28/2021   CL 108 12/28/2021   CO2 19 (L) 12/28/2021   GLUCOSE 104 (H) 12/28/2021   BUN 22 12/28/2021   CREATININE 0.97 12/28/2021   CALCIUM 9.0 12/28/2021   PROT 7.2 12/20/2021   ALBUMIN 3.9 12/20/2021   AST 21 12/20/2021   ALT 17 12/20/2021   ALKPHOS 118 12/20/2021   BILITOT 0.3 12/20/2021   GFRNONAA >60 12/28/2021   GFRAA >60 08/10/2020    Lab Results  Component Value Date   CEA1 <1.00 06/27/2017    Medications: I have reviewed the patient's current medications.   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 omentum, right fallopian tube, and bilateral ovaries,pT3,pNx, optimal debulking with small peritoneal studding of the diaphragm and a remaining thin rind of tumor at the posterior peritoneum in the pelvis Cycle 1 adjuvant Taxol/carboplatin 08/05/2017 Cycle 2 adjuvant Taxol/carboplatin 08/26/2017 Cycle 3 adjuvant Taxol/carboplatin 09/17/2017-Taxol dose reduced secondary to neuropathy and bone pain  Cycle 4 adjuvant Taxol/carboplatin 10/10/2017 Cycle 5 adjuvant Taxol/carboplatin 10/31/2017 Cycle 6 adjuvant Taxol/carboplatin 11/21/2017 CT abdomen/pelvis 08/08/2019- narrowing of rectosigmoid colon with eccentric serosal implant, 10 mm lymph node at the superior rectal vein, left upper quadrant soft tissue implant, 9 mm precaval node, 15 mm left internal iliac node, no ascites Cycle 1 Taxol/carboplatin 08/31/2019 Cycle 2 Taxol/carboplatin/Avastin 09/21/2019 Cycle 3 Taxol/Carboplatin 10/12/2019 (Avastin discontinued) Cycle 4 Taxol/carboplatin November 02, 2019 CT abdomen/pelvis 11/17/2019-decrease in size of previously prominent pericaval, left internal iliac, and superior rectal vein nodes, resolved rectosigmoid wall thickening Cycle 5 Taxol/carboplatin 11/22/2019 Cycle 6 Taxol/carboplatin 12/13/2019 Olaparib 01/05/2020 CT abdomen/pelvis 07/10/2020-enlarging soft tissue at the distal left ureter, the ureter is not dilated, peritoneal implant adjacent to the splenic flexure, enlarged left superior rectal node, lymph nodes along the course of the distal IMV have enlarged, eccentric thickening of the sigmoid colon not seen  Olaraparib discontinued 07/12/2020 Cycle 1 Taxol/carboplatin 07/21/2020 Cycle 2 Taxol/carboplatin 08/10/2020  (carboplatin dose reduced secondary to  thrombocytopenia) Cycle 3 Taxol/carboplatin 09/05/2020 Cycle 4 Taxol/carboplatin 09/26/2020 Cycle 5 Taxol/carboplatin 10/24/2020 CT abdomen/pelvis 11/13/2020-overall stable disease, no new site of metastatic disease, generally stable small abdominal/pelvic lymph nodes, slight enlargement of a peritoneal implant in the left pelvis, slight decrease in size of another pelvic implant Cycle 6 Taxol/carboplatin 11/14/2020 Cycle 7 Taxol/carboplatin 12/05/2020 Cycle 8 Taxol/carboplatin 12/26/2020 Cycle 9 Taxol/carboplatin 01/16/2021 Cycle 10 Taxol/carboplatin 02/06/2021 Cycle 11 Taxol/carboplatin 02/27/2021 Cycle 12 Taxol/carboplatin 03/20/2021 Cycle 13 Taxol/carboplatin 04/10/2021 Cycle 14 Taxol/carboplatin 05/08/2021 Cycle 15 Taxol/carboplatin 05/29/2021 Cycle 16 Taxol/carboplatin 06/19/2021 CT abdomen/pelvis 07/06/2021-increased size of a left pelvic soft tissue mass with obstruction of the distal left ureter and new moderate left hydronephrosis, no change in mild pelvic lymphadenopathy Systemic therapy placed on hold CT abdomen/pelvis 10/15/2021-new bilateral pulmonary metastases, new hepatic metastases, mild increase in size of left adnexal mass, mild increase in size of sigmoid mesenteric and left upper quadrant omental nodules CT head 10/15/2021-large lucency in the left parietal skull concerning for a bone metastasis Cycle 1 Doxil 10/23/2021 Cycle 2 Doxil/bevacizumab 11/16/2021 Cycle 3 Doxil/bevacizumab 12/20/2021 Cycle 4 Doxil/bevacizumab 01/15/2022    Depression   3.   Hypothyroid   4.   Family history of uterine cancer-paternal grandmother   36.   Acute onset dyspnea/pleuritic left-sided chest pain 10/02/2019 CT chest 10/04/2019-left lower lobe pulmonary emboli, multifocal pneumonia lower lobes On Xarelto, status post course of Levaquin.  6.  Acute onset left lower back pain 01/24/2021-resolved after a Medrol Dosepak  7.  Mild elevation of the creatinine, renal  ultrasound 05/31/2021-mild left hydronephrosis CT 07/06/2021-new moderate left hydronephrosis, left ureter stent placed 07/18/2021 8.  Neutropenia following cycle 1 Doxil-Udenyca added with cycle 2       Disposition: Anna [name] has completed 3 cycles of Doxil/bevacizumab.  She has tolerated treatment well.  She will complete cycle 4 beginning today.  She will undergo a restaging CT after this cycle.  The CA125 has normalized.  She will return for an office visit with the plan to complete cycle 5 Doxil/bevacizumab on 02/12/2022.  [name], MD  01/15/2022  11:25 AM

## 2022-01-15 NOTE — Progress Notes (Signed)
Patient presents for treatment. RN assessment completed along with the following:  Labs/vitals reviewed - Yes, and within treatment parameters.   Weight within 10% of previous measurement - Yes Informed consent completed and reflects current therapy/intent - Yes, on date 10/23/21             Provider progress note reviewed - Today's provider note is not yet available. I reviewed the most recent oncology provider progress note in chart dated 12/20/21. Treatment/Antibody/Supportive plan reviewed - Yes, and there are no adjustments needed for today's treatment. S&H and other orders reviewed - Yes, and there are no additional orders identified. Previous treatment date reviewed - Yes, and the appropriate amount of time has elapsed between treatments. Clinic Hand Off Received from - Cristy Friedlander, RN  Patient to proceed with treatment.

## 2022-01-15 NOTE — Progress Notes (Addendum)
Patient seen by Dr. Benay Spice today  Vitals are within treatment parameters.  Labs reviewed by Dr. Benay Spice and are within treatment parameters. Adding on CA 125 for today. Per physician team, proceed w/treatment today with only modification to give [facility] tomorrow afternoon.

## 2022-01-16 ENCOUNTER — Other Ambulatory Visit: Payer: Self-pay | Admitting: Internal Medicine

## 2022-01-16 ENCOUNTER — Inpatient Hospital Stay: Payer: Medicare Other | Attending: Oncology

## 2022-01-16 ENCOUNTER — Encounter: Payer: Self-pay | Admitting: Oncology

## 2022-01-16 VITALS — BP 137/84 | HR 89 | Temp 97.8°F | Resp 18

## 2022-01-16 DIAGNOSIS — C7802 Secondary malignant neoplasm of left lung: Secondary | ICD-10-CM | POA: Diagnosis not present

## 2022-01-16 DIAGNOSIS — N1330 Unspecified hydronephrosis: Secondary | ICD-10-CM | POA: Diagnosis not present

## 2022-01-16 DIAGNOSIS — D709 Neutropenia, unspecified: Secondary | ICD-10-CM | POA: Diagnosis not present

## 2022-01-16 DIAGNOSIS — E039 Hypothyroidism, unspecified: Secondary | ICD-10-CM | POA: Diagnosis not present

## 2022-01-16 DIAGNOSIS — Z5189 Encounter for other specified aftercare: Secondary | ICD-10-CM | POA: Insufficient documentation

## 2022-01-16 DIAGNOSIS — Z5111 Encounter for antineoplastic chemotherapy: Secondary | ICD-10-CM | POA: Diagnosis present

## 2022-01-16 DIAGNOSIS — C7801 Secondary malignant neoplasm of right lung: Secondary | ICD-10-CM | POA: Insufficient documentation

## 2022-01-16 DIAGNOSIS — F32A Depression, unspecified: Secondary | ICD-10-CM | POA: Diagnosis not present

## 2022-01-16 DIAGNOSIS — C787 Secondary malignant neoplasm of liver and intrahepatic bile duct: Secondary | ICD-10-CM | POA: Insufficient documentation

## 2022-01-16 DIAGNOSIS — C5702 Malignant neoplasm of left fallopian tube: Secondary | ICD-10-CM | POA: Diagnosis present

## 2022-01-16 DIAGNOSIS — Z808 Family history of malignant neoplasm of other organs or systems: Secondary | ICD-10-CM | POA: Diagnosis not present

## 2022-01-16 LAB — CA 125: Cancer Antigen (CA) 125: 11.5 U/mL (ref 0.0–38.1)

## 2022-01-16 MED ORDER — PEGFILGRASTIM-CBQV 6 MG/0.6ML ~~LOC~~ SOSY
6.0000 mg | PREFILLED_SYRINGE | Freq: Once | SUBCUTANEOUS | Status: AC
  Administered 2022-01-16: 6 mg via SUBCUTANEOUS
  Filled 2022-01-16: qty 0.6

## 2022-01-16 NOTE — Patient Instructions (Signed)

## 2022-01-17 ENCOUNTER — Encounter: Payer: Self-pay | Admitting: Internal Medicine

## 2022-01-17 ENCOUNTER — Telehealth: Payer: Self-pay | Admitting: *Deleted

## 2022-01-17 ENCOUNTER — Inpatient Hospital Stay: Payer: Medicare Other

## 2022-01-17 NOTE — Telephone Encounter (Signed)
Left VM with CT scan appointment and prep for 02/11/22. Arrive at [facility] at 10:00 for port access with lab. Arrive in radiology at [facility] at 11:15 for 11:30 scan. Nothing to eat/drink for 4 hours prior except for oral contrast. Drink 1st bottle at 0930 and 2nd at 1030. ?Sent message via Mychart as well with request to respond or call to confirm. ?

## 2022-01-18 ENCOUNTER — Other Ambulatory Visit: Payer: Self-pay | Admitting: Oncology

## 2022-01-18 NOTE — Telephone Encounter (Signed)
Pt called in stating that [facility] received the PA for medication (amLODipine (NORVASC) 5 MG tablet) but they didn't receive the PA for Medication (buPROPion (WELLBUTRIN XL) 300 MG 24 hr tablet). Pt requesting callback ?

## 2022-01-25 ENCOUNTER — Other Ambulatory Visit: Payer: Self-pay

## 2022-01-25 MED ORDER — AMLODIPINE BESYLATE 5 MG PO TABS: 5.0000 mg | ORAL_TABLET | Freq: Every day | ORAL | 1 refills | Status: AC

## 2022-01-29 ENCOUNTER — Other Ambulatory Visit: Payer: Self-pay | Admitting: *Deleted

## 2022-01-29 ENCOUNTER — Other Ambulatory Visit: Payer: Self-pay

## 2022-01-29 ENCOUNTER — Encounter: Payer: Self-pay | Admitting: *Deleted

## 2022-01-29 ENCOUNTER — Inpatient Hospital Stay: Payer: Medicare Other

## 2022-01-29 VITALS — BP 142/89 | HR 100 | Temp 98.2°F | Resp 20 | Ht 63.0 in | Wt 170.4 lb

## 2022-01-29 DIAGNOSIS — C5702 Malignant neoplasm of left fallopian tube: Secondary | ICD-10-CM

## 2022-01-29 DIAGNOSIS — R3589 Other polyuria: Secondary | ICD-10-CM

## 2022-01-29 DIAGNOSIS — Z5111 Encounter for antineoplastic chemotherapy: Secondary | ICD-10-CM | POA: Diagnosis not present

## 2022-01-29 LAB — URINALYSIS, COMPLETE (UACMP) WITH MICROSCOPIC
Bilirubin Urine: NEGATIVE
Glucose, UA: NEGATIVE mg/dL
Ketones, ur: NEGATIVE mg/dL
Nitrite: NEGATIVE
Protein, ur: >300 mg/dL — AB
Specific Gravity, Urine: 1.022 (ref 1.005–1.030)
WBC, UA: >50 WBC/hpf — ABNORMAL HIGH (ref 0–5)
pH: 5.5 (ref 5.0–8.0)

## 2022-01-29 MED ORDER — SODIUM CHLORIDE 0.9 % IV SOLN
10.0000 mg/kg | Freq: Once | INTRAVENOUS | Status: AC
  Administered 2022-01-29: 800 mg via INTRAVENOUS
  Filled 2022-01-29: qty 32

## 2022-01-29 MED ORDER — SODIUM CHLORIDE 0.9 % IV SOLN: Freq: Once | INTRAVENOUS | Status: AC

## 2022-01-29 MED ORDER — HEPARIN SOD (PORK) LOCK FLUSH 100 UNIT/ML IV SOLN
500.0000 [IU] | Freq: Once | INTRAVENOUS | Status: AC | PRN
  Administered 2022-01-29: 500 [IU]

## 2022-01-29 MED ORDER — SODIUM CHLORIDE 0.9% FLUSH
10.0000 mL | INTRAVENOUS | Status: DC | PRN
  Administered 2022-01-29: 10 mL

## 2022-01-29 NOTE — Progress Notes (Signed)
U/A and urine culture ordered per Dr Benay Spice for painful urination and frequency ?

## 2022-01-29 NOTE — Progress Notes (Signed)
Anna [name] in today for treatment and described painful and frequency of urination. She has a stent in place and it was replaced on 2/10 by Dr Louis Meckel.   She had a course of Cipro from 2/8-2/13. ? ?Called to [facility] and spoke with [facility].  She requested that I fax over U/A results to [phone] to attention Dr Louis Meckel for his guidance. ? ?Faxed over at 2:30 on 01/29/22.   Will continue to follow as needed. Anna [name] made aware. ? ?Urine culture results pending ? ?

## 2022-01-29 NOTE — Progress Notes (Signed)
Patient presents for treatment. RN assessment completed along with the following: ? ?Labs/vitals reviewed - Yes, and within treatment parameters. As at 01/15/2022  ?Weight within 10% of previous measurement - Yes ?Informed consent completed and reflects current therapy/intent - Yes, on date 10/23/2021             ?Provider progress note reviewed - Patient not seen by provider today. Most recent note dated 01/15/2022 reviewed. ?Treatment/Antibody/Supportive plan reviewed - Yes, and there are no adjustments needed for today's treatment. ?S&H and other orders reviewed - Yes, and there are no additional orders identified. ?Previous treatment date reviewed - Yes, and the appropriate amount of time has elapsed between treatments. ?Clinic Hand Off Received from - No ? ?Patient to proceed with treatment.  ? ?

## 2022-01-29 NOTE — Patient Instructions (Signed)
Anna [name]   ?Discharge Instructions: ?Thank you for choosing [facility] to provide your oncology and hematology care.  ? ?If you have a lab appointment with the [facility], please go directly to the [facility] and check in at the registration area. ?  ?Wear comfortable clothing and clothing appropriate for easy access to any Portacath or PICC line.  ? ?We strive to give you quality time with your provider. You may need to reschedule your appointment if you arrive late (15 or more minutes).  Arriving late affects you and other patients whose appointments are after yours.  Also, if you miss three or more appointments without notifying the office, you may be dismissed from the clinic at the provider?s discretion.    ?  ?For prescription refill requests, have your pharmacy contact our office and allow 72 hours for refills to be completed.   ? ?Today you received the following chemotherapy and/or immunotherapy agents Bevacizumab-awwb (MVASI).    ?  ?To help prevent nausea and vomiting after your treatment, we encourage you to take your nausea medication as directed. ? ?BELOW ARE SYMPTOMS THAT SHOULD BE REPORTED IMMEDIATELY: ?*FEVER GREATER THAN 100.4 F (38 ?C) OR HIGHER ?*CHILLS OR SWEATING ?*NAUSEA AND VOMITING THAT IS NOT CONTROLLED WITH YOUR NAUSEA MEDICATION ?*UNUSUAL SHORTNESS OF BREATH ?*UNUSUAL BRUISING OR BLEEDING ?*URINARY PROBLEMS (pain or burning when urinating, or frequent urination) ?*BOWEL PROBLEMS (unusual diarrhea, constipation, pain near the anus) ?TENDERNESS IN MOUTH AND THROAT WITH OR WITHOUT PRESENCE OF ULCERS (sore throat, sores in mouth, or a toothache) ?UNUSUAL RASH, SWELLING OR PAIN  ?UNUSUAL VAGINAL DISCHARGE OR ITCHING  ? ?Items with * indicate a potential emergency and should be followed up as soon as possible or go to the Emergency Department if any problems should occur. ? ?Please show the CHEMOTHERAPY ALERT CARD or IMMUNOTHERAPY ALERT CARD  at check-in to the Emergency Department and triage nurse. ? ?Should you have questions after your visit or need to cancel or reschedule your appointment, please contact [facility]  Dept: [phone]  and follow the prompts.  Office hours are 8:00 a.m. to 4:30 p.m. Monday - Friday. Please note that voicemails left after 4:00 p.m. may not be returned until the following business day.  We are closed weekends and major holidays. You have access to a nurse at all times for urgent questions. Please call the main number to the clinic Dept: [phone] and follow the prompts. ? ? ?For any non-urgent questions, you may also contact your provider using MyChart. We now offer e-Visits for anyone 62 and older to request care online for non-urgent symptoms. For details visit [email or web address]. ?  ?Also download the MyChart app! Go to the app store, search "MyChart", open the app, select [facility], and log in with your MyChart username and password. ? ?Due to Covid, a mask is required upon entering the hospital/clinic. If you do not have a mask, one will be given to you upon arrival. For doctor visits, patients may have 1 support person aged 52 or older with them. For treatment visits, patients cannot have anyone with them due to current Covid guidelines and our immunocompromised population.  ? ?Bevacizumab injection ?What is this medication? ?BEVACIZUMAB (be va SIZ yoo mab) is a monoclonal antibody. It is used to treat many types of cancer. ?This medicine may be used for other purposes; ask your health care provider or pharmacist if you have questions. ?COMMON BRAND NAME(S):  Alymsys, Avastin, MVASI, Zirabev ?What should I tell my care team before I take this medication? ?They need to know if you have any of these conditions: ?diabetes ?heart disease ?high blood pressure ?history of coughing up blood ?prior anthracycline chemotherapy (e.g., doxorubicin, daunorubicin, epirubicin) ?recent or  ongoing radiation therapy ?recent or planning to have surgery ?stroke ?an unusual or allergic reaction to bevacizumab, hamster proteins, mouse proteins, other medicines, foods, dyes, or preservatives ?pregnant or trying to get pregnant ?breast-feeding ?How should I use this medication? ?This medicine is for infusion into a vein. It is given by a health care professional in a hospital or clinic setting. ?Talk to your pediatrician regarding the use of this medicine in children. Special care may be needed. ?Overdosage: If you think you have taken too much of this medicine contact a poison control center or emergency room at once. ?NOTE: This medicine is only for you. Do not share this medicine with others. ?What if I miss a dose? ?It is important not to miss your dose. Call your doctor or health care professional if you are unable to keep an appointment. ?What may interact with this medication? ?Interactions are not expected. ?This list may not describe all possible interactions. Give your health care provider a list of all the medicines, herbs, non-prescription drugs, or dietary supplements you use. Also tell them if you smoke, drink alcohol, or use illegal drugs. Some items may interact with your medicine. ?What should I watch for while using this medication? ?Your condition will be monitored carefully while you are receiving this medicine. You will need important blood work and urine testing done while you are taking this medicine. ?This medicine may increase your risk to bruise or bleed. Call your doctor or health care professional if you notice any unusual bleeding. ?Before having surgery, talk to your health care provider to make sure it is ok. This drug can increase the risk of poor healing of your surgical site or wound. You will need to stop this drug for 28 days before surgery. After surgery, wait at least 28 days before restarting this drug. Make sure the surgical site or wound is healed enough before  restarting this drug. Talk to your health care provider if questions. ?Do not become pregnant while taking this medicine or for 6 months after stopping it. Women should inform their doctor if they wish to become pregnant or think they might be pregnant. There is a potential for serious side effects to an unborn child. Talk to your health care professional or pharmacist for more information. Do not breast-feed an infant while taking this medicine and for 6 months after the last dose. ?This medicine has caused ovarian failure in some women. This medicine may interfere with the ability to have a child. You should talk to your doctor or health care professional if you are concerned about your fertility. ?What side effects may I notice from receiving this medication? ?Side effects that you should report to your doctor or health care professional as soon as possible: ?allergic reactions like skin rash, itching or hives, swelling of the face, lips, or tongue ?chest pain or chest tightness ?chills ?coughing up blood ?high fever ?seizures ?severe constipation ?signs and symptoms of bleeding such as bloody or black, tarry stools; red or dark-brown urine; spitting up blood or brown material that looks like coffee grounds; red spots on the skin; unusual bruising or bleeding from the eye, gums, or nose ?signs and symptoms of a blood clot such as  breathing problems; chest pain; severe, sudden headache; pain, swelling, warmth in the leg ?signs and symptoms of a stroke like changes in vision; confusion; trouble speaking or understanding; severe headaches; sudden numbness or weakness of the face, arm or leg; trouble walking; dizziness; loss of balance or coordination ?stomach pain ?sweating ?swelling of legs or ankles ?vomiting ?weight gain ?Side effects that usually do not require medical attention (report to your doctor or health care professional if they continue or are bothersome): ?back pain ?changes in taste ?decreased  appetite ?dry skin ?nausea ?tiredness ?This list may not describe all possible side effects. Call your doctor for medical advice about side effects. You may report side effects to FDA at 1-800-FDA-1088. ?Where should

## 2022-01-31 ENCOUNTER — Encounter: Payer: Self-pay | Admitting: *Deleted

## 2022-01-31 LAB — URINE CULTURE: Culture: <10000 — AB

## 2022-01-31 NOTE — Progress Notes (Signed)
Patient received call from [facility] that they were prescribing Macrobid on 3/15 to [facility] and patient is going to receive today and will begin therapy.  ? ?Culture results still pending, will continue to monitor ?

## 2022-02-01 ENCOUNTER — Encounter: Payer: Self-pay | Admitting: Oncology

## 2022-02-10 ENCOUNTER — Other Ambulatory Visit: Payer: Self-pay | Admitting: Oncology

## 2022-02-11 ENCOUNTER — Encounter (HOSPITAL_BASED_OUTPATIENT_CLINIC_OR_DEPARTMENT_OTHER): Payer: Self-pay

## 2022-02-11 ENCOUNTER — Ambulatory Visit (HOSPITAL_BASED_OUTPATIENT_CLINIC_OR_DEPARTMENT_OTHER)
Admission: RE | Admit: 2022-02-11 | Discharge: 2022-02-11 | Disposition: A | Discharge status: Home / Self Care | Payer: Medicare Other | Source: Ambulatory Visit | Attending: Oncology | Admitting: Oncology

## 2022-02-11 ENCOUNTER — Inpatient Hospital Stay: Payer: Medicare Other

## 2022-02-11 ENCOUNTER — Ambulatory Visit (HOSPITAL_COMMUNITY): Payer: BLUE CROSS/BLUE SHIELD

## 2022-02-11 ENCOUNTER — Other Ambulatory Visit: Payer: Self-pay

## 2022-02-11 DIAGNOSIS — C5702 Malignant neoplasm of left fallopian tube: Secondary | ICD-10-CM | POA: Diagnosis not present

## 2022-02-11 LAB — CMP (CANCER CENTER ONLY)
ALT: 25 U/L (ref 0–44)
AST: 32 U/L (ref 15–41)
Albumin: 4.1 g/dL (ref 3.5–5.0)
Alkaline Phosphatase: 135 U/L — ABNORMAL HIGH (ref 38–126)
Anion gap: 11 (ref 5–15)
BUN: 22 mg/dL (ref 8–23)
CO2: 24 mmol/L (ref 22–32)
Calcium: 9.5 mg/dL (ref 8.9–10.3)
Chloride: 103 mmol/L (ref 98–111)
Creatinine: 1.55 mg/dL — ABNORMAL HIGH (ref 0.44–1.00)
GFR, Estimated: 37 mL/min — ABNORMAL LOW (ref >60)
Glucose, Bld: 94 mg/dL (ref 70–99)
Potassium: 4.4 mmol/L (ref 3.5–5.1)
Sodium: 138 mmol/L (ref 135–145)
Total Bilirubin: 0.4 mg/dL (ref 0.3–1.2)
Total Protein: 7.6 g/dL (ref 6.5–8.1)

## 2022-02-11 LAB — CBC WITH DIFFERENTIAL (CANCER CENTER ONLY)
Abs Immature Granulocytes: 0.03 10*3/uL (ref 0.00–0.07)
Basophils Absolute: 0 10*3/uL (ref 0.0–0.1)
Basophils Relative: 1 %
Eosinophils Absolute: 0.1 10*3/uL (ref 0.0–0.5)
Eosinophils Relative: 1 %
HCT: 35.7 % — ABNORMAL LOW (ref 36.0–46.0)
Hemoglobin: 11.4 g/dL — ABNORMAL LOW (ref 12.0–15.0)
Immature Granulocytes: 0 %
Lymphocytes Relative: 45 %
Lymphs Abs: 3.2 10*3/uL (ref 0.7–4.0)
MCH: 33.4 pg (ref 26.0–34.0)
MCHC: 31.9 g/dL (ref 30.0–36.0)
MCV: 104.7 fL — ABNORMAL HIGH (ref 80.0–100.0)
Monocytes Absolute: 0.8 10*3/uL (ref 0.1–1.0)
Monocytes Relative: 11 %
Neutro Abs: 3 10*3/uL (ref 1.7–7.7)
Neutrophils Relative %: 42 %
Platelet Count: 210 10*3/uL (ref 150–400)
RBC: 3.41 MIL/uL — ABNORMAL LOW (ref 3.87–5.11)
RDW: 16.2 % — ABNORMAL HIGH (ref 11.5–15.5)
WBC Count: 7.1 10*3/uL (ref 4.0–10.5)
nRBC: 0 % (ref 0.0–0.2)

## 2022-02-11 MED ORDER — HEPARIN SOD (PORK) LOCK FLUSH 100 UNIT/ML IV SOLN
500.0000 [IU] | Freq: Once | INTRAVENOUS | Status: AC
  Administered 2022-02-11: 500 [IU] via INTRAVENOUS

## 2022-02-11 MED ORDER — IOHEXOL 350 MG/ML SOLN
65.0000 mL | Freq: Once | INTRAVENOUS | Status: AC | PRN
  Administered 2022-02-11: 65 mL via INTRAVENOUS

## 2022-02-12 ENCOUNTER — Encounter: Payer: Self-pay | Admitting: Oncology

## 2022-02-12 ENCOUNTER — Inpatient Hospital Stay: Payer: Medicare Other

## 2022-02-12 ENCOUNTER — Ambulatory Visit: Payer: BLUE CROSS/BLUE SHIELD

## 2022-02-12 ENCOUNTER — Inpatient Hospital Stay (HOSPITAL_BASED_OUTPATIENT_CLINIC_OR_DEPARTMENT_OTHER): Payer: Medicare Other | Admitting: Oncology

## 2022-02-12 ENCOUNTER — Ambulatory Visit: Payer: BLUE CROSS/BLUE SHIELD | Admitting: Oncology

## 2022-02-12 VITALS — BP 143/86 | HR 86 | Temp 97.8°F | Resp 18 | Ht 63.0 in | Wt 171.6 lb

## 2022-02-12 DIAGNOSIS — C5702 Malignant neoplasm of left fallopian tube: Secondary | ICD-10-CM

## 2022-02-12 DIAGNOSIS — Z5111 Encounter for antineoplastic chemotherapy: Secondary | ICD-10-CM | POA: Diagnosis not present

## 2022-02-12 DIAGNOSIS — Z95828 Presence of other vascular implants and grafts: Secondary | ICD-10-CM

## 2022-02-12 LAB — CA 125: Cancer Antigen (CA) 125: 12.6 U/mL (ref 0.0–38.1)

## 2022-02-12 MED ORDER — HEPARIN SOD (PORK) LOCK FLUSH 100 UNIT/ML IV SOLN
500.0000 [IU] | Freq: Once | INTRAVENOUS | Status: AC | PRN
  Administered 2022-02-12: 500 [IU]

## 2022-02-12 MED ORDER — SODIUM CHLORIDE 0.9% FLUSH
10.0000 mL | INTRAVENOUS | Status: DC | PRN
  Administered 2022-02-12: 10 mL

## 2022-02-12 NOTE — Progress Notes (Signed)
?[facility] ?OFFICE PROGRESS NOTE ? ? ?Diagnosis: Fallopian tube carcinoma ? ?INTERVAL HISTORY:  ? ?[name] returns as scheduled.  She completed another cycle of Doxil/bevacizumab beginning 01/15/2022.  No nausea/vomiting.  No abdominal pain.  No bleeding or symptom of thrombosis.  No difficulty with bowel function.  She completed a course of Macrobid for dysuria.  A urine culture 01/29/2022 had insignificant growth. ? ? ?Objective: ? ?Vital signs in last 24 hours: ? ?Blood pressure (!) 143/86, pulse 86, temperature 97.8 ?F (36.6 ?C), temperature source Oral, resp. rate 18, height 5' 3"  (1.6 m), weight 171 lb 9.6 oz (77.8 kg), SpO2 99 %. ?  ? ?HEENT: No thrush or ulcers ?Resp: Lungs clear bilaterally ?Cardio: Regular rate and rhythm ?GI: No hepatosplenomegaly, no apparent ascites, nontender, no mass ?Vascular: No leg edema  ?Skin: Mild erythema of the palms ? ?Portacath/PICC-without erythema ? ?Lab Results: ? ?Lab Results  ?Component Value Date  ? WBC 7.1 02/11/2022  ? HGB 11.4 (L) 02/11/2022  ? HCT 35.7 (L) 02/11/2022  ? MCV 104.7 (H) 02/11/2022  ? PLT 210 02/11/2022  ? NEUTROABS 3.0 02/11/2022  ? ? ?CMP  ?Lab Results  ?Component Value Date  ? NA 138 02/11/2022  ? K 4.4 02/11/2022  ? CL 103 02/11/2022  ? CO2 24 02/11/2022  ? GLUCOSE 94 02/11/2022  ? BUN 22 02/11/2022  ? CREATININE 1.55 (H) 02/11/2022  ? CALCIUM 9.5 02/11/2022  ? PROT 7.6 02/11/2022  ? ALBUMIN 4.1 02/11/2022  ? AST 32 02/11/2022  ? ALT 25 02/11/2022  ? ALKPHOS 135 (H) 02/11/2022  ? BILITOT 0.4 02/11/2022  ? GFRNONAA 37 (L) 02/11/2022  ? GFRAA >60 08/10/2020  ? ? ?Lab Results  ?Component Value Date  ? CEA1 <1.00 06/27/2017  ? ?Imaging: ? ?CT ABDOMEN PELVIS W CONTRAST ? ?Result Date: 02/11/2022 ?CLINICAL DATA:  Restaging fallopian tube carcinoma. * Tracking Code: BO * EXAM: CT ABDOMEN AND PELVIS WITH CONTRAST TECHNIQUE: Multidetector CT imaging of the abdomen and pelvis was performed using the standard protocol following bolus  administration of intravenous contrast. RADIATION DOSE REDUCTION: This exam was performed according to the departmental dose-optimization program which includes automated exposure control, adjustment of the mA and/or kV according to patient size and/or use of iterative reconstruction technique. CONTRAST:  59m OMNIPAQUE IOHEXOL 350 MG/ML SOLN COMPARISON:  10/15/2021 FINDINGS: Lower chest: Nodule in the right middle lobe is only partially visualized measuring at least 8 mm, image 1/7. Previously this measured the same posterior right lower lobe nodule measures 7 mm, image 6/7. Also unchanged. 1 cm right middle lobe lung nodule is noted, image 8/7. Previously 0.9 cm. There is a new nodule within the posteromedial right lower lobe measuring 5 mm, image 10/7. Hepatobiliary: Multifocal liver metastases are again noted, including: Index lesion adjacent to the gallbladder fossa measures 2.3 x 2.5 cm, image 27/2. Formally this measured 2.0 x 2.5 cm. Index lesion within segment 7 measures 3.5 x 1.9 cm, image 20/2. Previously 2.5 x 1.2 cm. Central right lobe of liver lesion measures 2.0 x 1.7 cm, image 19/2. Previously 1.5 x 1.5 cm. Gallbladder appears normal.  No bile duct dilatation. Pancreas: Unremarkable. No pancreatic ductal dilatation or surrounding inflammatory changes. Spleen: Normal in size without focal abnormality. Adrenals/Urinary Tract: Normal appearance of the adrenal glands. Simple cyst within upper pole of right kidney measures 2.6 cm, image 23/2. No follow-up is recommended. Left-sided nephroureteral stent is in place. Recurrent left-sided hydronephrosis is noted on today's study. Stomach/Bowel: Stomach appears  nondistended. No bowel wall thickening, inflammation, or distension. Vascular/Lymphatic: Aortic atherosclerosis. No aneurysm. No retroperitoneal or mesenteric adenopathy identified. Reproductive: Asymmetric increased soft tissue within the left adnexa is again noted along the course of the distal left  ureter. This measures 2.9 x 1.9 cm, image 63/2. Previously 3.6 x 2.9 cm. Adjacent soft tissue nodule measures 0.9 cm, image 62/2. Unchanged from previous exam. Status post hysterectomy. Other: No ascites or focal fluid collections. Presacral soft tissue nodule measures 0.7 cm, image 60/2. Previously 1 cm. Peritoneal nodule along the lateral aspect of the left hemiabdomen measures 0.6 cm, image 30/2. Previously 0.9 cm. Musculoskeletal: No acute or significant osseous findings. IMPRESSION: 1. Increase in size and multiplicity of liver metastases. 2. New nodule is identified within the posteromedial right lower lobe compatible with metastatic disease. The remaining lung nodules which were present on the previous exam are relatively stable in the interval. 3. Slight decrease in size of left adnexal soft tissue mass with adjacent soft tissue nodules. 4. Slight decrease in size of small peritoneal nodule along the lateral aspect of the left hemiabdomen. 5. Left-sided nephroureteral stent is in place with recurrent left-sided hydronephrosis. 6. Aortic Atherosclerosis (ICD10-I70.0). Electronically Signed   By: Kerby Moors M.D.   On: 02/11/2022 15:52   ? ?Medications: I have reviewed the patient's current medications. ? ? 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 omentum, right fallopian tube, and bilateral ovaries,pT3,pNx, optimal debulking with small peritoneal studding of the diaphragm and a remaining  thin rind of tumor at the posterior peritoneum in the pelvis ?Cycle 1 adjuvant Taxol/carboplatin 08/05/2017 ?Cycle 2 adjuvant Taxol/carboplatin 08/26/2017 ?Cycle 3 adjuvant Taxol/carboplatin 09/17/2017-Taxol dose reduced secondary to neuropathy and bone pain  ?Cycle 4 adjuvant Taxol/carboplatin 10/10/2017 ?Cycle 5 adjuvant Taxol/carboplatin 10/31/2017 ?Cycle 6 adjuvant Taxol/carboplatin 11/21/2017 ?CT abdomen/pelvis 08/08/2019- narrowing of rectosigmoid colon with eccentric serosal implant, 10 mm lymph node at the superior rectal vein, left upper quadrant soft tissue implant, 9 mm precaval node, 15 mm left internal iliac node, no ascites ?Cycle 1 Taxol/carboplatin 08/31/2019 ?Cycle 2 Taxol/carboplatin/Avastin 09/21/2019 ?Cycle 3 Taxol/Carboplatin 10/12/2019 (Avastin discontinued) ?Cycle 4 Taxol/carboplatin November 02, 2019 ?CT abdomen/pelvis 11/17/2019-decrease in size of previously prominent pericaval, left internal iliac, and superior rectal vein nodes, resolved rectosigmoid wall thickening ?Cycle 5 Taxol/carboplatin 11/22/2019 ?Cycle 6 Taxol/carboplatin 12/13/2019 ?Olaparib 01/05/2020 ?CT abdomen/pelvis 07/10/2020-enlarging soft tissue at the distal left ureter, the ureter is not dilated, peritoneal implant adjacent to the splenic flexure, enlarged left superior rectal node, lymph nodes along the course of the distal IMV have enlarged, eccentric thickening of the sigmoid colon not seen  ?Olaraparib discontinued 07/12/2020 ?Cycle 1 Taxol/carboplatin 07/21/2020 ?Cycle 2 Taxol/carboplatin 08/10/2020 (carboplatin dose reduced secondary to thrombocytopenia) ?Cycle 3 Taxol/carboplatin 09/05/2020 ?Cycle 4 Taxol/carboplatin 09/26/2020 ?Cycle 5 Taxol/carboplatin 10/24/2020 ?CT abdomen/pelvis 11/13/2020-overall stable disease, no new site of metastatic disease, generally stable small abdominal/pelvic lymph nodes, slight enlargement of a peritoneal implant in the left pelvis, slight decrease in size of another pelvic implant ?Cycle 6  Taxol/carboplatin 11/14/2020 ?Cycle 7 Taxol/carboplatin 12/05/2020 ?Cycle 8 Taxol/carboplatin 12/26/2020 ?Cycle 9 Taxol/carboplatin 01/16/2021 ?Cycle 10 Taxol/carboplatin 02/06/2021 ?Cycle 11 Taxol/carboplatin 4/12/

## 2022-02-12 NOTE — Patient Instructions (Signed)

## 2022-02-21 ENCOUNTER — Inpatient Hospital Stay: Payer: Medicare Other | Attending: Oncology | Admitting: Oncology

## 2022-02-21 VITALS — BP 137/90 | HR 100 | Temp 97.8°F | Resp 20 | Ht 63.0 in | Wt 171.0 lb

## 2022-02-21 DIAGNOSIS — F329 Major depressive disorder, single episode, unspecified: Secondary | ICD-10-CM | POA: Diagnosis not present

## 2022-02-21 DIAGNOSIS — N1330 Unspecified hydronephrosis: Secondary | ICD-10-CM | POA: Insufficient documentation

## 2022-02-21 DIAGNOSIS — Z5111 Encounter for antineoplastic chemotherapy: Secondary | ICD-10-CM | POA: Diagnosis not present

## 2022-02-21 DIAGNOSIS — Z7901 Long term (current) use of anticoagulants: Secondary | ICD-10-CM | POA: Diagnosis not present

## 2022-02-21 DIAGNOSIS — R110 Nausea: Secondary | ICD-10-CM | POA: Insufficient documentation

## 2022-02-21 DIAGNOSIS — C5702 Malignant neoplasm of left fallopian tube: Secondary | ICD-10-CM | POA: Diagnosis present

## 2022-02-21 DIAGNOSIS — D709 Neutropenia, unspecified: Secondary | ICD-10-CM | POA: Insufficient documentation

## 2022-02-21 DIAGNOSIS — E039 Hypothyroidism, unspecified: Secondary | ICD-10-CM | POA: Insufficient documentation

## 2022-02-21 NOTE — Progress Notes (Addendum)
?Anna [name] ?OFFICE PROGRESS NOTE ? ? ?Diagnosis: Fallopian tube carcinoma ? ?INTERVAL HISTORY:  ? ?Ms. Denault returns as scheduled.  She has intermittent mild nausea.  No other complaint.  No difficulty with bowel function.  She saw Dr. Louis Meckel and is being scheduled for placement of a metal left ureter stent. ? ?Objective: ? ?Vital signs in last 24 hours: ? ?Blood pressure 137/90, pulse 100, temperature 97.8 ?F (36.6 ?C), temperature source Oral, resp. rate 20, height _0  (1.6 m), weight 171 lb (77.6 kg), SpO2 98 %. ?  ?Physical exam-not performed today ? ?Lab Results: ? ?Lab Results  ?Component Value Date  ? WBC 7.1 02/11/2022  ? HGB 11.4 (L) 02/11/2022  ? HCT 35.7 (L) 02/11/2022  ? MCV 104.7 (H) 02/11/2022  ? PLT 210 02/11/2022  ? NEUTROABS 3.0 02/11/2022  ? ? ?CMP  ?Lab Results  ?Component Value Date  ? NA 138 02/11/2022  ? K 4.4 02/11/2022  ? CL 103 02/11/2022  ? CO2 24 02/11/2022  ? GLUCOSE 94 02/11/2022  ? BUN 22 02/11/2022  ? CREATININE 1.55 (H) 02/11/2022  ? CALCIUM 9.5 02/11/2022  ? PROT 7.6 02/11/2022  ? ALBUMIN 4.1 02/11/2022  ? AST 32 02/11/2022  ? ALT 25 02/11/2022  ? ALKPHOS 135 (H) 02/11/2022  ? BILITOT 0.4 02/11/2022  ? GFRNONAA 37 (L) 02/11/2022  ? GFRAA >60 08/10/2020  ? ? ?Lab Results  ?Component Value Date  ? CEA1 <1.00 06/27/2017  ? ? ? ?Medications: I have reviewed the patient's current medications. ? ? 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 omentum,  right fallopian tube, and bilateral ovaries,pT3,pNx, optimal debulking with small peritoneal studding of the diaphragm and a remaining thin rind of tumor at the posterior peritoneum in the pelvis ?Cycle 1 adjuvant Taxol/carboplatin 08/05/2017 ?Cycle 2 adjuvant Taxol/carboplatin 08/26/2017 ?Cycle 3 adjuvant Taxol/carboplatin 09/17/2017-Taxol dose reduced secondary to neuropathy and bone pain  ?Cycle 4 adjuvant Taxol/carboplatin 10/10/2017 ?Cycle 5 adjuvant Taxol/carboplatin 10/31/2017 ?Cycle 6 adjuvant Taxol/carboplatin 11/21/2017 ?CT abdomen/pelvis 08/08/2019- narrowing of rectosigmoid colon with eccentric serosal implant, 10 mm lymph node at the superior rectal vein, left upper quadrant soft tissue implant, 9 mm precaval node, 15 mm left internal iliac node, no ascites ?Cycle 1 Taxol/carboplatin 08/31/2019 ?Cycle 2 Taxol/carboplatin/Avastin 09/21/2019 ?Cycle 3 Taxol/Carboplatin 10/12/2019 (Avastin discontinued) ?Cycle 4 Taxol/carboplatin November 02, 2019 ?CT abdomen/pelvis 11/17/2019-decrease in size of previously prominent pericaval, left internal iliac, and superior rectal vein nodes, resolved rectosigmoid wall thickening ?Cycle 5 Taxol/carboplatin 11/22/2019 ?Cycle 6 Taxol/carboplatin 12/13/2019 ?Olaparib 01/05/2020 ?CT abdomen/pelvis 07/10/2020-enlarging soft tissue at the distal left ureter, the ureter is not dilated, peritoneal implant adjacent to the splenic flexure, enlarged left superior rectal node, lymph nodes along the course of the distal IMV have enlarged, eccentric thickening of the sigmoid colon not seen  ?Olaraparib discontinued 07/12/2020 ?Cycle 1 Taxol/carboplatin 07/21/2020 ?Cycle 2 Taxol/carboplatin 08/10/2020 (carboplatin dose reduced secondary to thrombocytopenia) ?Cycle 3 Taxol/carboplatin 09/05/2020 ?Cycle 4 Taxol/carboplatin 09/26/2020 ?Cycle 5 Taxol/carboplatin 10/24/2020 ?CT abdomen/pelvis 11/13/2020-overall stable disease, no new site of metastatic disease, generally stable small abdominal/pelvic  lymph nodes, slight enlargement of a peritoneal implant in the left pelvis, slight  decrease in size of another pelvic implant ?Cycle 6 Taxol/carboplatin 11/14/2020 ?Cycle 7 Taxol/carboplatin 12/05/2020 ?Cycle 8 Taxol/carboplatin 12/26/2020 ?Cycle 9 Taxol/carboplatin 01/16/2021 ?Cycle 10 Taxol/carboplatin 02/06/2021 ?Cycle 11 Taxol/carboplatin 02/27/2021 ?Cycle 12 Taxol/carboplatin 03/20/2021 ?Cycle 13 Taxol/carboplatin 04/10/2021 ?Cycle 14 Taxol/carboplatin 05/08/2021 ?Cycle 15 Taxol/carboplatin 05/29/2021 ?Cycle 16 Taxol/carboplatin 06/19/2021 ?CT abdomen/pelvis 07/06/2021-increased size of a left pelvic soft tissue mass with obstruction of the distal left ureter and new moderate left hydronephrosis, no change in mild pelvic lymphadenopathy ?Systemic therapy placed on hold ?CT abdomen/pelvis 10/15/2021-new bilateral pulmonary metastases, new hepatic metastases, mild increase in size of left adnexal mass, mild increase in size of sigmoid mesenteric and left upper quadrant omental nodules ?CT head 10/15/2021-large lucency in the left parietal skull concerning for a bone metastasis ?Cycle 1 Doxil 10/23/2021 ?Cycle 2 Doxil/bevacizumab 11/16/2021 ?Cycle 3 Doxil/bevacizumab 12/20/2021 ?Cycle 4 Doxil/bevacizumab 01/15/2022 ?CT abdomen/pelvis 02/11/2022-increased size and number of liver lesions, new right lung nodule, other lung nodules stable, decrease in left adnexal soft tissue mass and peritoneal nodules, progressive left hydronephrosis ?Guardant360 02/12/2022-BRCA1-single copy deletion, T p53, EGFR amplification, tumor mutation burden 17, MSI high-not detected ? ?  ?Depression ?  ?3.   Hypothyroid ?  ?4.   Family history of uterine cancer-paternal grandmother ?  ?5.   Acute onset dyspnea/pleuritic left-sided chest pain 10/02/2019 ?CT chest 10/04/2019-left lower lobe pulmonary emboli, multifocal pneumonia lower lobes ?On Xarelto, status post course of Levaquin. ? ?6.  Acute onset left lower back pain 01/24/2021-resolved after a Medrol  Dosepak ? ?7.  Mild elevation of the creatinine, renal ultrasound 05/31/2021-mild left hydronephrosis ?CT 07/06/2021-new moderate left hydronephrosis, left ureter stent placed 07/18/2021 ?8.  Neutropenia following cycle 1 Doxil-Udenyca added with cycle 2 ? ? ? ? ? ?Disposition: ?Ms. Nabozny has metastatic fallopian tube carcinoma.  There was evidence of disease progression on a CT 02/11/2022.  Doxil/bevacizumab was placed on hold.  She had an elevated creatinine on 02/11/2022.  There was left hydronephrosis on the CT.  She saw Dr. Louis Meckel and is being scheduled for placement of a left ureter stent. ?I discussed treatment options with Ms. Calabria and her husband.  The peripheral blood next generation sequencing does not reveal an actionable mutation.  The tumor mutation burden returned elevated.  She will be a candidate for pembrolizumab therapy. ? ?We discussed standard salvage treatment options including gemcitabine and oral etoposide.  We also discussed referral to the GYN oncology service at [facility] to consider a clinical trial.  I will contact [facility] to see whether she is eligible for a clinical trial there.  We will discussed proceeding with gemcitabine versus pembrolizumab.  We discussed potential toxicities associated with gemcitabine including the chance of hematologic toxicity, rash, fever, and pneumonitis.  She agrees to proceed. ? ?I will discuss the alternate option of pembrolizumab with my colleagues and the [facility] oncology team. ? ? ?She will be scheduled for an office visit and gemcitabine on 03/11/2022. ? ?A chemotherapy plan was entered today. ? ?[name], MD ? ?02/21/2022  ?3:57 PM ? ? ?

## 2022-02-21 NOTE — Progress Notes (Signed)
DISCONTINUE ON PATHWAY REGIMEN - Ovarian ? ? ?  A cycle is every 28 days: ?    Liposomal doxorubicin  ?    Bevacizumab-xxxx  ? ?**Always confirm dose/schedule in your pharmacy ordering system** ? ?REASON: Disease Progression ?PRIOR TREATMENT: OVOS81: Liposomal Doxorubicin (Doxil) 40 mg/m2 D1 + Bevacizumab 10 mg/kg D1, 15 q28 Days; Stop Treatment After 6 Cycles If Complete Response, Otherwise Continue Treatment Until Progression or Unacceptable Toxicity ?TREATMENT RESPONSE: Progressive Disease (PD) ? ?START ON PATHWAY REGIMEN - Ovarian ? ? ?  A cycle is every 21 days: ?    Gemcitabine  ? ?**Always confirm dose/schedule in your pharmacy ordering system** ? ?Patient Characteristics: ?Recurrent or Progressive Disease, Third Line, Platinum Resistant or < 6 Months Since Last Platinum Therapy ?BRCA Mutation Status: Absent ?Therapeutic Status: Recurrent or Progressive Disease ?Line of Therapy: Third Line ? ?Intent of Therapy: ?Non-Curative / Palliative Intent, Discussed with Patient ?

## 2022-02-22 ENCOUNTER — Telehealth: Payer: Self-pay | Admitting: Oncology

## 2022-02-22 NOTE — Telephone Encounter (Signed)
Called 1X, no asnwer left voicemail for patient to call back to schedule follow up per LOS 4/6. ?

## 2022-02-25 ENCOUNTER — Other Ambulatory Visit: Payer: Self-pay | Admitting: Urology

## 2022-02-25 ENCOUNTER — Encounter: Payer: Self-pay | Admitting: Internal Medicine

## 2022-02-25 DIAGNOSIS — N1330 Unspecified hydronephrosis: Secondary | ICD-10-CM | POA: Insufficient documentation

## 2022-02-26 ENCOUNTER — Telehealth: Payer: Self-pay | Admitting: *Deleted

## 2022-02-26 NOTE — Telephone Encounter (Signed)
Inquiry from [facility] regarding management of Xarelto for left ureteral stent placement on 4/20 per Dr. Louis Meckel. ?Left VM for nurse, Pam that Dr. Benay Spice said OK to hold Xarelto for 2 days prior to procedure and resume pm day of procedure unless Dr. Louis Meckel needs it held an additional day, which is OK. ?

## 2022-03-04 NOTE — Progress Notes (Addendum)
Anesthesia Review: ? ?PCP: DR Einar Pheasant  Last visit was Tleemedicine visit on 03/22/21  ?Cardiologist : none  ?Chest x-ray : ?EKG : 07/18/21  ?Echo :12/0/22  ?Stress test: ?Cardiac Cath :  ?Activity level:  can do a flgiht of stairs without difficulty  ?Sleep Study/ CPAP : no longer uses cpap  ?Fasting Blood Sugar :      / Checks Blood Sugar -- times a day:   ?Blood Thinner/ Instructions /Last Dose: ?ASA / Instructions/ Last Dose :   ?Xarelto - last dose on 03/04/22 per pt  ?Xarelto instructions under 02/26/2022 Telephone encounter  ?02/11/22- cbc and CMP .  Creatinine elevated.   ?Repeated on 03/06/2022.   ?PT has PORT  ?PT stated at time preop appt was made she asked for port to be accessed for labs on preop appt. Comment was placed under appt notes but preop nurse not aware.  Informed pt that order was needed to access port and Short Stay to be notified ahead of time because in Preop we do not access ports.  Informed pt that niurse would relay info so nurse can request for orders ahead of time to obtain order for port to be accessed and to notify Short Stay nurses ahead of time so they can place on their schedule on preop day.  PST voiced understanding.   ?

## 2022-03-04 NOTE — Progress Notes (Signed)
DUE TO COVID-19 ONLY 2  VISITOR IS ALLOWED TO COME WITH YOU AND STAY IN THE WAITING ROOM ONLY DURING PRE OP AND PROCEDURE DAY OF SURGERY. 4  VISITOR  MAY VISIT WITH YOU AFTER SURGERY IN YOUR PRIVATE ROOM DURING VISITING HOURS ONLY! ?YOU MAY HAVE ONE PERSON SPEND THE NITE WITH YOU IN YOUR ROOM AFTER SURGERY.   ? ? Your procedure is scheduled on:  ? 03/07/2022  ? Report to [facility] Main  Entrance ? ? Report to admitting at     42             AM ?DO NOT BRING INSURANCE CARD, PICTURE ID OR WALLET DAY OF SURGERY.  ?  ? ? Call this number if you have problems the morning of surgery [phone]  ? ? REMEMBER: NO  SOLID FOODS , CANDY, GUM OR MINTS AFTER MIDNITE THE NITE BEFORE SURGERY .       [facility] CLEAR LIQUIDS UNTIL  1030am               DAY OF SURGERY.      ? ? ?CLEAR LIQUID DIET ? ? ?Foods Allowed      ?WATER ?BLACK COFFEE ( SUGAR OK, NO MILK, CREAM OR CREAMER) REGULAR AND DECAF  ?TEA ( SUGAR OK NO MILK, CREAM, OR CREAMER) REGULAR AND DECAF  ?PLAIN JELLO ( NO RED)  ?FRUIT ICES ( NO RED, NO FRUIT PULP)  ?POPSICLES ( NO RED)  ?JUICE- APPLE, WHITE GRAPE AND WHITE CRANBERRY  ?SPORT DRINK LIKE GATORADE ( NO RED)  ?CLEAR BROTH ( VEGETABLE , CHICKEN OR BEEF)                                                               ? ?    ? ?BRUSH YOUR TEETH MORNING OF SURGERY AND RINSE YOUR MOUTH OUT, NO CHEWING GUM CANDY OR MINTS. ?  ? ? Take these medicines the morning of surgery with A SIP OF WATER:  amlodipine, wellbutrin, synthroid, protonix  ? ? ?DO NOT TAKE ANY DIABETIC MEDICATIONS DAY OF YOUR SURGERY ?                  ?            You may not have any metal on your body including hair pins and  ?            piercings  Do not wear jewelry, make-up, lotions, powders or perfumes, deodorant ?            Do not wear nail polish on your fingernails.   ?           IF YOU ARE A FEMALE AND WANT TO SHAVE UNDER ARMS OR LEGS PRIOR TO SURGERY YOU MUST DO SO AT LEAST 48 HOURS PRIOR TO SURGERY.  ?            Men may shave face and  neck. ? ? Do not bring valuables to the hospital. [facility] NOT ?            RESPONSIBLE   FOR VALUABLES. ? Contacts, dentures or bridgework may not be worn into surgery. ? Leave suitcase in the car. After surgery it may be brought to your room. ? ?  ?  Patients discharged the day of surgery will not be allowed to drive home. IF YOU ARE HAVING SURGERY AND GOING HOME THE SAME DAY, YOU MUST HAVE AN ADULT TO DRIVE YOU HOME AND BE WITH YOU FOR 24 HOURS. YOU MAY GO HOME BY TAXI OR UBER OR ORTHERWISE, BUT AN ADULT MUST ACCOMPANY YOU HOME AND STAY WITH YOU FOR 24 HOURS. ?  ? ?            Please read over the following fact sheets you were given: ?_____________________________________________________________________ ? ?[facility] - Preparing for Surgery ?Before surgery, you can play an important role.  Because skin is not sterile, your skin needs to be as free of germs as possible.  You can reduce the number of germs on your skin by washing with CHG (chlorahexidine gluconate) soap before surgery.  CHG is an antiseptic cleaner which kills germs and bonds with the skin to continue killing germs even after washing. ?Please DO NOT use if you have an allergy to CHG or antibacterial soaps.  If your skin becomes reddened/irritated stop using the CHG and inform your nurse when you arrive at Short Stay. ?Do not shave (including legs and underarms) for at least 48 hours prior to the first CHG shower.  You may shave your face/neck. ?Please follow these instructions carefully: ? 1.  Shower with CHG Soap the night before surgery and the  morning of Surgery. ? 2.  If you choose to wash your hair, wash your hair first as usual with your  normal  shampoo. ? 3.  After you shampoo, rinse your hair and body thoroughly to remove the  shampoo.                           4.  Use CHG as you would any other liquid soap.  You can apply chg directly  to the skin and wash  ?                     Gently with a scrungie or clean washcloth. ? 5.   Apply the CHG Soap to your body ONLY FROM THE NECK DOWN.   Do not use on face/ open      ?                     Wound or open sores. Avoid contact with eyes, ears mouth and genitals (private parts).  ?                     Production manager,  Genitals (private parts) with your normal soap. ?            6.  Wash thoroughly, paying special attention to the area where your surgery  will be performed. ? 7.  Thoroughly rinse your body with warm water from the neck down. ? 8.  DO NOT shower/wash with your normal soap after using and rinsing off  the CHG Soap. ?               9.  Pat yourself dry with a clean towel. ?           10.  Wear clean pajamas. ?           11.  Place clean sheets on your bed the night of your first shower and do not  sleep with pets. ?Day of Surgery : ?Do not apply any lotions/deodorants the  morning of surgery.  Please wear clean clothes to the hospital/surgery center. ? ?FAILURE TO FOLLOW THESE INSTRUCTIONS MAY RESULT IN THE CANCELLATION OF YOUR SURGERY ?PATIENT SIGNATURE_________________________________ ? ?NURSE SIGNATURE__________________________________ ? ?________________________________________________________________________  ? ? ?           ?

## 2022-03-06 ENCOUNTER — Other Ambulatory Visit: Payer: Self-pay

## 2022-03-06 ENCOUNTER — Encounter (HOSPITAL_COMMUNITY)
Admission: RE | Admit: 2022-03-06 | Discharge: 2022-03-06 | Disposition: A | Discharge status: Home / Self Care | Payer: Medicare Other | Source: Ambulatory Visit | Attending: Urology | Admitting: Urology

## 2022-03-06 ENCOUNTER — Encounter (HOSPITAL_COMMUNITY): Payer: Self-pay

## 2022-03-06 VITALS — BP 130/94 | HR 104 | Temp 98.5°F | Resp 16 | Ht 63.0 in | Wt 169.8 lb

## 2022-03-06 DIAGNOSIS — Z01812 Encounter for preprocedural laboratory examination: Secondary | ICD-10-CM | POA: Diagnosis present

## 2022-03-06 DIAGNOSIS — Z01818 Encounter for other preprocedural examination: Secondary | ICD-10-CM

## 2022-03-06 LAB — BASIC METABOLIC PANEL
Anion gap: 10 (ref 5–15)
BUN: 21 mg/dL (ref 8–23)
CO2: 22 mmol/L (ref 22–32)
Calcium: 9.1 mg/dL (ref 8.9–10.3)
Chloride: 105 mmol/L (ref 98–111)
Creatinine, Ser: 1.38 mg/dL — ABNORMAL HIGH (ref 0.44–1.00)
GFR, Estimated: 42 mL/min — ABNORMAL LOW (ref >60)
Glucose, Bld: 100 mg/dL — ABNORMAL HIGH (ref 70–99)
Potassium: 4.7 mmol/L (ref 3.5–5.1)
Sodium: 137 mmol/L (ref 135–145)

## 2022-03-07 ENCOUNTER — Ambulatory Visit (HOSPITAL_COMMUNITY)
Admission: RE | Admit: 2022-03-07 | Discharge: 2022-03-07 | Disposition: A | Discharge status: Home / Self Care | Payer: Medicare Other | Source: Ambulatory Visit | Attending: Urology | Admitting: Urology

## 2022-03-07 ENCOUNTER — Encounter (HOSPITAL_COMMUNITY): Payer: Self-pay | Admitting: Urology

## 2022-03-07 ENCOUNTER — Ambulatory Visit (HOSPITAL_COMMUNITY): Payer: Medicare Other

## 2022-03-07 ENCOUNTER — Ambulatory Visit (HOSPITAL_COMMUNITY): Payer: Medicare Other | Admitting: Physician Assistant

## 2022-03-07 ENCOUNTER — Ambulatory Visit (HOSPITAL_BASED_OUTPATIENT_CLINIC_OR_DEPARTMENT_OTHER): Payer: Medicare Other | Admitting: Anesthesiology

## 2022-03-07 ENCOUNTER — Encounter (HOSPITAL_COMMUNITY)
Admission: RE | Disposition: A | Discharge status: Home / Self Care | Payer: Self-pay | Source: Ambulatory Visit | Attending: Urology

## 2022-03-07 DIAGNOSIS — M1990 Unspecified osteoarthritis, unspecified site: Secondary | ICD-10-CM | POA: Diagnosis not present

## 2022-03-07 DIAGNOSIS — I10 Essential (primary) hypertension: Secondary | ICD-10-CM

## 2022-03-07 DIAGNOSIS — Z7901 Long term (current) use of anticoagulants: Secondary | ICD-10-CM | POA: Diagnosis not present

## 2022-03-07 DIAGNOSIS — C5700 Malignant neoplasm of unspecified fallopian tube: Secondary | ICD-10-CM | POA: Diagnosis not present

## 2022-03-07 DIAGNOSIS — G4730 Sleep apnea, unspecified: Secondary | ICD-10-CM | POA: Insufficient documentation

## 2022-03-07 DIAGNOSIS — F32A Depression, unspecified: Secondary | ICD-10-CM | POA: Diagnosis not present

## 2022-03-07 DIAGNOSIS — Z79899 Other long term (current) drug therapy: Secondary | ICD-10-CM | POA: Insufficient documentation

## 2022-03-07 DIAGNOSIS — D638 Anemia in other chronic diseases classified elsewhere: Secondary | ICD-10-CM

## 2022-03-07 DIAGNOSIS — K219 Gastro-esophageal reflux disease without esophagitis: Secondary | ICD-10-CM | POA: Insufficient documentation

## 2022-03-07 DIAGNOSIS — E039 Hypothyroidism, unspecified: Secondary | ICD-10-CM

## 2022-03-07 DIAGNOSIS — Z86711 Personal history of pulmonary embolism: Secondary | ICD-10-CM | POA: Insufficient documentation

## 2022-03-07 DIAGNOSIS — N131 Hydronephrosis with ureteral stricture, not elsewhere classified: Secondary | ICD-10-CM | POA: Insufficient documentation

## 2022-03-07 DIAGNOSIS — F419 Anxiety disorder, unspecified: Secondary | ICD-10-CM | POA: Insufficient documentation

## 2022-03-07 DIAGNOSIS — N135 Crossing vessel and stricture of ureter without hydronephrosis: Secondary | ICD-10-CM

## 2022-03-07 DIAGNOSIS — N1339 Other hydronephrosis: Secondary | ICD-10-CM

## 2022-03-07 DIAGNOSIS — N130 Hydronephrosis with ureteropelvic junction obstruction: Secondary | ICD-10-CM | POA: Diagnosis present

## 2022-03-07 HISTORY — PX: CYSTOSCOPY W/ URETERAL STENT PLACEMENT: SHX1429

## 2022-03-07 LAB — URINE CULTURE

## 2022-03-07 SURGERY — CYSTOSCOPY, FLEXIBLE, WITH STENT REPLACEMENT
Anesthesia: General | Site: Urethra | Laterality: Left

## 2022-03-07 MED ORDER — 0.9 % SODIUM CHLORIDE (POUR BTL) OPTIME
TOPICAL | Status: DC | PRN
  Administered 2022-03-07: 1000 mL

## 2022-03-07 MED ORDER — FENTANYL CITRATE (PF) 100 MCG/2ML IJ SOLN
INTRAMUSCULAR | Status: AC
  Filled 2022-03-07: qty 2

## 2022-03-07 MED ORDER — ONDANSETRON HCL 4 MG/2ML IJ SOLN
INTRAMUSCULAR | Status: DC | PRN
  Administered 2022-03-07: 4 mg via INTRAVENOUS

## 2022-03-07 MED ORDER — LIDOCAINE HCL URETHRAL/MUCOSAL 2 % EX GEL
CUTANEOUS | Status: AC
  Filled 2022-03-07: qty 30

## 2022-03-07 MED ORDER — ORAL CARE MOUTH RINSE: 15.0000 mL | Freq: Once | OROMUCOSAL | Status: AC

## 2022-03-07 MED ORDER — MIDAZOLAM HCL 2 MG/2ML IJ SOLN
INTRAMUSCULAR | Status: DC | PRN
  Administered 2022-03-07: 2 mg via INTRAVENOUS

## 2022-03-07 MED ORDER — FENTANYL CITRATE (PF) 250 MCG/5ML IJ SOLN
INTRAMUSCULAR | Status: DC | PRN
  Administered 2022-03-07 (×2): 25 ug via INTRAVENOUS
  Administered 2022-03-07: 50 ug via INTRAVENOUS

## 2022-03-07 MED ORDER — LACTATED RINGERS IV SOLN: INTRAVENOUS | Status: DC

## 2022-03-07 MED ORDER — ACETAMINOPHEN 10 MG/ML IV SOLN: 1000.0000 mg | Freq: Once | INTRAVENOUS | Status: DC | PRN

## 2022-03-07 MED ORDER — DEXAMETHASONE SODIUM PHOSPHATE 10 MG/ML IJ SOLN
INTRAMUSCULAR | Status: DC | PRN
  Administered 2022-03-07: 10 mg via INTRAVENOUS

## 2022-03-07 MED ORDER — SODIUM CHLORIDE 0.9 % IR SOLN
Status: DC | PRN
  Administered 2022-03-07: 3000 mL

## 2022-03-07 MED ORDER — PHENYLEPHRINE 80 MCG/ML (10ML) SYRINGE FOR IV PUSH (FOR BLOOD PRESSURE SUPPORT)
PREFILLED_SYRINGE | INTRAVENOUS | Status: DC | PRN
Start: 2022-03-07 — End: 2022-03-07
  Administered 2022-03-07: 80 ug via INTRAVENOUS

## 2022-03-07 MED ORDER — IOHEXOL 300 MG/ML  SOLN
INTRAMUSCULAR | Status: DC | PRN
  Administered 2022-03-07: 20 mL via URETHRAL

## 2022-03-07 MED ORDER — MIDAZOLAM HCL 2 MG/2ML IJ SOLN
INTRAMUSCULAR | Status: AC
  Filled 2022-03-07: qty 2

## 2022-03-07 MED ORDER — CHLORHEXIDINE GLUCONATE 0.12 % MT SOLN
15.0000 mL | Freq: Once | OROMUCOSAL | Status: AC
  Administered 2022-03-07: 15 mL via OROMUCOSAL

## 2022-03-07 MED ORDER — LIDOCAINE 2% (20 MG/ML) 5 ML SYRINGE
INTRAMUSCULAR | Status: DC | PRN
Start: 2022-03-07 — End: 2022-03-07
  Administered 2022-03-07: 80 mg via INTRAVENOUS

## 2022-03-07 MED ORDER — GENTAMICIN SULFATE 40 MG/ML IJ SOLN
400.0000 mg | INTRAVENOUS | Status: AC
  Administered 2022-03-07: 400 mg via INTRAVENOUS
  Filled 2022-03-07: qty 10

## 2022-03-07 MED ORDER — PROPOFOL 10 MG/ML IV BOLUS
INTRAVENOUS | Status: DC | PRN
Start: 2022-03-07 — End: 2022-03-07
  Administered 2022-03-07: 150 mg via INTRAVENOUS

## 2022-03-07 MED ORDER — FENTANYL CITRATE PF 50 MCG/ML IJ SOSY: 25.0000 ug | PREFILLED_SYRINGE | INTRAMUSCULAR | Status: DC | PRN

## 2022-03-07 SURGICAL SUPPLY — 17 items
BAG URO CATCHER STRL LF (MISCELLANEOUS) ×2 IMPLANT
CATH URET FLEX-TIP 2 LUMEN 10F (CATHETERS) ×1 IMPLANT
CATH URETL OPEN 5X70 (CATHETERS) ×2 IMPLANT
CLOTH BEACON ORANGE TIMEOUT ST (SAFETY) ×1 IMPLANT
GLOVE BIOGEL PI IND STRL 6.5 (GLOVE) IMPLANT
GLOVE BIOGEL PI INDICATOR 6.5 (GLOVE) ×2
GLOVE SURG LX 7.5 STRW (GLOVE) ×1
GLOVE SURG LX STRL 7.5 STRW (GLOVE) ×1 IMPLANT
GOWN STRL REUS W/ TWL XL LVL3 (GOWN DISPOSABLE) ×1 IMPLANT
GOWN STRL REUS W/TWL XL LVL3 (GOWN DISPOSABLE) ×6
GUIDEWIRE STR DUAL SENSOR (WIRE) ×2 IMPLANT
GUIDEWIRE ZIPWRE .038 STRAIGHT (WIRE) ×1 IMPLANT
MANIFOLD NEPTUNE II (INSTRUMENTS) ×2 IMPLANT
PACK CYSTO (CUSTOM PROCEDURE TRAY) ×2 IMPLANT
Resonance Metallic Ureteral Stent and Introducer ×1 IMPLANT
STENT URETERAL METAL 6X24 (Stent) ×1 IMPLANT
TUBING CONNECTING 10 (TUBING) IMPLANT

## 2022-03-07 NOTE — Discharge Instructions (Signed)
DISCHARGE INSTRUCTIONS FOR KIDNEY STONE/URETERAL STENT  ? ?MEDICATIONS:  ?1.  Resume all your other meds from home - except do not take any extra narcotic pain meds that you may have at home.  ?2.  Call Dr. Louis Meckel if you need any additional pain medication ? ?ACTIVITY:  ?1. No strenuous activity x 1week  ?2. No driving while on narcotic pain medications  ?3. Drink plenty of water  ?4. Continue to walk at home - you can still get blood clots when you are at home, so keep active, but don't over do it.  ?5. May return to work/school tomorrow or when you feel ready  ? ?BATHING:  ?1. You can shower and we recommend daily showers  ? ? ?SIGNS/SYMPTOMS TO CALL:  ?Please call us if you have a fever greater than 101.5, uncontrolled nausea/vomiting, uncontrolled pain, dizziness, unable to urinate, bloody urine, chest pain, shortness of breath, leg swelling, leg pain, redness around wound, drainage from wound, or any other concerns or questions.  ? ?You can reach [place] at [phone].  ? ?FOLLOW-UP:  ?1. You have an appointment in 6 weeks with a ultrasound of your kidneys prior.  ?

## 2022-03-07 NOTE — Anesthesia Preprocedure Evaluation (Addendum)
Anesthesia Evaluation  ?Patient identified by MRN, date of birth, ID band ?Patient awake ? ? ? ?Reviewed: ?Allergy & Precautions, NPO status , Patient's Chart, lab work & pertinent test results ? ?Airway ?Mallampati: II ? ?TM Distance: >3 FB ?Neck ROM: Full ? ? ? Dental ?no notable dental hx. ? ?  ?Pulmonary ?sleep apnea , PE (2020) ?  ?Pulmonary exam normal ? ? ? ? ? ? ? Cardiovascular ?hypertension, Pt. on medications ? ?Rhythm:Regular Rate:Normal ? ? ?  ?Neuro/Psych ? Headaches, Anxiety Depression   ? GI/Hepatic ?Neg liver ROS, GERD  Medicated,  ?Endo/Other  ?Hypothyroidism  ? Renal/GU ?Left hydronephrosis  ?negative genitourinary ?  ?Musculoskeletal ? ?(+) Arthritis , Osteoarthritis,   ? Abdominal ?Normal abdominal exam  (+)   ?Peds ? Hematology ? ?(+) Blood dyscrasia, anemia ,   ?Anesthesia Other Findings ? ? Reproductive/Obstetrics ? ?  ? ? ? ? ? ? ? ? ? ? ? ? ? ?  ?  ? ? ? ? ? ? ? ? ?Anesthesia Physical ?Anesthesia Plan ? ?ASA: 3 ? ?Anesthesia Plan: General  ? ?Post-op Pain Management:   ? ?Induction: Intravenous ? ?PONV Risk Score and Plan: 3 and Ondansetron, Dexamethasone, Treatment may vary due to age or medical condition and Midazolam ? ?Airway Management Planned: LMA ? ?Additional Equipment: None ? ?Intra-op Plan:  ? ?Post-operative Plan: Extubation in OR ? ?Informed Consent: I have reviewed the patients History and Physical, chart, labs and discussed the procedure including the risks, benefits and alternatives for the proposed anesthesia with the patient or authorized representative who has indicated his/her understanding and acceptance.  ? ? ? ?Dental advisory given ? ?Plan Discussed with: CRNA ? ?Anesthesia Plan Comments:   ? ? ? ? ? ?Anesthesia Quick Evaluation ? ?

## 2022-03-07 NOTE — Op Note (Signed)
Preoperative diagnosis:  ?Left malignant ureteral obstruction ? ?Postoperative diagnosis:  ?Same ? ?Procedure: ?Cystoscopy, left retrograde pyelogram with interpretation ?Left ureteral stent removal ?Placement of a left metallic ureteral stent, (6 French G81 cm Cook metallic stent) ? ?Surgeon: Ardis Hughs, MD ? ?Anesthesia: General ? ?Complications: None ? ?Intraoperative findings:  ?#1: The left retrograde pyelogram demonstrated a very tight stricture 4 to 5 cm from the bladder and approximately 2 cm long.  There was proximal hydroureteronephrosis with blunting of the calyces.  There were no filling defects. ? ?EBL: Minimal ? ?Specimens: None ? ?Indication: Anna [name] is a 66 y.o. patient with malignant fallopian tube cancer who has had rapid failure of a traditional.  After reviewing the management options for treatment, she elected to proceed with the above surgical procedure(s). We have discussed the potential benefits and risks of the procedure, side effects of the proposed treatment, the likelihood of the patient achieving the goals of the procedure, and any potential problems that might occur during the procedure or recuperation. Informed consent has been obtained. ? ?Description of procedure: ? ?The patient was taken to the operating room and general anesthesia was induced.  The patient was placed in the dorsal lithotomy position, prepped and draped in the usual sterile fashion, and preoperative antibiotics were administered. A preoperative time-out was performed.  ? ?71 French 30 degree cystoscope was gently passed to the patient's urethra and bladder visual guidance.  Cystoscopy demonstrated normal bladder.  The stent was emanating from the patient's left ureteral orifice.  There is no other significant abnormalities.  I attempted to advance a wire alongside the patient's stent unsuccessfully.  I was unable to navigate it beyond the distal ureter.  The retrograde pyelogram was also  initially performed alongside the stent demonstrating full obstruction of that segment. ? ?I then grasped the stent with a stent grasper and pulled it to the urethral meatus.  I was able to advance the wire up into the renal pelvis and remove the stent over the wire.  Subsequently advanced a second dual-lumen catheter over the wire to the strictured segment.  I then was able to get a sensor wire up across the strictured segment and up into the left kidney.  I was then able to advance a dual-lumen catheter up into the renal pelvis.  I repeated the retrograde pyelogram with the above findings.  Subsequently removed the second wire and the dual-lumen catheter. ? ?I then advanced the 8 French metallic stent sheath over the wire and into the left renal pelvis under fluoroscopic guidance.  I remove the inner portion of the sheath and then advanced the metallic stent ensuring that was placed in the left upper pole under fluoroscopic guidance.  I then subsequently retracted the sheath leaving the stent in place.  It was noted to have a nice curl within the bladder.  Final fluoroscopy confirmed a nice position of the stent in the upper pole as well as a nice curl in the patient's bladder.  Her bladder was subsequently emptied. ? ?The patient was subsequently extubated return to the PACU in stable condition. ? ?Ardis Hughs, M.D. ?  ?

## 2022-03-07 NOTE — H&P (Signed)
66 year old female presents today for discussion of left ureteral obstruction. The patient has a history of fallopian tube cancer 2018. All told she has had 24 cycles of chemotherapy. Her most recent CT scan demonstrated a soft tissue mass in the left pelvis causing obstruction of the left ureter. Fortunately she is not having any associated pain. She does have slightly worsening renal failure. The patient otherwise has no real significant past medical history.  ? ?1/23: The patient underwent left ureteral stent placement about 3 months ago. Since that time she has been tolerating the stent without any significant difficulty. She denies any dysuria hematuria. She denies any incontinence. She denies any pain, frequency, or urgency.  ? ?The patient did have a CAT scan done yesterday for evaluation of her fallopian tube carcinoma. Unfortunately this did demonstrate progression of her metastatic disease. However, there was no hydroureteronephrosis.  ? ?Interval: Today the patient follows up for reevaluation. On her most recent CT scan she was noted to have some progression of her cancer as well as recurrent hydronephrosis and evidence of an obstructed left kidney. The patient denies any pain in her kidney. She is tolerating the stent very well. He was last changed in February, approximately 6 weeks ago.  ? ? ? ?  ?ALLERGIES: Bee stings - Hives, Swelling, Trouble Breathing ?  ? ?MEDICATIONS: Omeprazole 20 mg capsule,delayed release  ?AMLODIPINE BESYLATE PO Daily  ?BUPROPION HCL SR PO Daily  ?Levothyroxine 112 mcg capsule  ?Lidocaine-Prilocaine  ?Norvasc 5 mg tablet  ?Ocuvite Blue Light  ?OMEPRAZOLE PO Daily  ?ONDANSETRON HCL PO PRN  ?Rosuvastatin Calcium 5 mg tablet  ?Wellbutrin Xl 300 mg tablet, extended release 24 hr  ?Xarelto 20 mg tablet  ?Zofran  ?  ? ?GU PSH: Cystoscopy Insert Stent - 12/28/2021, 07/18/2021 ?Cystoscopy Ureteroscopy - 12/28/2021 ?Hysterectomy - about 2018 ? ?  ? ?NON-GU PSH: Appendectomy ?Cataract  Surgery.. - about 2018 ?Cesarean Delivery - about 1985 ?Hysterectomy ? ?  ? ?GU PMH: Hydronephrosis - 10/16/2021, - 07/17/2021 ?Malignant neoplasm of unspecified fallopian tube - 10/16/2021, - 2018 ?  ?   ?PMH Notes: cancer of fallopian tubes  ?retinitis pigmentosa  ?hypothyroidism  ? ?NON-GU PMH: Arthritis ?GERD ?Hypercholesterolemia ?Hypertension ?Hypothyroidism ?Sleep Apnea ?  ? ?FAMILY HISTORY: 1 Daughter - Daughter ?1 son - Son ?Arthritis - Runs In Family ?Breast Cancer - Cousin ?Cervical Cancer - Grandmother ?Congestive Heart Failure - Father ?Diabetes - Grandfather, Grandfather ?Heart Attack - Runs In Family ?Heart Disease - Runs In Family, Runs in Family ?High Blood Pressure - Runs In Family ?Kidney Stones - Brother, Father  ? ?SOCIAL HISTORY: Marital Status: Married ?Preferred Language: [place]; Ethnicity: Not Hispanic Or Latino; Race: White ?Current Smoking Status: Patient has never smoked.  ? ?Tobacco Use Assessment Completed: Used Tobacco in last 30 days? ?Does not use smokeless tobacco. ?Has never drank.  ?Does not use drugs. ?Drinks 1 caffeinated drink per day. ?Has not had a blood transfusion. ?Patient's occupation is/was Retired. ?  ? ?REVIEW OF SYSTEMS:    ?GU Review Female:   Patient denies frequent urination, hard to postpone urination, burning /pain with urination, get up at night to urinate, leakage of urine, stream starts and stops, trouble starting your stream, have to strain to urinate, and being pregnant.  ?Gastrointestinal (Upper):   Patient denies nausea, vomiting, and indigestion/ heartburn.  ?Gastrointestinal (Lower):   Patient denies diarrhea and constipation.  ?Constitutional:   Patient denies fever, night sweats, weight loss, and fatigue.  ?Skin:   Patient denies  skin rash/ lesion and itching.  ?Eyes:   Patient denies blurred vision and double vision.  ?Ears/ Nose/ Throat:   Patient denies sore throat and sinus problems.  ?Hematologic/Lymphatic:   Patient denies swollen glands and easy  bruising.  ?Cardiovascular:   Patient denies leg swelling and chest pains.  ?Respiratory:   Patient denies shortness of breath and cough.  ?Endocrine:   Patient denies excessive thirst.  ?Musculoskeletal:   Patient denies back pain and joint pain.  ?Neurological:   Patient denies headaches and dizziness.  ?Psychologic:   Patient denies depression and anxiety.  ? ?VITAL SIGNS:    ?  02/21/2022 12:39 PM  ?BP 148/92 mmHg  ?Pulse 106 /min  ?Temperature 96.8 F / 36 C  ? ?MULTI-SYSTEM PHYSICAL EXAMINATION:    ?Constitutional: Well-nourished. No physical deformities. Normally developed. Good grooming.  ?Respiratory: Normal breath sounds. No labored breathing, no use of accessory muscles.   ?Cardiovascular: Regular rate and rhythm. No murmur, no gallop. Normal temperature, normal extremity pulses, no swelling, no varicosities.   ? ?  ?Complexity of Data:  ?Source Of History:  Patient  ?Records Review:   Previous Doctor Records, Previous Patient Records, POC Tool  ?Urine Test Review:   Urinalysis  ? ?PROCEDURES:    ? ?     Urinalysis w/Scope ?Dipstick Dipstick Cont'd Micro  ?Color: Yellow Bilirubin: Neg mg/dL WBC/hpf: 20 - 40/hpf  ?Appearance: Slightly Cloudy Ketones: Neg mg/dL RBC/hpf: 0 - 2/hpf  ?Specific Gravity: 1.025 Blood: 1+ ery/uL Bacteria: Mod (26-50/hpf)  ?pH: <=5.0 Protein: 2+ mg/dL Cystals: NS (Not Seen)  ?Glucose: Neg mg/dL Urobilinogen: 0.2 mg/dL Casts: NS (Not Seen)  ?  Nitrites: Neg Trichomonas: Not Present  ?  Leukocyte Esterase: 2+ leu/uL Mucous: Present  ?    Epithelial Cells: 6 - 10/hpf  ?    Yeast: NS (Not Seen)  ?    Sperm: Not Present  ? ? ?ASSESSMENT:  ?    ICD-10 Details  ?1 GU:   Hydronephrosis - N13.0   ?2   Malignant neoplasm of unspecified fallopian tube - C57.00   ? ?PLAN:    ? ?      Document ?Letter(s):  Created for Patient: Clinical Summary  ? ? ?     Notes:   Unfortunately the patient has had some progression of her cancer causing extrinsic compression of her ureter and obstructing the  kidney. We discussed the CT scan results in detail. We discussed treatment options. We discussed the options of a bigger stent, placement of a metallic stent, or a nephrostomy tube. The patient is opted to proceed with stent placement. I think a metallic stent would be good in this patient given the high-grade nature of her obstruction. This is her best chance of managing it with a stent. We will try to get this scheduled for her in the near future.  ?  ? ?

## 2022-03-07 NOTE — Transfer of Care (Signed)
Immediate Anesthesia Transfer of Care Note ? ?Patient: Anna [name] ? ?Procedure(s) Performed: CYSTOSCOPY WITH LEFT STENT REMOVAL AND INSERTION OF LEFT METALLIC URETERAL STENT (Left: Urethra) ? ?Patient Location: PACU ? ?Anesthesia Type:General ? ?Level of Consciousness: drowsy ? ?Airway & Oxygen Therapy: Patient Spontanous Breathing ? ?Post-op Assessment: Report given to RN and Post -op Vital signs reviewed and stable ? ?Post vital signs: Reviewed and stable ? ?Last Vitals:  ?Vitals Value Taken Time  ?BP 119/76 03/07/22 1416  ?Temp    ?Pulse 86 03/07/22 1417  ?Resp 15 03/07/22 1417  ?SpO2 96 % 03/07/22 1417  ?Vitals shown include unvalidated device data. ? ?Last Pain:  ?Vitals:  ? 03/07/22 1142  ?TempSrc:   ?PainSc: 0-No pain  ?   ? ?  ? ?Complications: No notable events documented. ?

## 2022-03-07 NOTE — Anesthesia Procedure Notes (Signed)
Procedure Name: LMA Insertion ?Date/Time: 03/07/2022 1:34 PM ?Performed by: Clearnce Sorrel, CRNA ?Pre-anesthesia Checklist: Patient identified, Emergency Drugs available, Suction available and Patient being monitored ?Patient Re-evaluated:Patient Re-evaluated prior to induction ?Oxygen Delivery Method: Circle System Utilized ?Preoxygenation: Pre-oxygenation with 100% oxygen ?Induction Type: IV induction ?Ventilation: Mask ventilation without difficulty ?LMA: LMA inserted ?LMA Size: 4.0 ?Number of attempts: 1 ?Airway Equipment and Method: Bite block ?Placement Confirmation: positive ETCO2 ?Tube secured with: Tape ?Dental Injury: Teeth and Oropharynx as per pre-operative assessment  ? ? ? ? ?

## 2022-03-08 ENCOUNTER — Encounter (HOSPITAL_COMMUNITY): Payer: Self-pay | Admitting: Urology

## 2022-03-08 NOTE — Anesthesia Postprocedure Evaluation (Signed)
Anesthesia Post Note ? ?Patient: Mellanie Dorianne Perret ? ?Procedure(s) Performed: CYSTOSCOPY WITH LEFT STENT REMOVAL AND INSERTION OF LEFT METALLIC URETERAL STENT (Left: Urethra) ? ?  ? ?Patient location during evaluation: PACU ?Anesthesia Type: General ?Level of consciousness: awake and alert ?Pain management: pain level controlled ?Vital Signs Assessment: post-procedure vital signs reviewed and stable ?Respiratory status: spontaneous breathing, nonlabored ventilation and respiratory function stable ?Cardiovascular status: blood pressure returned to baseline ?Postop Assessment: no apparent nausea or vomiting ?Anesthetic complications: no ? ? ?No notable events documented. ? ?Last Vitals:  ?Vitals:  ? 03/07/22 1445 03/07/22 1500  ?BP: 118/84 130/80  ?Pulse: 86 86  ?Resp: 16 15  ?Temp:    ?SpO2: 98% 100%  ?  ?Last Pain:  ?Vitals:  ? 03/07/22 1445  ?TempSrc:   ?PainSc: 0-No pain  ? ?Pain Goal:   ? ?  ?  ?  ?  ?  ?  ?  ? ?Marthenia Rolling ? ? ? ? ?

## 2022-03-09 ENCOUNTER — Other Ambulatory Visit: Payer: Self-pay | Admitting: Oncology

## 2022-03-11 NOTE — Progress Notes (Signed)
?Anna [name] ?OFFICE PROGRESS NOTE ? ? ?Diagnosis: Fallopian tube carcinoma ? ?INTERVAL HISTORY:  ? ?Ms. Lezcano returns as scheduled.  She underwent placement of a metallic left ureteral stent on 03/07/2022.  She reports mild flank discomfort following the procedure.  She has mild nausea.  No emesis.  She is not taking nausea medication.  She reports anorexia predating the procedure.  She has been constipated since the procedure.  She began a laxative regimen yesterday. ? ?Objective: ? ?Vital signs in last 24 hours: ? ?Blood pressure 127/87, pulse (!) 105, temperature 98.1 ?F (36.7 ?C), temperature source Oral, resp. rate 18, height _0  (1.6 m), weight 166 lb 3.2 oz (75.4 kg), SpO2 98 %. ?  ? ?HEENT: No thrush or ulcers ?Resp: Lungs clear bilaterally ?Cardio: Regular rate and rhythm ?GI: Nontender, no apparent ascites, no mass ?Vascular: No leg edema  ? ?Portacath/PICC-without erythema ? ?Lab Results: ? ?Lab Results  ?Component Value Date  ? WBC 7.5 03/12/2022  ? HGB 11.0 (L) 03/12/2022  ? HCT 34.5 (L) 03/12/2022  ? MCV 103.0 (H) 03/12/2022  ? PLT 198 03/12/2022  ? NEUTROABS 4.0 03/12/2022  ? ? ?CMP  ?Lab Results  ?Component Value Date  ? NA 137 03/12/2022  ? K 4.0 03/12/2022  ? CL 100 03/12/2022  ? CO2 25 03/12/2022  ? GLUCOSE 105 (H) 03/12/2022  ? BUN 26 (H) 03/12/2022  ? CREATININE 1.67 (H) 03/12/2022  ? CALCIUM 10.0 03/12/2022  ? PROT 8.2 (H) 03/12/2022  ? ALBUMIN 3.8 03/12/2022  ? AST 43 (H) 03/12/2022  ? ALT 32 03/12/2022  ? ALKPHOS 179 (H) 03/12/2022  ? BILITOT 0.4 03/12/2022  ? GFRNONAA 34 (L) 03/12/2022  ? GFRAA >60 08/10/2020  ? ? ? ?Medications: I have reviewed the patient's current medications. ? ? ?Assessment/Plan: ?eft abdomen/pelvic pain ?CT abdomen/pelvis 06/26/2017-soft tissue implants in the lower anterior peritoneum with implants at the paracolic gutters and left pelvic sidewall ?Negative MYRIAD hereditary cancer panel ?Elevated CA 125 ?CT biopsy of anterior omental mass  07/04/2017-Metastatic carcinoma consistent with a gynecologic primary ?Exploratory laparotomy, total hysterectomy, bilateral salpingo-oophorectomy, and omentectomy 07/17/2017, pathology revealed a high-grade serous carcinoma of the left fallopian tube with metastatic disease to the omentum, right fallopian tube, and bilateral ovaries,pT3,pNx, optimal debulking with small peritoneal studding of the diaphragm and a remaining thin rind of tumor at the posterior peritoneum in the pelvis ?Cycle 1 adjuvant Taxol/carboplatin 08/05/2017 ?Cycle 2 adjuvant Taxol/carboplatin 08/26/2017 ?Cycle 3 adjuvant Taxol/carboplatin 09/17/2017-Taxol dose reduced secondary to neuropathy and bone pain  ?Cycle 4 adjuvant Taxol/carboplatin 10/10/2017 ?Cycle 5 adjuvant Taxol/carboplatin 10/31/2017 ?Cycle 6 adjuvant Taxol/carboplatin 11/21/2017 ?CT abdomen/pelvis 08/08/2019- narrowing of rectosigmoid colon with eccentric serosal implant, 10 mm lymph node at the superior rectal vein, left upper quadrant soft tissue implant, 9 mm precaval node, 15 mm left internal iliac node, no ascites ?Cycle 1 Taxol/carboplatin 08/31/2019 ?Cycle 2 Taxol/carboplatin/Avastin 09/21/2019 ?Cycle 3 Taxol/Carboplatin 10/12/2019 (Avastin discontinued) ?Cycle 4 Taxol/carboplatin November 02, 2019 ?CT abdomen/pelvis 11/17/2019-decrease in size of previously prominent pericaval, left internal iliac, and superior rectal vein nodes, resolved rectosigmoid wall thickening ?Cycle 5 Taxol/carboplatin 11/22/2019 ?Cycle 6 Taxol/carboplatin 12/13/2019 ?Olaparib 01/05/2020 ?CT abdomen/pelvis 07/10/2020-enlarging soft tissue at the distal left ureter, the ureter is not dilated, peritoneal implant adjacent to the splenic flexure, enlarged left superior rectal node, lymph nodes along the course of the distal IMV have enlarged, eccentric thickening of the sigmoid colon not seen  ?Olaraparib discontinued 07/12/2020 ?Cycle 1 Taxol/carboplatin 07/21/2020 ?Cycle 2 Taxol/carboplatin 08/10/2020  (carboplatin dose  reduced secondary to thrombocytopenia) ?Cycle 3 Taxol/carboplatin 09/05/2020 ?Cycle 4 Taxol/carboplatin 09/26/2020 ?Cycle 5 Taxol/carboplatin 10/24/2020 ?CT abdomen/pelvis 11/13/2020-overall stable disease, no new site of metastatic disease, generally stable small abdominal/pelvic lymph nodes, slight enlargement of a peritoneal implant in the left pelvis, slight decrease in size of another pelvic implant ?Cycle 6 Taxol/carboplatin 11/14/2020 ?Cycle 7 Taxol/carboplatin 12/05/2020 ?Cycle 8 Taxol/carboplatin 12/26/2020 ?Cycle 9 Taxol/carboplatin 01/16/2021 ?Cycle 10 Taxol/carboplatin 02/06/2021 ?Cycle 11 Taxol/carboplatin 02/27/2021 ?Cycle 12 Taxol/carboplatin 03/20/2021 ?Cycle 13 Taxol/carboplatin 04/10/2021 ?Cycle 14 Taxol/carboplatin 05/08/2021 ?Cycle 15 Taxol/carboplatin 05/29/2021 ?Cycle 16 Taxol/carboplatin 06/19/2021 ?CT abdomen/pelvis 07/06/2021-increased size of a left pelvic soft tissue mass with obstruction of the distal left ureter and new moderate left hydronephrosis, no change in mild pelvic lymphadenopathy ?Systemic therapy placed on hold ?CT abdomen/pelvis 10/15/2021-new bilateral pulmonary metastases, new hepatic metastases, mild increase in size of left adnexal mass, mild increase in size of sigmoid mesenteric and left upper quadrant omental nodules ?CT head 10/15/2021-large lucency in the left parietal skull concerning for a bone metastasis ?Cycle 1 Doxil 10/23/2021 ?Cycle 2 Doxil/bevacizumab 11/16/2021 ?Cycle 3 Doxil/bevacizumab 12/20/2021 ?Cycle 4 Doxil/bevacizumab 01/15/2022 ?CT abdomen/pelvis 02/11/2022-increased size and number of liver lesions, new right lung nodule, other lung nodules stable, decrease in left adnexal soft tissue mass and peritoneal nodules, progressive left hydronephrosis ?Guardant360 02/12/2022-BRCA1-single copy deletion, T p53, EGFR amplification, tumor mutation burden 17, MSI high-not detected ?Cycle 1 gemcitabine 03/12/2022 ? ?  ?Depression ?  ?3.   Hypothyroid ?  ?4.   Family  history of uterine cancer-paternal grandmother ?  ?5.   Acute onset dyspnea/pleuritic left-sided chest pain 10/02/2019 ?CT chest 10/04/2019-left lower lobe pulmonary emboli, multifocal pneumonia lower lobes ?On Xarelto, status post course of Levaquin. ? ?6.  Acute onset left lower back pain 01/24/2021-resolved after a Medrol Dosepak ? ?7.  Mild elevation of the creatinine, renal ultrasound 05/31/2021-mild left hydronephrosis ?CT 07/06/2021-new moderate left hydronephrosis, left ureter stent placed 07/18/2021 ?Left ureter metallic stent 05/12/6388 ?8.  Neutropenia following cycle 1 Doxil-Udenyca added with cycle 2 ? ? ? ? ?Disposition: ?Ms. Kohn has metastatic fallopian tube carcinoma.  She has clinical and radiologic evidence of disease progression.  She underwent placement of a left ureter metallic stent last week.  Creatinine is still elevated.  Hopeful this will improve over the next few weeks. ? ?The plan is to begin treatment with gemcitabine today.  We again reviewed potential toxicities associated with gemcitabine she agrees to proceed.  We discussed the potential for hematologic toxicity, fever, and rash.  The plan is to proceed without growth factor support.  This will be added with cycle 2 if needed. ? ?She will return for day 8 gemcitabine on 03/19/2022.  She will return for an office visit prior to cycle 3 on 04/02/2022. ? ?[name], MD ? ?03/12/2022  ?9:22 AM ? ? ?

## 2022-03-12 ENCOUNTER — Inpatient Hospital Stay: Payer: Medicare Other

## 2022-03-12 ENCOUNTER — Inpatient Hospital Stay (HOSPITAL_BASED_OUTPATIENT_CLINIC_OR_DEPARTMENT_OTHER): Payer: Medicare Other | Admitting: Oncology

## 2022-03-12 ENCOUNTER — Encounter: Payer: Self-pay | Admitting: *Deleted

## 2022-03-12 VITALS — BP 127/87 | HR 105 | Temp 98.1°F | Resp 18 | Ht 63.0 in | Wt 166.2 lb

## 2022-03-12 DIAGNOSIS — C5702 Malignant neoplasm of left fallopian tube: Secondary | ICD-10-CM

## 2022-03-12 DIAGNOSIS — Z5111 Encounter for antineoplastic chemotherapy: Secondary | ICD-10-CM | POA: Diagnosis not present

## 2022-03-12 LAB — CMP (CANCER CENTER ONLY)
ALT: 32 U/L (ref 0–44)
AST: 43 U/L — ABNORMAL HIGH (ref 15–41)
Albumin: 3.8 g/dL (ref 3.5–5.0)
Alkaline Phosphatase: 179 U/L — ABNORMAL HIGH (ref 38–126)
Anion gap: 12 (ref 5–15)
BUN: 26 mg/dL — ABNORMAL HIGH (ref 8–23)
CO2: 25 mmol/L (ref 22–32)
Calcium: 10 mg/dL (ref 8.9–10.3)
Chloride: 100 mmol/L (ref 98–111)
Creatinine: 1.67 mg/dL — ABNORMAL HIGH (ref 0.44–1.00)
GFR, Estimated: 34 mL/min — ABNORMAL LOW (ref >60)
Glucose, Bld: 105 mg/dL — ABNORMAL HIGH (ref 70–99)
Potassium: 4 mmol/L (ref 3.5–5.1)
Sodium: 137 mmol/L (ref 135–145)
Total Bilirubin: 0.4 mg/dL (ref 0.3–1.2)
Total Protein: 8.2 g/dL — ABNORMAL HIGH (ref 6.5–8.1)

## 2022-03-12 LAB — CBC WITH DIFFERENTIAL (CANCER CENTER ONLY)
Abs Immature Granulocytes: 0.03 10*3/uL (ref 0.00–0.07)
Basophils Absolute: 0 10*3/uL (ref 0.0–0.1)
Basophils Relative: 0 %
Eosinophils Absolute: 0.2 10*3/uL (ref 0.0–0.5)
Eosinophils Relative: 2 %
HCT: 34.5 % — ABNORMAL LOW (ref 36.0–46.0)
Hemoglobin: 11 g/dL — ABNORMAL LOW (ref 12.0–15.0)
Immature Granulocytes: 0 %
Lymphocytes Relative: 34 %
Lymphs Abs: 2.5 10*3/uL (ref 0.7–4.0)
MCH: 32.8 pg (ref 26.0–34.0)
MCHC: 31.9 g/dL (ref 30.0–36.0)
MCV: 103 fL — ABNORMAL HIGH (ref 80.0–100.0)
Monocytes Absolute: 0.8 10*3/uL (ref 0.1–1.0)
Monocytes Relative: 11 %
Neutro Abs: 4 10*3/uL (ref 1.7–7.7)
Neutrophils Relative %: 53 %
Platelet Count: 198 10*3/uL (ref 150–400)
RBC: 3.35 MIL/uL — ABNORMAL LOW (ref 3.87–5.11)
RDW: 14.8 % (ref 11.5–15.5)
WBC Count: 7.5 10*3/uL (ref 4.0–10.5)
nRBC: 0.4 % — ABNORMAL HIGH (ref 0.0–0.2)

## 2022-03-12 MED ORDER — HEPARIN SOD (PORK) LOCK FLUSH 100 UNIT/ML IV SOLN
500.0000 [IU] | Freq: Once | INTRAVENOUS | Status: AC | PRN
  Administered 2022-03-12: 500 [IU]

## 2022-03-12 MED ORDER — SODIUM CHLORIDE 0.9% FLUSH
10.0000 mL | INTRAVENOUS | Status: DC | PRN
  Administered 2022-03-12: 10 mL

## 2022-03-12 MED ORDER — PROCHLORPERAZINE MALEATE 10 MG PO TABS
10.0000 mg | ORAL_TABLET | Freq: Once | ORAL | Status: AC
  Administered 2022-03-12: 10 mg via ORAL
  Filled 2022-03-12: qty 1

## 2022-03-12 MED ORDER — SODIUM CHLORIDE 0.9 % IV SOLN
800.0000 mg/m2 | Freq: Once | INTRAVENOUS | Status: AC
  Administered 2022-03-12: 1482 mg via INTRAVENOUS
  Filled 2022-03-12: qty 26.3

## 2022-03-12 MED ORDER — SODIUM CHLORIDE 0.9 % IV SOLN: Freq: Once | INTRAVENOUS | Status: AC

## 2022-03-12 NOTE — Progress Notes (Signed)
Patient presents for treatment. RN assessment completed along with the following: ? ?Labs/vitals reviewed - Vitals are not all within treatment parameters. Pulse  105--OK to treat ?  ?Labs reviewed by Dr. Benay Spice and are not all within treatment parameters. Creatinine 1.67--OK to treat   ?Weight within 10% of previous measurement - Yes ?Informed consent completed and reflects current therapy/intent - Yes, on date 03/12/22             ?Provider progress note reviewed - Today's provider note is not yet available. I reviewed the most recent oncology provider progress note in chart dated 02/21/22. ?Treatment/Antibody/Supportive plan reviewed - Yes, and First time Gemzar ?S&H and other orders reviewed - Yes, and there are no additional orders identified. ?Previous treatment date reviewed - Yes, and the appropriate amount of time has elapsed between treatments. ?Clinic Hand Off Received from - Cristy Friedlander, RN ? ?Patient to proceed with treatment.  ? ?

## 2022-03-12 NOTE — Patient Instructions (Signed)
[facility]   ?Discharge Instructions: ?Thank you for choosing [facility] to provide your oncology and hematology care.  ? ?If you have a lab appointment with the Cancer Center, please go directly to the Cancer Center and check in at the registration area. ?  ?Wear comfortable clothing and clothing appropriate for easy access to any Portacath or PICC line.  ? ?We strive to give you quality time with your provider. You may need to reschedule your appointment if you arrive late (15 or more minutes).  Arriving late affects you and other patients whose appointments are after yours.  Also, if you miss three or more appointments without notifying the office, you may be dismissed from the clinic at the provider?s discretion.    ?  ?For prescription refill requests, have your pharmacy contact our office and allow 72 hours for refills to be completed.   ? ?Today you received the following chemotherapy and/or immunotherapy agents Gemzar    ?  ?To help prevent nausea and vomiting after your treatment, we encourage you to take your nausea medication as directed. ? ?BELOW ARE SYMPTOMS THAT SHOULD BE REPORTED IMMEDIATELY: ?*FEVER GREATER THAN 100.4 F (38 ?C) OR HIGHER ?*CHILLS OR SWEATING ?*NAUSEA AND VOMITING THAT IS NOT CONTROLLED WITH YOUR NAUSEA MEDICATION ?*UNUSUAL SHORTNESS OF BREATH ?*UNUSUAL BRUISING OR BLEEDING ?*URINARY PROBLEMS (pain or burning when urinating, or frequent urination) ?*BOWEL PROBLEMS (unusual diarrhea, constipation, pain near the anus) ?TENDERNESS IN MOUTH AND THROAT WITH OR WITHOUT PRESENCE OF ULCERS (sore throat, sores in mouth, or a toothache) ?UNUSUAL RASH, SWELLING OR PAIN  ?UNUSUAL VAGINAL DISCHARGE OR ITCHING  ? ?Items with * indicate a potential emergency and should be followed up as soon as possible or go to the Emergency Department if any problems should occur. ? ?Please show the CHEMOTHERAPY ALERT CARD or IMMUNOTHERAPY ALERT CARD at check-in to the  Emergency Department and triage nurse. ? ?Should you have questions after your visit or need to cancel or reschedule your appointment, please contact [facility]  Dept: [phone]  and follow the prompts.  Office hours are 8:00 a.m. to 4:30 p.m. Monday - Friday. Please note that voicemails left after 4:00 p.m. may not be returned until the following business day.  We are closed weekends and major holidays. You have access to a nurse at all times for urgent questions. Please call the main number to the clinic Dept: [phone] and follow the prompts. ? ? ?For any non-urgent questions, you may also contact your provider using MyChart. We now offer e-Visits for anyone 18 and older to request care online for non-urgent symptoms. For details visit [email or web address]. ?  ?Also download the MyChart app! Go to the app store, search "MyChart", open the app, select [facility], and log in with your MyChart username and password. ? ?Due to Covid, a mask is required upon entering the hospital/clinic. If you do not have a mask, one will be given to you upon arrival. For doctor visits, patients may have 1 support person aged 18 or older with them. For treatment visits, patients cannot have anyone with them due to current Covid guidelines and our immunocompromised population.  ? ?Gemcitabine injection ?What is this medication? ?GEMCITABINE (jem SYE ta been) is a chemotherapy drug. This medicine is used to treat many types of cancer like breast cancer, lung cancer, pancreatic cancer, and ovarian cancer. ?This medicine may be used for other purposes; ask your health care provider   or pharmacist if you have questions. ?COMMON BRAND NAME(S): Gemzar, Infugem ?What should I tell my care team before I take this medication? ?They need to know if you have any of these conditions: ?blood disorders ?infection ?kidney disease ?liver disease ?lung or breathing disease, like asthma ?recent or ongoing radiation  therapy ?an unusual or allergic reaction to gemcitabine, other chemotherapy, other medicines, foods, dyes, or preservatives ?pregnant or trying to get pregnant ?breast-feeding ?How should I use this medication? ?This drug is given as an infusion into a vein. It is administered in a hospital or clinic by a specially trained health care professional. ?Talk to your pediatrician regarding the use of this medicine in children. Special care may be needed. ?Overdosage: If you think you have taken too much of this medicine contact a poison control center or emergency room at once. ?NOTE: This medicine is only for you. Do not share this medicine with others. ?What if I miss a dose? ?It is important not to miss your dose. Call your doctor or health care professional if you are unable to keep an appointment. ?What may interact with this medication? ?medicines to increase blood counts like filgrastim, pegfilgrastim, sargramostim ?some other chemotherapy drugs like cisplatin ?vaccines ?Talk to your doctor or health care professional before taking any of these medicines: ?acetaminophen ?aspirin ?ibuprofen ?ketoprofen ?naproxen ?This list may not describe all possible interactions. Give your health care provider a list of all the medicines, herbs, non-prescription drugs, or dietary supplements you use. Also tell them if you smoke, drink alcohol, or use illegal drugs. Some items may interact with your medicine. ?What should I watch for while using this medication? ?Visit your doctor for checks on your progress. This drug may make you feel generally unwell. This is not uncommon, as chemotherapy can affect healthy cells as well as cancer cells. Report any side effects. Continue your course of treatment even though you feel ill unless your doctor tells you to stop. ?In some cases, you may be given additional medicines to help with side effects. Follow all directions for their use. ?Call your doctor or health care professional for  advice if you get a fever, chills or sore throat, or other symptoms of a cold or flu. Do not treat yourself. This drug decreases your body's ability to fight infections. Try to avoid being around people who are sick. ?This medicine may increase your risk to bruise or bleed. Call your doctor or health care professional if you notice any unusual bleeding. ?Be careful brushing and flossing your teeth or using a toothpick because you may get an infection or bleed more easily. If you have any dental work done, tell your dentist you are receiving this medicine. ?Avoid taking products that contain aspirin, acetaminophen, ibuprofen, naproxen, or ketoprofen unless instructed by your doctor. These medicines may hide a fever. ?Do not become pregnant while taking this medicine or for 6 months after stopping it. Women should inform their doctor if they wish to become pregnant or think they might be pregnant. Men should not father a child while taking this medicine and for 3 months after stopping it. There is a potential for serious side effects to an unborn child. Talk to your health care professional or pharmacist for more information. Do not breast-feed an infant while taking this medicine or for at least 1 week after stopping it. ?Men should inform their doctors if they wish to father a child. This medicine may lower sperm counts. Talk with your doctor or health   care professional if you are concerned about your fertility. ?What side effects may I notice from receiving this medication? ?Side effects that you should report to your doctor or health care professional as soon as possible: ?allergic reactions like skin rash, itching or hives, swelling of the face, lips, or tongue ?breathing problems ?pain, redness, or irritation at site where injected ?signs and symptoms of a dangerous change in heartbeat or heart rhythm like chest pain; dizziness; fast or irregular heartbeat; palpitations; feeling faint or lightheaded, falls;  breathing problems ?signs of decreased platelets or bleeding - bruising, pinpoint red spots on the skin, black, tarry stools, blood in the urine ?signs of decreased red blood cells - unusually weak or tired, f

## 2022-03-12 NOTE — Progress Notes (Signed)
Patient seen by Dr. Benay Spice today ? ?Vitals are not all within treatment parameters. Pulse  105--OK to treat ? ?Labs reviewed by Dr. Benay Spice and are not all within treatment parameters. Creatinine 1.67--OK to treat ? ?Per physician team, patient is ready for treatment and there are NO modifications to the treatment plan.  ?

## 2022-03-13 ENCOUNTER — Telehealth: Payer: Self-pay | Admitting: Emergency Medicine

## 2022-03-13 LAB — CA 125: Cancer Antigen (CA) 125: 20.3 U/mL (ref 0.0–38.1)

## 2022-03-13 NOTE — Telephone Encounter (Signed)
24 Hour Callback ?24 hour callback post 1st treatment of Gemzar. ?Pt reports slight chills yesterday and today a dull headache that she states could be coming from her eyes (macular degeneration). Pt denies any N/V/D and is eating well.  Pt understands to call with any issues or questions ?

## 2022-03-17 ENCOUNTER — Other Ambulatory Visit: Payer: Self-pay | Admitting: Oncology

## 2022-03-17 ENCOUNTER — Encounter: Payer: Self-pay | Admitting: Oncology

## 2022-03-18 ENCOUNTER — Other Ambulatory Visit: Payer: Self-pay

## 2022-03-18 ENCOUNTER — Inpatient Hospital Stay: Payer: Medicare Other

## 2022-03-18 ENCOUNTER — Other Ambulatory Visit: Payer: Self-pay | Admitting: Oncology

## 2022-03-18 ENCOUNTER — Inpatient Hospital Stay: Payer: Medicare Other | Attending: Oncology | Admitting: Oncology

## 2022-03-18 ENCOUNTER — Ambulatory Visit (HOSPITAL_BASED_OUTPATIENT_CLINIC_OR_DEPARTMENT_OTHER)
Admission: RE | Admit: 2022-03-18 | Discharge: 2022-03-18 | Disposition: A | Discharge status: Home / Self Care | Payer: Medicare Other | Source: Ambulatory Visit | Attending: Oncology | Admitting: Oncology

## 2022-03-18 VITALS — BP 113/75 | HR 108 | Temp 98.1°F | Resp 18 | Ht 63.0 in | Wt 162.6 lb

## 2022-03-18 DIAGNOSIS — R971 Elevated cancer antigen 125 [CA 125]: Secondary | ICD-10-CM | POA: Insufficient documentation

## 2022-03-18 DIAGNOSIS — D709 Neutropenia, unspecified: Secondary | ICD-10-CM | POA: Diagnosis not present

## 2022-03-18 DIAGNOSIS — E860 Dehydration: Secondary | ICD-10-CM

## 2022-03-18 DIAGNOSIS — R944 Abnormal results of kidney function studies: Secondary | ICD-10-CM | POA: Diagnosis not present

## 2022-03-18 DIAGNOSIS — M545 Low back pain, unspecified: Secondary | ICD-10-CM | POA: Diagnosis not present

## 2022-03-18 DIAGNOSIS — Z86711 Personal history of pulmonary embolism: Secondary | ICD-10-CM | POA: Insufficient documentation

## 2022-03-18 DIAGNOSIS — C5702 Malignant neoplasm of left fallopian tube: Secondary | ICD-10-CM | POA: Insufficient documentation

## 2022-03-18 DIAGNOSIS — Z5111 Encounter for antineoplastic chemotherapy: Secondary | ICD-10-CM | POA: Insufficient documentation

## 2022-03-18 DIAGNOSIS — F329 Major depressive disorder, single episode, unspecified: Secondary | ICD-10-CM | POA: Diagnosis not present

## 2022-03-18 DIAGNOSIS — E039 Hypothyroidism, unspecified: Secondary | ICD-10-CM | POA: Insufficient documentation

## 2022-03-18 DIAGNOSIS — Z95828 Presence of other vascular implants and grafts: Secondary | ICD-10-CM

## 2022-03-18 DIAGNOSIS — C787 Secondary malignant neoplasm of liver and intrahepatic bile duct: Secondary | ICD-10-CM | POA: Insufficient documentation

## 2022-03-18 DIAGNOSIS — Z7901 Long term (current) use of anticoagulants: Secondary | ICD-10-CM | POA: Insufficient documentation

## 2022-03-18 MED ORDER — HEPARIN SOD (PORK) LOCK FLUSH 100 UNIT/ML IV SOLN
500.0000 [IU] | Freq: Once | INTRAVENOUS | Status: AC | PRN
  Administered 2022-03-18: 500 [IU]

## 2022-03-18 MED ORDER — SODIUM CHLORIDE 0.9 % IV SOLN: INTRAVENOUS | Status: DC

## 2022-03-18 MED ORDER — PROCHLORPERAZINE MALEATE 10 MG PO TABS: 10.0000 mg | ORAL_TABLET | Freq: Four times a day (QID) | ORAL | 1 refills | Status: AC | PRN

## 2022-03-18 MED ORDER — SODIUM CHLORIDE 0.9% FLUSH
10.0000 mL | INTRAVENOUS | Status: DC | PRN
  Administered 2022-03-18: 10 mL

## 2022-03-18 NOTE — Progress Notes (Signed)
?[name] ?OFFICE PROGRESS NOTE ? ? ?Diagnosis: Fallopian tube carcinoma ? ?INTERVAL HISTORY:  ? ?Anna [name] returns for an unscheduled visit.  She completed a first treatment with gemcitabine 03/12/2022.  She had chills on the evening of chemotherapy.  No rash.  Beginning 03/15/2022 she noted the onset of pain in the right upper abdomen.  The pain has persisted.  The pain is present at the costal margin and right flank.  The pain is worse with a deep breath or sneeze.  She developed nausea and vomiting beginning 03/16/2022 and has been unable to eat or drink much for the past several days.  She is having bowel movements.  The left abdomen pain following placement of the ureter stent has resolved. ? ?She continues anticoagulation therapy.  No bleeding. ? ?Objective: ? ?Vital signs in last 24 hours: ? ?Blood pressure 113/75, pulse (!) 108, temperature 98.1 ?F (36.7 ?C), temperature source Oral, resp. rate 18, height 5' 3"  (1.6 m), weight 162 lb 9.6 oz (73.8 kg), SpO2 100 %. ?  ? ?Resp: Lungs clear bilaterally ?Cardio: Regular rate and rhythm ?GI: Soft.  No hepatosplenomegaly, no mass, tender at the right costal margin.  She is tender over the lower anterolateral ribs and upper abdomen.  No mass.  No rash. ?Vascular: No leg edema ? ? ?Portacath/PICC-without erythema ? ?Lab Results: ? ?Lab Results  ?Component Value Date  ? WBC 7.5 03/12/2022  ? HGB 11.0 (L) 03/12/2022  ? HCT 34.5 (L) 03/12/2022  ? MCV 103.0 (H) 03/12/2022  ? PLT 198 03/12/2022  ? NEUTROABS 4.0 03/12/2022  ? ? ?CMP  ?Lab Results  ?Component Value Date  ? NA 137 03/12/2022  ? K 4.0 03/12/2022  ? CL 100 03/12/2022  ? CO2 25 03/12/2022  ? GLUCOSE 105 (H) 03/12/2022  ? BUN 26 (H) 03/12/2022  ? CREATININE 1.67 (H) 03/12/2022  ? CALCIUM 10.0 03/12/2022  ? PROT 8.2 (H) 03/12/2022  ? ALBUMIN 3.8 03/12/2022  ? AST 43 (H) 03/12/2022  ? ALT 32 03/12/2022  ? ALKPHOS 179 (H) 03/12/2022  ? BILITOT 0.4 03/12/2022  ? GFRNONAA 34 (L) 03/12/2022  ? GFRAA  >60 08/10/2020  ? ? ?Lab Results  ?Component Value Date  ? CEA1 <1.00 06/27/2017  ? ? ? ?Medications: I have reviewed the patient's current medications. ? ? 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 omentum, right fallopian tube, and bilateral ovaries,pT3,pNx, optimal debulking with small peritoneal studding of the diaphragm and a remaining thin rind of tumor at the posterior peritoneum in the pelvis ?Cycle 1 adjuvant Taxol/carboplatin 08/05/2017 ?Cycle 2 adjuvant Taxol/carboplatin 08/26/2017 ?Cycle 3 adjuvant Taxol/carboplatin 09/17/2017-Taxol dose reduced secondary to neuropathy and bone pain  ?Cycle 4 adjuvant Taxol/carboplatin 10/10/2017 ?Cycle 5 adjuvant Taxol/carboplatin 10/31/2017 ?Cycle 6 adjuvant Taxol/carboplatin 11/21/2017 ?CT abdomen/pelvis 08/08/2019- narrowing of rectosigmoid colon with eccentric serosal implant, 10 mm lymph node at the superior rectal vein, left upper quadrant soft tissue implant, 9 mm precaval node, 15 mm left internal iliac node, no ascites ?Cycle 1 Taxol/carboplatin 08/31/2019 ?Cycle 2 Taxol/carboplatin/Avastin 09/21/2019 ?Cycle 3 Taxol/Carboplatin 10/12/2019 (Avastin discontinued) ?Cycle 4 Taxol/carboplatin November 02, 2019 ?CT abdomen/pelvis 11/17/2019-decrease  in size of previously prominent pericaval, left internal iliac, and superior rectal vein nodes, resolved rectosigmoid wall thickening ?Cycle 5 Taxol/carboplatin 11/22/2019 ?Cycle 6 Taxol/carboplatin  12/13/2019 ?Olaparib 01/05/2020 ?CT abdomen/pelvis 07/10/2020-enlarging soft tissue at the distal left ureter, the ureter is not dilated, peritoneal implant adjacent to the splenic flexure, enlarged left superior rectal node, lymph nodes along the course of the distal IMV have enlarged, eccentric thickening of the sigmoid colon not seen  ?Olaraparib discontinued 07/12/2020 ?Cycle 1 Taxol/carboplatin 07/21/2020 ?Cycle 2 Taxol/carboplatin 08/10/2020 (carboplatin dose reduced secondary to thrombocytopenia) ?Cycle 3 Taxol/carboplatin 09/05/2020 ?Cycle 4 Taxol/carboplatin 09/26/2020 ?Cycle 5 Taxol/carboplatin 10/24/2020 ?CT abdomen/pelvis 11/13/2020-overall stable disease, no new site of metastatic disease, generally stable small abdominal/pelvic lymph nodes, slight enlargement of a peritoneal implant in the left pelvis, slight decrease in size of another pelvic implant ?Cycle 6 Taxol/carboplatin 11/14/2020 ?Cycle 7 Taxol/carboplatin 12/05/2020 ?Cycle 8 Taxol/carboplatin 12/26/2020 ?Cycle 9 Taxol/carboplatin 01/16/2021 ?Cycle 10 Taxol/carboplatin 02/06/2021 ?Cycle 11 Taxol/carboplatin 02/27/2021 ?Cycle 12 Taxol/carboplatin 03/20/2021 ?Cycle 13 Taxol/carboplatin 04/10/2021 ?Cycle 14 Taxol/carboplatin 05/08/2021 ?Cycle 15 Taxol/carboplatin 05/29/2021 ?Cycle 16 Taxol/carboplatin 06/19/2021 ?CT abdomen/pelvis 07/06/2021-increased size of a left pelvic soft tissue mass with obstruction of the distal left ureter and new moderate left hydronephrosis, no change in mild pelvic lymphadenopathy ?Systemic therapy placed on hold ?CT abdomen/pelvis 10/15/2021-new bilateral pulmonary metastases, new hepatic metastases, mild increase in size of left adnexal mass, mild increase in size of sigmoid mesenteric and left upper quadrant omental nodules ?CT head 10/15/2021-large lucency in the left parietal skull concerning for a bone metastasis ?Cycle 1 Doxil 10/23/2021 ?Cycle 2 Doxil/bevacizumab 11/16/2021 ?Cycle 3 Doxil/bevacizumab 12/20/2021 ?Cycle 4  Doxil/bevacizumab 01/15/2022 ?CT abdomen/pelvis 02/11/2022-increased size and number of liver lesions, new right lung nodule, other lung nodules stable, decrease in left adnexal soft tissue mass and peritoneal nodules, progressive left hydronephrosis ?Guardant360 02/12/2022-BRCA1-single copy deletion, T p53, EGFR amplification, tumor mutation burden 17, MSI high-not detected ?Cycle 1 gemcitabine 03/12/2022 ? ?  ?Depression ?  ?3.   Hypothyroid ?  ?4.   Family history of uterine cancer-paternal grandmother ?  ?5.   Acute onset dyspnea/pleuritic left-sided chest pain 10/02/2019 ?CT chest 10/04/2019-left lower lobe pulmonary emboli, multifocal pneumonia lower lobes ?On Xarelto, status post course of Levaquin. ? ?6.  Acute onset left lower back pain 01/24/2021-resolved after a Medrol Dosepak ? ?7.  Mild elevation of the creatinine, renal ultrasound 05/31/2021-mild left hydronephrosis ?CT 07/06/2021-new moderate left hydronephrosis, left ureter stent placed 07/18/2021 ?Left ureter metallic stent 0/60/0459 ?8.  Neutropenia following cycle 1 Doxil-Udenyca added with cycle 2 ? ? ?Disposition: ?Ms. Comella has metastatic fallopian tube carcinoma.  She presents with acute onset right upper abdomen/subcostal pain with associated nausea/vomiting.  The etiology of her symptoms is unclear.  I have a low clinical suspicion for a pulmonary embolism or renal process.  The pain could be related to biliary disease, a bowel process, or metastatic disease involving the liver.  She could have a hemorrhage at the hepatic capsule.  I reviewed CT images from 02/11/2022.  There is a dominant metastasis in the peripheral right liver. ? ?She will receive intravenous fluids today.  We will refer her for a CT of the abdomen and pelvis today.  Ms. Redler will return as scheduled on 03/19/2022.  She will try Compazine for nausea. ? ?[name], MD ? ?03/18/2022  ?4:07 PM ? ? ? ?

## 2022-03-18 NOTE — Progress Notes (Signed)
Pt tolerated fluids well.disconnected to go to CT scan appointment. Pt will return tomorrow for treatment or possible IV fluids. Huber needle d/c'd  tip intact. Pt stable at discharge. Husband accompanied Pt to CT appointment. ?

## 2022-03-18 NOTE — Patient Instructions (Signed)

## 2022-03-19 ENCOUNTER — Other Ambulatory Visit: Payer: Self-pay

## 2022-03-19 ENCOUNTER — Inpatient Hospital Stay: Payer: Medicare Other

## 2022-03-19 VITALS — BP 113/67 | HR 77 | Temp 97.8°F | Resp 18

## 2022-03-19 DIAGNOSIS — C5702 Malignant neoplasm of left fallopian tube: Secondary | ICD-10-CM

## 2022-03-19 DIAGNOSIS — E860 Dehydration: Secondary | ICD-10-CM

## 2022-03-19 DIAGNOSIS — Z5111 Encounter for antineoplastic chemotherapy: Secondary | ICD-10-CM | POA: Diagnosis not present

## 2022-03-19 LAB — CBC WITH DIFFERENTIAL (CANCER CENTER ONLY)
Abs Immature Granulocytes: 0.02 10*3/uL (ref 0.00–0.07)
Basophils Absolute: 0 10*3/uL (ref 0.0–0.1)
Basophils Relative: 0 %
Eosinophils Absolute: 0.1 10*3/uL (ref 0.0–0.5)
Eosinophils Relative: 1 %
HCT: 30.8 % — ABNORMAL LOW (ref 36.0–46.0)
Hemoglobin: 9.8 g/dL — ABNORMAL LOW (ref 12.0–15.0)
Immature Granulocytes: 0 %
Lymphocytes Relative: 37 %
Lymphs Abs: 1.9 10*3/uL (ref 0.7–4.0)
MCH: 31.9 pg (ref 26.0–34.0)
MCHC: 31.8 g/dL (ref 30.0–36.0)
MCV: 100.3 fL — ABNORMAL HIGH (ref 80.0–100.0)
Monocytes Absolute: 0.3 10*3/uL (ref 0.1–1.0)
Monocytes Relative: 6 %
Neutro Abs: 2.9 10*3/uL (ref 1.7–7.7)
Neutrophils Relative %: 56 %
Platelet Count: 109 10*3/uL — ABNORMAL LOW (ref 150–400)
RBC: 3.07 MIL/uL — ABNORMAL LOW (ref 3.87–5.11)
RDW: 14.6 % (ref 11.5–15.5)
WBC Count: 5.2 10*3/uL (ref 4.0–10.5)
nRBC: 0 % (ref 0.0–0.2)

## 2022-03-19 LAB — BASIC METABOLIC PANEL - CANCER CENTER ONLY
Anion gap: 13 (ref 5–15)
BUN: 22 mg/dL (ref 8–23)
CO2: 22 mmol/L (ref 22–32)
Calcium: 9.4 mg/dL (ref 8.9–10.3)
Chloride: 102 mmol/L (ref 98–111)
Creatinine: 1.4 mg/dL — ABNORMAL HIGH (ref 0.44–1.00)
GFR, Estimated: 42 mL/min — ABNORMAL LOW (ref >60)
Glucose, Bld: 105 mg/dL — ABNORMAL HIGH (ref 70–99)
Potassium: 3.6 mmol/L (ref 3.5–5.1)
Sodium: 137 mmol/L (ref 135–145)

## 2022-03-19 LAB — URINALYSIS, COMPLETE (UACMP) WITH MICROSCOPIC
Bilirubin Urine: NEGATIVE
Glucose, UA: NEGATIVE mg/dL
Ketones, ur: NEGATIVE mg/dL
Nitrite: NEGATIVE
Protein, ur: 100 mg/dL — AB
RBC / HPF: >50 RBC/hpf — ABNORMAL HIGH (ref 0–5)
Specific Gravity, Urine: 1.021 (ref 1.005–1.030)
WBC, UA: >50 WBC/hpf — ABNORMAL HIGH (ref 0–5)
pH: 5.5 (ref 5.0–8.0)

## 2022-03-19 MED ORDER — SODIUM CHLORIDE 0.9% FLUSH
10.0000 mL | INTRAVENOUS | Status: DC | PRN
  Administered 2022-03-19: 10 mL

## 2022-03-19 MED ORDER — PROCHLORPERAZINE MALEATE 10 MG PO TABS
10.0000 mg | ORAL_TABLET | Freq: Once | ORAL | Status: AC
  Administered 2022-03-19: 10 mg via ORAL
  Filled 2022-03-19: qty 1

## 2022-03-19 MED ORDER — SODIUM CHLORIDE 0.9 % IV SOLN: Freq: Once | INTRAVENOUS | Status: AC

## 2022-03-19 MED ORDER — SODIUM CHLORIDE 0.9 % IV SOLN: INTRAVENOUS | Status: DC

## 2022-03-19 MED ORDER — CIPROFLOXACIN HCL 500 MG PO TABS
500.0000 mg | ORAL_TABLET | Freq: Two times a day (BID) | ORAL | 0 refills | Status: AC
Start: 2022-03-19 — End: 2022-03-24

## 2022-03-19 MED ORDER — HEPARIN SOD (PORK) LOCK FLUSH 100 UNIT/ML IV SOLN
500.0000 [IU] | Freq: Once | INTRAVENOUS | Status: AC | PRN
  Administered 2022-03-19: 500 [IU]

## 2022-03-19 MED ORDER — SODIUM CHLORIDE 0.9 % IV SOLN
800.0000 mg/m2 | Freq: Once | INTRAVENOUS | Status: AC
  Administered 2022-03-19: 1482 mg via INTRAVENOUS
  Filled 2022-03-19: qty 26.3

## 2022-03-19 NOTE — Progress Notes (Signed)
Patient presents for treatment. RN assessment completed along with the following: ? ?Labs/vitals reviewed - Yes, and within treatment parameters.   ?Weight within 10% of previous measurement - Yes ?Informed consent completed and reflects current therapy/intent - Yes, on date 03/12/2022             ?Provider progress note reviewed - Patient not seen by provider today. Most recent note dated 03/18/2022 reviewed. ?Treatment/Antibody/Supportive plan reviewed - Yes, and there are no adjustments needed for today's treatment. ?S&H and other orders reviewed - Yes, and there are no additional orders identified. ?Previous treatment date reviewed - Yes, and the appropriate amount of time has elapsed between treatments. ?Clinic Hand Off Received from - No ? ?Patient to proceed with treatment.  ? ?

## 2022-03-19 NOTE — Patient Instructions (Signed)
[facility]   ?Discharge Instructions: ?Thank you for choosing [facility] to provide your oncology and hematology care.  ? ?If you have a lab appointment with the [facility], please go directly to the [facility] and check in at the registration area. ?  ?Wear comfortable clothing and clothing appropriate for easy access to any Portacath or PICC line.  ? ?We strive to give you quality time with your provider. You may need to reschedule your appointment if you arrive late (15 or more minutes).  Arriving late affects you and other patients whose appointments are after yours.  Also, if you miss three or more appointments without notifying the office, you may be dismissed from the clinic at the provider?s discretion.    ?  ?For prescription refill requests, have your pharmacy contact our office and allow 72 hours for refills to be completed.   ? ?Today you received the following chemotherapy and/or immunotherapy agents Gemcitabine (GEMZAR).    ?  ?To help prevent nausea and vomiting after your treatment, we encourage you to take your nausea medication as directed. ? ?BELOW ARE SYMPTOMS THAT SHOULD BE REPORTED IMMEDIATELY: ?*FEVER GREATER THAN 100.4 F (38 ?C) OR HIGHER ?*CHILLS OR SWEATING ?*NAUSEA AND VOMITING THAT IS NOT CONTROLLED WITH YOUR NAUSEA MEDICATION ?*UNUSUAL SHORTNESS OF BREATH ?*UNUSUAL BRUISING OR BLEEDING ?*URINARY PROBLEMS (pain or burning when urinating, or frequent urination) ?*BOWEL PROBLEMS (unusual diarrhea, constipation, pain near the anus) ?TENDERNESS IN MOUTH AND THROAT WITH OR WITHOUT PRESENCE OF ULCERS (sore throat, sores in mouth, or a toothache) ?UNUSUAL RASH, SWELLING OR PAIN  ?UNUSUAL VAGINAL DISCHARGE OR ITCHING  ? ?Items with * indicate a potential emergency and should be followed up as soon as possible or go to the Emergency Department if any problems should occur. ? ?Please show the CHEMOTHERAPY ALERT CARD or IMMUNOTHERAPY ALERT CARD at  check-in to the Emergency Department and triage nurse. ? ?Should you have questions after your visit or need to cancel or reschedule your appointment, please contact [facility]  Dept: [phone]  and follow the prompts.  Office hours are 8:00 a.m. to 4:30 p.m. Monday - Friday. Please note that voicemails left after 4:00 p.m. may not be returned until the following business day.  We are closed weekends and major holidays. You have access to a nurse at all times for urgent questions. Please call the main number to the clinic Dept: [phone] and follow the prompts. ? ? ?For any non-urgent questions, you may also contact your provider using MyChart. We now offer e-Visits for anyone 65 and older to request care online for non-urgent symptoms. For details visit [email or web address]. ?  ?Also download the MyChart app! Go to the app store, search "MyChart", open the app, select [facility], and log in with your MyChart username and password. ? ?Due to Covid, a mask is required upon entering the hospital/clinic. If you do not have a mask, one will be given to you upon arrival. For doctor visits, patients may have 1 support person aged 35 or older with them. For treatment visits, patients cannot have anyone with them due to current Covid guidelines and our immunocompromised population.  ? ?Gemcitabine injection ?What is this medication? ?GEMCITABINE (jem SYE ta been) is a chemotherapy drug. This medicine is used to treat many types of cancer like breast cancer, lung cancer, pancreatic cancer, and ovarian cancer. ?This medicine may be used for other purposes; ask your health care  provider or pharmacist if you have questions. ?COMMON BRAND NAME(S): Gemzar, Infugem ?What should I tell my care team before I take this medication? ?They need to know if you have any of these conditions: ?blood disorders ?infection ?kidney disease ?liver disease ?lung or breathing disease, like asthma ?recent or  ongoing radiation therapy ?an unusual or allergic reaction to gemcitabine, other chemotherapy, other medicines, foods, dyes, or preservatives ?pregnant or trying to get pregnant ?breast-feeding ?How should I use this medication? ?This drug is given as an infusion into a vein. It is administered in a hospital or clinic by a specially trained health care professional. ?Talk to your pediatrician regarding the use of this medicine in children. Special care may be needed. ?Overdosage: If you think you have taken too much of this medicine contact a poison control center or emergency room at once. ?NOTE: This medicine is only for you. Do not share this medicine with others. ?What if I miss a dose? ?It is important not to miss your dose. Call your doctor or health care professional if you are unable to keep an appointment. ?What may interact with this medication? ?medicines to increase blood counts like filgrastim, pegfilgrastim, sargramostim ?some other chemotherapy drugs like cisplatin ?vaccines ?Talk to your doctor or health care professional before taking any of these medicines: ?acetaminophen ?aspirin ?ibuprofen ?ketoprofen ?naproxen ?This list may not describe all possible interactions. Give your health care provider a list of all the medicines, herbs, non-prescription drugs, or dietary supplements you use. Also tell them if you smoke, drink alcohol, or use illegal drugs. Some items may interact with your medicine. ?What should I watch for while using this medication? ?Visit your doctor for checks on your progress. This drug may make you feel generally unwell. This is not uncommon, as chemotherapy can affect healthy cells as well as cancer cells. Report any side effects. Continue your course of treatment even though you feel ill unless your doctor tells you to stop. ?In some cases, you may be given additional medicines to help with side effects. Follow all directions for their use. ?Call your doctor or health care  professional for advice if you get a fever, chills or sore throat, or other symptoms of a cold or flu. Do not treat yourself. This drug decreases your body's ability to fight infections. Try to avoid being around people who are sick. ?This medicine may increase your risk to bruise or bleed. Call your doctor or health care professional if you notice any unusual bleeding. ?Be careful brushing and flossing your teeth or using a toothpick because you may get an infection or bleed more easily. If you have any dental work done, tell your dentist you are receiving this medicine. ?Avoid taking products that contain aspirin, acetaminophen, ibuprofen, naproxen, or ketoprofen unless instructed by your doctor. These medicines may hide a fever. ?Do not become pregnant while taking this medicine or for 6 months after stopping it. Women should inform their doctor if they wish to become pregnant or think they might be pregnant. Men should not father a child while taking this medicine and for 3 months after stopping it. There is a potential for serious side effects to an unborn child. Talk to your health care professional or pharmacist for more information. Do not breast-feed an infant while taking this medicine or for at least 1 week after stopping it. ?Men should inform their doctors if they wish to father a child. This medicine may lower sperm counts. Talk with your doctor or  health care professional if you are concerned about your fertility. ?What side effects may I notice from receiving this medication? ?Side effects that you should report to your doctor or health care professional as soon as possible: ?allergic reactions like skin rash, itching or hives, swelling of the face, lips, or tongue ?breathing problems ?pain, redness, or irritation at site where injected ?signs and symptoms of a dangerous change in heartbeat or heart rhythm like chest pain; dizziness; fast or irregular heartbeat; palpitations; feeling faint or  lightheaded, falls; breathing problems ?signs of decreased platelets or bleeding - bruising, pinpoint red spots on the skin, black, tarry stools, blood in the urine ?signs of decreased red blood cells - unusually w

## 2022-03-25 ENCOUNTER — Telehealth: Payer: Self-pay

## 2022-03-25 NOTE — Telephone Encounter (Signed)
-----   Message from Ladell Pier, MD sent at 03/23/2022 12:34 PM EDT ----- ?Please call patient, ask how her rt. Abdomen pain is doing ? ?

## 2022-03-25 NOTE — Telephone Encounter (Signed)
TC to Pt, Pt stated she was feeling much better since she took the antibiotics. Pt states she thinks  the abdomen pain is from when she was gaging and vomiting. She states she thinks she might have aggravated her hernia and states she only gets pain when she sneezes or coughs. Pt was grateful for the follow up call.Pt and was informed, if anything changes to give a call to the office. Pt verbalized understanding. ?

## 2022-03-31 ENCOUNTER — Other Ambulatory Visit: Payer: Self-pay | Admitting: Oncology

## 2022-04-02 ENCOUNTER — Other Ambulatory Visit: Payer: Medicare Other

## 2022-04-02 ENCOUNTER — Inpatient Hospital Stay: Payer: Medicare Other

## 2022-04-02 ENCOUNTER — Inpatient Hospital Stay: Payer: Medicare Other | Admitting: Oncology

## 2022-04-02 ENCOUNTER — Inpatient Hospital Stay (HOSPITAL_BASED_OUTPATIENT_CLINIC_OR_DEPARTMENT_OTHER): Payer: Medicare Other | Admitting: Oncology

## 2022-04-02 VITALS — BP 120/62 | HR 100 | Temp 98.1°F | Resp 18 | Ht 63.0 in | Wt 160.8 lb

## 2022-04-02 VITALS — BP 109/71 | HR 96

## 2022-04-02 DIAGNOSIS — Z5111 Encounter for antineoplastic chemotherapy: Secondary | ICD-10-CM | POA: Diagnosis not present

## 2022-04-02 DIAGNOSIS — C5702 Malignant neoplasm of left fallopian tube: Secondary | ICD-10-CM

## 2022-04-02 DIAGNOSIS — Z95828 Presence of other vascular implants and grafts: Secondary | ICD-10-CM

## 2022-04-02 LAB — CBC WITH DIFFERENTIAL (CANCER CENTER ONLY)
Abs Immature Granulocytes: 0.03 10*3/uL (ref 0.00–0.07)
Basophils Absolute: 0 10*3/uL (ref 0.0–0.1)
Basophils Relative: 0 %
Eosinophils Absolute: 0.2 10*3/uL (ref 0.0–0.5)
Eosinophils Relative: 3 %
HCT: 29.4 % — ABNORMAL LOW (ref 36.0–46.0)
Hemoglobin: 9.3 g/dL — ABNORMAL LOW (ref 12.0–15.0)
Immature Granulocytes: 1 %
Lymphocytes Relative: 53 %
Lymphs Abs: 2.9 10*3/uL (ref 0.7–4.0)
MCH: 31.7 pg (ref 26.0–34.0)
MCHC: 31.6 g/dL (ref 30.0–36.0)
MCV: 100.3 fL — ABNORMAL HIGH (ref 80.0–100.0)
Monocytes Absolute: 1.2 10*3/uL — ABNORMAL HIGH (ref 0.1–1.0)
Monocytes Relative: 21 %
Neutro Abs: 1.2 10*3/uL — ABNORMAL LOW (ref 1.7–7.7)
Neutrophils Relative %: 22 %
Platelet Count: 224 10*3/uL (ref 150–400)
RBC: 2.93 MIL/uL — ABNORMAL LOW (ref 3.87–5.11)
RDW: 16.5 % — ABNORMAL HIGH (ref 11.5–15.5)
WBC Count: 5.5 10*3/uL (ref 4.0–10.5)
nRBC: 0.4 % — ABNORMAL HIGH (ref 0.0–0.2)

## 2022-04-02 LAB — CMP (CANCER CENTER ONLY)
ALT: 48 U/L — ABNORMAL HIGH (ref 0–44)
AST: 80 U/L — ABNORMAL HIGH (ref 15–41)
Albumin: 3.5 g/dL (ref 3.5–5.0)
Alkaline Phosphatase: 330 U/L — ABNORMAL HIGH (ref 38–126)
Anion gap: 13 (ref 5–15)
BUN: 16 mg/dL (ref 8–23)
CO2: 24 mmol/L (ref 22–32)
Calcium: 9.1 mg/dL (ref 8.9–10.3)
Chloride: 100 mmol/L (ref 98–111)
Creatinine: 1.22 mg/dL — ABNORMAL HIGH (ref 0.44–1.00)
GFR, Estimated: 49 mL/min — ABNORMAL LOW (ref >60)
Glucose, Bld: 94 mg/dL (ref 70–99)
Potassium: 3.8 mmol/L (ref 3.5–5.1)
Sodium: 137 mmol/L (ref 135–145)
Total Bilirubin: 0.5 mg/dL (ref 0.3–1.2)
Total Protein: 7.9 g/dL (ref 6.5–8.1)

## 2022-04-02 MED ORDER — SODIUM CHLORIDE 0.9 % IV SOLN: Freq: Once | INTRAVENOUS | Status: AC

## 2022-04-02 MED ORDER — SODIUM CHLORIDE 0.9% FLUSH
10.0000 mL | INTRAVENOUS | Status: DC | PRN
  Administered 2022-04-02: 10 mL

## 2022-04-02 MED ORDER — PROCHLORPERAZINE MALEATE 10 MG PO TABS
10.0000 mg | ORAL_TABLET | Freq: Once | ORAL | Status: AC
  Administered 2022-04-02: 10 mg via ORAL
  Filled 2022-04-02: qty 1

## 2022-04-02 MED ORDER — HEPARIN SOD (PORK) LOCK FLUSH 100 UNIT/ML IV SOLN
500.0000 [IU] | Freq: Once | INTRAVENOUS | Status: AC | PRN
  Administered 2022-04-02: 500 [IU]

## 2022-04-02 MED ORDER — HYDROCODONE-ACETAMINOPHEN 5-325 MG PO TABS: 1.0000 | ORAL_TABLET | ORAL | 0 refills | Status: DC | PRN

## 2022-04-02 MED ORDER — SODIUM CHLORIDE 0.9 % IV SOLN
640.0000 mg/m2 | Freq: Once | INTRAVENOUS | Status: AC
  Administered 2022-04-02: 1140 mg via INTRAVENOUS
  Filled 2022-04-02: qty 26.3

## 2022-04-02 MED ORDER — HEPARIN SOD (PORK) LOCK FLUSH 100 UNIT/ML IV SOLN: 500.0000 [IU] | Freq: Once | INTRAVENOUS | Status: DC | PRN

## 2022-04-02 NOTE — Progress Notes (Signed)
?[facility] ?OFFICE PROGRESS NOTE ? ? ?Diagnosis: Fallopian tube carcinoma ? ?INTERVAL HISTORY:  ? ?Anna [name] complete another treat with gemcitabine on 03/19/2022.  No fever or rash.  She complained of severe abdominal pain when she was here on 03/18/2022.  A CT revealed no acute changes.  The pain improved over the next few days. ?She reports return of the pain in the right subcostal region.  There is associated nausea.  The pain is worse when she lies on her left side.  She has exertional dyspnea.  She has intermittent pain at the left upper anterior chest.  She continues anticoagulation therapy.  No leg swelling.  No difficulty with bowel function. ? ? ? ?Objective: ? ?Vital signs in last 24 hours: ? ?Blood pressure 120/62, pulse 100, temperature 98.1 ?F (36.7 ?C), temperature source Oral, resp. rate 18, height 5' 3"  (1.6 m), weight 72.9 kg, SpO2 99 %. ?  ? ?HEENT: No thrush or ulcers ?Resp: Decreased breath sounds at the lower chest bilaterally, inspiratory rhonchi at the left lower posterior chest ?Cardio: Regular rate and rhythm ?GI: Tender in the right lateral subcostal region, no mass, no hepatosplenomegaly, no apparent ascites ?Vascular: No leg edema ?  ? ?Portacath/PICC-without erythema ? ?Lab Results: ? ?Lab Results  ?Component Value Date  ? WBC 5.5 04/02/2022  ? HGB 9.3 (L) 04/02/2022  ? HCT 29.4 (L) 04/02/2022  ? MCV 100.3 (H) 04/02/2022  ? PLT 224 04/02/2022  ? NEUTROABS 1.2 (L) 04/02/2022  ? ? ?CMP  ?Lab Results  ?Component Value Date  ? NA 137 04/02/2022  ? K 3.8 04/02/2022  ? CL 100 04/02/2022  ? CO2 24 04/02/2022  ? GLUCOSE 94 04/02/2022  ? BUN 16 04/02/2022  ? CREATININE 1.22 (H) 04/02/2022  ? CALCIUM 9.1 04/02/2022  ? PROT 7.9 04/02/2022  ? ALBUMIN 3.5 04/02/2022  ? AST 80 (H) 04/02/2022  ? ALT 48 (H) 04/02/2022  ? ALKPHOS 330 (H) 04/02/2022  ? BILITOT 0.5 04/02/2022  ? GFRNONAA 49 (L) 04/02/2022  ? GFRAA >60 08/10/2020  ? ? ?Lab Results  ?Component Value Date  ? CEA1 <1.00  06/27/2017  ? ? ? ?Medications: I have reviewed the patient's current medications. ? ? ?Assessment/Plan: ? 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 to the omentum, right fallopian tube, and bilateral ovaries,pT3,pNx, optimal debulking with small peritoneal studding of the diaphragm and a remaining thin rind of tumor at the posterior peritoneum in the pelvis ?Cycle 1 adjuvant Taxol/carboplatin 08/05/2017 ?Cycle 2 adjuvant Taxol/carboplatin 08/26/2017 ?Cycle 3 adjuvant Taxol/carboplatin 09/17/2017-Taxol dose reduced secondary to neuropathy and bone pain  ?Cycle 4 adjuvant Taxol/carboplatin 10/10/2017 ?Cycle 5 adjuvant Taxol/carboplatin 10/31/2017 ?Cycle 6 adjuvant Taxol/carboplatin 11/21/2017 ?CT abdomen/pelvis 08/08/2019- narrowing of rectosigmoid colon with eccentric serosal implant, 10 mm lymph node at the superior rectal vein, left upper quadrant soft tissue implant, 9 mm precaval node, 15 mm left internal iliac node, no ascites ?Cycle 1 Taxol/carboplatin 08/31/2019 ?Cycle 2 Taxol/carboplatin/Avastin 09/21/2019 ?Cycle 3 Taxol/Carboplatin 10/12/2019 (Avastin discontinued) ?Cycle 4 Taxol/carboplatin November 02, 2019 ?CT abdomen/pelvis 11/17/2019-decrease in size of previously prominent pericaval, left internal iliac, and superior rectal vein nodes, resolved rectosigmoid wall  thickening ?Cycle 5 Taxol/carboplatin 11/22/2019 ?Cycle 6 Taxol/carboplatin 12/13/2019 ?Olaparib 01/05/2020 ?CT abdomen/pelvis 07/10/2020-enlarging soft  tissue at the distal left ureter, the ureter is not dilated, peritoneal implant adjacent to the splenic flexure, enlarged left superior rectal node, lymph nodes along the course of the distal IMV have enlarged, eccentric thickening of the sigmoid colon not seen  ?Olaraparib discontinued 07/12/2020 ?Cycle 1 Taxol/carboplatin 07/21/2020 ?Cycle 2 Taxol/carboplatin 08/10/2020 (carboplatin dose reduced secondary to thrombocytopenia) ?Cycle 3 Taxol/carboplatin 09/05/2020 ?Cycle 4 Taxol/carboplatin 09/26/2020 ?Cycle 5 Taxol/carboplatin 10/24/2020 ?CT abdomen/pelvis 11/13/2020-overall stable disease, no new site of metastatic disease, generally stable small abdominal/pelvic lymph nodes, slight enlargement of a peritoneal implant in the left pelvis, slight decrease in size of another pelvic implant ?Cycle 6 Taxol/carboplatin 11/14/2020 ?Cycle 7 Taxol/carboplatin 12/05/2020 ?Cycle 8 Taxol/carboplatin 12/26/2020 ?Cycle 9 Taxol/carboplatin 01/16/2021 ?Cycle 10 Taxol/carboplatin 02/06/2021 ?Cycle 11 Taxol/carboplatin 02/27/2021 ?Cycle 12 Taxol/carboplatin 03/20/2021 ?Cycle 13 Taxol/carboplatin 04/10/2021 ?Cycle 14 Taxol/carboplatin 05/08/2021 ?Cycle 15 Taxol/carboplatin 05/29/2021 ?Cycle 16 Taxol/carboplatin 06/19/2021 ?CT abdomen/pelvis 07/06/2021-increased size of a left pelvic soft tissue mass with obstruction of the distal left ureter and new moderate left hydronephrosis, no change in mild pelvic lymphadenopathy ?Systemic therapy placed on hold ?CT abdomen/pelvis 10/15/2021-new bilateral pulmonary metastases, new hepatic metastases, mild increase in size of left adnexal mass, mild increase in size of sigmoid mesenteric and left upper quadrant omental nodules ?CT head 10/15/2021-large lucency in the left parietal skull concerning for a bone metastasis ?Cycle 1 Doxil 10/23/2021 ?Cycle 2 Doxil/bevacizumab 11/16/2021 ?Cycle 3 Doxil/bevacizumab 12/20/2021 ?Cycle 4 Doxil/bevacizumab 01/15/2022 ?CT abdomen/pelvis 02/11/2022-increased size and number of  liver lesions, new right lung nodule, other lung nodules stable, decrease in left adnexal soft tissue mass and peritoneal nodules, progressive left hydronephrosis ?Guardant360 02/12/2022-BRCA1-single copy deletion, T p53, EGFR amplification, tumor mutation burden 17, MSI high-not detected ?Cycle 1 gemcitabine, day 1, day 8-4/25/2023 ?CT chest abdomen and pelvis, no IV contrast, 03/18/2022-new and enlarging bilateral pulmonary nodules, multiple hypodense liver metastases, increase in size of left adnexal mass with adjacent soft tissue nodule ?Cycle 2 gemcitabine 04/02/2022 ? ?  ?Depression ?  ?3.   Hypothyroid ?  ?4.   Family history of uterine cancer-paternal grandmother ?  ?5.   Acute onset dyspnea/pleuritic left-sided chest pain 10/02/2019 ?CT chest 10/04/2019-left lower lobe pulmonary emboli, multifocal pneumonia lower lobes ?On Xarelto, status post course of Levaquin. ? ?6.  Acute onset left lower back pain 01/24/2021-resolved after a Medrol Dosepak ? ?7.  Mild elevation of the creatinine, renal ultrasound 05/31/2021-mild left hydronephrosis ?CT 07/06/2021-new moderate left hydronephrosis, left ureter stent placed 07/18/2021 ?Left ureter metallic stent 7/84/1282 ?8.  Neutropenia following cycle 1 Doxil-Udenyca added with cycle 2 ? ? ?Disposition: ?Anna [name] continues to have pain in the right subcostal region.  I suspect the pain is related to metastatic disease involving the peripheral right liver.  I have a low clinical suspicion for a pulmonary embolism.  She continues anticoagulation therapy.  I reviewed CT images.  I see no evidence of a rib or chest wall lesion. ? ?She will complete a third treatment with gemcitabine today.  The neutrophil count is mildly decreased.  I dose reduced the gemcitabine.  She will call for a fever or symptoms of infection.  She is scheduled for gemcitabine on 04/09/2022. ? ?She will return for an office visit on 04/23/2022.  We will follow-up on the CA125 from today.  She will call for  increased pain or new symptoms in the interim.  I prescribed hydrocodone to use as needed for pain. ? ?  [name], MD ? ?04/02/2022  ?1:47 PM ? ? ?

## 2022-04-02 NOTE — Patient Instructions (Signed)

## 2022-04-02 NOTE — Progress Notes (Signed)
Patient presents for treatment. RN assessment completed along with the following: ? ?Labs/vitals reviewed - Yes, and per Dr. Ladona Mow ok to treat with ANC 1.2, ALT 80.     ?Weight within 10% of previous measurement - Yes ?Informed consent completed and reflects current therapy/intent - Yes, on date 03/12/22             ?Provider progress note reviewed - Today's provider note is not yet available. I reviewed the most recent oncology provider progress note in chart dated 03/18/22. ?Treatment/Antibody/Supportive plan reviewed - Yes, and gemcitabine to be dose reduced.  ?S&H and other orders reviewed - Yes, and there are no additional orders identified. ?Previous treatment date reviewed - Yes, and the appropriate amount of time has elapsed between treatments. ?Clinic Hand Off Received from - Lenox Ponds, LPN.  ? ?Patient to proceed with treatment.   ?

## 2022-04-02 NOTE — Patient Instructions (Signed)
[facility]   ?Discharge Instructions: ?Thank you for choosing [facility] to provide your oncology and hematology care.  ? ?If you have a lab appointment with the [facility], please go directly to the [facility] and check in at the registration area. ?  ?Wear comfortable clothing and clothing appropriate for easy access to any Portacath or PICC line.  ? ?We strive to give you quality time with your provider. You may need to reschedule your appointment if you arrive late (15 or more minutes).  Arriving late affects you and other patients whose appointments are after yours.  Also, if you miss three or more appointments without notifying the office, you may be dismissed from the clinic at the provider?s discretion.    ?  ?For prescription refill requests, have your pharmacy contact our office and allow 72 hours for refills to be completed.   ? ?Today you received the following chemotherapy and/or immunotherapy agents Gemcitabine (GEMZAR).    ?  ?To help prevent nausea and vomiting after your treatment, we encourage you to take your nausea medication as directed. ? ?BELOW ARE SYMPTOMS THAT SHOULD BE REPORTED IMMEDIATELY: ?*FEVER GREATER THAN 100.4 F (38 ?C) OR HIGHER ?*CHILLS OR SWEATING ?*NAUSEA AND VOMITING THAT IS NOT CONTROLLED WITH YOUR NAUSEA MEDICATION ?*UNUSUAL SHORTNESS OF BREATH ?*UNUSUAL BRUISING OR BLEEDING ?*URINARY PROBLEMS (pain or burning when urinating, or frequent urination) ?*BOWEL PROBLEMS (unusual diarrhea, constipation, pain near the anus) ?TENDERNESS IN MOUTH AND THROAT WITH OR WITHOUT PRESENCE OF ULCERS (sore throat, sores in mouth, or a toothache) ?UNUSUAL RASH, SWELLING OR PAIN  ?UNUSUAL VAGINAL DISCHARGE OR ITCHING  ? ?Items with * indicate a potential emergency and should be followed up as soon as possible or go to the Emergency Department if any problems should occur. ? ?Please show the CHEMOTHERAPY ALERT CARD or IMMUNOTHERAPY ALERT CARD at  check-in to the Emergency Department and triage nurse. ? ?Should you have questions after your visit or need to cancel or reschedule your appointment, please contact [facility]  Dept: [phone]  and follow the prompts.  Office hours are 8:00 a.m. to 4:30 p.m. Monday - Friday. Please note that voicemails left after 4:00 p.m. may not be returned until the following business day.  We are closed weekends and major holidays. You have access to a nurse at all times for urgent questions. Please call the main number to the clinic Dept: [phone] and follow the prompts. ? ? ?For any non-urgent questions, you may also contact your provider using MyChart. We now offer e-Visits for anyone 24 and older to request care online for non-urgent symptoms. For details visit [email or web address]. ?  ?Also download the MyChart app! Go to the app store, search "MyChart", open the app, select [facility], and log in with your MyChart username and password. ? ?Due to Covid, a mask is required upon entering the hospital/clinic. If you do not have a mask, one will be given to you upon arrival. For doctor visits, patients may have 1 support person aged 57 or older with them. For treatment visits, patients cannot have anyone with them due to current Covid guidelines and our immunocompromised population.  ? ?Gemcitabine injection ?What is this medication? ?GEMCITABINE (jem SYE ta been) is a chemotherapy drug. This medicine is used to treat many types of cancer like breast cancer, lung cancer, pancreatic cancer, and ovarian cancer. ?This medicine may be used for other purposes; ask your health care  provider or pharmacist if you have questions. ?COMMON BRAND NAME(S): Gemzar, Infugem ?What should I tell my care team before I take this medication? ?They need to know if you have any of these conditions: ?blood disorders ?infection ?kidney disease ?liver disease ?lung or breathing disease, like asthma ?recent or  ongoing radiation therapy ?an unusual or allergic reaction to gemcitabine, other chemotherapy, other medicines, foods, dyes, or preservatives ?pregnant or trying to get pregnant ?breast-feeding ?How should I use this medication? ?This drug is given as an infusion into a vein. It is administered in a hospital or clinic by a specially trained health care professional. ?Talk to your pediatrician regarding the use of this medicine in children. Special care may be needed. ?Overdosage: If you think you have taken too much of this medicine contact a poison control center or emergency room at once. ?NOTE: This medicine is only for you. Do not share this medicine with others. ?What if I miss a dose? ?It is important not to miss your dose. Call your doctor or health care professional if you are unable to keep an appointment. ?What may interact with this medication? ?medicines to increase blood counts like filgrastim, pegfilgrastim, sargramostim ?some other chemotherapy drugs like cisplatin ?vaccines ?Talk to your doctor or health care professional before taking any of these medicines: ?acetaminophen ?aspirin ?ibuprofen ?ketoprofen ?naproxen ?This list may not describe all possible interactions. Give your health care provider a list of all the medicines, herbs, non-prescription drugs, or dietary supplements you use. Also tell them if you smoke, drink alcohol, or use illegal drugs. Some items may interact with your medicine. ?What should I watch for while using this medication? ?Visit your doctor for checks on your progress. This drug may make you feel generally unwell. This is not uncommon, as chemotherapy can affect healthy cells as well as cancer cells. Report any side effects. Continue your course of treatment even though you feel ill unless your doctor tells you to stop. ?In some cases, you may be given additional medicines to help with side effects. Follow all directions for their use. ?Call your doctor or health care  professional for advice if you get a fever, chills or sore throat, or other symptoms of a cold or flu. Do not treat yourself. This drug decreases your body's ability to fight infections. Try to avoid being around people who are sick. ?This medicine may increase your risk to bruise or bleed. Call your doctor or health care professional if you notice any unusual bleeding. ?Be careful brushing and flossing your teeth or using a toothpick because you may get an infection or bleed more easily. If you have any dental work done, tell your dentist you are receiving this medicine. ?Avoid taking products that contain aspirin, acetaminophen, ibuprofen, naproxen, or ketoprofen unless instructed by your doctor. These medicines may hide a fever. ?Do not become pregnant while taking this medicine or for 6 months after stopping it. Women should inform their doctor if they wish to become pregnant or think they might be pregnant. Men should not father a child while taking this medicine and for 3 months after stopping it. There is a potential for serious side effects to an unborn child. Talk to your health care professional or pharmacist for more information. Do not breast-feed an infant while taking this medicine or for at least 1 week after stopping it. ?Men should inform their doctors if they wish to father a child. This medicine may lower sperm counts. Talk with your doctor or  health care professional if you are concerned about your fertility. ?What side effects may I notice from receiving this medication? ?Side effects that you should report to your doctor or health care professional as soon as possible: ?allergic reactions like skin rash, itching or hives, swelling of the face, lips, or tongue ?breathing problems ?pain, redness, or irritation at site where injected ?signs and symptoms of a dangerous change in heartbeat or heart rhythm like chest pain; dizziness; fast or irregular heartbeat; palpitations; feeling faint or  lightheaded, falls; breathing problems ?signs of decreased platelets or bleeding - bruising, pinpoint red spots on the skin, black, tarry stools, blood in the urine ?signs of decreased red blood cells - unusually w

## 2022-04-02 NOTE — Progress Notes (Signed)
Patient seen by Dr. Benay Spice today ? ?Vitals are within treatment parameters. ? ?Labs reviewed by Dr. Benay Spice and are not all within treatment parameters. Per Dr Benay Spice ok to treat with ANC 1.2, and A ST 80 ? ?Per physician team, patient is ready for treatment. Please note that modifications are being made to the treatment plan including     ?Dose reduction today. ?

## 2022-04-03 LAB — CA 125: Cancer Antigen (CA) 125: 66.4 U/mL — ABNORMAL HIGH (ref 0.0–38.1)

## 2022-04-04 ENCOUNTER — Telehealth: Payer: Self-pay

## 2022-04-04 NOTE — Telephone Encounter (Signed)
Pt verbalized understanding.

## 2022-04-04 NOTE — Telephone Encounter (Signed)
-----   Message from Ladell Pier, MD sent at 04/03/2022  8:16 PM EDT ----- Repeat Ca1 25-day of chemotherapy next week, if higher we will consider a repeat CT abdomen

## 2022-04-06 ENCOUNTER — Other Ambulatory Visit: Payer: Self-pay | Admitting: Oncology

## 2022-04-09 ENCOUNTER — Inpatient Hospital Stay: Payer: Medicare Other

## 2022-04-09 ENCOUNTER — Other Ambulatory Visit: Payer: Self-pay

## 2022-04-09 ENCOUNTER — Inpatient Hospital Stay (HOSPITAL_BASED_OUTPATIENT_CLINIC_OR_DEPARTMENT_OTHER): Payer: Medicare Other | Admitting: Oncology

## 2022-04-09 ENCOUNTER — Encounter (HOSPITAL_BASED_OUTPATIENT_CLINIC_OR_DEPARTMENT_OTHER): Payer: Self-pay

## 2022-04-09 ENCOUNTER — Ambulatory Visit (HOSPITAL_BASED_OUTPATIENT_CLINIC_OR_DEPARTMENT_OTHER)
Admission: RE | Admit: 2022-04-09 | Discharge: 2022-04-09 | Disposition: A | Discharge status: Home / Self Care | Payer: Medicare Other | Source: Ambulatory Visit | Attending: Oncology | Admitting: Oncology

## 2022-04-09 ENCOUNTER — Encounter: Payer: Self-pay | Admitting: Oncology

## 2022-04-09 VITALS — BP 109/76 | HR 97 | Temp 98.2°F | Resp 18 | Ht 63.0 in | Wt 160.2 lb

## 2022-04-09 DIAGNOSIS — Z5111 Encounter for antineoplastic chemotherapy: Secondary | ICD-10-CM | POA: Diagnosis not present

## 2022-04-09 DIAGNOSIS — C5702 Malignant neoplasm of left fallopian tube: Secondary | ICD-10-CM | POA: Insufficient documentation

## 2022-04-09 LAB — CBC WITH DIFFERENTIAL (CANCER CENTER ONLY)
Abs Immature Granulocytes: 0.3 10*3/uL — ABNORMAL HIGH (ref 0.00–0.07)
Band Neutrophils: 6 %
Basophils Absolute: 0.1 10*3/uL (ref 0.0–0.1)
Basophils Relative: 1 %
Eosinophils Absolute: 0.1 10*3/uL (ref 0.0–0.5)
Eosinophils Relative: 1 %
HCT: 26.7 % — ABNORMAL LOW (ref 36.0–46.0)
Hemoglobin: 8.7 g/dL — ABNORMAL LOW (ref 12.0–15.0)
Lymphocytes Relative: 25 %
Lymphs Abs: 1.7 10*3/uL (ref 0.7–4.0)
MCH: 32.5 pg (ref 26.0–34.0)
MCHC: 32.6 g/dL (ref 30.0–36.0)
MCV: 99.6 fL (ref 80.0–100.0)
Metamyelocytes Relative: 1 %
Monocytes Absolute: 0.5 10*3/uL (ref 0.1–1.0)
Monocytes Relative: 8 %
Myelocytes: 3 %
Neutro Abs: 4.1 10*3/uL (ref 1.7–7.7)
Neutrophils Relative %: 55 %
Platelet Count: 171 10*3/uL (ref 150–400)
RBC: 2.68 MIL/uL — ABNORMAL LOW (ref 3.87–5.11)
RDW: 16.7 % — ABNORMAL HIGH (ref 11.5–15.5)
WBC Count: 6.8 10*3/uL (ref 4.0–10.5)
nRBC: 1.6 % — ABNORMAL HIGH (ref 0.0–0.2)
nRBC: 3 /100 WBC — ABNORMAL HIGH

## 2022-04-09 LAB — CMP (CANCER CENTER ONLY)
ALT: 96 U/L — ABNORMAL HIGH (ref 0–44)
AST: 132 U/L — ABNORMAL HIGH (ref 15–41)
Albumin: 3.1 g/dL — ABNORMAL LOW (ref 3.5–5.0)
Alkaline Phosphatase: 366 U/L — ABNORMAL HIGH (ref 38–126)
Anion gap: 11 (ref 5–15)
BUN: 15 mg/dL (ref 8–23)
CO2: 24 mmol/L (ref 22–32)
Calcium: 8.6 mg/dL — ABNORMAL LOW (ref 8.9–10.3)
Chloride: 101 mmol/L (ref 98–111)
Creatinine: 1.09 mg/dL — ABNORMAL HIGH (ref 0.44–1.00)
GFR, Estimated: 56 mL/min — ABNORMAL LOW (ref >60)
Glucose, Bld: 95 mg/dL (ref 70–99)
Potassium: 3.7 mmol/L (ref 3.5–5.1)
Sodium: 136 mmol/L (ref 135–145)
Total Bilirubin: 0.6 mg/dL (ref 0.3–1.2)
Total Protein: 7 g/dL (ref 6.5–8.1)

## 2022-04-09 MED ORDER — IOHEXOL 350 MG/ML SOLN
100.0000 mL | Freq: Once | INTRAVENOUS | Status: AC | PRN
  Administered 2022-04-09: 60 mL via INTRAVENOUS

## 2022-04-09 MED ORDER — OXYCODONE HCL 5 MG PO TABS
5.0000 mg | ORAL_TABLET | Freq: Once | ORAL | Status: DC
  Filled 2022-04-09: qty 1

## 2022-04-09 MED ORDER — OXYCODONE HCL 5 MG PO TABS
5.0000 mg | ORAL_TABLET | Freq: Once | ORAL | Status: AC
  Administered 2022-04-09: 5 mg via ORAL

## 2022-04-09 NOTE — Progress Notes (Signed)
Dr Benay Spice notified Pt here today for treatment with worsening pain moved to the upper chest area. Developed cough, "feels like congestion", short of breath walking short distance or talking. Dr Benay Spice wanted to hear how she was compared to last visit.   CMP ordered. No treatment today. Pt will be seen by Dr Benay Spice.

## 2022-04-09 NOTE — Progress Notes (Signed)
[facility] OFFICE PROGRESS NOTE   Diagnosis: Fallopian tube carcinoma  INTERVAL HISTORY:   Anna [name] completed another treatment with gemcitabine 04/02/2022.  She presents today for day 8 chemotherapy.  She reports persistent pain in the right subcostal region.  Hydrocodone helps the pain.  She developed a cough and increased dyspnea beginning 04/05/2022.  The cough and dyspnea have progressed.  Her appetite remains diminished.  Dyspnea improves when she is at rest.  No fever.  Objective:  Vital signs in last 24 hours:  There were no vitals taken for this visit.    HEENT: No thrush or ulcers Resp: Decreased breath sounds and inspiratory rhonchi at the posterior base bilaterally Cardio: Regular rate and rhythm, tachycardia GI: No hepatosplenomegaly, no mass, tender in the right subcostal region Vascular: No leg edema  Portacath/PICC-without erythema  Lab Results:  Lab Results  Component Value Date   WBC 6.8 04/09/2022   HGB 8.7 (L) 04/09/2022   HCT 26.7 (L) 04/09/2022   MCV 99.6 04/09/2022   PLT 171 04/09/2022   NEUTROABS 4.1 04/09/2022    CMP  Lab Results  Component Value Date   NA 136 04/09/2022   K 3.7 04/09/2022   CL 101 04/09/2022   CO2 24 04/09/2022   GLUCOSE 95 04/09/2022   BUN 15 04/09/2022   CREATININE 1.09 (H) 04/09/2022   CALCIUM 8.6 (L) 04/09/2022   PROT 7.0 04/09/2022   ALBUMIN 3.1 (L) 04/09/2022   AST 132 (H) 04/09/2022   ALT 96 (H) 04/09/2022   ALKPHOS 366 (H) 04/09/2022   BILITOT 0.6 04/09/2022   GFRNONAA 56 (L) 04/09/2022   GFRAA >60 08/10/2020    Lab Results  Component Value Date   CEA1 <1.00 06/27/2017    Lab Results  Component Value Date   INR 0.9 08/26/2019   LABPROT 11.8 08/26/2019    Imaging:  CT Angio Chest Pulmonary Embolism (PE) W or WO Contrast  Result Date: 04/09/2022 CLINICAL DATA:  Dyspnea.  History of fallopian tube cancer. EXAM: CT ANGIOGRAPHY CHEST WITH CONTRAST TECHNIQUE: Multidetector CT  imaging of the chest was performed using the standard protocol during bolus administration of intravenous contrast. Multiplanar CT image reconstructions and MIPs were obtained to evaluate the vascular anatomy. RADIATION DOSE REDUCTION: This exam was performed according to the departmental dose-optimization program which includes automated exposure control, adjustment of the mA and/or kV according to patient size and/or use of iterative reconstruction technique. CONTRAST:  59m OMNIPAQUE IOHEXOL 350 MG/ML SOLN COMPARISON:  Mar 18, 2022. FINDINGS: Cardiovascular: Satisfactory opacification of the pulmonary arteries to the segmental level. No evidence of pulmonary embolism. Normal heart size. No pericardial effusion. Mediastinum/Nodes: No enlarged mediastinal, hilar, or axillary lymph nodes. Thyroid gland, trachea, and esophagus demonstrate no significant findings. Lungs/Pleura: No pneumothorax or pleural effusion is noted. Multiple pulmonary nodules are again noted consistent with metastatic disease as described on prior exam. Mild bibasilar subsegmental atelectasis is noted. Upper Abdomen: Hepatic metastases as noted on prior exam are again visualized. Musculoskeletal: No chest wall abnormality. No acute or significant osseous findings. Review of the MIP images confirms the above findings. IMPRESSION: No definite evidence of pulmonary embolus. Continued presence of pulmonary and hepatic metastases as noted on prior exam. Mild bibasilar subsegmental atelectasis is noted. Electronically Signed   By: JMarijo ConceptionM.D.   On: 04/09/2022 11:21    Medications: I have reviewed the patient's current medications.   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 with metastatic disease to the omentum, right fallopian tube, and bilateral ovaries,pT3,pNx, optimal debulking with small peritoneal studding of the diaphragm and a remaining thin rind of tumor at the posterior peritoneum in the pelvis Cycle 1 adjuvant Taxol/carboplatin 08/05/2017 Cycle 2 adjuvant Taxol/carboplatin 08/26/2017 Cycle 3 adjuvant Taxol/carboplatin 09/17/2017-Taxol dose reduced secondary to neuropathy and bone pain  Cycle 4 adjuvant Taxol/carboplatin 10/10/2017 Cycle 5 adjuvant Taxol/carboplatin 10/31/2017 Cycle 6 adjuvant Taxol/carboplatin 11/21/2017 CT abdomen/pelvis 08/08/2019- narrowing of rectosigmoid colon with eccentric serosal implant, 10 mm lymph node at the superior rectal vein, left upper quadrant soft tissue implant, 9 mm precaval node, 15 mm left internal iliac node, no ascites Cycle 1 Taxol/carboplatin 08/31/2019 Cycle 2 Taxol/carboplatin/Avastin 09/21/2019 Cycle 3 Taxol/Carboplatin 10/12/2019 (Avastin discontinued) Cycle 4 Taxol/carboplatin November 02, 2019 CT abdomen/pelvis 11/17/2019-decrease in size of previously prominent pericaval, left internal iliac, and superior rectal vein nodes, resolved rectosigmoid wall thickening Cycle 5 Taxol/carboplatin 11/22/2019 Cycle 6 Taxol/carboplatin 12/13/2019 Olaparib 01/05/2020 CT abdomen/pelvis 07/10/2020-enlarging soft tissue at the distal left ureter, the ureter is not dilated, peritoneal implant adjacent to the splenic flexure, enlarged left superior rectal node, lymph nodes along the course of the distal IMV have enlarged, eccentric thickening of the sigmoid colon not seen  Olaraparib discontinued 07/12/2020 Cycle 1 Taxol/carboplatin 07/21/2020 Cycle 2 Taxol/carboplatin 08/10/2020 (carboplatin dose reduced  secondary to thrombocytopenia) Cycle 3 Taxol/carboplatin 09/05/2020 Cycle 4 Taxol/carboplatin 09/26/2020 Cycle 5 Taxol/carboplatin 10/24/2020 CT abdomen/pelvis 11/13/2020-overall stable disease, no new site of metastatic disease, generally stable small abdominal/pelvic lymph nodes, slight enlargement of a peritoneal implant in the left pelvis, slight decrease in size of another pelvic implant Cycle 6 Taxol/carboplatin 11/14/2020 Cycle 7 Taxol/carboplatin 12/05/2020 Cycle 8 Taxol/carboplatin 12/26/2020 Cycle 9 Taxol/carboplatin 01/16/2021 Cycle 10 Taxol/carboplatin 02/06/2021 Cycle 11 Taxol/carboplatin 02/27/2021 Cycle 12 Taxol/carboplatin 03/20/2021 Cycle 13 Taxol/carboplatin 04/10/2021 Cycle 14 Taxol/carboplatin 05/08/2021 Cycle 15 Taxol/carboplatin 05/29/2021 Cycle 16 Taxol/carboplatin 06/19/2021 CT abdomen/pelvis 07/06/2021-increased size of a left pelvic soft tissue mass with obstruction of the distal left ureter and new moderate left hydronephrosis, no change in mild pelvic lymphadenopathy Systemic therapy placed on hold CT abdomen/pelvis 10/15/2021-new bilateral pulmonary metastases, new hepatic metastases, mild increase in size of left adnexal mass, mild increase in size of sigmoid mesenteric and left upper quadrant omental nodules CT head 10/15/2021-large lucency in the left parietal skull concerning for a bone metastasis Cycle 1 Doxil 10/23/2021 Cycle 2 Doxil/bevacizumab 11/16/2021 Cycle 3 Doxil/bevacizumab 12/20/2021 Cycle 4 Doxil/bevacizumab 01/15/2022 CT abdomen/pelvis 02/11/2022-increased size and number of liver lesions, new right lung nodule, other lung nodules stable, decrease in left adnexal soft tissue mass and peritoneal nodules, progressive left hydronephrosis Guardant360 02/12/2022-BRCA1-single copy deletion, T p53, EGFR amplification, tumor mutation burden 17, MSI high-not detected Cycle 1 gemcitabine, day 1, day 8-4/25/2023 CT chest abdomen and pelvis, no IV contrast, 03/18/2022-new and  enlarging bilateral pulmonary nodules, multiple hypodense liver metastases, increase in size of left adnexal mass with adjacent soft tissue nodule Cycle 2 gemcitabine 04/02/2022 CT angiogram chest 04/09/2022-negative for pulmonary embolism, pulmonary hepatic metastases, bibasilar subsegmental atelectasis    Depression   3.   Hypothyroid   4.   Family history of uterine cancer-paternal grandmother   84.   Acute onset dyspnea/pleuritic left-sided chest pain 10/02/2019  CT chest 10/04/2019-left lower lobe pulmonary emboli, multifocal pneumonia lower lobes On Xarelto, status post course of Levaquin.  6.  Acute onset left lower back pain 01/24/2021-resolved after a Medrol Dosepak  7.  Mild elevation of the creatinine, renal ultrasound 05/31/2021-mild left hydronephrosis CT 07/06/2021-new moderate left hydronephrosis, left ureter stent placed 07/18/2021 Left ureter metallic stent 02/06/253 8.  Neutropenia following cycle 1 Doxil-Udenyca added with cycle 2    Disposition: Ms. Twist has metastatic fallopian tube carcinoma.  Her clinical status has declined over the past several weeks.  She presents today with increased dyspnea, cough, and persistent pain at the right costal margin.  I suspect her symptoms are related to disease progression in the liver and lungs.  The lung nodules do not appear significantly different on the CT today.  I reviewed the CT images with Ms. Dockter and her husband.  I suspect the respiratory symptoms are related to the lung tumor burden and potentially diaphragmatic irritation from liver metastases.  The subcostal pain is likely related to liver metastases.  The CA125 was higher when she was here on 04/02/2022.  We repeated the CA125 today.  Her clinical status has declined while on gemcitabine.  Gemcitabine will be discontinued.  We discussed treatment options including salvage systemic chemotherapy with agents such as docetaxel, Cytoxan, and Navelbine. we discussed a  trial of pembrolizumab.  She is eligible for pembrolizumab based on the high tumor mutation burden on the guardant testing in March.  She would like to proceed with a trial of pembrolizumab.  We reviewed potential toxicities associated with pembrolizumab including the chance of rash, diarrhea, and various autoimmune toxicities.  She agrees to proceed.  The plan is to begin pembrolizumab on 04/12/2022.  She will return for an office visit as scheduled on 04/23/2022.  An immunotherapy plan was entered today.  [name], MD  04/09/2022  1:46 PM

## 2022-04-09 NOTE — Progress Notes (Signed)
@   1138 patient reports her pain has decreased to 5/10.

## 2022-04-10 ENCOUNTER — Telehealth: Payer: Self-pay | Admitting: *Deleted

## 2022-04-10 LAB — CA 125: Cancer Antigen (CA) 125: 88.4 U/mL — ABNORMAL HIGH (ref 0.0–38.1)

## 2022-04-10 NOTE — Telephone Encounter (Signed)
Called Mrs. Andreen make her aware of schedule changes/corrections. No lab/flush on 5/26 No treatment on 6/6  No appointments on 6/16 needed.

## 2022-04-12 ENCOUNTER — Inpatient Hospital Stay: Payer: Medicare Other

## 2022-04-12 VITALS — BP 118/87 | HR 109 | Temp 98.1°F | Resp 17

## 2022-04-12 DIAGNOSIS — Z5111 Encounter for antineoplastic chemotherapy: Secondary | ICD-10-CM | POA: Diagnosis not present

## 2022-04-12 DIAGNOSIS — C5702 Malignant neoplasm of left fallopian tube: Secondary | ICD-10-CM

## 2022-04-12 MED ORDER — SODIUM CHLORIDE 0.9 % IV SOLN: Freq: Once | INTRAVENOUS | Status: AC

## 2022-04-12 MED ORDER — SODIUM CHLORIDE 0.9% FLUSH
10.0000 mL | INTRAVENOUS | Status: DC | PRN
  Administered 2022-04-12: 10 mL

## 2022-04-12 MED ORDER — HEPARIN SOD (PORK) LOCK FLUSH 100 UNIT/ML IV SOLN
500.0000 [IU] | Freq: Once | INTRAVENOUS | Status: AC | PRN
  Administered 2022-04-12: 500 [IU]

## 2022-04-12 MED ORDER — SODIUM CHLORIDE 0.9 % IV SOLN
200.0000 mg | Freq: Once | INTRAVENOUS | Status: AC
  Administered 2022-04-12: 200 mg via INTRAVENOUS
  Filled 2022-04-12: qty 8

## 2022-04-12 NOTE — Patient Instructions (Addendum)
[facility]  Discharge Instructions: Thank you for choosing [facility] to provide your oncology and hematology care.   If you have a lab appointment with the [facility], please go directly to the [facility] and check in at the registration area.   Wear comfortable clothing and clothing appropriate for easy access to any Portacath or PICC line.   We strive to give you quality time with your provider. You may need to reschedule your appointment if you arrive late (15 or more minutes).  Arriving late affects you and other patients whose appointments are after yours.  Also, if you miss three or more appointments without notifying the office, you may be dismissed from the clinic at the provider's discretion.      For prescription refill requests, have your pharmacy contact our office and allow 72 hours for refills to be completed.    Today you received the following chemotherapy and/or immunotherapy agents : Beryle Flock      To help prevent nausea and vomiting after your treatment, we encourage you to take your nausea medication as directed.  BELOW ARE SYMPTOMS THAT SHOULD BE REPORTED IMMEDIATELY: *FEVER GREATER THAN 100.4 F (38 C) OR HIGHER *CHILLS OR SWEATING *NAUSEA AND VOMITING THAT IS NOT CONTROLLED WITH YOUR NAUSEA MEDICATION *UNUSUAL SHORTNESS OF BREATH *UNUSUAL BRUISING OR BLEEDING *URINARY PROBLEMS (pain or burning when urinating, or frequent urination) *BOWEL PROBLEMS (unusual diarrhea, constipation, pain near the anus) TENDERNESS IN MOUTH AND THROAT WITH OR WITHOUT PRESENCE OF ULCERS (sore throat, sores in mouth, or a toothache) UNUSUAL RASH, SWELLING OR PAIN  UNUSUAL VAGINAL DISCHARGE OR ITCHING   Items with * indicate a potential emergency and should be followed up as soon as possible or go to the Emergency Department if any problems should occur.  Please show the CHEMOTHERAPY ALERT CARD or IMMUNOTHERAPY ALERT CARD at check-in to the  Emergency Department and triage nurse.  Should you have questions after your visit or need to cancel or reschedule your appointment, please contact [facility]  Dept: [phone]  and follow the prompts.  Office hours are 8:00 a.m. to 4:30 p.m. Monday - Friday. Please note that voicemails left after 4:00 p.m. may not be returned until the following business day.  We are closed weekends and major holidays. You have access to a nurse at all times for urgent questions. Please call the main number to the clinic Dept: [phone] and follow the prompts.   For any non-urgent questions, you may also contact your provider using MyChart. We now offer e-Visits for anyone 63 and older to request care online for non-urgent symptoms. For details visit [email or web address].   Also download the MyChart app! Go to the app store, search "MyChart", open the app, select [facility], and log in with your MyChart username and password.  Due to Covid, a mask is required upon entering the hospital/clinic. If you do not have a mask, one will be given to you upon arrival. For doctor visits, patients may have 1 support person aged 38 or older with them. For treatment visits, patients cannot have anyone with them due to current Covid guidelines and our immunocompromised population.   Pembrolizumab injection What is this medication? PEMBROLIZUMAB (pem broe liz ue mab) is a monoclonal antibody. It is used to treat certain types of cancer. This medicine may be used for other purposes; ask your health care provider or pharmacist if you have questions. COMMON BRAND NAME(S): [employer]  What should I tell my care team before I take this medication? They need to know if you have any of these conditions: autoimmune diseases like Crohn's disease, ulcerative colitis, or lupus have had or planning to have an allogeneic stem cell transplant (uses someone else's stem cells) history of organ  transplant history of chest radiation nervous system problems like myasthenia gravis or Guillain-Barre syndrome an unusual or allergic reaction to pembrolizumab, other medicines, foods, dyes, or preservatives pregnant or trying to get pregnant breast-feeding How should I use this medication? This medicine is for infusion into a vein. It is given by a health care professional in a hospital or clinic setting. A special MedGuide will be given to you before each treatment. Be sure to read this information carefully each time. Talk to your pediatrician regarding the use of this medicine in children. While this drug may be prescribed for children as young as 6 months for selected conditions, precautions do apply. Overdosage: If you think you have taken too much of this medicine contact a poison control center or emergency room at once. NOTE: This medicine is only for you. Do not share this medicine with others. What if I miss a dose? It is important not to miss your dose. Call your doctor or health care professional if you are unable to keep an appointment. What may interact with this medication? Interactions have not been studied. This list may not describe all possible interactions. Give your health care provider a list of all the medicines, herbs, non-prescription drugs, or dietary supplements you use. Also tell them if you smoke, drink alcohol, or use illegal drugs. Some items may interact with your medicine. What should I watch for while using this medication? Your condition will be monitored carefully while you are receiving this medicine. You may need blood work done while you are taking this medicine. Do not become pregnant while taking this medicine or for 4 months after stopping it. Women should inform their doctor if they wish to become pregnant or think they might be pregnant. There is a potential for serious side effects to an unborn child. Talk to your health care professional or  pharmacist for more information. Do not breast-feed an infant while taking this medicine or for 4 months after the last dose. What side effects may I notice from receiving this medication? Side effects that you should report to your doctor or health care professional as soon as possible: allergic reactions like skin rash, itching or hives, swelling of the face, lips, or tongue bloody or black, tarry breathing problems changes in vision chest pain chills confusion constipation cough diarrhea dizziness or feeling faint or lightheaded fast or irregular heartbeat fever flushing joint pain low blood counts - this medicine may decrease the number of white blood cells, red blood cells and platelets. You may be at increased risk for infections and bleeding. muscle pain muscle weakness pain, tingling, numbness in the hands or feet persistent headache redness, blistering, peeling or loosening of the skin, including inside the mouth signs and symptoms of high blood sugar such as dizziness; dry mouth; dry skin; fruity breath; nausea; stomach pain; increased hunger or thirst; increased urination signs and symptoms of kidney injury like trouble passing urine or change in the amount of urine signs and symptoms of liver injury like dark urine, light-colored stools, loss of appetite, nausea, right upper belly pain, yellowing of the eyes or skin sweating swollen lymph nodes weight loss Side effects that usually do not require  medical attention (report to your doctor or health care professional if they continue or are bothersome): decreased appetite hair loss tiredness This list may not describe all possible side effects. Call your doctor for medical advice about side effects. You may report side effects to FDA at 1-800-FDA-1088. Where should I keep my medication? This drug is given in a hospital or clinic and will not be stored at home. NOTE: This sheet is a summary. It may not cover all possible  information. If you have questions about this medicine, talk to your doctor, pharmacist, or health care provider.  2023 Elsevier/Gold Standard (2021-10-05 00:00:00)

## 2022-04-12 NOTE — Progress Notes (Signed)
Per Dr. Benay Spice: OK to treat today w/pembrolizumab with AST and ALT elevations on 04/09/22. OK to treat w/ pulse 109.

## 2022-04-15 ENCOUNTER — Other Ambulatory Visit: Payer: Self-pay | Admitting: Internal Medicine

## 2022-04-16 ENCOUNTER — Telehealth: Payer: Self-pay | Admitting: Emergency Medicine

## 2022-04-16 NOTE — Telephone Encounter (Signed)
24 Hour Callback 24 Hour Callback post 1st time Keytruda infusion.  No answer, left message with callback number.

## 2022-04-23 ENCOUNTER — Inpatient Hospital Stay: Payer: Medicare Other

## 2022-04-23 ENCOUNTER — Other Ambulatory Visit: Payer: Self-pay

## 2022-04-23 ENCOUNTER — Encounter: Payer: Self-pay | Admitting: *Deleted

## 2022-04-23 ENCOUNTER — Inpatient Hospital Stay: Payer: Medicare Other | Attending: Oncology

## 2022-04-23 ENCOUNTER — Inpatient Hospital Stay (HOSPITAL_BASED_OUTPATIENT_CLINIC_OR_DEPARTMENT_OTHER): Payer: Medicare Other | Admitting: Oncology

## 2022-04-23 VITALS — BP 107/81 | HR 100 | Temp 98.1°F | Resp 18 | Ht 63.0 in | Wt 159.0 lb

## 2022-04-23 DIAGNOSIS — C7982 Secondary malignant neoplasm of genital organs: Secondary | ICD-10-CM | POA: Insufficient documentation

## 2022-04-23 DIAGNOSIS — D696 Thrombocytopenia, unspecified: Secondary | ICD-10-CM | POA: Insufficient documentation

## 2022-04-23 DIAGNOSIS — C7801 Secondary malignant neoplasm of right lung: Secondary | ICD-10-CM | POA: Insufficient documentation

## 2022-04-23 DIAGNOSIS — Z7901 Long term (current) use of anticoagulants: Secondary | ICD-10-CM | POA: Insufficient documentation

## 2022-04-23 DIAGNOSIS — K5900 Constipation, unspecified: Secondary | ICD-10-CM | POA: Insufficient documentation

## 2022-04-23 DIAGNOSIS — Z86711 Personal history of pulmonary embolism: Secondary | ICD-10-CM | POA: Diagnosis not present

## 2022-04-23 DIAGNOSIS — C786 Secondary malignant neoplasm of retroperitoneum and peritoneum: Secondary | ICD-10-CM | POA: Diagnosis not present

## 2022-04-23 DIAGNOSIS — C7963 Secondary malignant neoplasm of bilateral ovaries: Secondary | ICD-10-CM | POA: Diagnosis not present

## 2022-04-23 DIAGNOSIS — G620 Drug-induced polyneuropathy: Secondary | ICD-10-CM | POA: Diagnosis not present

## 2022-04-23 DIAGNOSIS — C5702 Malignant neoplasm of left fallopian tube: Secondary | ICD-10-CM | POA: Diagnosis present

## 2022-04-23 DIAGNOSIS — Z95828 Presence of other vascular implants and grafts: Secondary | ICD-10-CM

## 2022-04-23 DIAGNOSIS — C7802 Secondary malignant neoplasm of left lung: Secondary | ICD-10-CM | POA: Insufficient documentation

## 2022-04-23 DIAGNOSIS — C569 Malignant neoplasm of unspecified ovary: Secondary | ICD-10-CM

## 2022-04-23 DIAGNOSIS — C787 Secondary malignant neoplasm of liver and intrahepatic bile duct: Secondary | ICD-10-CM | POA: Insufficient documentation

## 2022-04-23 DIAGNOSIS — E039 Hypothyroidism, unspecified: Secondary | ICD-10-CM | POA: Insufficient documentation

## 2022-04-23 DIAGNOSIS — F32A Depression, unspecified: Secondary | ICD-10-CM | POA: Insufficient documentation

## 2022-04-23 LAB — CMP (CANCER CENTER ONLY)
ALT: 51 U/L — ABNORMAL HIGH (ref 0–44)
AST: 160 U/L — ABNORMAL HIGH (ref 15–41)
Albumin: 2.7 g/dL — ABNORMAL LOW (ref 3.5–5.0)
Alkaline Phosphatase: 560 U/L — ABNORMAL HIGH (ref 38–126)
Anion gap: 13 (ref 5–15)
BUN: 18 mg/dL (ref 8–23)
CO2: 24 mmol/L (ref 22–32)
Calcium: 8.6 mg/dL — ABNORMAL LOW (ref 8.9–10.3)
Chloride: 96 mmol/L — ABNORMAL LOW (ref 98–111)
Creatinine: 1.12 mg/dL — ABNORMAL HIGH (ref 0.44–1.00)
GFR, Estimated: 55 mL/min — ABNORMAL LOW (ref >60)
Glucose, Bld: 101 mg/dL — ABNORMAL HIGH (ref 70–99)
Potassium: 3.7 mmol/L (ref 3.5–5.1)
Sodium: 133 mmol/L — ABNORMAL LOW (ref 135–145)
Total Bilirubin: 2 mg/dL — ABNORMAL HIGH (ref 0.3–1.2)
Total Protein: 7.1 g/dL (ref 6.5–8.1)

## 2022-04-23 LAB — CBC WITH DIFFERENTIAL (CANCER CENTER ONLY)
Abs Immature Granulocytes: 0.37 10*3/uL — ABNORMAL HIGH (ref 0.00–0.07)
Basophils Absolute: 0.1 10*3/uL (ref 0.0–0.1)
Basophils Relative: 1 %
Eosinophils Absolute: 0.3 10*3/uL (ref 0.0–0.5)
Eosinophils Relative: 3 %
HCT: 27 % — ABNORMAL LOW (ref 36.0–46.0)
Hemoglobin: 8.9 g/dL — ABNORMAL LOW (ref 12.0–15.0)
Immature Granulocytes: 4 %
Lymphocytes Relative: 21 %
Lymphs Abs: 1.9 10*3/uL (ref 0.7–4.0)
MCH: 32.5 pg (ref 26.0–34.0)
MCHC: 33 g/dL (ref 30.0–36.0)
MCV: 98.5 fL (ref 80.0–100.0)
Monocytes Absolute: 1.5 10*3/uL — ABNORMAL HIGH (ref 0.1–1.0)
Monocytes Relative: 17 %
Neutro Abs: 4.9 10*3/uL (ref 1.7–7.7)
Neutrophils Relative %: 54 %
Platelet Count: 248 10*3/uL (ref 150–400)
RBC: 2.74 MIL/uL — ABNORMAL LOW (ref 3.87–5.11)
RDW: 21.4 % — ABNORMAL HIGH (ref 11.5–15.5)
WBC Count: 9 10*3/uL (ref 4.0–10.5)
nRBC: 1.7 % — ABNORMAL HIGH (ref 0.0–0.2)

## 2022-04-23 MED ORDER — HYDROCODONE-ACETAMINOPHEN 5-325 MG PO TABS: 1.0000 | ORAL_TABLET | ORAL | 0 refills | Status: AC | PRN

## 2022-04-23 MED ORDER — SORBITOL 70 % PO SOLN: ORAL | 0 refills | Status: AC

## 2022-04-23 MED ORDER — SODIUM CHLORIDE 0.9% FLUSH
10.0000 mL | INTRAVENOUS | Status: DC | PRN
  Administered 2022-04-23: 10 mL

## 2022-04-23 MED ORDER — HEPARIN SOD (PORK) LOCK FLUSH 100 UNIT/ML IV SOLN
500.0000 [IU] | Freq: Once | INTRAVENOUS | Status: AC | PRN
  Administered 2022-04-23: 500 [IU]

## 2022-04-23 NOTE — Patient Instructions (Signed)

## 2022-04-23 NOTE — Progress Notes (Signed)
Folic Acid Receptor Alpha testing ordered on pathology accession number [id number]

## 2022-04-23 NOTE — Progress Notes (Signed)
[facility] OFFICE PROGRESS NOTE   Diagnosis: Fallopian tube carcinoma  INTERVAL HISTORY:   Ms. Gens completed a first treatment with pembrolizumab on 04/12/2022.  No rash or diarrhea.  She continues to have pain in the right greater than left upper abdomen.  She has exertional dyspnea.  She has been constipated for the past 5-6 days.  She is passing flatus.  She has tried multiple laxatives.  She has intermittent nausea and vomiting.  She is tolerating liquids.  She stays in the house most of the time.  She takes hydrocodone for relief of pain.  Objective:  Vital signs in last 24 hours:  Blood pressure 107/81, pulse 100, temperature 98.1 F (36.7 C), temperature source Oral, resp. rate 18, height 5' 3"  (1.6 m), weight 159 lb (72.1 kg), SpO2 98 %.    HEENT: No thrush or ulcers Resp: Lungs clear bilaterally Cardio: Regular rate and rhythm GI: Mildly distended in the upper abdomen with tenderness, no discrete mass Vascular: No leg edema    Portacath/PICC-without erythema  Lab Results:  Lab Results  Component Value Date   WBC 9.0 04/23/2022   HGB 8.9 (L) 04/23/2022   HCT 27.0 (L) 04/23/2022   MCV 98.5 04/23/2022   PLT 248 04/23/2022   NEUTROABS 4.9 04/23/2022    CMP  Lab Results  Component Value Date   NA 133 (L) 04/23/2022   K 3.7 04/23/2022   CL 96 (L) 04/23/2022   CO2 24 04/23/2022   GLUCOSE 101 (H) 04/23/2022   BUN 18 04/23/2022   CREATININE 1.12 (H) 04/23/2022   CALCIUM 8.6 (L) 04/23/2022   PROT 7.1 04/23/2022   ALBUMIN 2.7 (L) 04/23/2022   AST 160 (H) 04/23/2022   ALT 51 (H) 04/23/2022   ALKPHOS 560 (H) 04/23/2022   BILITOT 2.0 (H) 04/23/2022   GFRNONAA 55 (L) 04/23/2022   GFRAA >60 08/10/2020    Lab Results  Component Value Date   CEA1 <1.00 06/27/2017     Medications: I have reviewed the patient's current medications.   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 to the omentum, right fallopian tube, and bilateral ovaries,pT3,pNx, optimal debulking with small peritoneal studding of the diaphragm and a remaining thin rind of tumor at the posterior peritoneum in the pelvis Cycle 1 adjuvant Taxol/carboplatin 08/05/2017 Cycle 2 adjuvant Taxol/carboplatin 08/26/2017 Cycle 3 adjuvant Taxol/carboplatin 09/17/2017-Taxol dose reduced secondary to neuropathy and bone pain  Cycle 4 adjuvant Taxol/carboplatin 10/10/2017 Cycle 5 adjuvant Taxol/carboplatin 10/31/2017 Cycle 6 adjuvant Taxol/carboplatin 11/21/2017 CT abdomen/pelvis 08/08/2019- narrowing of rectosigmoid colon with eccentric serosal implant, 10 mm lymph node at the superior rectal vein, left upper quadrant soft tissue implant, 9 mm precaval node, 15 mm left internal iliac node, no ascites Cycle 1 Taxol/carboplatin 08/31/2019 Cycle 2 Taxol/carboplatin/Avastin 09/21/2019 Cycle 3 Taxol/Carboplatin 10/12/2019 (Avastin discontinued) Cycle 4 Taxol/carboplatin November 02, 2019 CT abdomen/pelvis 11/17/2019-decrease in size of previously prominent pericaval, left internal iliac, and superior rectal vein nodes, resolved rectosigmoid wall thickening Cycle 5 Taxol/carboplatin 11/22/2019 Cycle 6 Taxol/carboplatin 12/13/2019 Olaparib 01/05/2020 CT abdomen/pelvis 07/10/2020-enlarging soft tissue at the distal left ureter, the ureter is not dilated, peritoneal implant adjacent to  the splenic flexure, enlarged left superior rectal node, lymph nodes along the course of the distal IMV have  enlarged, eccentric thickening of the sigmoid colon not seen  Olaraparib discontinued 07/12/2020 Cycle 1 Taxol/carboplatin 07/21/2020 Cycle 2 Taxol/carboplatin 08/10/2020 (carboplatin dose reduced secondary to thrombocytopenia) Cycle 3 Taxol/carboplatin 09/05/2020 Cycle 4 Taxol/carboplatin 09/26/2020 Cycle 5 Taxol/carboplatin 10/24/2020 CT abdomen/pelvis 11/13/2020-overall stable disease, no new site of metastatic disease, generally stable small abdominal/pelvic lymph nodes, slight enlargement of a peritoneal implant in the left pelvis, slight decrease in size of another pelvic implant Cycle 6 Taxol/carboplatin 11/14/2020 Cycle 7 Taxol/carboplatin 12/05/2020 Cycle 8 Taxol/carboplatin 12/26/2020 Cycle 9 Taxol/carboplatin 01/16/2021 Cycle 10 Taxol/carboplatin 02/06/2021 Cycle 11 Taxol/carboplatin 02/27/2021 Cycle 12 Taxol/carboplatin 03/20/2021 Cycle 13 Taxol/carboplatin 04/10/2021 Cycle 14 Taxol/carboplatin 05/08/2021 Cycle 15 Taxol/carboplatin 05/29/2021 Cycle 16 Taxol/carboplatin 06/19/2021 CT abdomen/pelvis 07/06/2021-increased size of a left pelvic soft tissue mass with obstruction of the distal left ureter and new moderate left hydronephrosis, no change in mild pelvic lymphadenopathy Systemic therapy placed on hold CT abdomen/pelvis 10/15/2021-new bilateral pulmonary metastases, new hepatic metastases, mild increase in size of left adnexal mass, mild increase in size of sigmoid mesenteric and left upper quadrant omental nodules CT head 10/15/2021-large lucency in the left parietal skull concerning for a bone metastasis Cycle 1 Doxil 10/23/2021 Cycle 2 Doxil/bevacizumab 11/16/2021 Cycle 3 Doxil/bevacizumab 12/20/2021 Cycle 4 Doxil/bevacizumab 01/15/2022 CT abdomen/pelvis 02/11/2022-increased size and number of liver lesions, new right lung nodule, other lung nodules stable, decrease in left adnexal soft tissue mass and peritoneal nodules, progressive left hydronephrosis Guardant360 02/12/2022-BRCA1-single copy  deletion, T p53, EGFR amplification, tumor mutation burden 17, MSI high-not detected Cycle 1 gemcitabine, day 1, day 8-4/25/2023 CT chest abdomen and pelvis, no IV contrast, 03/18/2022-new and enlarging bilateral pulmonary nodules, multiple hypodense liver metastases, increase in size of left adnexal mass with adjacent soft tissue nodule Cycle 2 gemcitabine 04/02/2022 CT angiogram chest 04/09/2022-negative for pulmonary embolism, pulmonary hepatic metastases, bibasilar subsegmental atelectasis Cycle 1 pembrolizumab 04/12/2022    Depression   3.   Hypothyroid   4.   Family history of uterine cancer-paternal grandmother   21.   Acute onset dyspnea/pleuritic left-sided chest pain 10/02/2019 CT chest 10/04/2019-left lower lobe pulmonary emboli, multifocal pneumonia lower lobes On Xarelto, status post course of Levaquin.  6.  Acute onset left lower back pain 01/24/2021-resolved after a Medrol Dosepak  7.  Mild elevation of the creatinine, renal ultrasound 05/31/2021-mild left hydronephrosis CT 07/06/2021-new moderate left hydronephrosis, left ureter stent placed 07/18/2021 Left ureter metallic stent 6/64/4034 8.  Neutropenia following cycle 1 Doxil-Udenyca added with cycle 2    Disposition: Ms. Bowe has metastatic fallopian tube carcinoma.  She completed 1 treatment with pembrolizumab last week.  She tolerated the pembrolizumab well.  She continues to be symptomatic from the metastatic fallopian tube carcinoma.  She will continue hydrocodone as needed for pain.  The constipation is likely related to carcinomatosis and narcotics.  She will try fleets enema.  She will begin sorbitol.  She will call if the constipation is not relieved over the next 1-2 days.  She will return for an office visit and the next treatment with pembrolizumab on 2022/06/06.  I refilled her prescription for hydrocodone.  We will attempt to test the fallopian tube tumor for folate receptor expression.  [name],  MD  04/23/2022  1:33 PM

## 2022-04-24 LAB — CA 125: Cancer Antigen (CA) 125: 162 U/mL — ABNORMAL HIGH (ref 0.0–38.1)

## 2022-04-30 ENCOUNTER — Other Ambulatory Visit: Payer: BLUE CROSS/BLUE SHIELD

## 2022-04-30 ENCOUNTER — Ambulatory Visit: Payer: BLUE CROSS/BLUE SHIELD

## 2022-04-30 ENCOUNTER — Inpatient Hospital Stay: Payer: Medicare Other

## 2022-05-01 ENCOUNTER — Encounter (HOSPITAL_COMMUNITY): Payer: Self-pay | Admitting: Oncology

## 2022-05-02 IMAGING — CT CT HEAD W/O CM
4 series · 16 of 47 positions shown, 18 images · non-contrast
Comparison: None.

CLINICAL DATA: Right occipital skull pain. History of left
fallopian tube malignancy.

EXAM:
CT HEAD WITHOUT CONTRAST
TECHNIQUE: Contiguous axial images were obtained from the base of the skull
through the vertex without intravenous contrast.

[Series 2: head wo · axial · 0.42mm/px · z∈[-15,+95]mm · 7 of 30 slices shown, 9 images]
[im 4/30  brain]
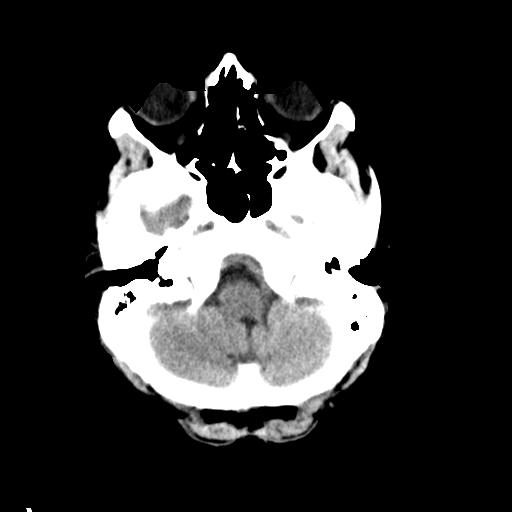
[im 4/30  bone]
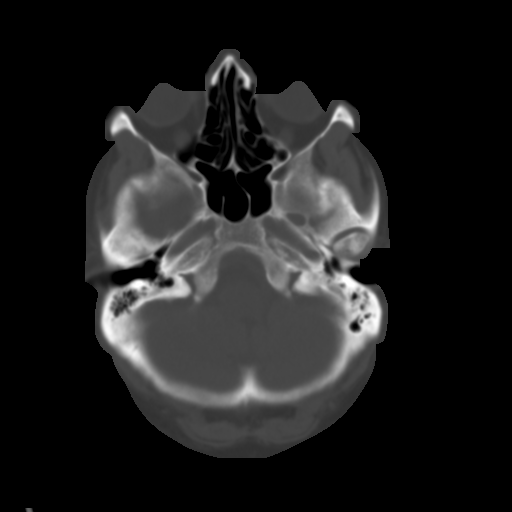
[im 8/30  brain]
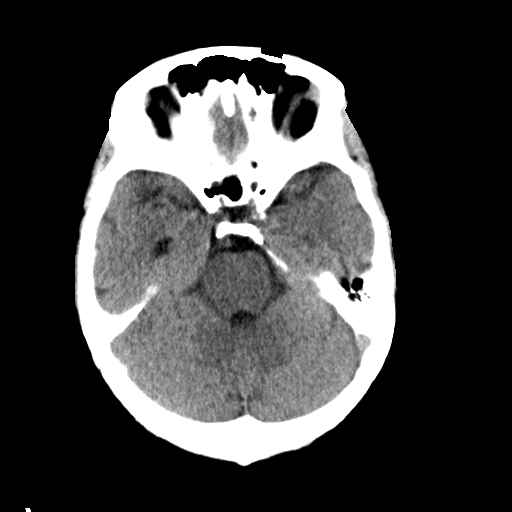
[im 11/30  brain]
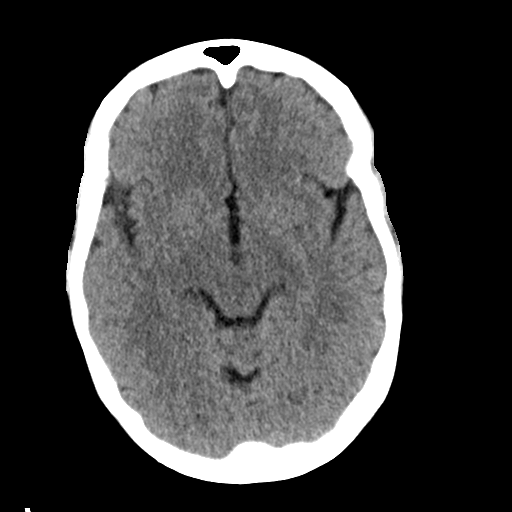
[im 15/30  brain]
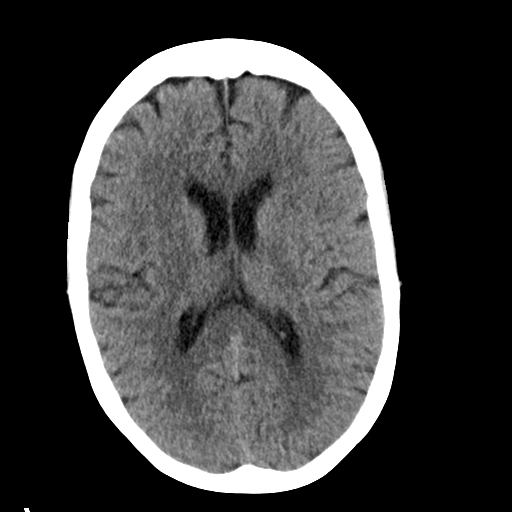
[im 19/30  brain]
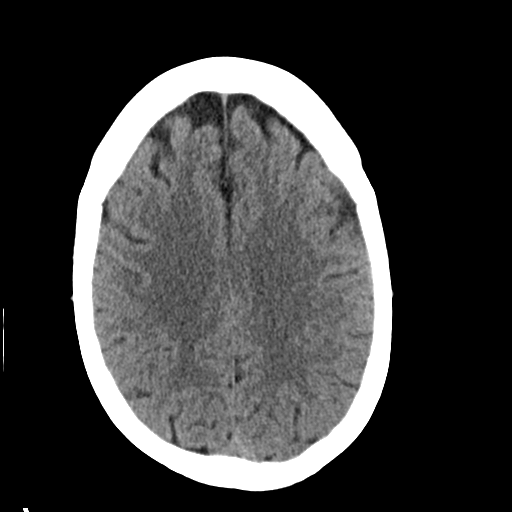
[im 19/30  bone]
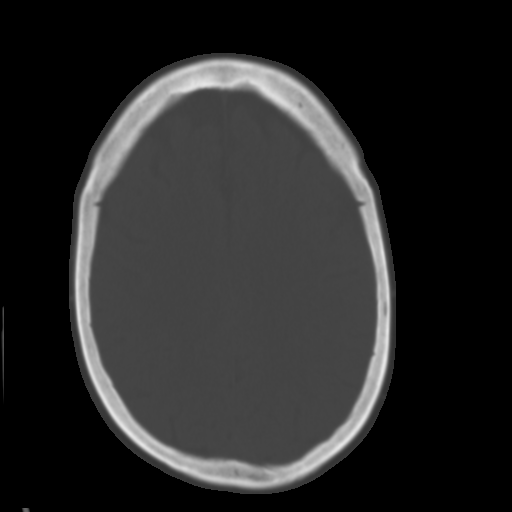
[im 22/30  brain]
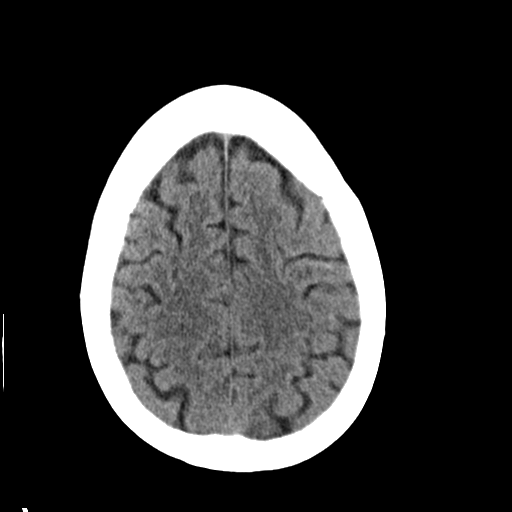
[im 26/30  brain]
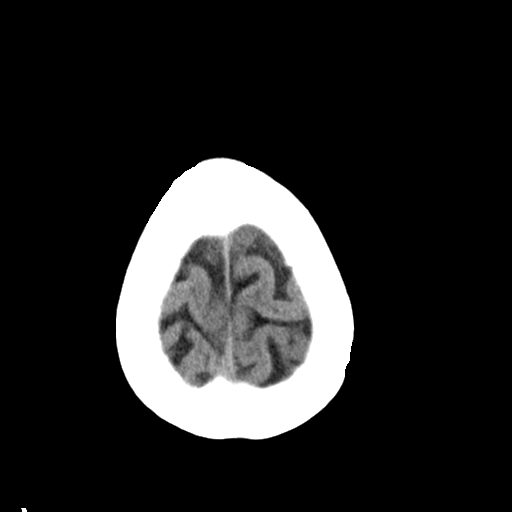

[Series 3: head bone · axial · 0.42mm/px · z∈[-16,+12]mm · 3 of 74 slices shown]
[im 8/74  bone]
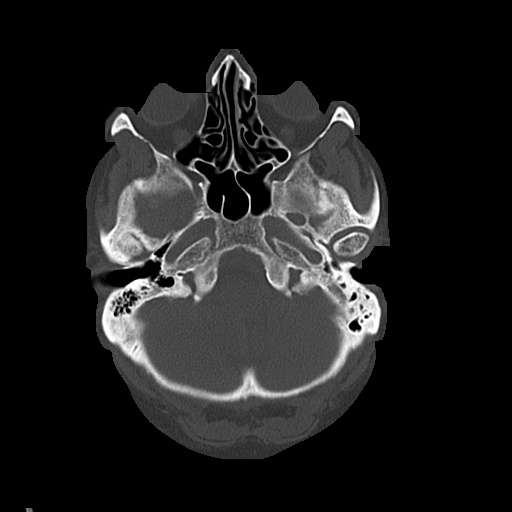
[im 15/74  bone]
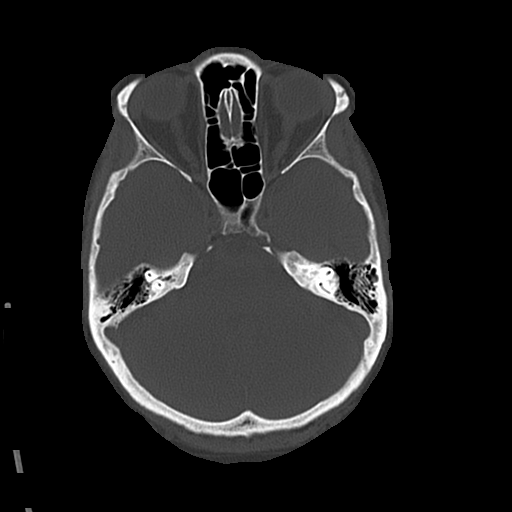
[im 22/74  bone]
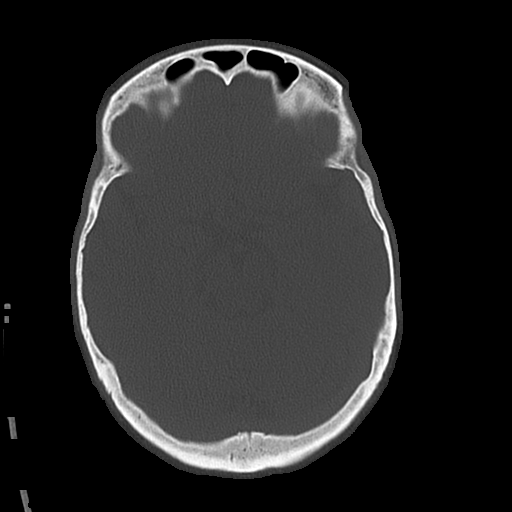

[Series 4: coronal soft · coronal · 0.30mm/px · 3 of 67 slices shown]
[im 23/67  brain]
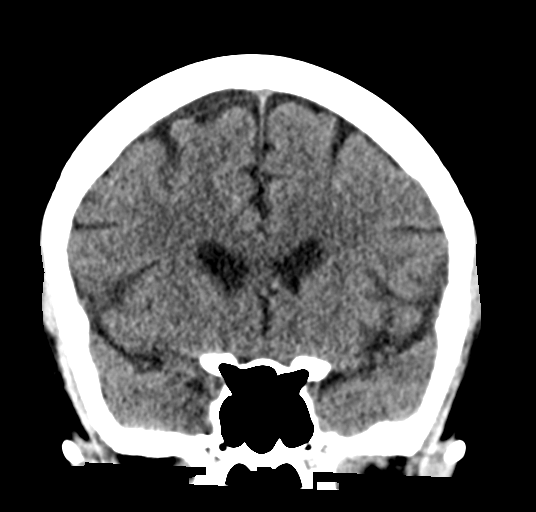
[im 30/67  brain]
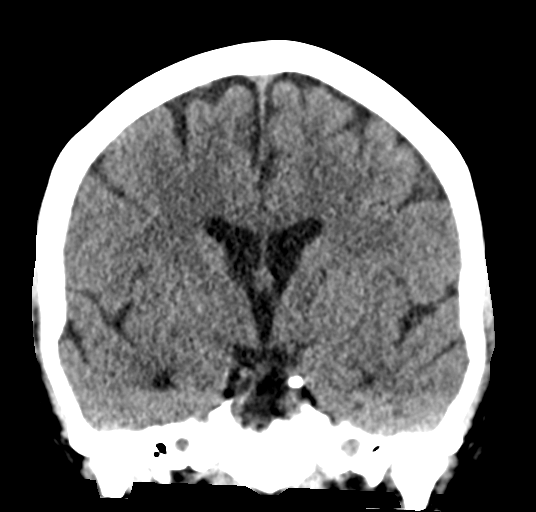
[im 37/67  brain]
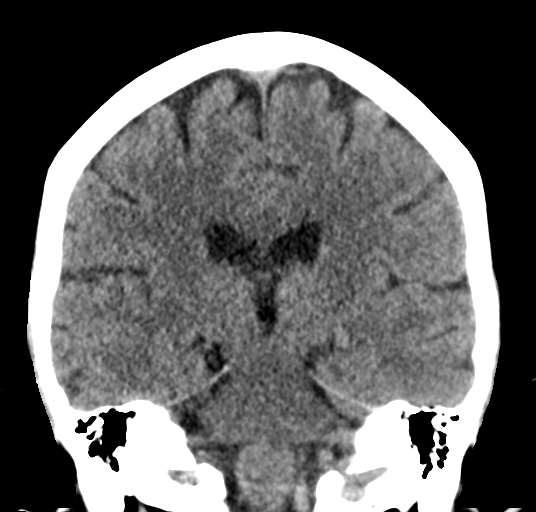

[Series 5: sagittal soft · sagittal · 0.30mm/px · 3 of 54 slices shown]
[im 18/54  brain]
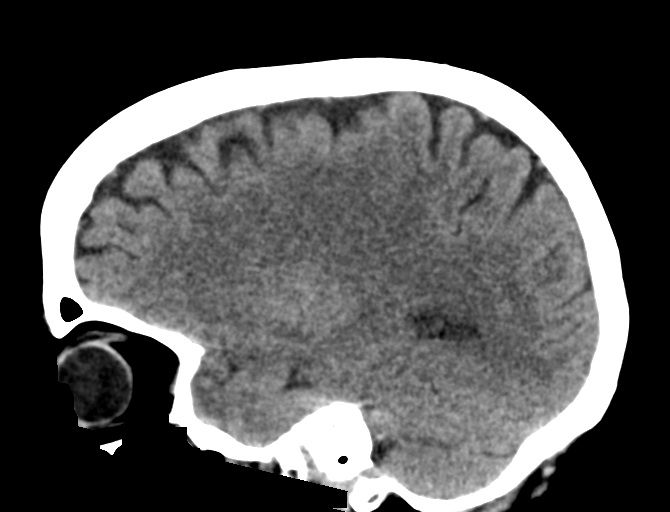
[im 27/54  brain]
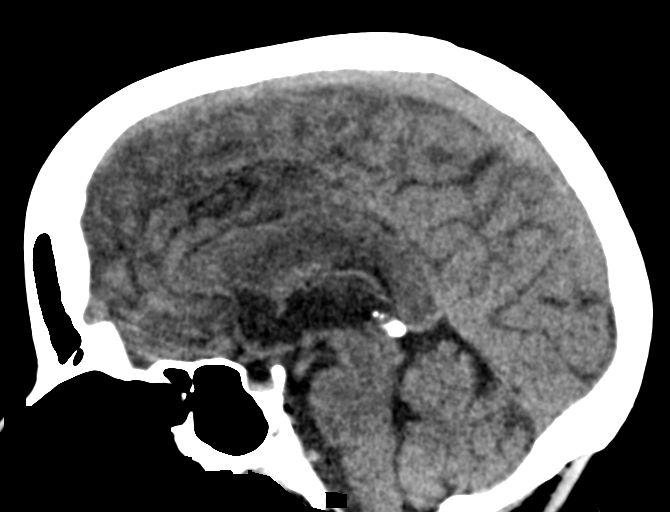
[im 36/54  brain]
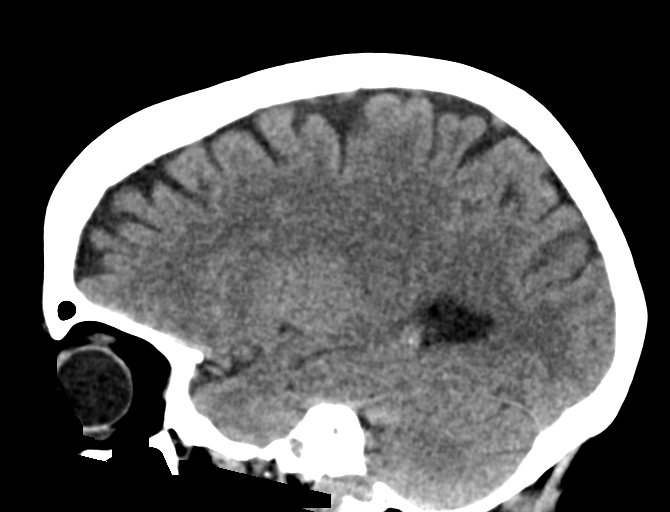

[16 of 47 positions shown; findings below may reference images not displayed]

FINDINGS: Brain: There is no evidence of an acute infarct, intracranial
hemorrhage, mass, midline shift, or extra-axial fluid collection.
The ventricles and sulci are normal.

Vascular: Calcified atherosclerosis at the skull base. No hyperdense
vessel.

Skull: No fracture. Abnormal, asymmetric lucency involving the left
parietal skull over an area spanning 6.5-7 cm without gross cortical
disruption.

Sinuses/Orbits: Visualized paranasal sinuses and mastoid air cells
are clear. Bilateral cataract extraction.

Other: None.
IMPRESSION: 1. Large lucency in the left parietal skull, indeterminate however
osseous metastatic disease is a concern. Consider further evaluation
with brain MRI without and with contrast or nuclear medicine
whole-body bone scan.
2. Unremarkable CT appearance of the brain.

## 2022-05-02 IMAGING — CT CT ABD-PELV W/ CM
2 of 5 series · 15 of 46 positions shown, 17 images · IV contrast (omnipaque)
Comparison: AP CT on 07/06/2021, and chest CTA on 01/12/2020

CLINICAL DATA: Left fallopian tube carcinoma. Currently undergoing
chemotherapy. Restaging.

EXAM:
CT ABDOMEN AND PELVIS WITH CONTRAST
TECHNIQUE: Multidetector CT imaging of the abdomen and pelvis was performed
using the standard protocol following bolus administration of
intravenous contrast.
CONTRAST:  80mL OMNIPAQUE IOHEXOL 300 MG/ML  SOLN

[Series 2: abd pel w · axial · 0.75mm/px · z∈[-735,-325]mm · 12 of 92 slices shown, 14 images]
[im 5/92  soft-tissue]
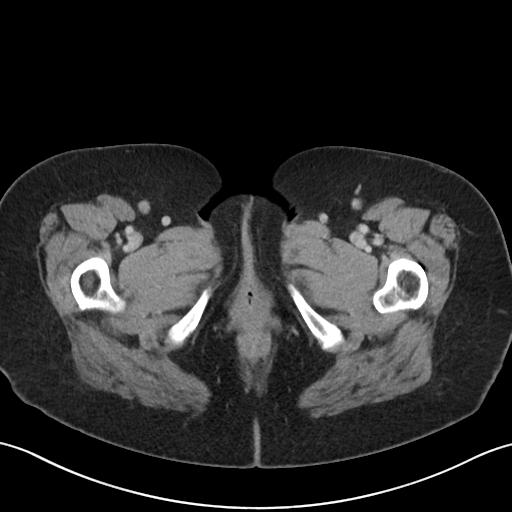
[im 5/92  bone]
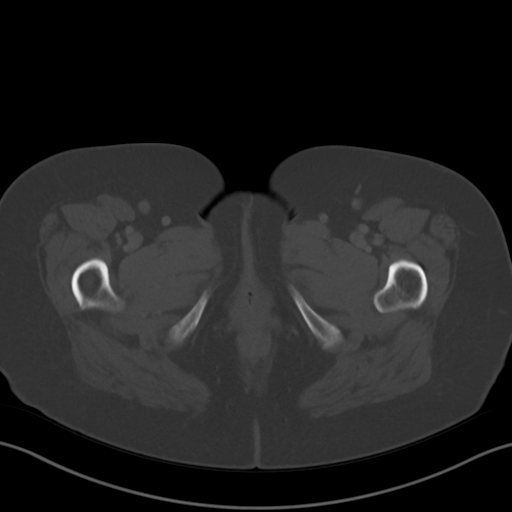
[im 15/92  soft-tissue]
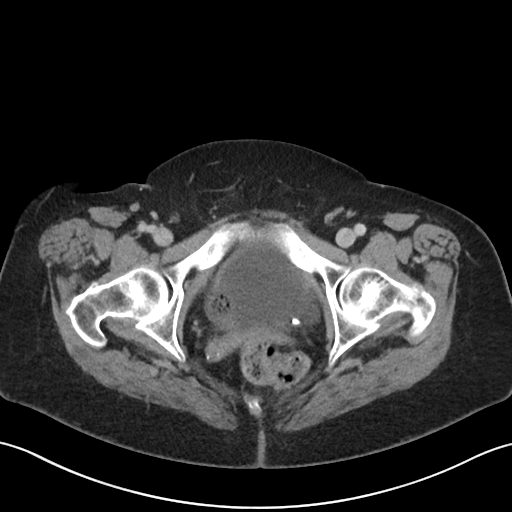
[im 20/92  soft-tissue]
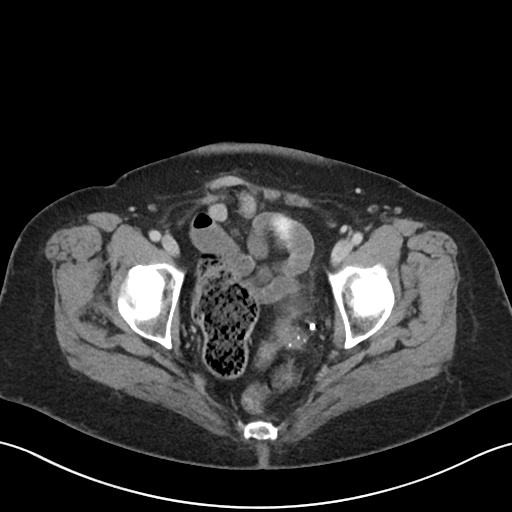
[im 29/92  soft-tissue]
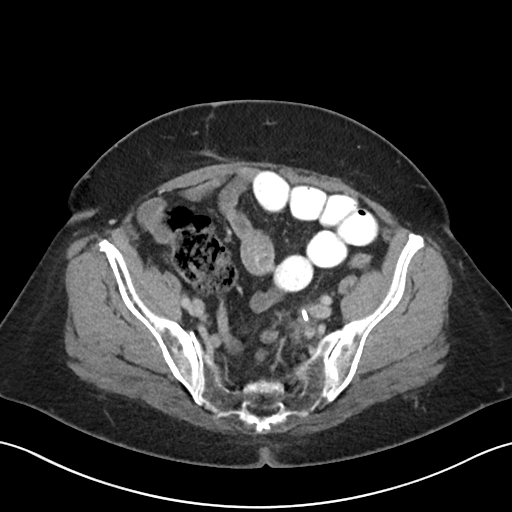
[im 34/92  soft-tissue]
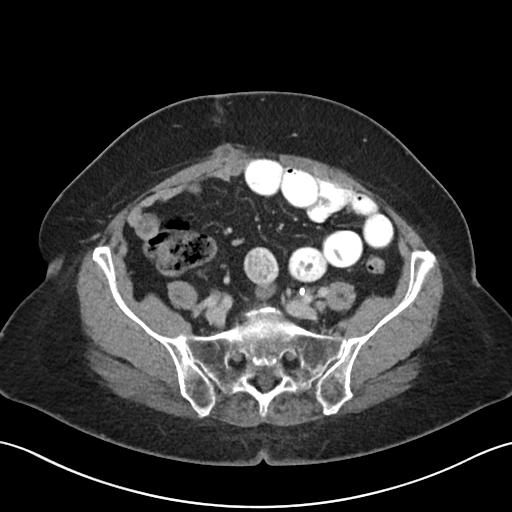
[im 44/92  soft-tissue]
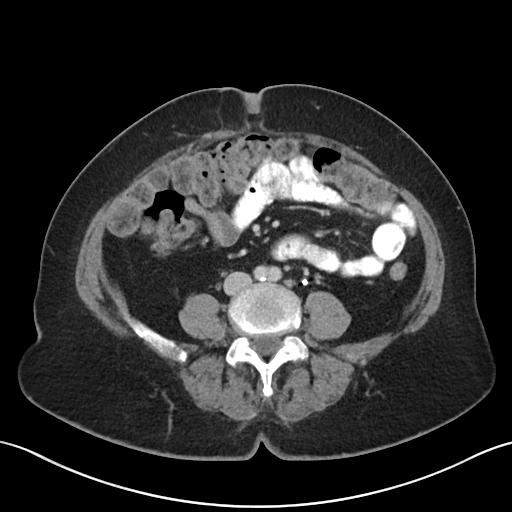
[im 48/92  soft-tissue]
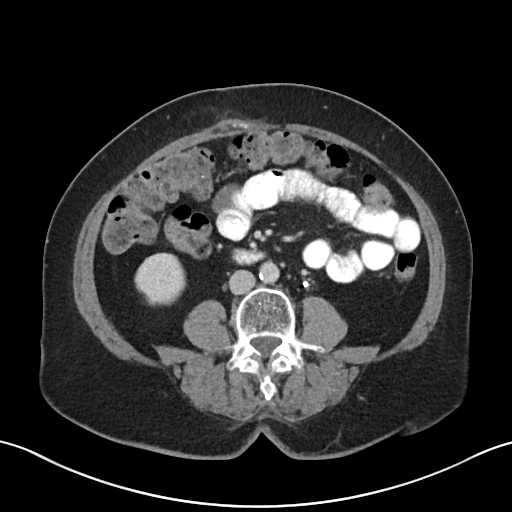
[im 58/92  soft-tissue]
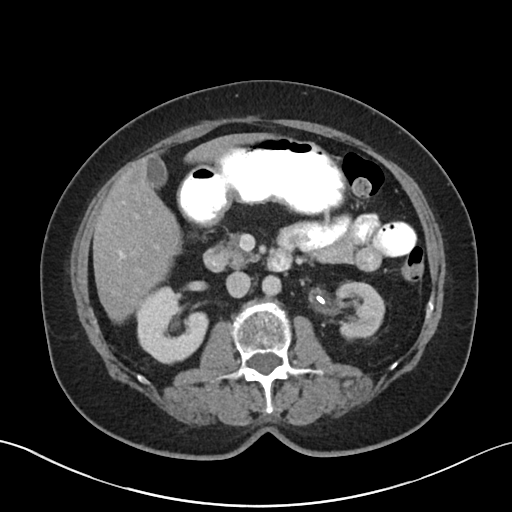
[im 63/92  soft-tissue]
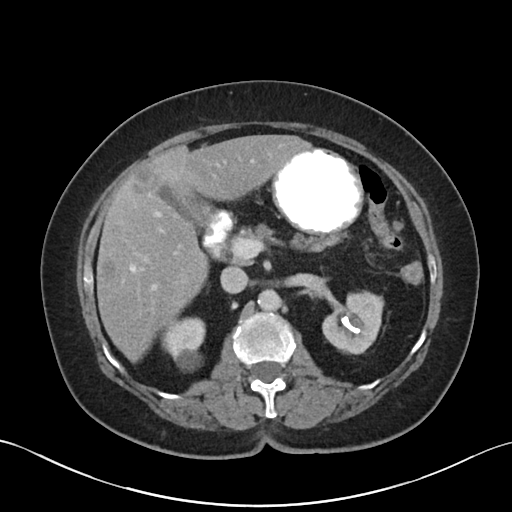
[im 63/92  bone]
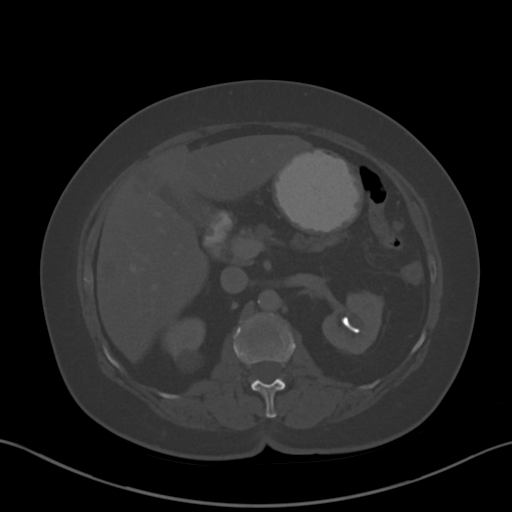
[im 72/92  soft-tissue]
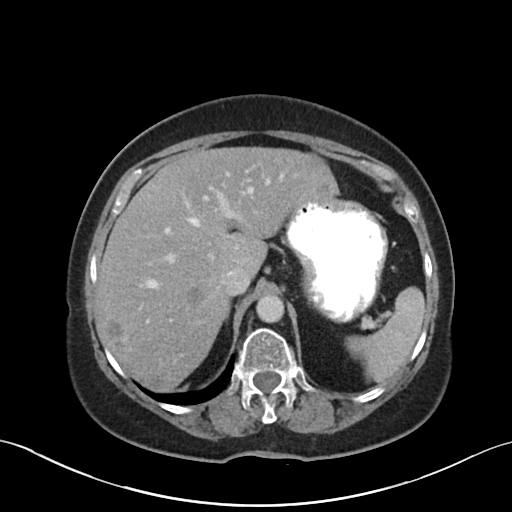
[im 77/92  soft-tissue]
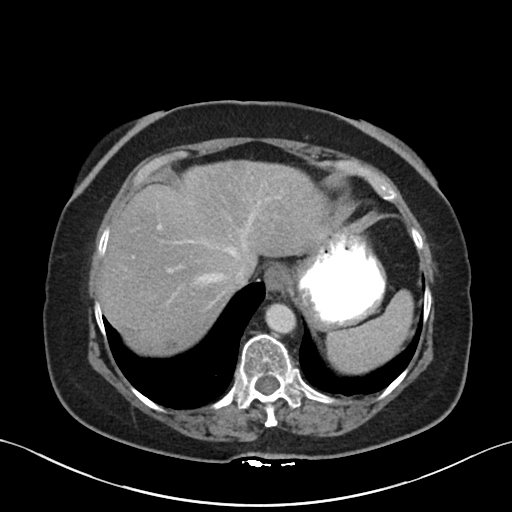
[im 87/92  soft-tissue]
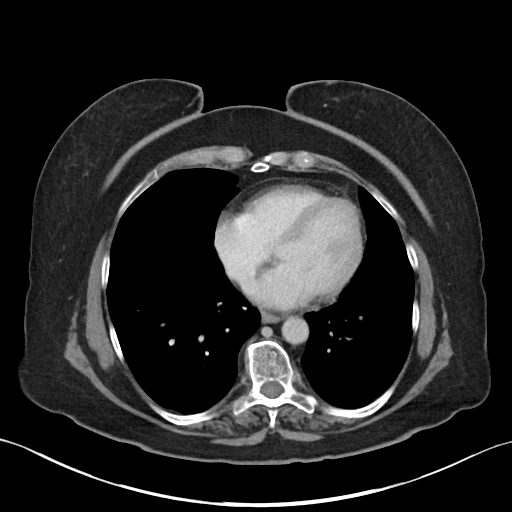

[Series 5: coronal · coronal · 0.81mm/px · 3 of 107 slices shown]
[im 36/107  soft-tissue]
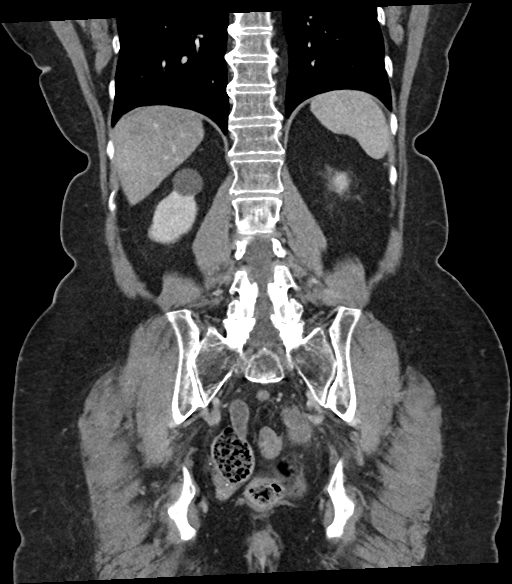
[im 48/107  soft-tissue]
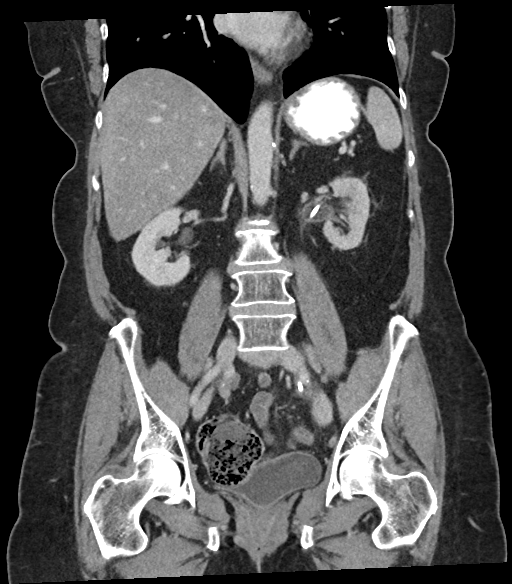
[im 59/107  soft-tissue]
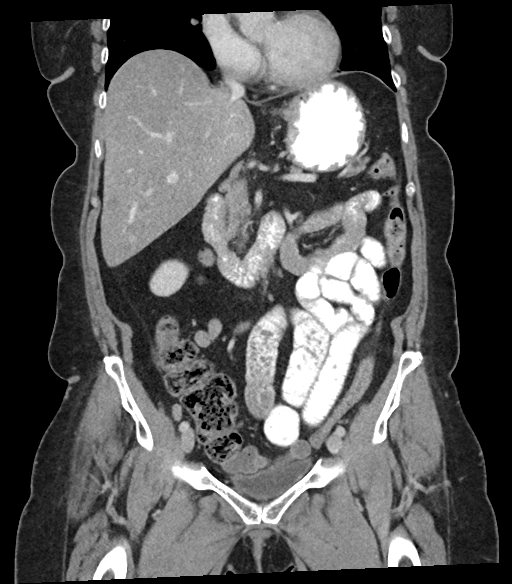

[15 of 46 positions shown; findings below may reference images not displayed]

FINDINGS: Lower Chest: 8 mm noncalcified pulmonary nodule is seen in the
medial right middle lobe on image [DATE], and 9 mm pulmonary nodule is
seen in the inferior right middle lobe on image [DATE]. Several smaller
pulmonary nodules are seen in the posterior lower lobes bilaterally.
These are new compared to prior studies and consistent with
pulmonary metastases.

Hepatobiliary: Multiple new hypovascular masses are seen in the
right and left hepatic lobes, largest adjacent to the gallbladder
fundus measuring 2.5 x 2.0 cm on image [DATE]. These are consistent
with hepatic metastases. Mild hepatic steatosis again noted.
Gallbladder is unremarkable. No evidence of biliary ductal
dilatation.

Pancreas:  No mass or inflammatory changes.

Spleen: Within normal limits in size and appearance.

Adrenals/Urinary Tract: Right upper pole renal cyst again noted. No
adrenal or renal masses identified. There has been placement of a
left ureteral stent since prior study, with resolution of left
hydroureteronephrosis.

Stomach/Bowel: No evidence of obstruction, inflammatory process or
abnormal fluid collections.

Vascular/Lymphatic: Several soft tissue nodules are seen in the
sigmoid mesentery, largest measuring 1.0 cm short axis, which are
increased in size since previous study. These may represent lymph
nodes or peritoneal metastases. A 9 mm soft tissue nodule is seen in
the left upper quadrant omental fat on image [DATE], which was not
seen on previous study, suspicious for peritoneal metastasis. Aortic
atherosclerotic calcification noted. No acute vascular findings.

Reproductive: Prior hysterectomy. Soft tissue mass in the left
adnexa which involves the distal left ureter shows mild increase in
size, currently measuring 3.6 x 2.9 cm on image 68/2, compared to
2.7 x 2.3 cm previously. No evidence of ascites.

Other: Small suprapubic ventral hernia again seen containing a small
bowel loop. No evidence of bowel obstruction or strangulation.

Musculoskeletal:  No suspicious bone lesions identified.
IMPRESSION: New bibasilar pulmonary metastases.

New hepatic metastatic disease.

Mild increase in size of left adnexal mass, which involves the
distal left ureter. Resolution of left hydronephrosis following
ureteral stent placement.

Mild increase in size of sigmoid mesenteric and left upper quadrant
omental soft tissue nodules or lymph nodes, consistent with
metastatic disease.

Aortic Atherosclerosis ([id number]-IG1.1).

## 2022-05-04 ENCOUNTER — Inpatient Hospital Stay (HOSPITAL_COMMUNITY)
Admission: EM | Admit: 2022-05-04 | Discharge: 2022-05-18 | DRG: 682 | Disposition: E | Payer: Medicare Other | Attending: Internal Medicine | Admitting: Internal Medicine

## 2022-05-04 ENCOUNTER — Other Ambulatory Visit: Payer: Self-pay

## 2022-05-04 ENCOUNTER — Encounter (HOSPITAL_COMMUNITY): Payer: Self-pay

## 2022-05-04 ENCOUNTER — Emergency Department (HOSPITAL_COMMUNITY): Payer: Medicare Other

## 2022-05-04 DIAGNOSIS — R188 Other ascites: Secondary | ICD-10-CM | POA: Diagnosis present

## 2022-05-04 DIAGNOSIS — Z515 Encounter for palliative care: Secondary | ICD-10-CM

## 2022-05-04 DIAGNOSIS — E8809 Other disorders of plasma-protein metabolism, not elsewhere classified: Secondary | ICD-10-CM | POA: Diagnosis present

## 2022-05-04 DIAGNOSIS — C569 Malignant neoplasm of unspecified ovary: Principal | ICD-10-CM

## 2022-05-04 DIAGNOSIS — E7800 Pure hypercholesterolemia, unspecified: Secondary | ICD-10-CM | POA: Diagnosis present

## 2022-05-04 DIAGNOSIS — R778 Other specified abnormalities of plasma proteins: Secondary | ICD-10-CM | POA: Diagnosis not present

## 2022-05-04 DIAGNOSIS — Z66 Do not resuscitate: Secondary | ICD-10-CM | POA: Diagnosis present

## 2022-05-04 DIAGNOSIS — F32A Depression, unspecified: Secondary | ICD-10-CM | POA: Diagnosis present

## 2022-05-04 DIAGNOSIS — C7801 Secondary malignant neoplasm of right lung: Secondary | ICD-10-CM | POA: Diagnosis present

## 2022-05-04 DIAGNOSIS — R110 Nausea: Secondary | ICD-10-CM | POA: Diagnosis not present

## 2022-05-04 DIAGNOSIS — Z95828 Presence of other vascular implants and grafts: Secondary | ICD-10-CM

## 2022-05-04 DIAGNOSIS — K219 Gastro-esophageal reflux disease without esophagitis: Secondary | ICD-10-CM | POA: Diagnosis not present

## 2022-05-04 DIAGNOSIS — K7200 Acute and subacute hepatic failure without coma: Secondary | ICD-10-CM | POA: Diagnosis present

## 2022-05-04 DIAGNOSIS — E872 Acidosis, unspecified: Secondary | ICD-10-CM

## 2022-05-04 DIAGNOSIS — C5700 Malignant neoplasm of unspecified fallopian tube: Secondary | ICD-10-CM | POA: Diagnosis not present

## 2022-05-04 DIAGNOSIS — E871 Hypo-osmolality and hyponatremia: Secondary | ICD-10-CM | POA: Diagnosis present

## 2022-05-04 DIAGNOSIS — R1010 Upper abdominal pain, unspecified: Secondary | ICD-10-CM

## 2022-05-04 DIAGNOSIS — C786 Secondary malignant neoplasm of retroperitoneum and peritoneum: Secondary | ICD-10-CM | POA: Diagnosis present

## 2022-05-04 DIAGNOSIS — D701 Agranulocytosis secondary to cancer chemotherapy: Secondary | ICD-10-CM | POA: Diagnosis present

## 2022-05-04 DIAGNOSIS — Z8349 Family history of other endocrine, nutritional and metabolic diseases: Secondary | ICD-10-CM

## 2022-05-04 DIAGNOSIS — I129 Hypertensive chronic kidney disease with stage 1 through stage 4 chronic kidney disease, or unspecified chronic kidney disease: Secondary | ICD-10-CM | POA: Diagnosis present

## 2022-05-04 DIAGNOSIS — K760 Fatty (change of) liver, not elsewhere classified: Secondary | ICD-10-CM | POA: Diagnosis present

## 2022-05-04 DIAGNOSIS — Z6828 Body mass index (BMI) 28.0-28.9, adult: Secondary | ICD-10-CM

## 2022-05-04 DIAGNOSIS — I10 Essential (primary) hypertension: Secondary | ICD-10-CM | POA: Diagnosis not present

## 2022-05-04 DIAGNOSIS — Z79890 Hormone replacement therapy (postmenopausal): Secondary | ICD-10-CM

## 2022-05-04 DIAGNOSIS — B9689 Other specified bacterial agents as the cause of diseases classified elsewhere: Secondary | ICD-10-CM | POA: Diagnosis present

## 2022-05-04 DIAGNOSIS — G4733 Obstructive sleep apnea (adult) (pediatric): Secondary | ICD-10-CM | POA: Diagnosis present

## 2022-05-04 DIAGNOSIS — Z7189 Other specified counseling: Secondary | ICD-10-CM

## 2022-05-04 DIAGNOSIS — C787 Secondary malignant neoplasm of liver and intrahepatic bile duct: Secondary | ICD-10-CM | POA: Diagnosis present

## 2022-05-04 DIAGNOSIS — Z86711 Personal history of pulmonary embolism: Secondary | ICD-10-CM | POA: Diagnosis present

## 2022-05-04 DIAGNOSIS — E669 Obesity, unspecified: Secondary | ICD-10-CM | POA: Diagnosis present

## 2022-05-04 DIAGNOSIS — N136 Pyonephrosis: Secondary | ICD-10-CM | POA: Diagnosis present

## 2022-05-04 DIAGNOSIS — Z803 Family history of malignant neoplasm of breast: Secondary | ICD-10-CM

## 2022-05-04 DIAGNOSIS — N179 Acute kidney failure, unspecified: Secondary | ICD-10-CM | POA: Diagnosis present

## 2022-05-04 DIAGNOSIS — Z79899 Other long term (current) drug therapy: Secondary | ICD-10-CM

## 2022-05-04 DIAGNOSIS — N1831 Chronic kidney disease, stage 3a: Secondary | ICD-10-CM | POA: Diagnosis present

## 2022-05-04 DIAGNOSIS — Z833 Family history of diabetes mellitus: Secondary | ICD-10-CM

## 2022-05-04 DIAGNOSIS — Z8049 Family history of malignant neoplasm of other genital organs: Secondary | ICD-10-CM

## 2022-05-04 DIAGNOSIS — I248 Other forms of acute ischemic heart disease: Secondary | ICD-10-CM | POA: Diagnosis present

## 2022-05-04 DIAGNOSIS — C7802 Secondary malignant neoplasm of left lung: Secondary | ICD-10-CM | POA: Diagnosis present

## 2022-05-04 DIAGNOSIS — R627 Adult failure to thrive: Secondary | ICD-10-CM | POA: Diagnosis present

## 2022-05-04 DIAGNOSIS — G893 Neoplasm related pain (acute) (chronic): Secondary | ICD-10-CM | POA: Diagnosis present

## 2022-05-04 DIAGNOSIS — Z7901 Long term (current) use of anticoagulants: Secondary | ICD-10-CM

## 2022-05-04 DIAGNOSIS — R112 Nausea with vomiting, unspecified: Secondary | ICD-10-CM

## 2022-05-04 DIAGNOSIS — Z823 Family history of stroke: Secondary | ICD-10-CM

## 2022-05-04 DIAGNOSIS — N390 Urinary tract infection, site not specified: Secondary | ICD-10-CM | POA: Diagnosis not present

## 2022-05-04 DIAGNOSIS — Z8261 Family history of arthritis: Secondary | ICD-10-CM

## 2022-05-04 DIAGNOSIS — R0602 Shortness of breath: Secondary | ICD-10-CM | POA: Diagnosis present

## 2022-05-04 DIAGNOSIS — Z91030 Bee allergy status: Secondary | ICD-10-CM

## 2022-05-04 DIAGNOSIS — Z20822 Contact with and (suspected) exposure to covid-19: Secondary | ICD-10-CM | POA: Diagnosis present

## 2022-05-04 DIAGNOSIS — Z8543 Personal history of malignant neoplasm of ovary: Secondary | ICD-10-CM

## 2022-05-04 DIAGNOSIS — E869 Volume depletion, unspecified: Secondary | ICD-10-CM | POA: Diagnosis present

## 2022-05-04 DIAGNOSIS — E860 Dehydration: Secondary | ICD-10-CM | POA: Diagnosis present

## 2022-05-04 DIAGNOSIS — Z820 Family history of epilepsy and other diseases of the nervous system: Secondary | ICD-10-CM

## 2022-05-04 DIAGNOSIS — R7989 Other specified abnormal findings of blood chemistry: Secondary | ICD-10-CM | POA: Diagnosis present

## 2022-05-04 DIAGNOSIS — I959 Hypotension, unspecified: Secondary | ICD-10-CM | POA: Diagnosis not present

## 2022-05-04 DIAGNOSIS — E162 Hypoglycemia, unspecified: Secondary | ICD-10-CM | POA: Diagnosis present

## 2022-05-04 DIAGNOSIS — Z91038 Other insect allergy status: Secondary | ICD-10-CM

## 2022-05-04 DIAGNOSIS — E039 Hypothyroidism, unspecified: Secondary | ICD-10-CM | POA: Diagnosis present

## 2022-05-04 DIAGNOSIS — Z8249 Family history of ischemic heart disease and other diseases of the circulatory system: Secondary | ICD-10-CM

## 2022-05-04 LAB — CBC WITH DIFFERENTIAL/PLATELET
Abs Immature Granulocytes: 0.15 10*3/uL — ABNORMAL HIGH (ref 0.00–0.07)
Basophils Absolute: 0 10*3/uL (ref 0.0–0.1)
Basophils Relative: 0 %
Eosinophils Absolute: 0 10*3/uL (ref 0.0–0.5)
Eosinophils Relative: 0 %
HCT: 31.2 % — ABNORMAL LOW (ref 36.0–46.0)
Hemoglobin: 9.8 g/dL — ABNORMAL LOW (ref 12.0–15.0)
Immature Granulocytes: 1 %
Lymphocytes Relative: 10 %
Lymphs Abs: 1.1 10*3/uL (ref 0.7–4.0)
MCH: 33 pg (ref 26.0–34.0)
MCHC: 31.4 g/dL (ref 30.0–36.0)
MCV: 105.1 fL — ABNORMAL HIGH (ref 80.0–100.0)
Monocytes Absolute: 1.1 10*3/uL — ABNORMAL HIGH (ref 0.1–1.0)
Monocytes Relative: 10 %
Neutro Abs: 8.5 10*3/uL — ABNORMAL HIGH (ref 1.7–7.7)
Neutrophils Relative %: 79 %
Platelets: 134 10*3/uL — ABNORMAL LOW (ref 150–400)
RBC: 2.97 MIL/uL — ABNORMAL LOW (ref 3.87–5.11)
RDW: 26.5 % — ABNORMAL HIGH (ref 11.5–15.5)
WBC: 10.9 10*3/uL — ABNORMAL HIGH (ref 4.0–10.5)
nRBC: 4.9 % — ABNORMAL HIGH (ref 0.0–0.2)

## 2022-05-04 LAB — PROTIME-INR
INR: 4 — ABNORMAL HIGH (ref 0.8–1.2)
Prothrombin Time: 38.4 seconds — ABNORMAL HIGH (ref 11.4–15.2)

## 2022-05-04 LAB — AMMONIA: Ammonia: 50 umol/L — ABNORMAL HIGH (ref 9–35)

## 2022-05-04 LAB — URINALYSIS, ROUTINE W REFLEX MICROSCOPIC
Bacteria, UA: NONE SEEN
Glucose, UA: NEGATIVE mg/dL
Ketones, ur: NEGATIVE mg/dL
Nitrite: NEGATIVE
Protein, ur: 30 mg/dL — AB
RBC / HPF: >50 RBC/hpf — ABNORMAL HIGH (ref 0–5)
Specific Gravity, Urine: >1.046 — ABNORMAL HIGH (ref 1.005–1.030)
WBC, UA: >50 WBC/hpf — ABNORMAL HIGH (ref 0–5)
pH: 5 (ref 5.0–8.0)

## 2022-05-04 LAB — BRAIN NATRIURETIC PEPTIDE: B Natriuretic Peptide: 200 pg/mL — ABNORMAL HIGH (ref 0.0–100.0)

## 2022-05-04 LAB — TROPONIN I (HIGH SENSITIVITY)
Troponin I (High Sensitivity): 48 ng/L — ABNORMAL HIGH (ref <18)
Troponin I (High Sensitivity): 37 ng/L — ABNORMAL HIGH (ref <18)

## 2022-05-04 LAB — COMPREHENSIVE METABOLIC PANEL
ALT: 146 U/L — ABNORMAL HIGH (ref 0–44)
AST: 523 U/L — ABNORMAL HIGH (ref 15–41)
Albumin: 1.9 g/dL — ABNORMAL LOW (ref 3.5–5.0)
Alkaline Phosphatase: 612 U/L — ABNORMAL HIGH (ref 38–126)
Anion gap: 17 — ABNORMAL HIGH (ref 5–15)
BUN: 34 mg/dL — ABNORMAL HIGH (ref 8–23)
CO2: 17 mmol/L — ABNORMAL LOW (ref 22–32)
Calcium: 8 mg/dL — ABNORMAL LOW (ref 8.9–10.3)
Chloride: 96 mmol/L — ABNORMAL LOW (ref 98–111)
Creatinine, Ser: 1.64 mg/dL — ABNORMAL HIGH (ref 0.44–1.00)
GFR, Estimated: 35 mL/min — ABNORMAL LOW (ref >60)
Glucose, Bld: 55 mg/dL — ABNORMAL LOW (ref 70–99)
Potassium: 4.6 mmol/L (ref 3.5–5.1)
Sodium: 130 mmol/L — ABNORMAL LOW (ref 135–145)
Total Bilirubin: 7.3 mg/dL — ABNORMAL HIGH (ref 0.3–1.2)
Total Protein: 6.6 g/dL (ref 6.5–8.1)

## 2022-05-04 LAB — LACTIC ACID, PLASMA
Lactic Acid, Venous: 8.3 mmol/L (ref 0.5–1.9)
Lactic Acid, Venous: 7.3 mmol/L (ref 0.5–1.9)
Lactic Acid, Venous: 5.8 mmol/L (ref 0.5–1.9)

## 2022-05-04 LAB — PROCALCITONIN: Procalcitonin: 2.78 ng/mL

## 2022-05-04 MED ORDER — PANTOPRAZOLE SODIUM 40 MG PO TBEC
40.0000 mg | DELAYED_RELEASE_TABLET | Freq: Every day | ORAL | Status: DC
  Administered 2022-05-05 – 2022-05-06 (×2): 40 mg via ORAL
  Filled 2022-05-04 (×2): qty 1

## 2022-05-04 MED ORDER — SODIUM CHLORIDE 0.9 % IV BOLUS
1000.0000 mL | Freq: Once | INTRAVENOUS | Status: AC
  Administered 2022-05-04: 1000 mL via INTRAVENOUS

## 2022-05-04 MED ORDER — ACETAMINOPHEN 650 MG RE SUPP: 325.0000 mg | Freq: Four times a day (QID) | RECTAL | Status: DC | PRN

## 2022-05-04 MED ORDER — ACETAMINOPHEN 325 MG PO TABS: 325.0000 mg | ORAL_TABLET | Freq: Four times a day (QID) | ORAL | Status: DC | PRN

## 2022-05-04 MED ORDER — POLYETHYLENE GLYCOL 3350 17 G PO PACK: 17.0000 g | PACK | Freq: Every day | ORAL | Status: DC | PRN

## 2022-05-04 MED ORDER — SODIUM CHLORIDE 0.9 % IV SOLN
INTRAVENOUS | Status: AC
Start: 2022-05-04 — End: 2022-05-05

## 2022-05-04 MED ORDER — ACETAMINOPHEN 650 MG RE SUPP: 650.0000 mg | Freq: Four times a day (QID) | RECTAL | Status: DC | PRN

## 2022-05-04 MED ORDER — FENTANYL CITRATE PF 50 MCG/ML IJ SOSY
25.0000 ug | PREFILLED_SYRINGE | Freq: Once | INTRAMUSCULAR | Status: AC
  Administered 2022-05-04: 25 ug via INTRAVENOUS
  Filled 2022-05-04: qty 1

## 2022-05-04 MED ORDER — ONDANSETRON HCL 4 MG/2ML IJ SOLN
4.0000 mg | Freq: Four times a day (QID) | INTRAMUSCULAR | Status: DC | PRN
  Administered 2022-05-05 (×4): 4 mg via INTRAVENOUS
  Filled 2022-05-04 (×4): qty 2

## 2022-05-04 MED ORDER — HYDRALAZINE HCL 20 MG/ML IJ SOLN: 10.0000 mg | Freq: Four times a day (QID) | INTRAMUSCULAR | Status: DC | PRN

## 2022-05-04 MED ORDER — ACETAMINOPHEN 325 MG PO TABS: 650.0000 mg | ORAL_TABLET | Freq: Four times a day (QID) | ORAL | Status: DC | PRN

## 2022-05-04 MED ORDER — ONDANSETRON HCL 4 MG PO TABS: 4.0000 mg | ORAL_TABLET | Freq: Four times a day (QID) | ORAL | Status: DC | PRN

## 2022-05-04 MED ORDER — SODIUM CHLORIDE (PF) 0.9 % IJ SOLN
INTRAMUSCULAR | Status: AC
  Filled 2022-05-04: qty 50

## 2022-05-04 MED ORDER — BUPROPION HCL ER (XL) 300 MG PO TB24
300.0000 mg | ORAL_TABLET | Freq: Every day | ORAL | Status: DC
Start: 2022-05-05 — End: 2022-05-06
  Administered 2022-05-05 – 2022-05-06 (×2): 300 mg via ORAL
  Filled 2022-05-04 (×2): qty 1

## 2022-05-04 MED ORDER — IOHEXOL 350 MG/ML SOLN
100.0000 mL | Freq: Once | INTRAVENOUS | Status: AC | PRN
  Administered 2022-05-04: 100 mL via INTRAVENOUS

## 2022-05-04 MED ORDER — HYDROCODONE-ACETAMINOPHEN 5-325 MG PO TABS
1.0000 | ORAL_TABLET | ORAL | Status: DC | PRN
  Administered 2022-05-05 – 2022-05-06 (×3): 1 via ORAL
  Filled 2022-05-04 (×3): qty 1

## 2022-05-04 MED ORDER — PIPERACILLIN-TAZOBACTAM 3.375 G IVPB 30 MIN
3.3750 g | Freq: Once | INTRAVENOUS | Status: AC
  Administered 2022-05-04: 3.375 g via INTRAVENOUS
  Filled 2022-05-04: qty 50

## 2022-05-04 MED ORDER — ONDANSETRON HCL 4 MG/2ML IJ SOLN
4.0000 mg | Freq: Once | INTRAMUSCULAR | Status: AC
  Administered 2022-05-04: 4 mg via INTRAVENOUS
  Filled 2022-05-04: qty 2

## 2022-05-04 MED ORDER — PIPERACILLIN-TAZOBACTAM 3.375 G IVPB
3.3750 g | Freq: Three times a day (TID) | INTRAVENOUS | Status: DC
  Administered 2022-05-05 – 2022-05-06 (×4): 3.375 g via INTRAVENOUS
  Filled 2022-05-04 (×4): qty 50

## 2022-05-04 MED ORDER — LORAZEPAM 2 MG/ML IJ SOLN
1.0000 mg | Freq: Once | INTRAMUSCULAR | Status: AC
  Administered 2022-05-04: 1 mg via INTRAVENOUS
  Filled 2022-05-04: qty 1

## 2022-05-04 MED ORDER — LEVOTHYROXINE SODIUM 88 MCG PO TABS
88.0000 ug | ORAL_TABLET | Freq: Every day | ORAL | Status: DC
Start: 2022-05-05 — End: 2022-05-06
  Administered 2022-05-05 – 2022-05-06 (×2): 88 ug via ORAL
  Filled 2022-05-04 (×2): qty 1

## 2022-05-04 NOTE — ED Notes (Signed)
ED TO INPATIENT HANDOFF REPORT  ED Nurse Name and Phone #: Tonette Bihari [phone]  S Name/Age/Gender Anna [name] 66 y.o. female Room/Bed: WA08/WA08  Code Status   Code Status: Prior  Home/SNF/Other Home Patient oriented to: self, place, time, and situation Is this baseline? Yes   Triage Complete: Triage complete  Chief Complaint Lactic acidosis [E87.20]  Triage Note Pt BIB EMS from home c/o increased weakness, sob and vomiting. Pt is a cancer pt has not had radiation/chemo in 6 weeks. Pt also c/o swelling to ankles and abdomen.  96% room 134/76 bp 104 HR Bgl 70 97.7 temp 22 RR   Allergies Allergies  Allergen Reactions   Bee Venom Anaphylaxis   Fire Ant Anaphylaxis    Level of Care/Admitting Diagnosis ED Disposition     ED Disposition  Admit   Condition  --   Comment  Hospital Area: [facility] [[id number]]  Level of Care: Telemetry [5]  Admit to tele based on following criteria: Monitor for Ischemic changes  May place patient in observation at [facility] or [facility] if equivalent level of care is available:: No  Covid Evaluation: Asymptomatic - no recent exposure (last 10 days) testing not required  Diagnosis: Lactic acidosis [400867]  Admitting Physician: Vernelle Emerald [[id number]]  Attending Physician: Vernelle Emerald [[id number]]          B Medical/Surgery History Past Medical History:  Diagnosis Date   Anemia    Anxiety    Arthritis    Finger   Depression    Diverticulosis    Fallopian tube carcinoma, left ([place]) 2018   Family history of adverse reaction to anesthesia    Mother has extreme [place] with anesthesia   Family history of breast cancer    Fatty liver    Noted on CT scan   Genetic testing 11/07/2017   GERD (gastroesophageal reflux disease)    Heart murmur    Hx of   History of chicken pox    Hydronephrosis, left    Hypercholesterolemia    Hypertension    Hypothyroidism    Macular degeneration  disease    Pneumonia    Pulmonary embolism ([place]) 2020   Retinitis pigmentosa    Sleep apnea    No longer wears cpap   Past Surgical History:  Procedure Laterality Date   ABDOMINAL HYSTERECTOMY     Dr. Denman George 07/17/17   [place] Right 2000   [place]     x 2   COLONOSCOPY     CYSTOSCOPY W/ URETERAL STENT PLACEMENT Bilateral 07/18/2021   Procedure: CYSTOSCOPY WITH BILATERAL RETROGRADE PYELOGRAM/ LEFT URETERAL STENT PLACEMENT;  Surgeon: Ardis Hughs, MD;  Location: [facility];  Service: Urology;  Laterality: Bilateral;   CYSTOSCOPY W/ URETERAL STENT PLACEMENT Left 12/28/2021   Procedure: CYSTOSCOPY WITH BILATURIARETROGRADE PYELOGRAM LEFT URETERAL STENT EXCHANGE, URETEROSCOPY;  Surgeon: Ardis Hughs, MD;  Location: [facility];  Service: Urology;  Laterality: Left;  left stent removed   CYSTOSCOPY W/ URETERAL STENT PLACEMENT Left 03/07/2022   Procedure: CYSTOSCOPY WITH LEFT STENT REMOVAL AND INSERTION OF LEFT METALLIC URETERAL STENT;  Surgeon: Ardis Hughs, MD;  Location: [facility];  Service: Urology;  Laterality: Left;  NEED 45 MINS FOR CASE   EYE SURGERY Bilateral 2008   Lasix eye srg   IR IMAGING GUIDED PORT INSERTION  08/26/2019   LAPAROSCOPY N/A 07/17/2017   Procedure: LAPAROSCOPY  DIAGNOSTIC;  Surgeon: Everitt Amber, MD;  Location: [facility];  Service: Gynecology;  Laterality: N/A;   LAPAROTOMY N/A 07/17/2017   Procedure: EXPLORATORY LAPAROTOMY;  Surgeon: Everitt Amber, MD;  Location: [facility];  Service: Gynecology;  Laterality: N/A;   OMENTECTOMY N/A 07/17/2017   Procedure: OMENTECTOMY WITH RADICAL TUMOR DEBULKING;  Surgeon: Everitt Amber, MD;  Location: [facility];  Service: Gynecology;  Laterality: N/A;   TUBAL LIGATION  1985   URETEROSCOPY  12/28/2021   Procedure: URETEROSCOPY;  Surgeon: Ardis Hughs, MD;  Location: [facility];  Service: Urology;;     A IV Location/Drains/Wounds Patient Lines/Drains/Airways  Status     Active Line/Drains/Airways     Name Placement date Placement time Site Days   Implanted Port Right Chest --  --  Chest  --   Peripheral IV 05/14/2022 20 G Left Antecubital 04/19/2022  1645  Antecubital  less than 1   Peripheral IV 05/03/2022 20 G Right Antecubital 04/26/2022  1833  Antecubital  less than 1   Ureteral Drain/Stent Left ureter 6 Fr. 03/07/22  1402  Left ureter  58            Intake/Output Last 24 hours No intake or output data in the 24 hours ending 04/21/2022 2031  Labs/Imaging Results for orders placed or performed during the hospital encounter of 04/29/2022 (from the past 48 hour(s))  CBC with Differential     Status: Abnormal   Collection Time: 04/18/2022  4:20 PM  Result Value Ref Range   WBC 10.9 (H) 4.0 - 10.5 K/uL   RBC 2.97 (L) 3.87 - 5.11 MIL/uL   Hemoglobin 9.8 (L) 12.0 - 15.0 g/dL   HCT 31.2 (L) 36.0 - 46.0 %   MCV 105.1 (H) 80.0 - 100.0 fL   MCH 33.0 26.0 - 34.0 pg   MCHC 31.4 30.0 - 36.0 g/dL   RDW 26.5 (H) 11.5 - 15.5 %   Platelets 134 (L) 150 - 400 K/uL    Comment: SPECIMEN CHECKED FOR CLOTS REPEATED TO VERIFY    nRBC 4.9 (H) 0.0 - 0.2 %   Neutrophils Relative % 79 %   Neutro Abs 8.5 (H) 1.7 - 7.7 K/uL   Lymphocytes Relative 10 %   Lymphs Abs 1.1 0.7 - 4.0 K/uL   Monocytes Relative 10 %   Monocytes Absolute 1.1 (H) 0.1 - 1.0 K/uL   Eosinophils Relative 0 %   Eosinophils Absolute 0.0 0.0 - 0.5 K/uL   Basophils Relative 0 %   Basophils Absolute 0.0 0.0 - 0.1 K/uL   Immature Granulocytes 1 %   Abs Immature Granulocytes 0.15 (H) 0.00 - 0.07 K/uL   Acanthocytes PRESENT    Polychromasia PRESENT     Comment: Performed at [facility], [place] [address], [place], [place] [address]  Comprehensive metabolic panel     Status: Abnormal   Collection Time: 05/17/2022  4:20 PM  Result Value Ref Range   Sodium 130 (L) 135 - 145 mmol/L   Potassium 4.6 3.5 - 5.1 mmol/L   Chloride 96 (L) 98 - 111 mmol/L   CO2 17 (L) 22 - 32 mmol/L    Glucose, Bld 55 (L) 70 - 99 mg/dL    Comment: Glucose reference range applies only to samples taken after fasting for at least 8 hours.   BUN 34 (H) 8 - 23 mg/dL   Creatinine, Ser 1.64 (H) 0.44 - 1.00 mg/dL   Calcium 8.0 (L) 8.9 - 10.3 mg/dL   Total  Protein 6.6 6.5 - 8.1 g/dL   Albumin 1.9 (L) 3.5 - 5.0 g/dL   AST 523 (H) 15 - 41 U/L   ALT 146 (H) 0 - 44 U/L   Alkaline Phosphatase 612 (H) 38 - 126 U/L   Total Bilirubin 7.3 (H) 0.3 - 1.2 mg/dL   GFR, Estimated 35 (L) >60 mL/min    Comment: (NOTE) Calculated using the CKD-EPI Creatinine Equation (2021)    Anion gap 17 (H) 5 - 15    Comment: Performed at [facility], [place] [address], [place], [place] [address]  Brain natriuretic peptide     Status: Abnormal   Collection Time: 05/14/2022  4:20 PM  Result Value Ref Range   B Natriuretic Peptide 200.0 (H) 0.0 - 100.0 pg/mL    Comment: Performed at [facility], [place] [address] [place], [place] [address]  Troponin I (High Sensitivity)     Status: Abnormal   Collection Time: 05/09/2022  4:20 PM  Result Value Ref Range   Troponin I (High Sensitivity) 48 (H) <18 ng/L    Comment: (NOTE) Elevated high sensitivity troponin I (hsTnI) values and significant  changes across serial measurements may suggest ACS but many other  chronic and acute conditions are known to elevate hsTnI results.  Refer to the "Links" section for chest pain algorithms and additional  guidance. Performed at [facility], [place] [address], [place], [place] [address]   Lactic acid, plasma     Status: Abnormal   Collection Time: 04/29/2022  4:53 PM  Result Value Ref Range   Lactic Acid, Venous 8.3 (HH) 0.5 - 1.9 mmol/L    Comment: CRITICAL RESULT CALLED TO, READ BACK BY AND VERIFIED WITH: IYARI, RN @ 2751 ON 04/22/2022 BY Ruffin Frederick, MLT Performed at [facility], [place] [address], [place], [place] [address]   Ammonia     Status: Abnormal   Collection  Time: 04/27/2022  6:32 PM  Result Value Ref Range   Ammonia 50 (H) 9 - 35 umol/L    Comment: Performed at [facility], [place] [address], [place], [place], [place] [address]  Lactic acid, plasma     Status: Abnormal   Collection Time: 05/03/2022  6:41 PM  Result Value Ref Range   Lactic Acid, Venous 7.3 (HH) 0.5 - 1.9 mmol/L    Comment: CRITICAL VALUE NOTED.  VALUE IS CONSISTENT WITH PREVIOUSLY REPORTED AND CALLED VALUE. Performed at [facility], [place] [address], [place], [place] [address]   Troponin I (High Sensitivity)     Status: Abnormal   Collection Time: 04/19/2022  6:52 PM  Result Value Ref Range   Troponin I (High Sensitivity) 37 (H) <18 ng/L    Comment: (NOTE) Elevated high sensitivity troponin I (hsTnI) values and significant  changes across serial measurements may suggest ACS but many other  chronic and acute conditions are known to elevate hsTnI results.  Refer to the "Links" section for chest pain algorithms and additional  guidance. Performed at [facility], [place] [address] [place],  [address]   Urinalysis, Routine w reflex microscopic     Status: Abnormal   Collection Time: 04/19/2022  7:46 PM  Result Value Ref Range   Color, Urine AMBER (A) YELLOW    Comment: BIOCHEMICALS MAY BE AFFECTED BY COLOR   APPearance CLOUDY (A) CLEAR   Specific Gravity, Urine >1.046 (H) 1.005 - 1.030   pH 5.0 5.0 - 8.0   Glucose, UA  NEGATIVE NEGATIVE mg/dL   Hgb urine dipstick LARGE (A) NEGATIVE   Bilirubin Urine SMALL (A) NEGATIVE   Ketones, ur NEGATIVE NEGATIVE mg/dL   Protein, ur 30 (A) NEGATIVE mg/dL   Nitrite NEGATIVE NEGATIVE   Leukocytes,Ua LARGE (A) NEGATIVE   RBC / HPF >50 (H) 0 - 5 RBC/hpf   WBC, UA >50 (H) 0 - 5 WBC/hpf   Bacteria, UA NONE SEEN NONE SEEN   Squamous Epithelial / LPF 0-5 0 - 5   WBC Clumps PRESENT    Mucus PRESENT     Comment: Performed at [facility], [place] [address] [place], [place]  [address]   CT Angio Chest PE W/Cm &/Or Wo Cm  Result Date: 04/30/2022 CLINICAL DATA:  Pulmonary embolism (PE) suspected, high prob; Nausea/vomiting Sepsis Abdominal pain, acute, nonlocalized. History of metastatic fallopian tube carcinoma EXAM: CT ANGIOGRAPHY CHEST CT ABDOMEN AND PELVIS WITH CONTRAST TECHNIQUE: Multidetector CT imaging of the chest was performed using the standard protocol during bolus administration of intravenous contrast. Multiplanar CT image reconstructions and MIPs were obtained to evaluate the vascular anatomy. Multidetector CT imaging of the abdomen and pelvis was performed using the standard protocol during bolus administration of intravenous contrast. RADIATION DOSE REDUCTION: This exam was performed according to the departmental dose-optimization program which includes automated exposure control, adjustment of the mA and/or kV according to patient size and/or use of iterative reconstruction technique. CONTRAST:  173m OMNIPAQUE IOHEXOL 350 MG/ML SOLN COMPARISON:  CT 03/18/2022, 04/09/2022 FINDINGS: CTA CHEST FINDINGS Cardiovascular: Right chest wall port in place. Satisfactory opacification of the pulmonary arteries to the segmental level. Evaluation is slightly degraded by respiratory motion artifact. No evidence of pulmonary embolism. Thoracic aorta is nonaneurysmal. Mild atherosclerotic calcification of the aorta and coronary arteries. Normal heart size. No pericardial effusion. Elevation of the right hemidiaphragm with mass effect upon the heart and mediastinum. Mediastinum/Nodes: No pathologically enlarged axillary or mediastinal lymph nodes. Increasing soft tissue density in the bilateral hilar regions could be reactive or represent nodal metastatic disease. Trachea and esophagus within normal limits. Lungs/Pleura: Innumerable bilateral pulmonary nodules which appear increasing in size and number compared to the previous CT. Atelectasis within the right lung base and lingula. No  pleural effusion or pneumothorax. Musculoskeletal: No chest wall abnormality. No acute or significant osseous findings. Review of the MIP images confirms the above findings. CT ABDOMEN and PELVIS FINDINGS Hepatobiliary: Innumerable masses throughout the liver compatible with known metastatic disease. Liver is enlarged measuring nearly 25 cm in length and has increased in size from prior suggesting progression of underlying metastases. Gallbladder is decompressed. Pancreas: Unremarkable. No pancreatic ductal dilatation or surrounding inflammatory changes. Spleen: Normal in size without focal abnormality. Adrenals/Urinary Tract: Unremarkable adrenal glands. Left-sided nephroureteral stent remains in place. Unchanged appearance of the kidneys. No renal stone or hydronephrosis. Urinary bladder is incompletely distended. Stomach/Bowel: Stomach is within normal limits. No evidence of bowel wall thickening, distention, or inflammatory changes. Vascular/Lymphatic: Scattered aortoiliac atherosclerotic calcifications without aneurysm. No abdominopelvic lymphadenopathy is identified. Reproductive: Abnormal soft tissue in the left adnexal region abutting the distal left ureter appears increased in size on today's exam measuring approximately 4.6 x 2.7 cm (series 11, image 82), previously measured 3.7 x 2.4 cm on 03/18/2022. Adjacent soft tissue nodule measures up to 1.1 cm (series 11, image 78), previously 0.9 cm. Prior hysterectomy. No right adnexal abnormality. Other: Small volume ascites within the pelvis, increased. Small peritoneal nodule in the left upper quadrant measures 0.9 cm (series 11, image 52),  previously 0.7 cm. Small ventral abdominal wall hernia containing only fat. No pneumoperitoneum. Musculoskeletal: No acute or significant osseous findings. Review of the MIP images confirms the above findings. IMPRESSION: CTA chest: 1. No evidence of pulmonary embolism. 2. Innumerable bilateral pulmonary nodules which  appear increasing in size and number compared to the previous CT, consistent with progressive pulmonary metastatic disease. 3. Increasing soft tissue density in the bilateral hilar regions could be reactive or represent nodal metastatic disease. CT abdomen pelvis: 1. Enlarging liver containing innumerable masses compatible with progressive hepatic metastatic disease. 2. Interval increase in size of left adnexal soft tissue mass abutting the distal left ureter, consistent known malignancy. 3. Slight interval increase in size of a small peritoneal nodule in the left upper quadrant, most compatible with metastatic disease. 4. Small volume ascites within the pelvis, increased. Aortic Atherosclerosis (ICD10-I70.0). Electronically Signed   By: Davina Poke D.O.   On: 05/03/2022 19:26   CT ABDOMEN PELVIS W CONTRAST  Result Date: 05/06/2022 CLINICAL DATA:  Pulmonary embolism (PE) suspected, high prob; Nausea/vomiting Sepsis Abdominal pain, acute, nonlocalized. History of metastatic fallopian tube carcinoma EXAM: CT ANGIOGRAPHY CHEST CT ABDOMEN AND PELVIS WITH CONTRAST TECHNIQUE: Multidetector CT imaging of the chest was performed using the standard protocol during bolus administration of intravenous contrast. Multiplanar CT image reconstructions and MIPs were obtained to evaluate the vascular anatomy. Multidetector CT imaging of the abdomen and pelvis was performed using the standard protocol during bolus administration of intravenous contrast. RADIATION DOSE REDUCTION: This exam was performed according to the departmental dose-optimization program which includes automated exposure control, adjustment of the mA and/or kV according to patient size and/or use of iterative reconstruction technique. CONTRAST:  152m OMNIPAQUE IOHEXOL 350 MG/ML SOLN COMPARISON:  CT 03/18/2022, 04/09/2022 FINDINGS: CTA CHEST FINDINGS Cardiovascular: Right chest wall port in place. Satisfactory opacification of the pulmonary arteries to  the segmental level. Evaluation is slightly degraded by respiratory motion artifact. No evidence of pulmonary embolism. Thoracic aorta is nonaneurysmal. Mild atherosclerotic calcification of the aorta and coronary arteries. Normal heart size. No pericardial effusion. Elevation of the right hemidiaphragm with mass effect upon the heart and mediastinum. Mediastinum/Nodes: No pathologically enlarged axillary or mediastinal lymph nodes. Increasing soft tissue density in the bilateral hilar regions could be reactive or represent nodal metastatic disease. Trachea and esophagus within normal limits. Lungs/Pleura: Innumerable bilateral pulmonary nodules which appear increasing in size and number compared to the previous CT. Atelectasis within the right lung base and lingula. No pleural effusion or pneumothorax. Musculoskeletal: No chest wall abnormality. No acute or significant osseous findings. Review of the MIP images confirms the above findings. CT ABDOMEN and PELVIS FINDINGS Hepatobiliary: Innumerable masses throughout the liver compatible with known metastatic disease. Liver is enlarged measuring nearly 25 cm in length and has increased in size from prior suggesting progression of underlying metastases. Gallbladder is decompressed. Pancreas: Unremarkable. No pancreatic ductal dilatation or surrounding inflammatory changes. Spleen: Normal in size without focal abnormality. Adrenals/Urinary Tract: Unremarkable adrenal glands. Left-sided nephroureteral stent remains in place. Unchanged appearance of the kidneys. No renal stone or hydronephrosis. Urinary bladder is incompletely distended. Stomach/Bowel: Stomach is within normal limits. No evidence of bowel wall thickening, distention, or inflammatory changes. Vascular/Lymphatic: Scattered aortoiliac atherosclerotic calcifications without aneurysm. No abdominopelvic lymphadenopathy is identified. Reproductive: Abnormal soft tissue in the left adnexal region abutting the  distal left ureter appears increased in size on today's exam measuring approximately 4.6 x 2.7 cm (series 11, image 82), previously measured 3.7 x 2.4 cm on  03/18/2022. Adjacent soft tissue nodule measures up to 1.1 cm (series 11, image 78), previously 0.9 cm. Prior hysterectomy. No right adnexal abnormality. Other: Small volume ascites within the pelvis, increased. Small peritoneal nodule in the left upper quadrant measures 0.9 cm (series 11, image 52), previously 0.7 cm. Small ventral abdominal wall hernia containing only fat. No pneumoperitoneum. Musculoskeletal: No acute or significant osseous findings. Review of the MIP images confirms the above findings. IMPRESSION: CTA chest: 1. No evidence of pulmonary embolism. 2. Innumerable bilateral pulmonary nodules which appear increasing in size and number compared to the previous CT, consistent with progressive pulmonary metastatic disease. 3. Increasing soft tissue density in the bilateral hilar regions could be reactive or represent nodal metastatic disease. CT abdomen pelvis: 1. Enlarging liver containing innumerable masses compatible with progressive hepatic metastatic disease. 2. Interval increase in size of left adnexal soft tissue mass abutting the distal left ureter, consistent known malignancy. 3. Slight interval increase in size of a small peritoneal nodule in the left upper quadrant, most compatible with metastatic disease. 4. Small volume ascites within the pelvis, increased. Aortic Atherosclerosis (ICD10-I70.0). Electronically Signed   By: Davina Poke D.O.   On: 05/06/2022 19:26   DG Chest 2 View  Result Date: 05/02/2022 CLINICAL DATA:  Shortness of breath and weakness. Vomiting. History of fallopian tube carcinoma as patient has not had radiation/chemotherapy in 6 weeks. EXAM: CHEST - 2 VIEW COMPARISON:  Chest x-ray 01/13/2017 and chest CT 04/09/2022 FINDINGS: Right-sided Port-A-Cath has tip over the SVC. Lungs are hypoinflated with mild  elevation the right hemidiaphragm. There is linear atelectasis over the right midlung and left base. No evidence of effusion. Evidence of patient's known multiple pulmonary nodules left worse than right compatible with known pulmonary metastatic disease. Cardiomediastinal silhouette and remainder of the exam is unchanged. IMPRESSION: 1. Hypoinflation with mild linear atelectasis over the right midlung and left base. 2. Known pulmonary metastatic disease. Electronically Signed   By: Marin Olp M.D.   On: 05/08/2022 16:40    Pending Labs Unresulted Labs (From admission, onward)     Start     Ordered   05/17/2022 2014  SARS Coronavirus 2 by RT PCR (hospital order, performed in [facility] hospital lab) *cepheid single result test* Anterior Nasal Swab  (Tier 2 - Symptomatic/Asymptomatic)  Once,   R        05/03/2022 2013   05/02/2022 2014  Procalcitonin - Baseline  Add-on,   AD        04/20/2022 2013   05/02/2022 2014  C-reactive protein  Add-on,   AD        04/24/2022 2013   04/26/2022 1947  Protime-INR  Once,   STAT        04/29/2022 1946   05/08/2022 1807  Urine Culture  Once,   URGENT       Question:  Indication  Answer:  Sepsis   05/16/2022 1809   05/11/2022 1807  Culture, blood (routine x 2)  BLOOD CULTURE X 2,   R (with STAT occurrences)      05/05/2022 1809            Vitals/Pain Today's Vitals   05/01/2022 1615 04/30/2022 1730 04/20/2022 1751 05/02/2022 1917  BP: 110/80   126/78  Pulse: 96 91  90  Resp: 17 (!) 21  17  Temp:      SpO2: 99% 99%  100%  Weight:      Height:      PainSc:  3      Isolation Precautions Airborne and Contact precautions  Medications Medications  piperacillin-tazobactam (ZOSYN) IVPB 3.375 g (has no administration in time range)  sodium chloride 0.9 % bolus 1,000 mL (has no administration in time range)  sodium chloride 0.9 % bolus 1,000 mL (1,000 mLs Intravenous New Bag/Given 05/02/2022 1718)  fentaNYL (SUBLIMAZE) injection 25 mcg (25 mcg Intravenous Given 05/02/2022 1717)   ondansetron (ZOFRAN) injection 4 mg (4 mg Intravenous Given 04/20/2022 1718)  LORazepam (ATIVAN) injection 1 mg (1 mg Intravenous Given 05/02/2022 1829)  sodium chloride (PF) 0.9 % injection (  Given by Other 05/14/2022 1843)  iohexol (OMNIPAQUE) 350 MG/ML injection 100 mL (100 mLs Intravenous Contrast Given 05/05/2022 1847)    Mobility manual wheelchair Low fall risk   Focused Assessments    R Recommendations: See Admitting Provider Note  Report given to:   Additional Notes:

## 2022-05-04 NOTE — Assessment & Plan Note (Signed)
   Rapidly progressive metastatic disease with both pulmonary and hepatic manifestations  Considering derangement of liver function patient's prognosis is exceedingly poor  Patient follows with Dr. Benay Spice in the outpatient setting, I have added him to the treatment team to request his assistance in managing this patient's oncologic complications, his assistance is appreciated.

## 2022-05-04 NOTE — Assessment & Plan Note (Signed)
   Patient is suffering from  diffuse hepatocellular injury due to rapidly progressive metastatic disease  Patient is exhibiting marked derangement of hepatic function enzymes, markedly elevated INR beyond what you would expect with regular Xarelto use, significant lactic acidosis and mild hyperammonemia all which are likely secondary at least in part due to this profound hepatocellular injury  ER provider has already discussed case with Dr. Luan Pulling with GI who will evaluate patient in the morning in consultation, their input is appreciated  In the meantime, holding Xarelto due to increased bleeding risk  If patient complains of ongoing constipation will likely use lactulose as our laxative of choice considering the risk of developing hepatic encephalopathy  Reducing dosage of as needed Tylenol  Performing serial hepatic function panels anticoagulation profiles

## 2022-05-04 NOTE — Assessment & Plan Note (Signed)
   Holding home antihypertensives as patient is currently exhibiting low normal blood pressures  As needed intravenous antihypertensives for markedly elevated blood pressure

## 2022-05-04 NOTE — Assessment & Plan Note (Signed)
.   Resume home regimen of Synthroid 

## 2022-05-04 NOTE — Assessment & Plan Note (Addendum)
   Patient presenting with evidence of leukocytosis, worsening abdominal pain and substantial lactic acidosis  Urinalysis appears to be suggestive of urinary tract infection, exam reveals lower abdominal tenderness.  While patient has multiple potential causes of abdominal pain, considering patient's immunocompromise state and indwelling ureteral stent will initiate broad-spectrum intravenous antibiotic therapy with Zosyn  Urine and blood cultures obtained  Hydrating patient with intravenous isotonic fluids  We will de-escalate antibiotic therapy based on culture results

## 2022-05-04 NOTE — Assessment & Plan Note (Signed)
·   Slightly elevated serial troponins with flat trajectory of elevation °· Patient is chest pain-free °· Likely secondary to underlying illness, plaque rupture is unlikely °· Monitoring patient on telemetry ° ° ° ° ° ° °

## 2022-05-04 NOTE — Progress Notes (Signed)
Pharmacy Antibiotic Note  Velora Bonnee Zertuche is a 66 y.o. 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  Plan: - zosyn 3.375 gm IV q8h (infuse over 4 hrs)  _________________________________________ Height: '5\' 3"'$  (160 cm) Weight: 72 kg (158 lb 11.7 oz) IBW/kg (Calculated) : 52.4  Temp (24hrs), Avg:97.6 F (36.4 C), Min:97.6 F (36.4 C), Max:97.6 F (36.4 C)  Recent Labs  Lab 05/03/2022 1620 04/30/2022 1653 05/16/2022 1841  WBC 10.9*  --   --   CREATININE 1.64*  --   --   LATICACIDVEN  --  8.3* 7.3*    Estimated Creatinine Clearance: 32.5 mL/min (A) (by C-G formula based on SCr of 1.64 mg/dL (H)).    Allergies  Allergen Reactions   Bee Venom Anaphylaxis   Fire Ant Anaphylaxis    Thank you for allowing pharmacy to be a part of this patient's care.  Lynelle Doctor 04/19/2022 9:28 PM

## 2022-05-04 NOTE — Assessment & Plan Note (Signed)
Continuing home regimen of daily PPI therapy.  

## 2022-05-04 NOTE — Assessment & Plan Note (Addendum)
.   Patient exhibiting evidence of acute kidney injury secondary to volume depletion and urinary tract infection . No evidence of new hydronephrosis on CT imaging of the abdomen, left ureteral stent remains in place . Creatinine is currently 1.64 an increase compared to baseline of 1.1 . Hydrating patient with intravenous isotonic fluids. . Treating suspected concurrent urinary tract infection . Strict input and output monitoring . Monitoring renal function and electrolytes with serial chemistries . Avoiding nephrotoxic agents if at all possible

## 2022-05-04 NOTE — ED Notes (Signed)
Placed pt on purewick  

## 2022-05-04 NOTE — Assessment & Plan Note (Addendum)
   Profound lactic acidosis that may be secondary to a combination of type A and type B acidosis  Lactic acidosis might be secondary to volume depletion and underlying infection but it is also quite possible that the patient is suffering from severe lactic acidosis due to rapidly progressive diffuse liver injury due to profound metastatic disease  Treating underlying infection with intravenous antibiotics  Hydrating with intravenous isotonic fluids  Performing serial lactic acid levels to ensure downtrending and resolution.  If we are unable to achieve normalizing the lactic acid then I am more inclined to believe that this is primarily a type B lactic acidosis

## 2022-05-04 NOTE — Assessment & Plan Note (Signed)
   Holding home regimen of Xarelto for now due to markedly elevated INR in the setting of diffuse liver injury with concern for risk of spontaneous bleeding

## 2022-05-04 NOTE — ED Triage Notes (Signed)
Pt BIB EMS from home c/o increased weakness, sob and vomiting. Pt is a cancer pt has not had radiation/chemo in 6 weeks. Pt also c/o swelling to ankles and abdomen.  96% room 134/76 bp 104 HR Bgl 70 97.7 temp 22 RR

## 2022-05-04 NOTE — H&P (Addendum)
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  Patient has been brought in by EMS from home due to complaints of increasing weakness shortness breath and vomiting.    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  Patient underwent debulking surgery 06/2017.  Patient has been following with Dr. Benay Spice in the outpatient setting and has received multiple courses of chemotherapy.  Patient's disease has been complicated by chronic left-sided hydronephrosis due to obstruction of the left ureter.  Patient underwent left ureteral stent placed in late 2022, last replaced 4/20 by Dr. Louis Meckel with Urology due to recurrent hydronephrosis.  Patient has recently been identified to have new metastatic disease to the lungs and liver now currently receiving pembrolizumab last dose 5/26.  Patient explains that she has been experiencing increasing weakness and shortness of breath and lower abdominal pain over the past 6 weeks.  In the past several days symptoms have become particularly severe.  Shortness of breath is worse with exertion and improved with rest.  Patient is quite weak  and this is associated with poor oral intake, intermittent nausea and frequent episodes of nonbilious nonbloody vomiting.  Patient denies fever, diarrhea, dysuria, sick contacts, recent travel or contact with confirmed COVID-19 infection.  Upon evaluation in the emergency department CT imaging of the chest abdomen and pelvis once again redemonstrated innumerable bilateral areas of metastatic disease in addition to exhibiting an enlarging liver due to innumerable masses due to progressive hepatic metastatic disease.  Patient was found to have substantial lactic acidosis of 8.3 and evidence of acute kidney injury with creatinine of 1.64.  Urinalysis has been found to be suggestive of urinary tract infection.  ER provider discussed case with Dr. Sunny Schlein with [facility] who recommended initiation of intravenous Zosyn and stated that his team would evaluate the patient in the morning in consultation.  The hospitalist group is now been called to assess the patient for admission to the hospital.   Review of Systems: Review of Systems  Constitutional:  Positive for malaise/fatigue.  Gastrointestinal:  Positive for abdominal pain, nausea and vomiting.  Neurological:  Positive for weakness.     Past Medical History:  Diagnosis Date   Anemia    Anxiety    Arthritis    Finger   Depression    Diverticulosis    Fallopian tube carcinoma, left ([place]) 2018   Family history of adverse reaction to anesthesia    Mother has extreme [place] with anesthesia   Family history of breast cancer    Fatty liver    Noted on CT scan   Genetic testing 11/07/2017   GERD (gastroesophageal reflux disease)    Heart murmur    Hx of   History of chicken pox  Hydronephrosis, left    Hypercholesterolemia    Hypertension    Hypothyroidism    Macular degeneration disease    Pneumonia    Pulmonary embolism ([place]) 2020   Retinitis pigmentosa    Sleep apnea    No longer wears cpap    Past Surgical History:  Procedure  Laterality Date   ABDOMINAL HYSTERECTOMY     Dr. Denman [facility] 07/17/17   [place] Right 2000   [place]     x 2   COLONOSCOPY     CYSTOSCOPY W/ URETERAL STENT PLACEMENT Bilateral 07/18/2021   Procedure: CYSTOSCOPY WITH BILATERAL RETROGRADE PYELOGRAM/ LEFT URETERAL STENT PLACEMENT;  Surgeon: Ardis Hughs, MD;  Location: [facility];  Service: Urology;  Laterality: Bilateral;   CYSTOSCOPY W/ URETERAL STENT PLACEMENT Left 12/28/2021   Procedure: CYSTOSCOPY WITH BILATURIARETROGRADE PYELOGRAM LEFT URETERAL STENT EXCHANGE, URETEROSCOPY;  Surgeon: Ardis Hughs, MD;  Location: [facility];  Service: Urology;  Laterality: Left;  left stent removed   CYSTOSCOPY W/ URETERAL STENT PLACEMENT Left 03/07/2022   Procedure: CYSTOSCOPY WITH LEFT STENT REMOVAL AND INSERTION OF LEFT METALLIC URETERAL STENT;  Surgeon: Ardis Hughs, MD;  Location: [facility];  Service: Urology;  Laterality: Left;  NEED 45 MINS FOR CASE   EYE SURGERY Bilateral 2008   Lasix eye srg   IR IMAGING GUIDED PORT INSERTION  08/26/2019   LAPAROSCOPY N/A 07/17/2017   Procedure: LAPAROSCOPY DIAGNOSTIC;  Surgeon: Everitt Amber, MD;  Location: [facility];  Service: Gynecology;  Laterality: N/A;   LAPAROTOMY N/A 07/17/2017   Procedure: EXPLORATORY LAPAROTOMY;  Surgeon: Everitt Amber, MD;  Location: [facility];  Service: Gynecology;  Laterality: N/A;   OMENTECTOMY N/A 07/17/2017   Procedure: OMENTECTOMY WITH RADICAL TUMOR DEBULKING;  Surgeon: Everitt Amber, MD;  Location: [facility];  Service: Gynecology;  Laterality: N/A;   TUBAL LIGATION  1985   URETEROSCOPY  12/28/2021   Procedure: URETEROSCOPY;  Surgeon: Ardis Hughs, MD;  Location: [facility];  Service: Urology;;    Social History:  reports that she has never smoked. She has never used smokeless tobacco. She reports that she does not drink alcohol and does not use drugs.  Allergies  Allergen Reactions   Bee Venom Anaphylaxis    Fire Ant Anaphylaxis    Family History  Problem Relation Age of Onset   Hyperlipidemia Mother    Hypertension Mother    Alzheimer's disease Mother    Arthritis Father    Hyperlipidemia Father    Heart disease Father        first MI age 25   Hypertension Father    Hyperlipidemia Brother    Heart disease Brother        heart disease dx at a youg age   Hypertension Brother    Colon polyps Brother    Alzheimer's disease Maternal Aunt    Hyperlipidemia Maternal Uncle    Heart disease Maternal Uncle    Sudden death Maternal Uncle 33       massive heart attack in doctors office   Arthritis Maternal Grandmother    Hyperlipidemia Maternal Grandmother    Stroke Maternal Grandmother    Hypertension Maternal Grandmother    Alzheimer's disease Maternal Grandmother    Alcohol abuse Maternal Grandfather    Hypertension Maternal Grandfather    Diabetes Maternal Grandfather    Cancer Paternal Grandmother        Ovarian   Hypertension Paternal  Grandmother    Alcohol abuse Paternal Grandfather    Hyperlipidemia Paternal Grandfather    Heart disease Paternal Grandfather    Hypertension Paternal Grandfather    Breast cancer Cousin        pat cousin   Colon cancer Neg Hx    Rectal cancer Neg Hx    Stomach cancer Neg Hx     Prior to Admission medications   Medication Sig Start Date End Date Taking? Authorizing Provider  amLODipine (NORVASC) 5 MG tablet Take 1 tablet (5 mg total) by mouth daily. 01/25/22  Yes Einar Pheasant, MD  buPROPion (WELLBUTRIN XL) 300 MG 24 hr tablet Take 1 tablet by mouth once daily Patient taking differently: Take 300 mg by mouth daily. 04/16/22  Yes Einar Pheasant, MD  EPINEPHrine (EPIPEN 2-PAK) 0.3 mg/0.3 mL IJ SOAJ injection Inject 0.3 mg into the muscle as needed for anaphylaxis. 10/19/21  Yes Einar Pheasant, MD  HYDROcodone-acetaminophen (NORCO) 5-325 MG tablet Take 1 tablet by mouth every 4 (four) hours as needed for moderate pain. 04/23/22  Yes Ladell Pier, MD  levothyroxine (SYNTHROID) 88 MCG tablet Take 1 tablet (88 mcg total) by mouth daily. 10/25/21  Yes Einar Pheasant, MD  Multiple Vitamins-Minerals [facility] ADULT 50+) CAPS Take 1 tablet by mouth daily. 04/07/21  Yes [provider]  omeprazole (PRILOSEC) 20 MG capsule Take 20 mg by mouth daily.   Yes [provider]  ondansetron (ZOFRAN) 8 MG tablet Take 1 tablet (8 mg total) by mouth every 8 (eight) hours as needed for nausea or vomiting. 05/29/21  Yes Ladell Pier, MD  pantoprazole (PROTONIX) 20 MG tablet Take 20 mg by mouth daily.   Yes [provider]  rosuvastatin (CRESTOR) 5 MG tablet Take 1 tablet by mouth once daily Patient taking differently: Take 5 mg by mouth daily. 04/16/22  Yes Einar Pheasant, MD  sorbitol 70 % solution Take 1 Tablespoon (15 ml) every 4 hours until bowel movement. After bowel movement one time daily 04/23/22  Yes Sherrill, Izola Price, MD  XARELTO 20 MG TABS tablet TAKE 1 TABLET BY MOUTH ONCE DAILY WITH SUPPER Patient taking differently: 20 mg daily with supper. 01/18/22  Yes Ladell Pier, MD  acyclovir (ZOVIRAX) 400 MG tablet Take one tablet tid prn flares Patient not taking: Reported on 05/01/2022 10/19/21   Einar Pheasant, MD  lidocaine-prilocaine (EMLA) cream Apply 1 application topically as directed. Apply 1 hour prior to port stick and cover with plastic wrap Patient not taking: Reported on 04/26/2022 10/19/21   Ladell Pier, MD  prochlorperazine (COMPAZINE) 10 MG tablet Take 1 tablet (10 mg total) by mouth every 6 (six) hours as needed for nausea or vomiting. 03/18/22   Ladell Pier, MD    Physical Exam:  Vitals:   05/05/2022 1600 04/26/2022 1615 05/16/2022 1730 04/18/2022 1917  BP: 100/78 110/80  126/78  Pulse: 99 96 91 90  Resp: (!) 25 17 (!) 21 17  Temp:      SpO2: 99% 99% 99% 100%  Weight:      Height:        Constitutional: Awake alert and oriented x3, no associated distress.   Skin: no rashes, no lesions, poor skin  turgor noted. Eyes: Pupils are equally reactive to light.  No evidence of scleral icterus or conjunctival pallor.  ENMT: Dry mucous membranes noted.  Posterior pharynx clear of any exudate or lesions.   Neck: normal, supple, no masses, no thyromegaly.  No evidence of  jugular venous distension.   Respiratory: Scattered rhonchi bilaterally with bibasilar rales.  No wheezing.  Normal respiratory effort. No accessory muscle use.  Cardiovascular: Regular rate and rhythm, no murmurs / rubs / gallops. No extremity edema. 2+ pedal pulses. No carotid bruits.  Chest:   Nontender without crepitus or deformity.   Back:   Nontender without crepitus or deformity. Abdomen: Abdomen is protuberant but soft.  Significant fullness noted in the right upper quadrant.  Notable lower abdominal tenderness...  Positive bowel sounds noted in all quadrants.   Musculoskeletal: No joint deformity upper and lower extremities. Good ROM, no contractures. Normal muscle tone.  Neurologic: CN 2-12 grossly intact. Sensation intact.  Patient moving all 4 extremities spontaneously.  Patient is following all commands.  Patient is responsive to verbal stimuli.   Psychiatric: Patient exhibits depressed mood with appropriate affect.  Patient seems to possess insight as to their current situation.    Data Reviewed:  I have personally reviewed and interpreted labs, imaging.  Significant findings are:  CBC revealing white blood cell count 10.9, hemoglobin 9.8, hematocrit 31.2, platelet count 134 Chemistry revealing sodium 130, potassium 4.6, chloride 96, bicarbonate 17, BUN 34, creatinine 1.64 BNP 200 Lactic acid 8.3 followed by 7.3 Chest x-ray personally reviewed revealing evidence of pulmonary metastatic disease without acute cardiopulmonary process.  EKG: Personally reviewed.  Rhythm is sinus tachycardia with heart rate of 100 bpm.  No dynamic ST segment changes appreciated.   Assessment and Plan: * Complicated UTI (urinary  tract infection) Patient presenting with evidence of leukocytosis, worsening abdominal pain and substantial lactic acidosis Urinalysis appears to be suggestive of urinary tract infection While patient has multiple potential causes of abdominal pain, considering patient's immunocompromise state and indwelling ureteral stent will initiate broad-spectrum intravenous antibiotic therapy with Zosyn Urine and blood cultures obtained Hydrating patient with intravenous isotonic fluids We will de-escalate antibiotic therapy based on culture results  Lactic acidosis Profound lactic acidosis that may be secondary to a combination of type A and type B acidosis Lactic acidosis might be secondary to volume depletion and underlying infection but it is also quite possible that the patient is suffering from severe lactic acidosis due to rapidly progressive diffuse liver injury due to profound metastatic disease Treating underlying infection with intravenous antibiotics Hydrating with intravenous isotonic fluids Performing serial lactic acid levels to ensure downtrending and resolution.  If we are unable to achieve normalizing the lactic acid then I am more inclined to believe that this is primarily a type B lactic acidosis  Acute liver failure without hepatic coma Patient is suffering from  diffuse hepatocellular injury due to rapidly progressive metastatic disease Patient is exhibiting marked derangement of hepatic function enzymes, markedly elevated INR beyond what you would expect with regular Xarelto use, significant lactic acidosis and mild hyperammonemia all which are likely secondary at least in part due to this profound hepatocellular injury ER provider has already discussed case with Dr. Luan Pulling with GI who will evaluate patient in the morning in consultation, their input is appreciated In the meantime, holding Xarelto due to increased bleeding risk If patient complains of ongoing constipation will  likely use lactulose as our laxative of choice considering the risk of developing hepatic encephalopathy Reducing dosage of as needed Tylenol Performing serial hepatic function panels anticoagulation profiles   Acute renal failure superimposed on stage 3a chronic kidney disease ([place]) Patient exhibiting evidence of acute kidney injury secondary to volume depletion and urinary tract infection No evidence of new hydronephrosis on  CT imaging of the abdomen, left ureteral stent remains in place Creatinine is currently 1.64 an increase compared to baseline of 1.1 Hydrating patient with intravenous isotonic fluids. Treating suspected concurrent urinary tract infection Strict input and output monitoring Monitoring renal function and electrolytes with serial chemistries Avoiding nephrotoxic agents if at all possible   Elevated troponin level not due myocardial infarction Slightly elevated serial troponins with flat trajectory of elevation Patient is chest pain-free Likely secondary to underlying illness, plaque rupture is unlikely Monitoring patient on telemetry   Malignant neoplasm of fallopian tube [facility]) Rapidly progressive metastatic disease with both pulmonary and hepatic manifestations Considering derangement of liver function patient's prognosis is exceedingly poor Patient follows with Dr. Benay Spice in the outpatient setting, I have added him to the treatment team to request his assistance in managing this patient's oncologic complications, his assistance is appreciated.   History of pulmonary embolism Holding home regimen of Xarelto for now due to markedly elevated INR in the setting of diffuse liver injury with concern for risk of spontaneous bleeding  Essential hypertension Holding home antihypertensives as patient is currently exhibiting low normal blood pressures As needed intravenous antihypertensives for markedly elevated blood pressure  Hypothyroidism Resume home regimen of  Synthroid    GERD without esophagitis Continuing home regimen of daily PPI therapy.      Code Status:  DNR code status decision has been confirmed with: Patient Family Communication: Husband and son are at the bedside who has been updated on plan of care.  Consults: Dr. Benay Spice added to treatment team.  Dr. Rosalie Gums with GI consulted by EDP who will evaluate in morning.  Severity of Illness:  The appropriate patient status for this patient is INPATIENT. Inpatient status is judged to be reasonable and necessary in order to provide the required intensity of service to ensure the patient's safety. The patient's presenting symptoms, physical exam findings, and initial radiographic and laboratory data in the context of their chronic comorbidities is felt to place them at high risk for further clinical deterioration. Furthermore, it is not anticipated that the patient will be medically stable for discharge from the hospital within 2 midnights of admission.   * I certify that at the point of admission it is my clinical judgment that the patient will require inpatient hospital care spanning beyond 2 midnights from the point of admission due to high intensity of service, high risk for further deterioration and high frequency of surveillance required.*  Author:  Vernelle Emerald MD  04/26/2022 11:03 PM

## 2022-05-04 NOTE — ED Provider Notes (Signed)
, [facility] DEPT Provider Note   CSN: [id number] Arrival date & time: 05/03/2022  1525     History  Chief Complaint  Patient presents with   Weakness    Anna [name] is a 66 y.o. female with multiple complaints.  Has noticed increasing weakness and shortness of breath over the last 6 weeks, with significant change in the last 2 to 3 days.  Hx of metastatic ovarian cancer with lung and urinary system involvement.  Had urinary stent placed 3 weeks ago without complication.  Recently underwent cancer treatment of pembrolizumab 2 weeks ago.  Yellowing of the skin started 2 days ago per spouse, patient denies noticing this until informed by EMS.  Also with worsening abdominal pain and intractable N/V.  Fevers, diarrhea, constipation, headache, vision changes.  Takes hydrocodone chronically for pain.  On Xarelto 20 mg daily.  The history is provided by the patient and medical records.  Weakness Associated symptoms: vomiting        Home Medications Prior to Admission medications   Medication Sig Start Date End Date Taking? Authorizing Provider  acyclovir (ZOVIRAX) 400 MG tablet Take one tablet tid prn flares Patient taking differently: Take 400 mg by mouth 3 (three) times daily as needed (flare ups). Take one tablet tid prn flares 10/19/21   Einar Pheasant, MD  amLODipine (NORVASC) 5 MG tablet Take 1 tablet (5 mg total) by mouth daily. 01/25/22   Einar Pheasant, MD  buPROPion (WELLBUTRIN XL) 300 MG 24 hr tablet Take 1 tablet by mouth once daily 04/16/22   Einar Pheasant, MD  EPINEPHrine (EPIPEN 2-PAK) 0.3 mg/0.3 mL IJ SOAJ injection Inject 0.3 mg into the muscle as needed for anaphylaxis. 10/19/21   Einar Pheasant, MD  HYDROcodone-acetaminophen (NORCO) 5-325 MG tablet Take 1 tablet by mouth every 4 (four) hours as needed for moderate pain. 04/23/22   Ladell Pier, MD  levothyroxine (SYNTHROID) 88 MCG tablet Take 1 tablet (88 mcg total) by mouth  daily. 10/25/21   Einar Pheasant, MD  lidocaine-prilocaine (EMLA) cream Apply 1 application topically as directed. Apply 1 hour prior to port stick and cover with plastic wrap 10/19/21   Ladell Pier, MD  Multiple Vitamins-Minerals [facility] ADULT 50+) CAPS Take 1 tablet by mouth daily. 04/07/21   [provider]  omeprazole (PRILOSEC) 20 MG capsule Take 20 mg by mouth daily.    [provider]  ondansetron (ZOFRAN) 8 MG tablet Take 1 tablet (8 mg total) by mouth every 8 (eight) hours as needed for nausea or vomiting. 05/29/21   Ladell Pier, MD  pantoprazole (PROTONIX) 20 MG tablet Take 20 mg by mouth daily.    [provider]  prochlorperazine (COMPAZINE) 10 MG tablet Take 1 tablet (10 mg total) by mouth every 6 (six) hours as needed for nausea or vomiting. 03/18/22   Ladell Pier, MD  rosuvastatin (CRESTOR) 5 MG tablet Take 1 tablet by mouth once daily 04/16/22   Einar Pheasant, MD  sorbitol 70 % solution Take 1 Tablespoon (15 ml) every 4 hours until bowel movement. After bowel movement one time daily 04/23/22   Ladell Pier, MD  XARELTO 20 MG TABS tablet TAKE 1 TABLET BY MOUTH ONCE DAILY WITH SUPPER Patient taking differently: 20 mg daily with supper. 01/18/22   Ladell Pier, MD      Allergies    Bee venom and Fire ant    Review of Systems   Review of Systems  Gastrointestinal:  Positive for vomiting.  Neurological:  Positive for weakness.    Physical Exam Updated Vital Signs BP 126/78 (BP Location: Left Arm)   Pulse 90   Temp 97.6 F (36.4 C)   Resp 17   Ht _0  (1.6 m)   Wt 72 kg   SpO2 100%   BMI 28.12 kg/m  Physical Exam Vitals and nursing note reviewed.  Constitutional:      General: She is not in acute distress.    Appearance: She is well-developed. She is ill-appearing. She is not diaphoretic.     Comments: Appears clinically dehydrated.  HENT:     Head: Normocephalic and atraumatic.     Mouth/Throat:     Mouth: Mucous  membranes are dry.     Pharynx: Oropharynx is clear.  Eyes:     General: Scleral icterus present.     Conjunctiva/sclera: Conjunctivae normal.  Cardiovascular:     Rate and Rhythm: Normal rate and regular rhythm.     Pulses:          Radial pulses are 2+ on the right side and 2+ on the left side.       Dorsalis pedis pulses are 2+ on the right side and 2+ on the left side.     Heart sounds: No murmur heard.    Comments: Nonpitting edema of the bilateral lower extremities. Pulmonary:     Effort: Pulmonary effort is normal. No respiratory distress.     Breath sounds: Wheezing present.  Chest:     Chest wall: No tenderness.  Abdominal:     General: Abdomen is protuberant. Bowel sounds are normal. There is distension.     Palpations: Abdomen is soft. There is hepatomegaly and splenomegaly.     Tenderness: There is abdominal tenderness in the right upper quadrant, epigastric area and left upper quadrant. There is no right CVA tenderness or left CVA tenderness.       Comments: Abdomen soft nontender.  Without obvious mass, ecchymosis, wound, rash.  Musculoskeletal:        General: No swelling.     Cervical back: Neck supple. No rigidity or tenderness.     Right lower leg: 1+ Edema present.     Left lower leg: 1+ Edema present.  Skin:    General: Skin is warm and dry.     Capillary Refill: Capillary refill takes less than 2 seconds.     Coloration: Skin is jaundiced.  Neurological:     Mental Status: She is alert and oriented to person, place, and time. Mental status is at baseline.     GCS: GCS eye subscore is 4. GCS verbal subscore is 5. GCS motor subscore is 6.     Cranial Nerves: No dysarthria or facial asymmetry.     Motor: No tremor or seizure activity.     Coordination: Coordination normal.  Psychiatric:        Mood and Affect: Mood normal.     ED Results / Procedures / Treatments   Labs (all labs ordered are listed, but only abnormal results are displayed) Labs  Reviewed  CBC WITH DIFFERENTIAL/PLATELET - Abnormal; Notable for the following components:      Result Value   WBC 10.9 (*)    RBC 2.97 (*)    Hemoglobin 9.8 (*)    HCT 31.2 (*)    MCV 105.1 (*)    RDW 26.5 (*)    Platelets 134 (*)    nRBC 4.9 (*)  Neutro Abs 8.5 (*)    Monocytes Absolute 1.1 (*)    Abs Immature Granulocytes 0.15 (*)    All other components within normal limits  COMPREHENSIVE METABOLIC PANEL - Abnormal; Notable for the following components:   Sodium 130 (*)    Chloride 96 (*)    CO2 17 (*)    Glucose, Bld 55 (*)    BUN 34 (*)    Creatinine, Ser 1.64 (*)    Calcium 8.0 (*)    Albumin 1.9 (*)    AST 523 (*)    ALT 146 (*)    Alkaline Phosphatase 612 (*)    Total Bilirubin 7.3 (*)    GFR, Estimated 35 (*)    Anion gap 17 (*)    All other components within normal limits  BRAIN NATRIURETIC PEPTIDE - Abnormal; Notable for the following components:   B Natriuretic Peptide 200.0 (*)    All other components within normal limits  URINALYSIS, ROUTINE W REFLEX MICROSCOPIC  LACTIC ACID, PLASMA - Abnormal; Notable for the following components:   Lactic Acid, Venous 8.3 (*)    All other components within normal limits  LACTIC ACID, PLASMA - Abnormal; Notable for the following components:   Lactic Acid, Venous 7.3 (*)    All other components within normal limits  AMMONIA - Abnormal; Notable for the following components:   Ammonia 50 (*)    All other components within normal limits  TROPONIN I (HIGH SENSITIVITY) - Abnormal; Notable for the following components:   Troponin I (High Sensitivity) 48 (*)    All other components within normal limits  TROPONIN I (HIGH SENSITIVITY) - Abnormal; Notable for the following components:   Troponin I (High Sensitivity) 37 (*)    All other components within normal limits  URINE CULTURE  CULTURE, BLOOD (ROUTINE X 2)  CULTURE, BLOOD (ROUTINE X 2)  PROTIME-INR  CBG MONITORING, ED    EKG EKG  Interpretation  Date/Time:  Saturday May 04 2022 15:54:09 EDT Ventricular Rate:  100 PR Interval:  134 QRS Duration: 69 QT Interval:  430 QTC Calculation: 555 R Axis:   16 Text Interpretation: Sinus tachycardia Low voltage, precordial leads Borderline T abnormalities, anterior leads Prolonged QT interval when compared to prior, longer QTC. No STEMI Confirmed by Antony Blackbird [id number]) on 04/20/2022 6:07:59 PM  Radiology CT Angio Chest PE W/Cm &/Or Wo Cm  Result Date: 05/05/2022 CLINICAL DATA:  Pulmonary embolism (PE) suspected, high prob; Nausea/vomiting Sepsis Abdominal pain, acute, nonlocalized. History of metastatic fallopian tube carcinoma EXAM: CT ANGIOGRAPHY CHEST CT ABDOMEN AND PELVIS WITH CONTRAST TECHNIQUE: Multidetector CT imaging of the chest was performed using the standard protocol during bolus administration of intravenous contrast. Multiplanar CT image reconstructions and MIPs were obtained to evaluate the vascular anatomy. Multidetector CT imaging of the abdomen and pelvis was performed using the standard protocol during bolus administration of intravenous contrast. RADIATION DOSE REDUCTION: This exam was performed according to the departmental dose-optimization program which includes automated exposure control, adjustment of the mA and/or kV according to patient size and/or use of iterative reconstruction technique. CONTRAST:  164m OMNIPAQUE IOHEXOL 350 MG/ML SOLN COMPARISON:  CT 03/18/2022, 04/09/2022 FINDINGS: CTA CHEST FINDINGS Cardiovascular: Right chest wall port in place. Satisfactory opacification of the pulmonary arteries to the segmental level. Evaluation is slightly degraded by respiratory motion artifact. No evidence of pulmonary embolism. Thoracic aorta is nonaneurysmal. Mild atherosclerotic calcification of the aorta and coronary arteries. Normal heart size. No pericardial effusion. Elevation of the right hemidiaphragm  with mass effect upon the heart and mediastinum.  Mediastinum/Nodes: No pathologically enlarged axillary or mediastinal lymph nodes. Increasing soft tissue density in the bilateral hilar regions could be reactive or represent nodal metastatic disease. Trachea and esophagus within normal limits. Lungs/Pleura: Innumerable bilateral pulmonary nodules which appear increasing in size and number compared to the previous CT. Atelectasis within the right lung base and lingula. No pleural effusion or pneumothorax. Musculoskeletal: No chest wall abnormality. No acute or significant osseous findings. Review of the MIP images confirms the above findings. CT ABDOMEN and PELVIS FINDINGS Hepatobiliary: Innumerable masses throughout the liver compatible with known metastatic disease. Liver is enlarged measuring nearly 25 cm in length and has increased in size from prior suggesting progression of underlying metastases. Gallbladder is decompressed. Pancreas: Unremarkable. No pancreatic ductal dilatation or surrounding inflammatory changes. Spleen: Normal in size without focal abnormality. Adrenals/Urinary Tract: Unremarkable adrenal glands. Left-sided nephroureteral stent remains in place. Unchanged appearance of the kidneys. No renal stone or hydronephrosis. Urinary bladder is incompletely distended. Stomach/Bowel: Stomach is within normal limits. No evidence of bowel wall thickening, distention, or inflammatory changes. Vascular/Lymphatic: Scattered aortoiliac atherosclerotic calcifications without aneurysm. No abdominopelvic lymphadenopathy is identified. Reproductive: Abnormal soft tissue in the left adnexal region abutting the distal left ureter appears increased in size on today's exam measuring approximately 4.6 x 2.7 cm (series 11, image 82), previously measured 3.7 x 2.4 cm on 03/18/2022. Adjacent soft tissue nodule measures up to 1.1 cm (series 11, image 78), previously 0.9 cm. Prior hysterectomy. No right adnexal abnormality. Other: Small volume ascites within the pelvis,  increased. Small peritoneal nodule in the left upper quadrant measures 0.9 cm (series 11, image 52), previously 0.7 cm. Small ventral abdominal wall hernia containing only fat. No pneumoperitoneum. Musculoskeletal: No acute or significant osseous findings. Review of the MIP images confirms the above findings. IMPRESSION: CTA chest: 1. No evidence of pulmonary embolism. 2. Innumerable bilateral pulmonary nodules which appear increasing in size and number compared to the previous CT, consistent with progressive pulmonary metastatic disease. 3. Increasing soft tissue density in the bilateral hilar regions could be reactive or represent nodal metastatic disease. CT abdomen pelvis: 1. Enlarging liver containing innumerable masses compatible with progressive hepatic metastatic disease. 2. Interval increase in size of left adnexal soft tissue mass abutting the distal left ureter, consistent known malignancy. 3. Slight interval increase in size of a small peritoneal nodule in the left upper quadrant, most compatible with metastatic disease. 4. Small volume ascites within the pelvis, increased. Aortic Atherosclerosis (ICD10-I70.0). Electronically Signed   By: Davina Poke D.O.   On: 04/19/2022 19:26   CT ABDOMEN PELVIS W CONTRAST  Result Date: 04/25/2022 CLINICAL DATA:  Pulmonary embolism (PE) suspected, high prob; Nausea/vomiting Sepsis Abdominal pain, acute, nonlocalized. History of metastatic fallopian tube carcinoma EXAM: CT ANGIOGRAPHY CHEST CT ABDOMEN AND PELVIS WITH CONTRAST TECHNIQUE: Multidetector CT imaging of the chest was performed using the standard protocol during bolus administration of intravenous contrast. Multiplanar CT image reconstructions and MIPs were obtained to evaluate the vascular anatomy. Multidetector CT imaging of the abdomen and pelvis was performed using the standard protocol during bolus administration of intravenous contrast. RADIATION DOSE REDUCTION: This exam was performed according  to the departmental dose-optimization program which includes automated exposure control, adjustment of the mA and/or kV according to patient size and/or use of iterative reconstruction technique. CONTRAST:  125m OMNIPAQUE IOHEXOL 350 MG/ML SOLN COMPARISON:  CT 03/18/2022, 04/09/2022 FINDINGS: CTA CHEST FINDINGS Cardiovascular: Right chest wall port in place. Satisfactory  opacification of the pulmonary arteries to the segmental level. Evaluation is slightly degraded by respiratory motion artifact. No evidence of pulmonary embolism. Thoracic aorta is nonaneurysmal. Mild atherosclerotic calcification of the aorta and coronary arteries. Normal heart size. No pericardial effusion. Elevation of the right hemidiaphragm with mass effect upon the heart and mediastinum. Mediastinum/Nodes: No pathologically enlarged axillary or mediastinal lymph nodes. Increasing soft tissue density in the bilateral hilar regions could be reactive or represent nodal metastatic disease. Trachea and esophagus within normal limits. Lungs/Pleura: Innumerable bilateral pulmonary nodules which appear increasing in size and number compared to the previous CT. Atelectasis within the right lung base and lingula. No pleural effusion or pneumothorax. Musculoskeletal: No chest wall abnormality. No acute or significant osseous findings. Review of the MIP images confirms the above findings. CT ABDOMEN and PELVIS FINDINGS Hepatobiliary: Innumerable masses throughout the liver compatible with known metastatic disease. Liver is enlarged measuring nearly 25 cm in length and has increased in size from prior suggesting progression of underlying metastases. Gallbladder is decompressed. Pancreas: Unremarkable. No pancreatic ductal dilatation or surrounding inflammatory changes. Spleen: Normal in size without focal abnormality. Adrenals/Urinary Tract: Unremarkable adrenal glands. Left-sided nephroureteral stent remains in place. Unchanged appearance of the kidneys.  No renal stone or hydronephrosis. Urinary bladder is incompletely distended. Stomach/Bowel: Stomach is within normal limits. No evidence of bowel wall thickening, distention, or inflammatory changes. Vascular/Lymphatic: Scattered aortoiliac atherosclerotic calcifications without aneurysm. No abdominopelvic lymphadenopathy is identified. Reproductive: Abnormal soft tissue in the left adnexal region abutting the distal left ureter appears increased in size on today's exam measuring approximately 4.6 x 2.7 cm (series 11, image 82), previously measured 3.7 x 2.4 cm on 03/18/2022. Adjacent soft tissue nodule measures up to 1.1 cm (series 11, image 78), previously 0.9 cm. Prior hysterectomy. No right adnexal abnormality. Other: Small volume ascites within the pelvis, increased. Small peritoneal nodule in the left upper quadrant measures 0.9 cm (series 11, image 52), previously 0.7 cm. Small ventral abdominal wall hernia containing only fat. No pneumoperitoneum. Musculoskeletal: No acute or significant osseous findings. Review of the MIP images confirms the above findings. IMPRESSION: CTA chest: 1. No evidence of pulmonary embolism. 2. Innumerable bilateral pulmonary nodules which appear increasing in size and number compared to the previous CT, consistent with progressive pulmonary metastatic disease. 3. Increasing soft tissue density in the bilateral hilar regions could be reactive or represent nodal metastatic disease. CT abdomen pelvis: 1. Enlarging liver containing innumerable masses compatible with progressive hepatic metastatic disease. 2. Interval increase in size of left adnexal soft tissue mass abutting the distal left ureter, consistent known malignancy. 3. Slight interval increase in size of a small peritoneal nodule in the left upper quadrant, most compatible with metastatic disease. 4. Small volume ascites within the pelvis, increased. Aortic Atherosclerosis (ICD10-I70.0). Electronically Signed   By: Davina Poke D.O.   On: 05/13/2022 19:26   DG Chest 2 View  Result Date: 05/09/2022 CLINICAL DATA:  Shortness of breath and weakness. Vomiting. History of fallopian tube carcinoma as patient has not had radiation/chemotherapy in 6 weeks. EXAM: CHEST - 2 VIEW COMPARISON:  Chest x-ray 01/13/2017 and chest CT 04/09/2022 FINDINGS: Right-sided Port-A-Cath has tip over the SVC. Lungs are hypoinflated with mild elevation the right hemidiaphragm. There is linear atelectasis over the right midlung and left base. No evidence of effusion. Evidence of patient's known multiple pulmonary nodules left worse than right compatible with known pulmonary metastatic disease. Cardiomediastinal silhouette and remainder of the exam is unchanged. IMPRESSION: 1. Hypoinflation  with mild linear atelectasis over the right midlung and left base. 2. Known pulmonary metastatic disease. Electronically Signed   By: Marin Olp M.D.   On: 04/26/2022 16:40    Procedures Procedures    Medications Ordered in ED Medications  piperacillin-tazobactam (ZOSYN) IVPB 3.375 g (has no administration in time range)  sodium chloride 0.9 % bolus 1,000 mL (1,000 mLs Intravenous New Bag/Given 04/25/2022 1718)  fentaNYL (SUBLIMAZE) injection 25 mcg (25 mcg Intravenous Given 05/10/2022 1717)  ondansetron (ZOFRAN) injection 4 mg (4 mg Intravenous Given 05/15/2022 1718)  LORazepam (ATIVAN) injection 1 mg (1 mg Intravenous Given 04/20/2022 1829)  sodium chloride (PF) 0.9 % injection (  Given by Other 04/22/2022 1843)  iohexol (OMNIPAQUE) 350 MG/ML injection 100 mL (100 mLs Intravenous Contrast Given 04/21/2022 1847)    ED Course/ Medical Decision Making/ A&P                           Medical Decision Making Amount and/or Complexity of Data Reviewed External Data Reviewed: notes. Labs: ordered. Decision-making details documented in ED Course. Radiology: ordered and independent interpretation performed. Decision-making details documented in ED Course. ECG/medicine  tests: ordered and independent interpretation performed. Decision-making details documented in ED Course.  Risk OTC drugs. Prescription drug management.   66 y.o. female presents to the ED for concern of Weakness     This involves an extensive number of treatment options, and is a complaint that carries with it a high risk of complications and morbidity.    Past Medical History / Co-morbidities / Social History: Hx of HTN, GERD, anxiety, depression, OSA, diverticulosis, hypothyroidism, prior pulmonary embolism, fatty liver, ovarian cancer, essential HTN, hypercholesterolemia  Additional History:  Internal and external records from outside source obtained and reviewed including oncology  Physical Exam: Physical exam performed. The pertinent findings include: Wheezing bilaterally, jaundiced appearance, scleral icterus, upper abdominal tenderness, mild bilateral nonpitting edema of lower extremities, mildly distended abdomen.  Lab Tests: I ordered, and personally interpreted labs.  The pertinent results include:   CBC: WBC 10.9 with left shift, Hgb 9.8 HCT 31.2 CMP/BMP: Mild hypoglycemia 55, mild hyponatremia 130.  Mild elevated BUN 34 and creatinine 1.64, with GFR of 35.  AST 523, ALT 146, alk phos 612.  Significant change of bilirubin from 2-7.3 within the last week.  Mild hypocalcemia of 8.0. BNP: Elevated at 200 Troponin: Initial 48, sequential 37 Lactic acid: Initial 8.3, sequential 7.3 Ammonia: 50 UA: Pending PT/INR: Pending Urine culture pending Blood cultures pending  Imaging Studies: I ordered imaging studies including CXR.  I independently visualized and interpreted said imaging.  Pertinent results include: CXR:  1. Hypoinflation with mild linear atelectasis over the right midlung and left base.  2. Known pulmonary metastatic disease 3. Right sided Port-A-Cath in place CTA abdomen/pelvis:  1. No evidence of pulmonary embolism.  2. Innumerable bilateral pulmonary  nodules which appear increasing in size and number compared to the previous CT, consistent with progressive pulmonary metastatic disease.  3. Increasing soft tissue density in the bilateral hilar regions could be reactive or represent nodal metastatic disease.  4. Enlarging liver containing innumerable masses compatible with progressive hepatic metastatic disease.  5. Interval increase in size of left adnexal soft tissue mass abutting the distal left ureter, consistent known malignancy.  6. Slight interval increase in size of a small peritoneal nodule in the left upper quadrant, most compatible with metastatic disease.  7. Small volume ascites within the pelvis, increased.  8. Aortic Atherosclerosis I agree with the radiologist interpretation.  Cardiac Monitoring: The patient was maintained on a cardiac monitor.  I personally viewed and interpreted the cardiac monitored which showed an underlying rhythm of: Normal sinus rhythm and sinus tachycardia  I personally ordered and interpreted EKG 12 lead findings, which showed: Longer QTc of 550, otherwise no significant changes  Medications: I have reviewed the patients home medicines and have made adjustments as needed  ED Course/Disposition: Pt well-appearing on exam.  Presenting with multiple complaints, including increasing symptoms of shortness of breath, weakness, abdominal pain, nausea and vomiting.  Also new symptoms of jaundice of the face and neck, scleral icterus, and nonpitting edema of the lower extremities.  Hx of known metastatic ovarian malignancy, with known lung and urinary involvement.  Is currently on pembrolizumab, most recent treatment 2 weeks ago.  Also 3 weeks ago had a urinary stent placed due to malignancy of the ureter causing obstruction.  Patient's spouse states that there is an appointment this coming Tuesday for what sounds like comfort care discussions.  Spouse was informed there may only have a few months to live per  oncologist.  Upon reevaluation, patient status appears unchanged.  CT imaging reveals worsening of metastasis to the liver and lungs, in addition to a small amount increasing of ascites.  CT imaging negative for PE.  Labs suggestive of possible liver impairment, if not headed towards failure.  Bilirubin jumped from 2-7.3 is concerning.  Also significant lactic acid of 8.3, which may explain the patient's tachypnea.  LFTs significantly elevated.  UA pending.  Troponins elevated 48 and 37, likely due to demand ischemia.  BNP of 200, mildly elevated.  Placed consult for gastroenterology.  UA, urine culture, blood cultures, PT/INR still pending.  After consideration of the diagnostic results and the patient's encounter today, I feel that the patient would benefit from hospital admission for further treatment and evaluation.  Discussed course of treatment thoroughly with the patient, whom demonstrated understanding.  Patient and spouse in agreement.  1945: I consulted with Dr. Sunny [facility], and discussed lab and imaging findings as well as pertinent plan - they recommend: Hospital admission and starting patient on broad-spectrum antibiotics such as Zosyn.  Gastroenterology will follow patient and see them first thing in the morning.  Placed consult for hospitalist.  2007: I consulted with Dr. Reva Bores of the Hospitalist team, and discussed lab and imaging findings as well as pertinent plan - they recommend: Hospital admission for further treatment and evaluation.  I discussed this case with my attending, Dr. Sherry Ruffing, who agreed with the proposed treatment course and cosigned this note including patient's presenting symptoms, physical exam, and planned diagnostics and interventions.  Attending physician stated agreement with plan or made changes to plan which were implemented.     This chart was dictated using voice recognition software.  Despite best efforts to proofread, errors can occur  which can change the documentation meaning.         Final Clinical Impression(s) / ED Diagnoses Final diagnoses:  Malignant neoplasm of ovary, unspecified laterality (HCC)  Pain of upper abdomen  Shortness of breath  Port-A-Cath in place  Nausea and vomiting, unspecified vomiting type    Rx / DC Orders ED Discharge Orders     None         Candace Cruise 16/10/96 2027    Tegeler, Gwenyth Allegra, MD 05/02/2022 [id number]

## 2022-05-05 ENCOUNTER — Other Ambulatory Visit: Payer: Self-pay | Admitting: Oncology

## 2022-05-05 DIAGNOSIS — C787 Secondary malignant neoplasm of liver and intrahepatic bile duct: Secondary | ICD-10-CM | POA: Diagnosis not present

## 2022-05-05 DIAGNOSIS — G893 Neoplasm related pain (acute) (chronic): Secondary | ICD-10-CM

## 2022-05-05 DIAGNOSIS — R778 Other specified abnormalities of plasma proteins: Secondary | ICD-10-CM | POA: Diagnosis not present

## 2022-05-05 DIAGNOSIS — K7200 Acute and subacute hepatic failure without coma: Secondary | ICD-10-CM | POA: Diagnosis not present

## 2022-05-05 DIAGNOSIS — I10 Essential (primary) hypertension: Secondary | ICD-10-CM | POA: Diagnosis not present

## 2022-05-05 DIAGNOSIS — N390 Urinary tract infection, site not specified: Secondary | ICD-10-CM | POA: Diagnosis not present

## 2022-05-05 DIAGNOSIS — R110 Nausea: Secondary | ICD-10-CM | POA: Diagnosis not present

## 2022-05-05 DIAGNOSIS — Z515 Encounter for palliative care: Secondary | ICD-10-CM

## 2022-05-05 DIAGNOSIS — C5700 Malignant neoplasm of unspecified fallopian tube: Secondary | ICD-10-CM | POA: Diagnosis not present

## 2022-05-05 LAB — CBG MONITORING, ED
Glucose-Capillary: 103 mg/dL — ABNORMAL HIGH (ref 70–99)
Glucose-Capillary: 163 mg/dL — ABNORMAL HIGH (ref 70–99)
Glucose-Capillary: 36 mg/dL — CL (ref 70–99)
Glucose-Capillary: 39 mg/dL — CL (ref 70–99)
Glucose-Capillary: 39 mg/dL — CL (ref 70–99)
Glucose-Capillary: 40 mg/dL — CL (ref 70–99)

## 2022-05-05 LAB — CBC WITH DIFFERENTIAL/PLATELET
Abs Immature Granulocytes: 0.12 10*3/uL — ABNORMAL HIGH (ref 0.00–0.07)
Basophils Absolute: 0 10*3/uL (ref 0.0–0.1)
Basophils Relative: 0 %
Eosinophils Absolute: 0 10*3/uL (ref 0.0–0.5)
Eosinophils Relative: 0 %
HCT: 31.6 % — ABNORMAL LOW (ref 36.0–46.0)
Hemoglobin: 9.6 g/dL — ABNORMAL LOW (ref 12.0–15.0)
Immature Granulocytes: 1 %
Lymphocytes Relative: 19 %
Lymphs Abs: 2.3 10*3/uL (ref 0.7–4.0)
MCH: 33 pg (ref 26.0–34.0)
MCHC: 30.4 g/dL (ref 30.0–36.0)
MCV: 108.6 fL — ABNORMAL HIGH (ref 80.0–100.0)
Monocytes Absolute: 1.1 10*3/uL — ABNORMAL HIGH (ref 0.1–1.0)
Monocytes Relative: 9 %
Neutro Abs: 8.3 10*3/uL — ABNORMAL HIGH (ref 1.7–7.7)
Neutrophils Relative %: 71 %
Platelets: 128 10*3/uL — ABNORMAL LOW (ref 150–400)
RBC: 2.91 MIL/uL — ABNORMAL LOW (ref 3.87–5.11)
RDW: 26.9 % — ABNORMAL HIGH (ref 11.5–15.5)
WBC: 11.8 10*3/uL — ABNORMAL HIGH (ref 4.0–10.5)
nRBC: 3.1 % — ABNORMAL HIGH (ref 0.0–0.2)

## 2022-05-05 LAB — HEPATIC FUNCTION PANEL
ALT: 206 U/L — ABNORMAL HIGH (ref 0–44)
AST: 772 U/L — ABNORMAL HIGH (ref 15–41)
Albumin: 1.9 g/dL — ABNORMAL LOW (ref 3.5–5.0)
Alkaline Phosphatase: 633 U/L — ABNORMAL HIGH (ref 38–126)
Bilirubin, Direct: 5 mg/dL — ABNORMAL HIGH (ref 0.0–0.2)
Indirect Bilirubin: 3 mg/dL — ABNORMAL HIGH (ref 0.3–0.9)
Total Bilirubin: 8 mg/dL — ABNORMAL HIGH (ref 0.3–1.2)
Total Protein: 6.6 g/dL (ref 6.5–8.1)

## 2022-05-05 LAB — PROTIME-INR
INR: 3.3 — ABNORMAL HIGH (ref 0.8–1.2)
Prothrombin Time: 33.6 seconds — ABNORMAL HIGH (ref 11.4–15.2)

## 2022-05-05 LAB — BASIC METABOLIC PANEL
Anion gap: 16 — ABNORMAL HIGH (ref 5–15)
BUN: 39 mg/dL — ABNORMAL HIGH (ref 8–23)
CO2: 17 mmol/L — ABNORMAL LOW (ref 22–32)
Calcium: 8 mg/dL — ABNORMAL LOW (ref 8.9–10.3)
Chloride: 98 mmol/L (ref 98–111)
Creatinine, Ser: 1.87 mg/dL — ABNORMAL HIGH (ref 0.44–1.00)
GFR, Estimated: 29 mL/min — ABNORMAL LOW (ref >60)
Glucose, Bld: 62 mg/dL — ABNORMAL LOW (ref 70–99)
Potassium: 4.4 mmol/L (ref 3.5–5.1)
Sodium: 131 mmol/L — ABNORMAL LOW (ref 135–145)

## 2022-05-05 LAB — MAGNESIUM: Magnesium: 2.5 mg/dL — ABNORMAL HIGH (ref 1.7–2.4)

## 2022-05-05 LAB — C-REACTIVE PROTEIN: CRP: <0.5 mg/dL (ref <1.0)

## 2022-05-05 LAB — MRSA NEXT GEN BY PCR, NASAL: MRSA by PCR Next Gen: NOT DETECTED

## 2022-05-05 LAB — HIV ANTIBODY (ROUTINE TESTING W REFLEX): HIV Screen 4th Generation wRfx: NONREACTIVE

## 2022-05-05 LAB — APTT: aPTT: 43 seconds — ABNORMAL HIGH (ref 24–36)

## 2022-05-05 LAB — LACTIC ACID, PLASMA: Lactic Acid, Venous: 5.7 mmol/L (ref 0.5–1.9)

## 2022-05-05 LAB — SARS CORONAVIRUS 2 BY RT PCR: SARS Coronavirus 2 by RT PCR: NEGATIVE

## 2022-05-05 MED ORDER — PROCHLORPERAZINE EDISYLATE 10 MG/2ML IJ SOLN
5.0000 mg | Freq: Four times a day (QID) | INTRAMUSCULAR | Status: DC | PRN
Start: 2022-05-05 — End: 2022-05-07

## 2022-05-05 MED ORDER — ENSURE ENLIVE PO LIQD
237.0000 mL | Freq: Two times a day (BID) | ORAL | Status: DC
Start: 2022-05-05 — End: 2022-05-07

## 2022-05-05 MED ORDER — DEXTROSE 50 % IV SOLN
INTRAVENOUS | Status: AC
  Administered 2022-05-05: 50 mL
  Filled 2022-05-05: qty 50

## 2022-05-05 MED ORDER — CHLORHEXIDINE GLUCONATE CLOTH 2 % EX PADS
6.0000 | MEDICATED_PAD | Freq: Every day | CUTANEOUS | Status: DC
  Administered 2022-05-05 – 2022-05-06 (×2): 6 via TOPICAL

## 2022-05-05 MED ORDER — ORAL CARE MOUTH RINSE: 15.0000 mL | OROMUCOSAL | Status: DC | PRN

## 2022-05-05 NOTE — Consult Note (Signed)
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  Also has pulmonary metastases.  Recent introduction of Keytruda therapy  Acute kidney injury with progression of pelvic metastases as well possibly playing a role in the acute kidney injury as well.  History of ureteral stenting with indwelling metallic ureteral stent on the left.  Placed 03/07/2022.   ==========================================================  Though there is a small chance that there is some toxicity related to the [facility] therapy I am afraid that her metastatic cancer is the problem and this is insurmountable.  Her liver is largely replaced by cancer at this point and this will continue and she will most likely to succumb to liver failure within weeks if not less, it is my assessment.  I explained this to her and that I thought it was time to decide whether or not to continue with therapy for the cancer versus comfort care.  I did state that discussion would best be had with her oncologist, Dr. Benay Spice.  He should be consulted to see her tomorrow.  Unfortunately I do not have anything other to recommend as far as therapy.  Her INR is somewhat improved which may be due to holding the Xarelto which could have had an effect but agree with admitting hospitalist that the elevation of INR was greater than what we would typically see from [place].  We can follow the INR.  If it is worsening consider vitamin K therapy though I fear she will undergo rapidly progressive loss of liver function.  Please call us back if there are other questions.  Dr. Fuller Plan will be covering the [facility] long service starting tomorrow.  However we are signing off at this  time.  Gatha Mayer, MD, [facility] See Shea Evans on call - gastroenterology for best contact person 05/05/2022 2:57 PM           HPI:   Anna [name] is a 66 y.o. female with a history of fallopian tube carcinoma status post diagnosis and debulking therapy in 2018 who has recently started [facility] for metastatic disease progression, about 3 weeks ago.  Dr. Benay Spice is her oncologist.  She presented to the hospital yesterday with nausea and vomiting and weakness and was found to have lactic acidosis, acute kidney injury, worsening liver function tests and an elevated INR and imaging showing pulmonary nodules and growth of liver metastases and massive hepatomegaly.  She has been treated with antibiotics and IV fluids and her biochemical parameters are a little better in some respects but the LFTs and bilirubin continue to climb.  Her INR went from 4-3.3 with holding Xarelto.  There has been no sign of bleeding.  She has been tolerating some food since admission.  Lower abdominal pain has been a problem as well as increasing weakness for a number of weeks.  No signs of GI bleeding. Past Medical History:  Diagnosis Date   Anemia    Anxiety    Arthritis    Finger   Depression    Diverticulosis    Fallopian tube carcinoma, left ([place]) 2018   Family history of adverse reaction to anesthesia    Mother has extreme [place] with anesthesia   Family  history of breast cancer    Fatty liver    Noted on CT scan   Genetic testing 11/07/2017   GERD (gastroesophageal reflux disease)    Heart murmur    Hx of   History of chicken pox    Hydronephrosis, left    Hypercholesterolemia    Hypertension    Hypothyroidism    Macular degeneration disease    Pneumonia    Pulmonary embolism ([place]) 2020   Retinitis pigmentosa    Sleep apnea    No longer wears cpap    Past Surgical History:  Procedure Laterality Date   ABDOMINAL HYSTERECTOMY     Dr. Denman [place] 07/17/17   [place] Right 2000   [place]     x 2   COLONOSCOPY     CYSTOSCOPY W/ URETERAL STENT PLACEMENT Bilateral 07/18/2021   Procedure: CYSTOSCOPY WITH BILATERAL RETROGRADE PYELOGRAM/ LEFT URETERAL STENT PLACEMENT;  Surgeon: Ardis Hughs, MD;  Location: [facility];  Service: Urology;  Laterality: Bilateral;   CYSTOSCOPY W/ URETERAL STENT PLACEMENT Left 12/28/2021   Procedure: CYSTOSCOPY WITH BILATURIARETROGRADE PYELOGRAM LEFT URETERAL STENT EXCHANGE, URETEROSCOPY;  Surgeon: Ardis Hughs, MD;  Location: [facility];  Service: Urology;  Laterality: Left;  left stent removed   CYSTOSCOPY W/ URETERAL STENT PLACEMENT Left 03/07/2022   Procedure: CYSTOSCOPY WITH LEFT STENT REMOVAL AND INSERTION OF LEFT METALLIC URETERAL STENT;  Surgeon: Ardis Hughs, MD;  Location: [facility];  Service: Urology;  Laterality: Left;  NEED 45 MINS FOR CASE   EYE SURGERY Bilateral 2008   Lasix eye srg   IR IMAGING GUIDED PORT INSERTION  08/26/2019   LAPAROSCOPY N/A 07/17/2017   Procedure: LAPAROSCOPY DIAGNOSTIC;  Surgeon: Everitt Amber, MD;  Location: [facility];  Service: Gynecology;  Laterality: N/A;   LAPAROTOMY N/A 07/17/2017   Procedure: EXPLORATORY LAPAROTOMY;  Surgeon: Everitt Amber, MD;  Location: [facility];  Service: Gynecology;  Laterality: N/A;   OMENTECTOMY N/A 07/17/2017   Procedure: OMENTECTOMY WITH RADICAL TUMOR DEBULKING;  Surgeon: Everitt Amber, MD;  Location: [facility];  Service: Gynecology;  Laterality: N/A;   TUBAL LIGATION  1985   URETEROSCOPY  12/28/2021   Procedure: URETEROSCOPY;  Surgeon: Ardis Hughs, MD;  Location: [facility];  Service: Urology;;    Family History  Problem Relation Age of Onset   Hyperlipidemia Mother    Hypertension Mother    Alzheimer's disease Mother    Arthritis Father    Hyperlipidemia Father    Heart disease Father        first MI age 79   Hypertension Father    Hyperlipidemia Brother    Heart disease Brother         heart disease dx at a youg age   Hypertension Brother    Colon polyps Brother    Alzheimer's disease Maternal Aunt    Hyperlipidemia Maternal Uncle    Heart disease Maternal Uncle    Sudden death Maternal Uncle 33       massive heart attack in doctors office   Arthritis Maternal Grandmother    Hyperlipidemia Maternal Grandmother    Stroke Maternal Grandmother    Hypertension Maternal Grandmother    Alzheimer's disease Maternal Grandmother    Alcohol abuse Maternal Grandfather    Hypertension Maternal Grandfather    Diabetes Maternal Grandfather    Cancer Paternal Grandmother        Ovarian   Hypertension Paternal  Grandmother    Alcohol abuse Paternal Grandfather    Hyperlipidemia Paternal Grandfather    Heart disease Paternal Grandfather    Hypertension Paternal Grandfather    Breast cancer Cousin        pat cousin   Colon cancer Neg Hx    Rectal cancer Neg Hx    Stomach cancer Neg Hx     Social History   Tobacco Use   Smoking status: Never   Smokeless tobacco: Never  Vaping Use   Vaping Use: Never used  Substance Use Topics   Alcohol use: No    Alcohol/week: 0.0 standard drinks of alcohol   Drug use: No    Prior to Admission medications   Medication Sig Start Date End Date Taking? Authorizing Provider  amLODipine (NORVASC) 5 MG tablet Take 1 tablet (5 mg total) by mouth daily. 01/25/22  Yes Einar Pheasant, MD  buPROPion (WELLBUTRIN XL) 300 MG 24 hr tablet Take 1 tablet by mouth once daily Patient taking differently: Take 300 mg by mouth daily. 04/16/22  Yes Einar Pheasant, MD  EPINEPHrine (EPIPEN 2-PAK) 0.3 mg/0.3 mL IJ SOAJ injection Inject 0.3 mg into the muscle as needed for anaphylaxis. 10/19/21  Yes Einar Pheasant, MD  HYDROcodone-acetaminophen (NORCO) 5-325 MG tablet Take 1 tablet by mouth every 4 (four) hours as needed for moderate pain. 04/23/22  Yes Ladell Pier, MD  levothyroxine (SYNTHROID) 88 MCG tablet Take 1 tablet (88 mcg total) by mouth  daily. 10/25/21  Yes Einar Pheasant, MD  Multiple Vitamins-Minerals [facility] ADULT 50+) CAPS Take 1 tablet by mouth daily. 04/07/21  Yes [provider]  omeprazole (PRILOSEC) 20 MG capsule Take 20 mg by mouth daily.   Yes [provider]  ondansetron (ZOFRAN) 8 MG tablet Take 1 tablet (8 mg total) by mouth every 8 (eight) hours as needed for nausea or vomiting. 05/29/21  Yes Ladell Pier, MD  pantoprazole (PROTONIX) 20 MG tablet Take 20 mg by mouth daily.   Yes [provider]  rosuvastatin (CRESTOR) 5 MG tablet Take 1 tablet by mouth once daily Patient taking differently: Take 5 mg by mouth daily. 04/16/22  Yes Einar Pheasant, MD  sorbitol 70 % solution Take 1 Tablespoon (15 ml) every 4 hours until bowel movement. After bowel movement one time daily 04/23/22  Yes Sherrill, Izola Price, MD  XARELTO 20 MG TABS tablet TAKE 1 TABLET BY MOUTH ONCE DAILY WITH SUPPER Patient taking differently: 20 mg daily with supper. 01/18/22  Yes Ladell Pier, MD  acyclovir (ZOVIRAX) 400 MG tablet Take one tablet tid prn flares Patient not taking: Reported on 05/09/2022 10/19/21   Einar Pheasant, MD  lidocaine-prilocaine (EMLA) cream Apply 1 application topically as directed. Apply 1 hour prior to port stick and cover with plastic wrap Patient not taking: Reported on 05/08/2022 10/19/21   Ladell Pier, MD  prochlorperazine (COMPAZINE) 10 MG tablet Take 1 tablet (10 mg total) by mouth every 6 (six) hours as needed for nausea or vomiting. 03/18/22   Ladell Pier, MD    Current Facility-Administered Medications  Medication Dose Route Frequency Provider Last Rate Last Admin   0.9 %  sodium chloride infusion   Intravenous Continuous Shalhoub, Sherryll Burger, MD 125 mL/hr at 05/05/22 0022 New Bag at 05/05/22 0022   acetaminophen (TYLENOL) tablet 325 mg  325 mg Oral Q6H PRN Vernelle Emerald, MD       Or   acetaminophen (TYLENOL) suppository 325 mg  325 mg Rectal Q6H  PRN Vernelle Emerald, MD        buPROPion (WELLBUTRIN XL) 24 hr tablet 300 mg  300 mg Oral Daily Shalhoub, Sherryll Burger, MD   300 mg at 05/05/22 1014   Chlorhexidine Gluconate Cloth 2 % PADS 6 each  6 each Topical Daily Shalhoub, Sherryll Burger, MD       feeding supplement (ENSURE ENLIVE / ENSURE PLUS) liquid 237 mL  237 mL Oral BID BM Shalhoub, Sherryll Burger, MD       hydrALAZINE (APRESOLINE) injection 10 mg  10 mg Intravenous Q6H PRN Shalhoub, Sherryll Burger, MD       HYDROcodone-acetaminophen (NORCO/VICODIN) 5-325 MG per tablet 1 tablet  1 tablet Oral Q4H PRN Shalhoub, Sherryll Burger, MD   1 tablet at 05/05/22 0813   levothyroxine (SYNTHROID) tablet 88 mcg  88 mcg Oral Q0600 Vernelle Emerald, MD   88 mcg at 05/05/22 0720   ondansetron (ZOFRAN) tablet 4 mg  4 mg Oral Q6H PRN Vernelle Emerald, MD       Or   ondansetron [facility]) injection 4 mg  4 mg Intravenous Q6H PRN Shalhoub, Sherryll Burger, MD   4 mg at 05/05/22 0820   Oral care mouth rinse  15 mL Mouth Rinse PRN Shalhoub, Sherryll Burger, MD       pantoprazole (PROTONIX) EC tablet 40 mg  40 mg Oral Daily Shalhoub, Sherryll Burger, MD   40 mg at 05/05/22 1014   piperacillin-tazobactam (ZOSYN) IVPB 3.375 g  3.375 g Intravenous Q8H Shalhoub, Sherryll Burger, MD 12.5 mL/hr at 05/05/22 1232 Infusion Verify at 05/05/22 1232   polyethylene glycol (MIRALAX / GLYCOLAX) packet 17 g  17 g Oral Daily PRN Vernelle Emerald, MD       Facility-Administered Medications Ordered in Other Encounters  Medication Dose Route Frequency Provider Last Rate Last Admin   sodium chloride flush (NS) 0.9 % injection 10 mL  10 mL Intracatheter PRN Ladell Pier, MD   10 mL at 07/06/21 0931    Allergies as of 04/27/2022 - Review Complete 04/24/2022  Allergen Reaction Noted   Bee venom Anaphylaxis 07/01/2017   Fire ant Anaphylaxis 06/19/2020     Review of Systems:    This is positive for those things mentioned in the HPI o/w negative       Physical Exam:  Vital signs in last 24 hours: Temp:  [97.5 F (36.4 C)-98.2 F (36.8 C)]  97.5 F (36.4 C) (06/18 0956) Pulse Rate:  [86-101] 88 (06/18 1210) Resp:  [13-25] 18 (06/18 1210) BP: (100-130)/(49-80) 128/49 (06/18 1205) SpO2:  [94 %-100 %] 100 % (06/18 1210) Weight:  [72 kg-75.3 kg] 75.3 kg (06/18 0400)    General:  Chronically ill NAD Eyes:  icteric.  Lungs: Clear to auscultation bilaterally. Heart:   S1S2, no rubs, murmurs, gallops. Abdomen:  Obese, soft, non-tender,  BS+.  Extremities:   tr edema Skin   no rash, mild jaundice Neuro:  A&O x 3. No asterixis Psych:  appropriate mood and  Affect.   Data Reviewed:   LAB RESULTS: Recent Labs    05/17/2022 1620 05/05/22 0848  WBC 10.9* 11.8*  HGB 9.8* 9.6*  HCT 31.2* 31.6*  PLT 134* 128*   BMET Recent Labs    05/13/2022 1620 05/05/22 0848  NA 130* 131*  K 4.6 4.4  CL 96* 98  CO2 17* 17*  GLUCOSE 55* 62*  BUN 34* 39*  CREATININE 1.64* 1.87*  CALCIUM 8.0* 8.0*   LFT Recent Labs  05/05/22 0848  PROT 6.6  ALBUMIN 1.9*  AST 772*  ALT 206*  ALKPHOS 633*  BILITOT 8.0*  BILIDIR 5.0*  IBILI 3.0*   PT/INR Recent Labs    05/17/2022 2025 05/05/22 0848  LABPROT 38.4* 33.6*  INR 4.0* 3.3*    STUDIES: Images viewed CT Angio Chest PE W/Cm &/Or Wo Cm  Result Date: 04/23/2022 CLINICAL DATA:  Pulmonary embolism (PE) suspected, high prob; Nausea/vomiting Sepsis Abdominal pain, acute, nonlocalized. History of metastatic fallopian tube carcinoma EXAM: CT ANGIOGRAPHY CHEST CT ABDOMEN AND PELVIS WITH CONTRAST TECHNIQUE: Multidetector CT imaging of the chest was performed using the standard protocol during bolus administration of intravenous contrast. Multiplanar CT image reconstructions and MIPs were obtained to evaluate the vascular anatomy. Multidetector CT imaging of the abdomen and pelvis was performed using the standard protocol during bolus administration of intravenous contrast. RADIATION DOSE REDUCTION: This exam was performed according to the departmental dose-optimization program which includes  automated exposure control, adjustment of the mA and/or kV according to patient size and/or use of iterative reconstruction technique. CONTRAST:  16m OMNIPAQUE IOHEXOL 350 MG/ML SOLN COMPARISON:  CT 03/18/2022, 04/09/2022 FINDINGS: CTA CHEST FINDINGS Cardiovascular: Right chest wall port in place. Satisfactory opacification of the pulmonary arteries to the segmental level. Evaluation is slightly degraded by respiratory motion artifact. No evidence of pulmonary embolism. Thoracic aorta is nonaneurysmal. Mild atherosclerotic calcification of the aorta and coronary arteries. Normal heart size. No pericardial effusion. Elevation of the right hemidiaphragm with mass effect upon the heart and mediastinum. Mediastinum/Nodes: No pathologically enlarged axillary or mediastinal lymph nodes. Increasing soft tissue density in the bilateral hilar regions could be reactive or represent nodal metastatic disease. Trachea and esophagus within normal limits. Lungs/Pleura: Innumerable bilateral pulmonary nodules which appear increasing in size and number compared to the previous CT. Atelectasis within the right lung base and lingula. No pleural effusion or pneumothorax. Musculoskeletal: No chest wall abnormality. No acute or significant osseous findings. Review of the MIP images confirms the above findings. CT ABDOMEN and PELVIS FINDINGS Hepatobiliary: Innumerable masses throughout the liver compatible with known metastatic disease. Liver is enlarged measuring nearly 25 cm in length and has increased in size from prior suggesting progression of underlying metastases. Gallbladder is decompressed. Pancreas: Unremarkable. No pancreatic ductal dilatation or surrounding inflammatory changes. Spleen: Normal in size without focal abnormality. Adrenals/Urinary Tract: Unremarkable adrenal glands. Left-sided nephroureteral stent remains in place. Unchanged appearance of the kidneys. No renal stone or hydronephrosis. Urinary bladder is  incompletely distended. Stomach/Bowel: Stomach is within normal limits. No evidence of bowel wall thickening, distention, or inflammatory changes. Vascular/Lymphatic: Scattered aortoiliac atherosclerotic calcifications without aneurysm. No abdominopelvic lymphadenopathy is identified. Reproductive: Abnormal soft tissue in the left adnexal region abutting the distal left ureter appears increased in size on today's exam measuring approximately 4.6 x 2.7 cm (series 11, image 82), previously measured 3.7 x 2.4 cm on 03/18/2022. Adjacent soft tissue nodule measures up to 1.1 cm (series 11, image 78), previously 0.9 cm. Prior hysterectomy. No right adnexal abnormality. Other: Small volume ascites within the pelvis, increased. Small peritoneal nodule in the left upper quadrant measures 0.9 cm (series 11, image 52), previously 0.7 cm. Small ventral abdominal wall hernia containing only fat. No pneumoperitoneum. Musculoskeletal: No acute or significant osseous findings. Review of the MIP images confirms the above findings. IMPRESSION: CTA chest: 1. No evidence of pulmonary embolism. 2. Innumerable bilateral pulmonary nodules which appear increasing in size and number compared to the previous CT, consistent with progressive pulmonary metastatic disease.  3. Increasing soft tissue density in the bilateral hilar regions could be reactive or represent nodal metastatic disease. CT abdomen pelvis: 1. Enlarging liver containing innumerable masses compatible with progressive hepatic metastatic disease. 2. Interval increase in size of left adnexal soft tissue mass abutting the distal left ureter, consistent known malignancy. 3. Slight interval increase in size of a small peritoneal nodule in the left upper quadrant, most compatible with metastatic disease. 4. Small volume ascites within the pelvis, increased. Aortic Atherosclerosis (ICD10-I70.0). Electronically Signed   By: Davina Poke D.O.   On: 04/18/2022 19:26   CT ABDOMEN  PELVIS W CONTRAST  Result Date: 05/10/2022 CLINICAL DATA:  Pulmonary embolism (PE) suspected, high prob; Nausea/vomiting Sepsis Abdominal pain, acute, nonlocalized. History of metastatic fallopian tube carcinoma EXAM: CT ANGIOGRAPHY CHEST CT ABDOMEN AND PELVIS WITH CONTRAST TECHNIQUE: Multidetector CT imaging of the chest was performed using the standard protocol during bolus administration of intravenous contrast. Multiplanar CT image reconstructions and MIPs were obtained to evaluate the vascular anatomy. Multidetector CT imaging of the abdomen and pelvis was performed using the standard protocol during bolus administration of intravenous contrast. RADIATION DOSE REDUCTION: This exam was performed according to the departmental dose-optimization program which includes automated exposure control, adjustment of the mA and/or kV according to patient size and/or use of iterative reconstruction technique. CONTRAST:  19m OMNIPAQUE IOHEXOL 350 MG/ML SOLN COMPARISON:  CT 03/18/2022, 04/09/2022 FINDINGS: CTA CHEST FINDINGS Cardiovascular: Right chest wall port in place. Satisfactory opacification of the pulmonary arteries to the segmental level. Evaluation is slightly degraded by respiratory motion artifact. No evidence of pulmonary embolism. Thoracic aorta is nonaneurysmal. Mild atherosclerotic calcification of the aorta and coronary arteries. Normal heart size. No pericardial effusion. Elevation of the right hemidiaphragm with mass effect upon the heart and mediastinum. Mediastinum/Nodes: No pathologically enlarged axillary or mediastinal lymph nodes. Increasing soft tissue density in the bilateral hilar regions could be reactive or represent nodal metastatic disease. Trachea and esophagus within normal limits. Lungs/Pleura: Innumerable bilateral pulmonary nodules which appear increasing in size and number compared to the previous CT. Atelectasis within the right lung base and lingula. No pleural effusion or  pneumothorax. Musculoskeletal: No chest wall abnormality. No acute or significant osseous findings. Review of the MIP images confirms the above findings. CT ABDOMEN and PELVIS FINDINGS Hepatobiliary: Innumerable masses throughout the liver compatible with known metastatic disease. Liver is enlarged measuring nearly 25 cm in length and has increased in size from prior suggesting progression of underlying metastases. Gallbladder is decompressed. Pancreas: Unremarkable. No pancreatic ductal dilatation or surrounding inflammatory changes. Spleen: Normal in size without focal abnormality. Adrenals/Urinary Tract: Unremarkable adrenal glands. Left-sided nephroureteral stent remains in place. Unchanged appearance of the kidneys. No renal stone or hydronephrosis. Urinary bladder is incompletely distended. Stomach/Bowel: Stomach is within normal limits. No evidence of bowel wall thickening, distention, or inflammatory changes. Vascular/Lymphatic: Scattered aortoiliac atherosclerotic calcifications without aneurysm. No abdominopelvic lymphadenopathy is identified. Reproductive: Abnormal soft tissue in the left adnexal region abutting the distal left ureter appears increased in size on today's exam measuring approximately 4.6 x 2.7 cm (series 11, image 82), previously measured 3.7 x 2.4 cm on 03/18/2022. Adjacent soft tissue nodule measures up to 1.1 cm (series 11, image 78), previously 0.9 cm. Prior hysterectomy. No right adnexal abnormality. Other: Small volume ascites within the pelvis, increased. Small peritoneal nodule in the left upper quadrant measures 0.9 cm (series 11, image 52), previously 0.7 cm. Small ventral abdominal wall hernia containing only fat. No pneumoperitoneum. Musculoskeletal: No acute  or significant osseous findings. Review of the MIP images confirms the above findings. IMPRESSION: CTA chest: 1. No evidence of pulmonary embolism. 2. Innumerable bilateral pulmonary nodules which appear increasing in size  and number compared to the previous CT, consistent with progressive pulmonary metastatic disease. 3. Increasing soft tissue density in the bilateral hilar regions could be reactive or represent nodal metastatic disease. CT abdomen pelvis: 1. Enlarging liver containing innumerable masses compatible with progressive hepatic metastatic disease. 2. Interval increase in size of left adnexal soft tissue mass abutting the distal left ureter, consistent known malignancy. 3. Slight interval increase in size of a small peritoneal nodule in the left upper quadrant, most compatible with metastatic disease. 4. Small volume ascites within the pelvis, increased. Aortic Atherosclerosis (ICD10-I70.0). Electronically Signed   By: Davina Poke D.O.   On: 04/18/2022 19:26   DG Chest 2 View  Result Date: 05/10/2022 CLINICAL DATA:  Shortness of breath and weakness. Vomiting. History of fallopian tube carcinoma as patient has not had radiation/chemotherapy in 6 weeks. EXAM: CHEST - 2 VIEW COMPARISON:  Chest x-ray 01/13/2017 and chest CT 04/09/2022 FINDINGS: Right-sided Port-A-Cath has tip over the SVC. Lungs are hypoinflated with mild elevation the right hemidiaphragm. There is linear atelectasis over the right midlung and left base. No evidence of effusion. Evidence of patient's known multiple pulmonary nodules left worse than right compatible with known pulmonary metastatic disease. Cardiomediastinal silhouette and remainder of the exam is unchanged. IMPRESSION: 1. Hypoinflation with mild linear atelectasis over the right midlung and left base. 2. Known pulmonary metastatic disease. Electronically Signed   By: Marin Olp M.D.   On: 04/28/2022 16:40        Thanks   LOS: 1 day   '@[place]'$  Simonne Maffucci, MD, [facility] @  05/05/2022, 2:48 PM

## 2022-05-05 NOTE — Progress Notes (Signed)
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  Patient has been brought in by EMS from home due to complaints of increasing weakness shortness breath and vomiting.  CT imaging of the chest abdomen and pelvis once again redemonstrated innumerable bilateral areas of metastatic disease in addition to exhibiting an enlarging liver due to innumerable masses due to progressive hepatic metastatic disease  Assessment and Plan: * Complicated UTI (urinary tract infection) Patient presenting with evidence of leukocytosis, worsening abdominal pain and substantial lactic acidosis Urinalysis appears to be suggestive of urinary tract infection, exam reveals lower abdominal tenderness. Pt now continued on empiric Zosyn F/u on urine and blood cultures  Lactic acidosis Lactic acidosis might be secondary to volume depletion and underlying infection but it is also quite possible that the patient is suffering from severe lactic acidosis due to rapidly progressive diffuse liver injury due to profound metastatic disease Treating underlying infection with intravenous antibiotics Received IVF hydration  Acute liver failure without hepatic coma Patient is suffering from  diffuse hepatocellular injury due to rapidly progressive metastatic disease Patient is exhibiting marked derangement of hepatic function enzymes,  markedly elevated INR beyond what you would expect with regular Xarelto use, significant lactic acidosis and mild hyperammonemia all which are likely secondary at least in part due to this profound hepatocellular injury Xarelto initially held due to increased bleeding risk Appreciate input by GI. Liver is largely replaced by cancer and pt will most likely succumb to liver failure within weeks if not less. Recommendations for further goals of care with Oncology  Acute renal failure superimposed on stage 3a chronic kidney disease ([place]) Patient exhibiting evidence of acute kidney injury secondary to volume depletion and urinary tract infection No evidence of new hydronephrosis on CT imaging of the abdomen, left ureteral stent remains in place Baseline Cr of 1.1 Hydrating patient with intravenous isotonic fluids. Treating suspected concurrent urinary tract infection Recheck bmet in AM  Elevated troponin level not due myocardial infarction Slightly elevated serial troponins with flat trajectory of elevation Patient is chest pain-free  Malignant neoplasm of fallopian tube ([place]) Rapidly progressive metastatic disease with both pulmonary and hepatic manifestations Considering derangement of liver function patient's prognosis is exceedingly poor Patient follows with Dr. Benay Spice  History of pulmonary embolism Holding home regimen of Xarelto for now due to markedly elevated INR in the setting of diffuse liver injury with concern for risk of spontaneous bleeding  Essential hypertension Holding home antihypertensives as patient is currently exhibiting low normal blood pressures BP stable at this time  Hypothyroidism Cont home regimen of Synthroid  GERD without esophagitis Continuing home regimen of daily PPI therapy.      Subjective: Reports nausea this AM. Feels constipated  Physical Exam: Vitals:   05/05/22 1200 05/05/22 1205 05/05/22 1210 05/05/22 1448  BP:  (!) 128/49    Pulse: 87  88 88   Resp: (!) 21 (!) 22 18   Temp:    97.7 F (36.5 C)  TempSrc:    Oral  SpO2: 100% 100% 100%   Weight:      Height:       General exam: Awake, laying in bed, in nad Respiratory system: Normal respiratory effort, no wheezing Cardiovascular system: regular rate, s1, s2 Gastrointestinal system: Soft, mildly distended, positive BS Central nervous system: CN2-12 grossly intact, strength intact Extremities: Perfused, no clubbing Skin: Normal skin turgor, no notable skin lesions seen Psychiatry: Mood normal // no visual hallucinations   Data Reviewed:  Labs reviewed: Na 131, Cr 1.87, Alk phos 633, AST 772, ALT 206, TB 8.0   Family Communication: Pt in room, pt's son at bedside  Disposition: Status is: Inpatient Remains inpatient appropriate because: Severity of illness  Planned Discharge Destination: Home     Author: Marylu Lund, MD 05/05/2022 3:11 PM  For on call review [email or web address].

## 2022-05-05 NOTE — Hospital Course (Signed)
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  Patient has been brought in by EMS from home due to complaints of increasing weakness shortness breath and vomiting.  CT imaging of the chest abdomen and pelvis once again redemonstrated innumerable bilateral areas of metastatic disease in addition to exhibiting an enlarging liver due to innumerable masses due to progressive hepatic metastatic disease

## 2022-05-05 NOTE — Consult Note (Signed)
Consultation Note Date: 05/05/2022   Patient Name: Anna [name]  DOB: Feb 18, 1956  MRN: [id number]  Age / Sex: 66 y.o., female  PCP: Einar Pheasant, MD Referring Physician: Donne Hazel, MD  Reason for Consultation: Establishing goals of care  HPI/Patient Profile: 66 y.o. 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  She was brought in from EMS due to weakness, shortness of breath, and vomiting.  Work-up in the ED revealed worsening metastatic disease with innumerable bilateral pulmonary areas of involvement in addition to enlarging liver secondary to metastatic disease.  Of note, she was recently started on Keytruda with next dose being due on 6/20.  Palliative consulted for goals of care.   Clinical Assessment and Goals of Care: Chart reviewed including personal review of pertinent labs and imaging.  I met today with Anna [name].  Her brother and sister-in-law were present at the bedside.  I introduced palliative care as specialized medical care for people living with serious illness. It focuses on providing relief from the symptoms and stress of a serious illness. The goal is to improve quality of life for both the patient and the family.  We had short discussion regarding her clinical course since admission and she reports that she was able to discuss further with GI doctor and that she has understanding of what is going on.  She then noted that she would like to wait to have a longer meeting with our team when her husband and children could be present.  We discussed that she is followed with Dr. Benay Spice for a long time and would really like  to have his input prior to making decisions about long-term goals of care.  I suggested setting up a meeting tomorrow after she had a chance to talk with Dr. Benay Spice to discuss further with her husband and children and she was agreeable.  We also talked about symptom management and she reports that she has been having abdominal pain but it is fairly well controlled with current medication.  MAR review reveals she has had 1 dose of hydrocodone/APAP since admission.  She declined need for consideration for any other medications for pain at this time.  We also discussed her nausea and early satiety.  Reports this has been getting worse lately.  She does get partial relief from Zofran.  We talked about other antiemetics and how it may be beneficial to have another option available if the Zofran is not working as different antiemetics have different mechanism of action and something that works differently may be more beneficial if she is not getting relief from Zofran.  Discussed other antiemetics and she reports being on Compazine in the past.  She does not think that it helped more than the Zofran does currently, however, she is open to having it on board in case it is  needed and reports understanding that it would work differently than Zofran and so it may be worthwhile trying it if she is not getting sufficient relief from currently ordered Zofran.    SUMMARY OF RECOMMENDATIONS   - Agree with DNR - Pain, cancer related: Reports well controlled.  Continue same. - Nausea: Currently ordered Zofran as needed and she reports some improvement with this.  I suspect a lot of this is related to mass effect as she is not really able to eat or drink anything but in reviewing imaging her stomach is significantly compressed by large volume of metastasis involving the liver.  Discussed potential for addition of other antiemetics in case Zofran is ineffective and she reports she has had Compazine in the past.  Discussed  that this has a different mechanism of action than Zofran and I added it in case Zofran is ineffective to give her another option to hopefully better control her nausea.   - Anna [name] would like to discuss further with her oncologist, Dr. Benay Spice, in the morning prior to meeting with our team.  She would also like for her husband and children to be present at time of palliative care meeting. -While I am not on service, I will ask another member of the palliative care team to follow-up tomorrow with Anna [name] after she has a chance to discuss with Dr. Benay Spice.  Code Status/Advance Care Planning: DNR  Prognosis:  Guarded/poor.  Await input from oncology.  If she is not a candidate, or if she decided to forego further disease modifying therapy, her expected prognosis of her disease progresses along its natural course will be less than 6 months and she should qualify for hospice services if so desired in the future.  Discharge Planning: To Be Determined      Primary Diagnoses: Present on Admission:  Lactic acidosis  Complicated UTI (urinary tract infection)  GERD without esophagitis  Hypothyroidism  Essential hypertension  History of pulmonary embolism  Malignant neoplasm of fallopian tube (H[facility])  Acute renal failure superimposed on stage 3a chronic kidney disease (H[facility])  Elevated troponin level not due myocardial infarction  Acute liver failure without hepatic coma   I have reviewed the medical record, interviewed the patient and family, and examined the patient. The following aspects are pertinent.  Past Medical History:  Diagnosis Date   Anemia    Anxiety    Arthritis    Finger   Depression    Diverticulosis    Fallopian tube carcinoma, left ([place]) 2018   Family history of adverse reaction to anesthesia    Mother has extreme [place] with anesthesia   Family history of breast cancer    Fatty liver    Noted on CT scan   Genetic testing 11/07/2017   GERD (gastroesophageal  reflux disease)    Heart murmur    Hx of   History of chicken pox    Hydronephrosis, left    Hypercholesterolemia    Hypertension    Hypothyroidism    Macular degeneration disease    Pneumonia    Pulmonary embolism ([place]) 2020   Retinitis pigmentosa    Sleep apnea    No longer wears cpap   Social History   Socioeconomic History   Marital status: Married    Spouse name: Not on file   Number of children: 2   Years of education: Not on file   Highest education level: Not on file  Occupational History   Not on file  Tobacco  Use   Smoking status: Never   Smokeless tobacco: Never  Vaping Use   Vaping Use: Never used  Substance and Sexual Activity   Alcohol use: No    Alcohol/week: 0.0 standard drinks of alcohol   Drug use: No   Sexual activity: Yes    Birth control/protection: Surgical  Other Topics Concern   Not on file  Social History Narrative   Married to Addison   2 children   No alcohol tobacco or drug use   Social Determinants of Radio broadcast assistant Strain: Not on file  Food Insecurity: Not on file  Transportation Needs: Not on file  Physical Activity: Not on file  Stress: Not on file  Social Connections: Not on file   Family History  Problem Relation Age of Onset   Hyperlipidemia Mother    Hypertension Mother    Alzheimer's disease Mother    Arthritis Father    Hyperlipidemia Father    Heart disease Father        first MI age 57   Hypertension Father    Hyperlipidemia Brother    Heart disease Brother        heart disease dx at a youg age   Hypertension Brother    Colon polyps Brother    Alzheimer's disease Maternal Aunt    Hyperlipidemia Maternal Uncle    Heart disease Maternal Uncle    Sudden death Maternal Uncle 2       massive heart attack in doctors office   Arthritis Maternal Grandmother    Hyperlipidemia Maternal Grandmother    Stroke Maternal Grandmother    Hypertension Maternal Grandmother    Alzheimer's disease Maternal  Grandmother    Alcohol abuse Maternal Grandfather    Hypertension Maternal Grandfather    Diabetes Maternal Grandfather    Cancer Paternal Grandmother        Ovarian   Hypertension Paternal Grandmother    Alcohol abuse Paternal Grandfather    Hyperlipidemia Paternal Grandfather    Heart disease Paternal Grandfather    Hypertension Paternal Grandfather    Breast cancer Cousin        pat cousin   Colon cancer Neg Hx    Rectal cancer Neg Hx    Stomach cancer Neg Hx    Scheduled Meds:  buPROPion  300 mg Oral Daily   Chlorhexidine Gluconate Cloth  6 each Topical Daily   feeding supplement  237 mL Oral BID BM   levothyroxine  88 mcg Oral Q0600   pantoprazole  40 mg Oral Daily   Continuous Infusions:  piperacillin-tazobactam (ZOSYN)  IV Stopped (05/05/22 1605)   PRN Meds:.acetaminophen **OR** acetaminophen, hydrALAZINE, HYDROcodone-acetaminophen, ondansetron **OR** ondansetron (ZOFRAN) IV, mouth rinse, polyethylene glycol Medications Prior to Admission:  Prior to Admission medications   Medication Sig Start Date End Date Taking? Authorizing Provider  amLODipine (NORVASC) 5 MG tablet Take 1 tablet (5 mg total) by mouth daily. 01/25/22  Yes Einar Pheasant, MD  buPROPion (WELLBUTRIN XL) 300 MG 24 hr tablet Take 1 tablet by mouth once daily Patient taking differently: Take 300 mg by mouth daily. 04/16/22  Yes Einar Pheasant, MD  EPINEPHrine (EPIPEN 2-PAK) 0.3 mg/0.3 mL IJ SOAJ injection Inject 0.3 mg into the muscle as needed for anaphylaxis. 10/19/21  Yes Einar Pheasant, MD  HYDROcodone-acetaminophen (NORCO) 5-325 MG tablet Take 1 tablet by mouth every 4 (four) hours as needed for moderate pain. 04/23/22  Yes Ladell Pier, MD  levothyroxine (SYNTHROID) 88 MCG tablet Take 1 tablet (  88 mcg total) by mouth daily. 10/25/21  Yes Einar Pheasant, MD  Multiple Vitamins-Minerals [facility] ADULT 50+) CAPS Take 1 tablet by mouth daily. 04/07/21  Yes [provider]  omeprazole (PRILOSEC) 20  MG capsule Take 20 mg by mouth daily.   Yes [provider]  ondansetron (ZOFRAN) 8 MG tablet Take 1 tablet (8 mg total) by mouth every 8 (eight) hours as needed for nausea or vomiting. 05/29/21  Yes Ladell Pier, MD  pantoprazole (PROTONIX) 20 MG tablet Take 20 mg by mouth daily.   Yes [provider]  rosuvastatin (CRESTOR) 5 MG tablet Take 1 tablet by mouth once daily Patient taking differently: Take 5 mg by mouth daily. 04/16/22  Yes Einar Pheasant, MD  sorbitol 70 % solution Take 1 Tablespoon (15 ml) every 4 hours until bowel movement. After bowel movement one time daily 04/23/22  Yes Sherrill, Izola Price, MD  XARELTO 20 MG TABS tablet TAKE 1 TABLET BY MOUTH ONCE DAILY WITH SUPPER Patient taking differently: 20 mg daily with supper. 01/18/22  Yes Ladell Pier, MD  acyclovir (ZOVIRAX) 400 MG tablet Take one tablet tid prn flares Patient not taking: Reported on 04/28/2022 10/19/21   Einar Pheasant, MD  lidocaine-prilocaine (EMLA) cream Apply 1 application topically as directed. Apply 1 hour prior to port stick and cover with plastic wrap Patient not taking: Reported on 05/03/2022 10/19/21   Ladell Pier, MD  prochlorperazine (COMPAZINE) 10 MG tablet Take 1 tablet (10 mg total) by mouth every 6 (six) hours as needed for nausea or vomiting. 03/18/22   Ladell Pier, MD   Allergies  Allergen Reactions   Bee Venom Anaphylaxis   Fire Ant Anaphylaxis   Review of Systems Endorses nausea, early satiety, weakness, poor sleep  Physical Exam General: Alert, awake, in no acute distress.   HEENT: No bruits, no goiter, no JVD Heart: Regular rate and rhythm. No murmur appreciated. Lungs: Good air movement, clear Ext: No significant edema Neuro: Grossly intact, nonfocal.    Vital Signs: BP 103/63 (BP Location: Left Wrist)   Pulse 88   Temp 97.7 F (36.5 C) (Oral)   Resp 17   Ht $R'5\' 3"'lD$  (1.6 m)   Wt 75.3 kg   SpO2 99%   BMI 29.41 kg/m  Pain Scale: 0-10   Pain Score: 0-No  pain   SpO2: SpO2: 99 % O2 Device:SpO2: 99 % O2 Flow Rate: .O2 Flow Rate (L/min): 3 L/min  IO: Intake/output summary:  Intake/Output Summary (Last 24 hours) at 05/05/2022 1803 Last data filed at 05/05/2022 1232 Gross per 24 hour  Intake 1489.16 ml  Output 550 ml  Net 939.16 ml    LBM: Last BM Date : 05/05/22 Baseline Weight: Weight: 72 kg Most recent weight: Weight: 75.3 kg     Palliative Assessment/Data:   Flowsheet Rows    Flowsheet Row Most Recent Value  Intake Tab   Referral Department Hospitalist  Unit at Time of Referral ICU  Palliative Care Primary Diagnosis Cancer  Date Notified 05/05/22  Palliative Care Type New Palliative care  Reason for referral Clarify Goals of Care  Date of Admission 04/22/2022  Date first seen by Palliative Care 05/05/22  # of days Palliative referral response time 0 Day(s)  # of days IP prior to Palliative referral 1  Clinical Assessment   Palliative Performance Scale Score 40%  Psychosocial & Spiritual Assessment   Palliative Care Outcomes        Time In: 1600 Time Out:  1650 Time Total: 50 Greater than 50%  of this time was spent counseling and coordinating care related to the above assessment and plan.  Signed by: Micheline Rough, MD   Please contact Palliative Medicine Team phone at [phone] for questions and concerns.  For individual provider: See Shea Evans

## 2022-05-05 NOTE — ED Notes (Signed)
Orange juice and graham crackers provided for pt at bedside.

## 2022-05-06 DIAGNOSIS — I10 Essential (primary) hypertension: Secondary | ICD-10-CM | POA: Diagnosis not present

## 2022-05-06 DIAGNOSIS — R778 Other specified abnormalities of plasma proteins: Secondary | ICD-10-CM | POA: Diagnosis not present

## 2022-05-06 DIAGNOSIS — N390 Urinary tract infection, site not specified: Secondary | ICD-10-CM | POA: Diagnosis not present

## 2022-05-06 LAB — COMPREHENSIVE METABOLIC PANEL
ALT: 258 U/L — ABNORMAL HIGH (ref 0–44)
AST: 997 U/L — ABNORMAL HIGH (ref 15–41)
Albumin: 1.7 g/dL — ABNORMAL LOW (ref 3.5–5.0)
Alkaline Phosphatase: 575 U/L — ABNORMAL HIGH (ref 38–126)
Anion gap: 19 — ABNORMAL HIGH (ref 5–15)
BUN: 52 mg/dL — ABNORMAL HIGH (ref 8–23)
CO2: 14 mmol/L — ABNORMAL LOW (ref 22–32)
Calcium: 7.5 mg/dL — ABNORMAL LOW (ref 8.9–10.3)
Chloride: 99 mmol/L (ref 98–111)
Creatinine, Ser: 3.02 mg/dL — ABNORMAL HIGH (ref 0.44–1.00)
GFR, Estimated: 17 mL/min — ABNORMAL LOW (ref >60)
Glucose, Bld: 32 mg/dL — CL (ref 70–99)
Potassium: 5.7 mmol/L — ABNORMAL HIGH (ref 3.5–5.1)
Sodium: 132 mmol/L — ABNORMAL LOW (ref 135–145)
Total Bilirubin: 8.4 mg/dL — ABNORMAL HIGH (ref 0.3–1.2)
Total Protein: 6 g/dL — ABNORMAL LOW (ref 6.5–8.1)

## 2022-05-06 LAB — URINE CULTURE: Culture: >=100000 — AB

## 2022-05-06 LAB — CBC
HCT: 28.4 % — ABNORMAL LOW (ref 36.0–46.0)
Hemoglobin: 8.6 g/dL — ABNORMAL LOW (ref 12.0–15.0)
MCH: 32.8 pg (ref 26.0–34.0)
MCHC: 30.3 g/dL (ref 30.0–36.0)
MCV: 108.4 fL — ABNORMAL HIGH (ref 80.0–100.0)
Platelets: 103 10*3/uL — ABNORMAL LOW (ref 150–400)
RBC: 2.62 MIL/uL — ABNORMAL LOW (ref 3.87–5.11)
RDW: 27.9 % — ABNORMAL HIGH (ref 11.5–15.5)
WBC: 11.2 10*3/uL — ABNORMAL HIGH (ref 4.0–10.5)
nRBC: 3.9 % — ABNORMAL HIGH (ref 0.0–0.2)

## 2022-05-06 LAB — GLUCOSE, CAPILLARY
Glucose-Capillary: 21 mg/dL — CL (ref 70–99)
Glucose-Capillary: 154 mg/dL — ABNORMAL HIGH (ref 70–99)

## 2022-05-06 MED ORDER — HEPARIN SODIUM (PORCINE) 5000 UNIT/ML IJ SOLN: 5000.0000 [IU] | Freq: Three times a day (TID) | INTRAMUSCULAR | Status: DC

## 2022-05-06 MED ORDER — MUSCLE RUB 10-15 % EX CREA
TOPICAL_CREAM | CUTANEOUS | Status: DC | PRN
Start: 2022-05-06 — End: 2022-05-07
  Filled 2022-05-06: qty 85

## 2022-05-06 MED ORDER — LORAZEPAM 2 MG/ML PO CONC: 1.0000 mg | ORAL | Status: DC | PRN

## 2022-05-06 MED ORDER — MORPHINE SULFATE (PF) 2 MG/ML IV SOLN
2.0000 mg | INTRAVENOUS | Status: DC | PRN
  Administered 2022-05-06: 2 mg via INTRAVENOUS
  Filled 2022-05-06: qty 1

## 2022-05-06 MED ORDER — BIOTENE DRY MOUTH MT LIQD: 15.0000 mL | OROMUCOSAL | Status: DC | PRN

## 2022-05-06 MED ORDER — DEXTROSE 50 % IV SOLN
INTRAVENOUS | Status: AC
  Administered 2022-05-06: 50 mL via INTRAVENOUS
  Filled 2022-05-06: qty 50

## 2022-05-06 MED ORDER — GLYCOPYRROLATE 0.2 MG/ML IJ SOLN: 0.2000 mg | INTRAMUSCULAR | Status: DC | PRN

## 2022-05-06 MED ORDER — SODIUM CHLORIDE 0.9% FLUSH
10.0000 mL | Freq: Two times a day (BID) | INTRAVENOUS | Status: DC
  Administered 2022-05-06: 20 mL
  Administered 2022-05-07: 10 mL

## 2022-05-06 MED ORDER — HYDROMORPHONE HCL 1 MG/ML IJ SOLN
1.0000 mg | INTRAMUSCULAR | Status: DC | PRN
  Administered 2022-05-06 – 2022-05-07 (×3): 1 mg via INTRAVENOUS
  Filled 2022-05-06 (×3): qty 1

## 2022-05-06 MED ORDER — LORAZEPAM 1 MG PO TABS: 1.0000 mg | ORAL_TABLET | ORAL | Status: DC | PRN

## 2022-05-06 MED ORDER — LORAZEPAM 2 MG/ML IJ SOLN: 1.0000 mg | INTRAMUSCULAR | Status: DC | PRN

## 2022-05-06 MED ORDER — DEXTROSE 10 % IV SOLN: INTRAVENOUS | Status: DC

## 2022-05-06 MED ORDER — SODIUM CHLORIDE 0.9% FLUSH: 10.0000 mL | INTRAVENOUS | Status: DC | PRN

## 2022-05-06 MED ORDER — GLYCOPYRROLATE 1 MG PO TABS: 1.0000 mg | ORAL_TABLET | ORAL | Status: DC | PRN

## 2022-05-06 MED ORDER — POLYVINYL ALCOHOL 1.4 % OP SOLN: 1.0000 [drp] | Freq: Four times a day (QID) | OPHTHALMIC | Status: DC | PRN

## 2022-05-06 MED ORDER — DEXTROSE 50 % IV SOLN
1.0000 | Freq: Once | INTRAVENOUS | Status: AC
Start: 2022-05-06 — End: 2022-05-06

## 2022-05-06 MED ORDER — PIPERACILLIN-TAZOBACTAM IN DEX 2-0.25 GM/50ML IV SOLN
2.2500 g | Freq: Three times a day (TID) | INTRAVENOUS | Status: DC
  Filled 2022-05-06: qty 50

## 2022-05-06 NOTE — Progress Notes (Signed)
Call received from RN re: acute 10/10 right upper back pain, did not get relief from morphine. Ordered hydromorphone '1mg'$  q2 prn-follow up visit on symptoms- resting comfortably. She is mostly unresponsive-appears to be declining since my earlier visit. She is waiting on her daughter to arrive from [place], will discontinue D10 infusion after daughter arrives. Discussed this with patient and family- all are in agreement. Given that she is actively transitioning I advise against transferring out of the ICU for her comfort and for family.  Lane Hacker, DO Palliative Medicine

## 2022-05-06 NOTE — Progress Notes (Signed)
At 0600 NT entered room and found patient covered in blood. RN notified and returned to patient's room. Upon further assessment patient and bed was soaked with blood from removal of one of her PIVs. Pt does not recall removing her IV and did not notice that her arm was bleeding. RN notified [facility] NP, CBC order already in, VSS and will continue to monitor for any changes.

## 2022-05-06 NOTE — Progress Notes (Signed)
Daily Progress Note   Patient Name: Anna [name]       Date: 05/06/2022 DOB: 07-May-1956  Age: 66 y.o. MRN#: [id number] Attending Physician: Donne Hazel, MD Primary Care Physician: Einar Pheasant, MD Admit Date: 05/08/2022  Reason for Consultation/Follow-up: Establishing goals of care and Terminal Care  Subjective: Worsening status today. Episodes of severe hypoglycemia, hypotension  Length of Stay: 2  Current Medications: Scheduled Meds:   buPROPion  300 mg Oral Daily   Chlorhexidine Gluconate Cloth  6 each Topical Daily   feeding supplement  237 mL Oral BID BM   levothyroxine  88 mcg Oral Q0600   pantoprazole  40 mg Oral Daily   sodium chloride flush  10-40 mL Intracatheter Q12H    Continuous Infusions:  dextrose 75 mL/hr at 05/06/22 1217   piperacillin-tazobactam (ZOSYN)  IV      PRN Meds: acetaminophen **OR** acetaminophen, hydrALAZINE, HYDROcodone-acetaminophen, Muscle Rub, ondansetron **OR** ondansetron (ZOFRAN) IV, mouth rinse, polyethylene glycol, prochlorperazine, sodium chloride flush  Physical Exam          Vital Signs: BP (!) 91/43   Pulse 70   Temp (!) 96.3 F (35.7 C) (Axillary) Comment: RN notified  Resp 15   Ht 5' 3"  (1.6 m)   Wt 75.3 kg   SpO2 97%   BMI 29.41 kg/m  SpO2: SpO2: 97 % O2 Device: O2 Device: (S) Room Air O2 Flow Rate: O2 Flow Rate (L/min): 2 L/min  Intake/output summary:  Intake/Output Summary (Last 24 hours) at 05/06/2022 1259 Last data filed at 05/06/2022 1041 Gross per 24 hour  Intake 128.73 ml  Output 30 ml  Net 98.73 ml   LBM: Last BM Date : 05/05/22 Baseline Weight: Weight: 72 kg Most recent weight: Weight: 75.3 kg       Palliative Assessment/Data:    Flowsheet Rows    Flowsheet Row Most Recent Value   Intake Tab   Referral Department Hospitalist  Unit at Time of Referral ICU  Palliative Care Primary Diagnosis Cancer  Date Notified 05/05/22  Palliative Care Type New Palliative care  Reason for referral Clarify Goals of Care  Date of Admission 05/08/2022  Date first seen by Palliative Care 05/05/22  # of days Palliative referral response time 0 Day(s)  # of days IP prior to Palliative referral 1  Clinical  Assessment   Palliative Performance Scale Score 40%  Psychosocial & Spiritual Assessment   Palliative Care Outcomes        Patient Active Problem List   Diagnosis Date Noted   Lactic acidosis 58/85/0277   Complicated UTI (urinary tract infection) 05/16/2022   History of pulmonary embolism 05/06/2022   Acute renal failure superimposed on stage 3a chronic kidney disease ([place]) 05/03/2022   Elevated troponin level not due myocardial infarction 05/03/2022   Acute liver failure without hepatic coma 05/06/2022   Hydronephrosis 02/25/2022   Port-A-Cath in place 12/13/2019   Goals of care, counseling/discussion 08/20/2019   History of colon polyps 03/30/2019   Sinusitis 03/21/2019   Enlarged lymph node 03/21/2019   Tick bite of head 03/21/2019   Fatigue 06/25/2018   Genetic testing 11/07/2017   Family history of breast cancer    Malignant neoplasm of fallopian tube ([place]) 07/28/2017   Ovarian cancer ([place]) 07/17/2017   Carcinomatosis ([place]) 07/09/2017   Palpitations 05/15/2016   Elbow tendonitis 05/15/2016   Arm skin lesion, left 12/11/2015   Health care maintenance 05/08/2015   Toenail fungus 08/07/2014   Family history of colonic polyps 06/20/2014   Neck pain 01/04/2014   Obstructive sleep apnea 01/04/2014   Rash 09/28/2013   Arthritis 09/08/2013   Mild depression 09/08/2013   Headache(784.0) 09/08/2013   Retinitis 09/08/2013   GERD without esophagitis 09/08/2013   Abnormal Pap smear of cervix 09/08/2013   Essential hypertension 09/07/2013   Hypercholesterolemia  09/07/2013   Hypothyroidism 09/07/2013   Shortness of breath 09/07/2013    Palliative Care Assessment & Plan   Patient Profile: Metastatic ovarian cancer. Widespread metastatic disease. Multiple courses of chemotherapy.Approaching end of life. Family gathered at bedside.   Assessment: Patient expresses understanding that she is facing end of life. She is calm and peaceful. No distress or pain. Met with her family at bedside-they are grieving appropriately. I answered all of their questions and shared with them the anticipated course.   Recommendations/Plan: Comfort Measures only. Discussed D10 and CBG's will discontinue-she does not feel distress with hypoglycemia. Discussed natural trajectory. She is not stable enough to be moved out of the hospital-ok to move to non-ICU bed. Will use PRN's for pain and dyspnea at EOL. Discussed assessing he for non-verbal signs of pain or suffering. Will bladder scan since no urine output-may need foley, but it is likely from renal failure.  Goals of Care and Additional Recommendations: Limitations on Scope of Treatment: Full Comfort Care  Code Status:    Code Status Orders  (From admission, onward)           Start     Ordered   05/05/22 0408  Do not attempt resuscitation (DNR)  Continuous       Question Answer Comment  In the event of cardiac or respiratory ARREST Do not call a "code blue"   In the event of cardiac or respiratory ARREST Do not perform Intubation, CPR, defibrillation or ACLS   In the event of cardiac or respiratory ARREST Use medication by any route, position, wound care, and other measures to relive pain and suffering. May use oxygen, suction and manual treatment of airway obstruction as needed for comfort.      05/05/22 0407           Code Status History     Date Active Date Inactive Code Status Order ID Comments User Context   05/12/2022 2244 05/05/2022 0407 Full Code [id number]  Shalhoub, Sherryll Burger, MD ED  07/17/2017 1912 07/20/2017 2049 Full Code [id number]  Lahoma Crocker, MD Inpatient      Advance Directive Documentation    Flowsheet Row Most Recent Value  Type of Advance Directive Out of facility DNR (pink MOST or yellow form)  Pre-existing out of facility DNR order (yellow form or pink MOST form) Physician notified to receive inpatient order  "MOST" Form in Place? --       Prognosis:  Hours - Days  Discharge Planning: Anticipated Hospital Death  Care plan was discussed with Patient, Husband, children, RN, Hospitalist.  Thank you for allowing the Palliative Medicine Team to assist in the care of this patient.  Time: 60 minutes     Greater than 50%  of this time was spent counseling and coordinating care related to the above assessment and plan.  Lane Hacker, DO  Please contact Palliative Medicine Team phone at [phone] for questions and concerns.

## 2022-05-06 NOTE — Progress Notes (Signed)
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  Patient has been brought in by EMS from home due to complaints of increasing weakness shortness breath and vomiting.  CT imaging of the chest abdomen and pelvis once again redemonstrated innumerable bilateral areas of metastatic disease in addition to exhibiting an enlarging liver due to innumerable masses due to progressive hepatic metastatic disease  Assessment and Plan: * Complicated UTI (urinary tract infection) Patient presenting with evidence of leukocytosis, worsening abdominal pain and substantial lactic acidosis Urinalysis appeared to be suggestive of urinary tract infection Pt was continued on empiric Zosyn Later transitioned to comfort measures  Lactic acidosis Lactic acidosis might be secondary to volume depletion and underlying infection but it is also quite possible that the patient is suffering from severe lactic acidosis due to rapidly progressive diffuse liver injury due to profound metastatic disease Treating underlying infection with intravenous antibiotics Received IVF hydration Later transitioned to comfort measures  Acute liver failure without hepatic coma Patient is suffering from  diffuse hepatocellular injury due to rapidly progressive metastatic disease Appreciate input by GI and Oncology. Liver is largely replaced by  cancer with decompensation. Later became profoundly hypoglycemic, requiring D10 IVF Very poor prognosis with later transition to comfort measures  Acute renal failure superimposed on stage 3a chronic kidney disease ([facility]) Patient exhibiting evidence of acute kidney injury secondary to volume depletion and urinary tract infection No evidence of new hydronephrosis on CT imaging of the abdomen, left ureteral stent remains in place Later focus on comfort measures  Elevated troponin level not due myocardial infarction Slightly elevated serial troponins with flat trajectory of elevation Pt had remained chest pain free  Malignant neoplasm of fallopian tube ([facility]) Rapidly progressive metastatic disease with both pulmonary and hepatic manifestations Considering derangement of liver function patient's prognosis is exceedingly poor Followed by Dr. Sherrill.  Later transition to full comfort  History of pulmonary embolism Held home regimen of Xarelto due to markedly elevated INR in the setting of diffuse liver injury with concern for risk of spontaneous bleeding Later focus on comfort measures  Essential hypertension Held home anti-hypertensives as patient was currently exhibiting low normal blood pressures Later focus on comfort care  Hypothyroidism Initially continued home regimen of Synthroid Later focus on comfort measures  GERD without esophagitis Pt had been receiving home regimen of daily PPI therapy. Later transition to comfort measures  End of Life Care -Wishes were established to be DNR -With progressive decline and grim prognosis, pt's care was changed to comfort measures   DNR  Hypoalbuminemia  Hyerkalemia      Subjective: Very drowsy this AM  Physical Exam: Vitals:   05/06/22 0700 05/06/22 0800 05/06/22 1000 05/06/22 1100  BP: (!) 106/59 (!) 99/51 (!) 97/56 (!) 91/43  Pulse: 77 74 75 70  Resp: 18 15 19 15  Temp:      TempSrc:      SpO2: 97% 97% 95% 97%   Weight:        Height:       General exam: Conversant, in no acute distress Respiratory system: normal chest rise, clear, no audible wheezing Cardiovascular system: regular rhythm, s1-s2 Gastrointestinal system: Nondistended, nontender, pos BS Central nervous system: No seizures, no tremors Extremities: No cyanosis, no joint deformities Skin: No rashes, no pallor Psychiatry: Affect normal // no auditory hallucinations   Data Reviewed:  Labs reviewed: Na Na 132, K 5.7, Cr 3.02, Alk phos 575, AST 997, ALT 258   Family Communication: Pt in room, pt's son at bedside  Disposition: Status is: Inpatient Remains inpatient appropriate because: Severity of illness  Planned Discharge Destination: Home     Author: Stephen Chiu, MD 05/06/2022 5:34 PM  For on call review [email or web address].  

## 2022-05-06 NOTE — Progress Notes (Signed)
Hypoglycemic Event  CBG: 21  Treatment: D50 50 mL (25 gm)  Symptoms: None  Follow-up CBG: Time:1234 CBG Result:154  Possible Reasons for Event: Inadequate meal intake  Comments/MD notified: Dr. Earlie Counts notified.

## 2022-05-06 NOTE — Progress Notes (Signed)
IP PROGRESS NOTE  Subjective:   Anna [name] is well-known to me with a history of fallopian tube carcinoma, currently being treated with salvage systemic therapy.  She completed a first treatment with pembrolizumab on 04/12/2022.  She presented to the emergency room 05/01/2022 with dyspnea and nausea.  Objective: Vital signs in last 24 hours: Blood pressure (!) 91/43, pulse 70, temperature (!) 96.3 F (35.7 C), temperature source Axillary, resp. rate 15, height _0  (1.6 m), weight 166 lb 0.1 oz (75.3 kg), SpO2 97 %.  Intake/Output from previous day: 06/18 0701 - 06/19 0700 In: 144 [IV Piggyback:144] Out: 330 [Urine:30; Emesis/NG output:300]  Physical Exam:  HEENT: Scleral icterus, no thrush Lungs: Good air movement bilaterally, no respiratory distress Cardiac: Regular rate and rhythm Abdomen: The liver is palpable in the right upper abdomen Extremities: Trace lower leg edema bilaterally   Portacath/PICC-without erythema  Lab Results: Recent Labs    05/05/22 0848 05/06/22 1034  WBC 11.8* 11.2*  HGB 9.6* 8.6*  HCT 31.6* 28.4*  PLT 128* 103*    BMET Recent Labs    04/21/2022 1620 05/05/22 0848  NA 130* 131*  K 4.6 4.4  CL 96* 98  CO2 17* 17*  GLUCOSE 55* 62*  BUN 34* 39*  CREATININE 1.64* 1.87*  CALCIUM 8.0* 8.0*    Lab Results  Component Value Date   CEA1 <1.00 06/27/2017    Studies/Results: CT Angio Chest PE W/Cm &/Or Wo Cm  Result Date: 05/11/2022 CLINICAL DATA:  Pulmonary embolism (PE) suspected, high prob; Nausea/vomiting Sepsis Abdominal pain, acute, nonlocalized. History of metastatic fallopian tube carcinoma EXAM: CT ANGIOGRAPHY CHEST CT ABDOMEN AND PELVIS WITH CONTRAST TECHNIQUE: Multidetector CT imaging of the chest was performed using the standard protocol during bolus administration of intravenous contrast. Multiplanar CT image reconstructions and MIPs were obtained to evaluate the vascular anatomy. Multidetector CT imaging of the abdomen and  pelvis was performed using the standard protocol during bolus administration of intravenous contrast. RADIATION DOSE REDUCTION: This exam was performed according to the departmental dose-optimization program which includes automated exposure control, adjustment of the mA and/or kV according to patient size and/or use of iterative reconstruction technique. CONTRAST:  140m OMNIPAQUE IOHEXOL 350 MG/ML SOLN COMPARISON:  CT 03/18/2022, 04/09/2022 FINDINGS: CTA CHEST FINDINGS Cardiovascular: Right chest wall port in place. Satisfactory opacification of the pulmonary arteries to the segmental level. Evaluation is slightly degraded by respiratory motion artifact. No evidence of pulmonary embolism. Thoracic aorta is nonaneurysmal. Mild atherosclerotic calcification of the aorta and coronary arteries. Normal heart size. No pericardial effusion. Elevation of the right hemidiaphragm with mass effect upon the heart and mediastinum. Mediastinum/Nodes: No pathologically enlarged axillary or mediastinal lymph nodes. Increasing soft tissue density in the bilateral hilar regions could be reactive or represent nodal metastatic disease. Trachea and esophagus within normal limits. Lungs/Pleura: Innumerable bilateral pulmonary nodules which appear increasing in size and number compared to the previous CT. Atelectasis within the right lung base and lingula. No pleural effusion or pneumothorax. Musculoskeletal: No chest wall abnormality. No acute or significant osseous findings. Review of the MIP images confirms the above findings. CT ABDOMEN and PELVIS FINDINGS Hepatobiliary: Innumerable masses throughout the liver compatible with known metastatic disease. Liver is enlarged measuring nearly 25 cm in length and has increased in size from prior suggesting progression of underlying metastases. Gallbladder is decompressed. Pancreas: Unremarkable. No pancreatic ductal dilatation or surrounding inflammatory changes. Spleen: Normal in size  without focal abnormality. Adrenals/Urinary Tract: Unremarkable adrenal glands. Left-sided nephroureteral stent remains  in place. Unchanged appearance of the kidneys. No renal stone or hydronephrosis. Urinary bladder is incompletely distended. Stomach/Bowel: Stomach is within normal limits. No evidence of bowel wall thickening, distention, or inflammatory changes. Vascular/Lymphatic: Scattered aortoiliac atherosclerotic calcifications without aneurysm. No abdominopelvic lymphadenopathy is identified. Reproductive: Abnormal soft tissue in the left adnexal region abutting the distal left ureter appears increased in size on today's exam measuring approximately 4.6 x 2.7 cm (series 11, image 82), previously measured 3.7 x 2.4 cm on 03/18/2022. Adjacent soft tissue nodule measures up to 1.1 cm (series 11, image 78), previously 0.9 cm. Prior hysterectomy. No right adnexal abnormality. Other: Small volume ascites within the pelvis, increased. Small peritoneal nodule in the left upper quadrant measures 0.9 cm (series 11, image 52), previously 0.7 cm. Small ventral abdominal wall hernia containing only fat. No pneumoperitoneum. Musculoskeletal: No acute or significant osseous findings. Review of the MIP images confirms the above findings. IMPRESSION: CTA chest: 1. No evidence of pulmonary embolism. 2. Innumerable bilateral pulmonary nodules which appear increasing in size and number compared to the previous CT, consistent with progressive pulmonary metastatic disease. 3. Increasing soft tissue density in the bilateral hilar regions could be reactive or represent nodal metastatic disease. CT abdomen pelvis: 1. Enlarging liver containing innumerable masses compatible with progressive hepatic metastatic disease. 2. Interval increase in size of left adnexal soft tissue mass abutting the distal left ureter, consistent known malignancy. 3. Slight interval increase in size of a small peritoneal nodule in the left upper quadrant,  most compatible with metastatic disease. 4. Small volume ascites within the pelvis, increased. Aortic Atherosclerosis (ICD10-I70.0). Electronically Signed   By: Davina Poke D.O.   On: 04/30/2022 19:26   CT ABDOMEN PELVIS W CONTRAST  Result Date: 04/29/2022 CLINICAL DATA:  Pulmonary embolism (PE) suspected, high prob; Nausea/vomiting Sepsis Abdominal pain, acute, nonlocalized. History of metastatic fallopian tube carcinoma EXAM: CT ANGIOGRAPHY CHEST CT ABDOMEN AND PELVIS WITH CONTRAST TECHNIQUE: Multidetector CT imaging of the chest was performed using the standard protocol during bolus administration of intravenous contrast. Multiplanar CT image reconstructions and MIPs were obtained to evaluate the vascular anatomy. Multidetector CT imaging of the abdomen and pelvis was performed using the standard protocol during bolus administration of intravenous contrast. RADIATION DOSE REDUCTION: This exam was performed according to the departmental dose-optimization program which includes automated exposure control, adjustment of the mA and/or kV according to patient size and/or use of iterative reconstruction technique. CONTRAST:  175m OMNIPAQUE IOHEXOL 350 MG/ML SOLN COMPARISON:  CT 03/18/2022, 04/09/2022 FINDINGS: CTA CHEST FINDINGS Cardiovascular: Right chest wall port in place. Satisfactory opacification of the pulmonary arteries to the segmental level. Evaluation is slightly degraded by respiratory motion artifact. No evidence of pulmonary embolism. Thoracic aorta is nonaneurysmal. Mild atherosclerotic calcification of the aorta and coronary arteries. Normal heart size. No pericardial effusion. Elevation of the right hemidiaphragm with mass effect upon the heart and mediastinum. Mediastinum/Nodes: No pathologically enlarged axillary or mediastinal lymph nodes. Increasing soft tissue density in the bilateral hilar regions could be reactive or represent nodal metastatic disease. Trachea and esophagus within  normal limits. Lungs/Pleura: Innumerable bilateral pulmonary nodules which appear increasing in size and number compared to the previous CT. Atelectasis within the right lung base and lingula. No pleural effusion or pneumothorax. Musculoskeletal: No chest wall abnormality. No acute or significant osseous findings. Review of the MIP images confirms the above findings. CT ABDOMEN and PELVIS FINDINGS Hepatobiliary: Innumerable masses throughout the liver compatible with known metastatic disease. Liver is enlarged measuring nearly  25 cm in length and has increased in size from prior suggesting progression of underlying metastases. Gallbladder is decompressed. Pancreas: Unremarkable. No pancreatic ductal dilatation or surrounding inflammatory changes. Spleen: Normal in size without focal abnormality. Adrenals/Urinary Tract: Unremarkable adrenal glands. Left-sided nephroureteral stent remains in place. Unchanged appearance of the kidneys. No renal stone or hydronephrosis. Urinary bladder is incompletely distended. Stomach/Bowel: Stomach is within normal limits. No evidence of bowel wall thickening, distention, or inflammatory changes. Vascular/Lymphatic: Scattered aortoiliac atherosclerotic calcifications without aneurysm. No abdominopelvic lymphadenopathy is identified. Reproductive: Abnormal soft tissue in the left adnexal region abutting the distal left ureter appears increased in size on today's exam measuring approximately 4.6 x 2.7 cm (series 11, image 82), previously measured 3.7 x 2.4 cm on 03/18/2022. Adjacent soft tissue nodule measures up to 1.1 cm (series 11, image 78), previously 0.9 cm. Prior hysterectomy. No right adnexal abnormality. Other: Small volume ascites within the pelvis, increased. Small peritoneal nodule in the left upper quadrant measures 0.9 cm (series 11, image 52), previously 0.7 cm. Small ventral abdominal wall hernia containing only fat. No pneumoperitoneum. Musculoskeletal: No acute or  significant osseous findings. Review of the MIP images confirms the above findings. IMPRESSION: CTA chest: 1. No evidence of pulmonary embolism. 2. Innumerable bilateral pulmonary nodules which appear increasing in size and number compared to the previous CT, consistent with progressive pulmonary metastatic disease. 3. Increasing soft tissue density in the bilateral hilar regions could be reactive or represent nodal metastatic disease. CT abdomen pelvis: 1. Enlarging liver containing innumerable masses compatible with progressive hepatic metastatic disease. 2. Interval increase in size of left adnexal soft tissue mass abutting the distal left ureter, consistent known malignancy. 3. Slight interval increase in size of a small peritoneal nodule in the left upper quadrant, most compatible with metastatic disease. 4. Small volume ascites within the pelvis, increased. Aortic Atherosclerosis (ICD10-I70.0). Electronically Signed   By: Davina Poke D.O.   On: 04/26/2022 19:26   DG Chest 2 View  Result Date: 05/16/2022 CLINICAL DATA:  Shortness of breath and weakness. Vomiting. History of fallopian tube carcinoma as patient has not had radiation/chemotherapy in 6 weeks. EXAM: CHEST - 2 VIEW COMPARISON:  Chest x-ray 01/13/2017 and chest CT 04/09/2022 FINDINGS: Right-sided Port-A-Cath has tip over the SVC. Lungs are hypoinflated with mild elevation the right hemidiaphragm. There is linear atelectasis over the right midlung and left base. No evidence of effusion. Evidence of patient's known multiple pulmonary nodules left worse than right compatible with known pulmonary metastatic disease. Cardiomediastinal silhouette and remainder of the exam is unchanged. IMPRESSION: 1. Hypoinflation with mild linear atelectasis over the right midlung and left base. 2. Known pulmonary metastatic disease. Electronically Signed   By: Marin Olp M.D.   On: 05/12/2022 16:40    Medications: I have reviewed the patient's current  medications.  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 with metastatic disease to the omentum, right fallopian tube, and bilateral ovaries,pT3,pNx, optimal debulking with small peritoneal studding of the diaphragm and a remaining thin rind of tumor at the posterior peritoneum in the pelvis Cycle 1 adjuvant Taxol/carboplatin 08/05/2017 Cycle 2 adjuvant Taxol/carboplatin 08/26/2017 Cycle 3 adjuvant Taxol/carboplatin  09/17/2017-Taxol dose reduced secondary to neuropathy and bone pain  Cycle 4 adjuvant Taxol/carboplatin 10/10/2017 Cycle 5 adjuvant Taxol/carboplatin 10/31/2017 Cycle 6 adjuvant Taxol/carboplatin 11/21/2017 CT abdomen/pelvis 08/08/2019- narrowing of rectosigmoid colon with eccentric serosal implant, 10 mm lymph node at the superior rectal vein, left upper quadrant soft tissue implant, 9 mm precaval node, 15 mm left internal iliac node, no ascites Cycle 1 Taxol/carboplatin 08/31/2019 Cycle 2 Taxol/carboplatin/Avastin 09/21/2019 Cycle 3 Taxol/Carboplatin 10/12/2019 (Avastin discontinued) Cycle 4 Taxol/carboplatin November 02, 2019 CT abdomen/pelvis 11/17/2019-decrease in size of previously prominent pericaval, left internal iliac, and superior rectal vein nodes, resolved rectosigmoid wall thickening Cycle 5 Taxol/carboplatin 11/22/2019 Cycle 6 Taxol/carboplatin 12/13/2019 Olaparib 01/05/2020 CT abdomen/pelvis 07/10/2020-enlarging soft tissue at the distal left ureter, the ureter is not dilated, peritoneal  implant adjacent to the splenic flexure, enlarged left superior rectal node, lymph nodes along the course of the distal IMV have enlarged, eccentric thickening of the sigmoid colon not seen  Olaraparib discontinued 07/12/2020 Cycle 1 Taxol/carboplatin 07/21/2020 Cycle 2 Taxol/carboplatin 08/10/2020 (carboplatin dose reduced secondary to thrombocytopenia) Cycle 3 Taxol/carboplatin 09/05/2020 Cycle 4 Taxol/carboplatin 09/26/2020 Cycle 5 Taxol/carboplatin 10/24/2020 CT abdomen/pelvis 11/13/2020-overall stable disease, no new site of metastatic disease, generally stable small abdominal/pelvic lymph nodes, slight enlargement of a peritoneal implant in the left pelvis, slight decrease in size of another pelvic implant Cycle 6 Taxol/carboplatin 11/14/2020 Cycle 7 Taxol/carboplatin 12/05/2020 Cycle 8 Taxol/carboplatin 12/26/2020 Cycle 9 Taxol/carboplatin 01/16/2021 Cycle 10 Taxol/carboplatin 02/06/2021 Cycle 11 Taxol/carboplatin 02/27/2021 Cycle 12 Taxol/carboplatin 03/20/2021 Cycle 13 Taxol/carboplatin 04/10/2021 Cycle 14 Taxol/carboplatin 05/08/2021 Cycle 15 Taxol/carboplatin 05/29/2021 Cycle 16 Taxol/carboplatin 06/19/2021 CT abdomen/pelvis 07/06/2021-increased size of a left pelvic soft tissue mass with obstruction of the distal left ureter and new moderate left hydronephrosis, no change in mild pelvic lymphadenopathy Systemic therapy placed on hold CT abdomen/pelvis 10/15/2021-new bilateral pulmonary metastases, new hepatic metastases, mild increase in size of left adnexal mass, mild increase in size of sigmoid mesenteric and left upper quadrant omental nodules CT head 10/15/2021-large lucency in the left parietal skull concerning for a bone metastasis Cycle 1 Doxil 10/23/2021 Cycle 2 Doxil/bevacizumab 11/16/2021 Cycle 3 Doxil/bevacizumab 12/20/2021 Cycle 4 Doxil/bevacizumab 01/15/2022 CT abdomen/pelvis 02/11/2022-increased size and number of liver lesions, new right lung nodule, other lung nodules stable, decrease in  left adnexal soft tissue mass and peritoneal nodules, progressive left hydronephrosis Guardant360 02/12/2022-BRCA1-single copy deletion, T p53, EGFR amplification, tumor mutation burden 17, MSI high-not detected Cycle 1 gemcitabine, day 1, day 8-4/25/2023 CT chest abdomen and pelvis, no IV contrast, 03/18/2022-new and enlarging bilateral pulmonary nodules, multiple hypodense liver metastases, increase in size of left adnexal mass with adjacent soft tissue nodule Cycle 2 gemcitabine 04/02/2022 CT angiogram chest 04/09/2022-negative for pulmonary embolism, pulmonary hepatic metastases, bibasilar subsegmental atelectasis Cycle 1 pembrolizumab 04/12/2022    Depression   3.   Hypothyroid   4.   Family history of uterine cancer-paternal grandmother   29.   Acute onset dyspnea/pleuritic left-sided chest pain 10/02/2019 CT chest 10/04/2019-left lower lobe pulmonary emboli, multifocal pneumonia lower lobes On Xarelto, status post course of Levaquin.  6.  Acute onset left lower back pain 01/24/2021-resolved after a Medrol Dosepak  7.  Mild elevation of the creatinine, renal ultrasound 05/31/2021-mild left hydronephrosis CT 07/06/2021-new moderate left hydronephrosis, left ureter stent placed 07/18/2021 Left ureter metallic stent 8/41/6606 8.  Neutropenia following cycle 1 Doxil-Udenyca added with cycle 2 9.  Admission 05/09/2022 with dyspnea and nausea secondary to disease progression CT 05/05/2022 confirmed disease progression in the lungs and liver Marked elevation of liver enzymes secondary  to tumor involving the liver  Anna [name] has metastatic fallopian tube carcinoma.  She completed 1 treatment with pembrolizumab 04/12/2022.  She is now admitted with failure to thrive and multiorgan failure.  The admission CTs are consistent with disease progression.  Anna [name] has a poor prognosis.  She is not a candidate for further systemic therapy.  I recommend hospice care.  She is in agreement with home  hospice care.  We will make a referral to [facility]   Her son was at the bedside when I saw her this morning.  I will be glad to discuss the prognosis with her husband when he is available.  Accommodations: Referral for home hospice care Agree with discontinuing apixaban given poor prognosis I will plan to serve as the primary provider with the home hospice team    LOS: 2 days   [name], MD   05/06/2022, 12:01 PM

## 2022-05-06 NOTE — Progress Notes (Addendum)
CRITICAL VALUE STICKER  CRITICAL VALUE: Glucose 32   RECEIVER (on-site recipient of call): Doreen Beam RN   DATE & TIME NOTIFIED: 05/06/2022 1201   MESSENGER (representative from lab): Leonia Reader  MD NOTIFIED: Dr. Earlie Counts   TIME OF NOTIFICATION: 1205 PM   RESPONSE:  New orders placed.   CRITICAL VALUE STICKER  CRITICAL VALUE: Capillary Blood Glucose 21  MD NOTIFIED: Dr. Earlie Counts   TIME OF NOTIFICATION: 1215  RESPONSE: Dextrose 50 1 amp

## 2022-05-06 NOTE — TOC Initial Note (Signed)
Transition of Care [facility]) - Initial/Assessment Note    Patient Details  Name: Anna [name] MRN: [id number] Date of Birth: August 14, 1956  [facility] CM/SW Contact:    Leeroy Cha, RN Phone Number: 05/06/2022, 8:13 AM  Clinical Narrative:                 Guarded condition.  Palliative care is seeing.  Planning for family meeting possible today.  Notes state that she has less than 6 months to survive.  Will follow for hospice home care or placement.  Expected Discharge Plan: Home w Hospice Care Barriers to Discharge: Continued Medical Work up   Patient Goals and CMS Choice Patient states their goals for this hospitalization and ongoing recovery are:: i would like to go home CMS Medicare.gov Compare Post Acute Care list provided to:: Patient Choice offered to / list presented to : Patient  Expected Discharge Plan and Services Expected Discharge Plan: [place]   Discharge Planning Services: CM Consult   Living arrangements for the past 2 months: Single Family Home                                      Prior Living Arrangements/Services Living arrangements for the past 2 months: Single Family Home Lives with:: Spouse Patient language and need for interpreter reviewed:: Yes Do you feel safe going back to the place where you live?: Yes      Need for Family Participation in Patient Care: Yes (Comment) (family)     Criminal Activity/Legal Involvement Pertinent to Current Situation/Hospitalization: No - Comment as needed  Activities of Daily Living Home Assistive Devices/Equipment: Shower chair without back ADL Screening (condition at time of admission) Patient's cognitive ability adequate to safely complete daily activities?: Yes Is the patient deaf or have difficulty hearing?: No Does the patient have difficulty seeing, even when wearing glasses/contacts?: Yes Does the patient have difficulty concentrating, remembering, or making  decisions?: No Patient able to express need for assistance with ADLs?: Yes Does the patient have difficulty dressing or bathing?: No Independently performs ADLs?: Yes (appropriate for developmental age) Does the patient have difficulty walking or climbing stairs?: Yes Weakness of Legs: Left Weakness of Arms/Hands: None  Permission Sought/Granted                  Emotional Assessment       Orientation: : Oriented to Self, Oriented to Place, Oriented to  Time, Oriented to Situation Alcohol / Substance Use: Not Applicable Psych Involvement: No (comment)  Admission diagnosis:  Shortness of breath [R06.02] Lactic acidosis [E87.20] Pain of upper abdomen [R10.10] Port-A-Cath in place [Z95.828] Malignant neoplasm of ovary, unspecified laterality (HCC) [C56.9] Nausea and vomiting, unspecified vomiting type [R11.2] Patient Active Problem List   Diagnosis Date Noted   Lactic acidosis 19/50/9326   Complicated UTI (urinary tract infection) 04/23/2022   History of pulmonary embolism 04/30/2022   Acute renal failure superimposed on stage 3a chronic kidney disease ([place]) 05/03/2022   Elevated troponin level not due myocardial infarction 04/22/2022   Acute liver failure without hepatic coma 04/26/2022   Hydronephrosis 02/25/2022   Port-A-Cath in place 12/13/2019   Goals of care, counseling/discussion 08/20/2019   History of colon polyps 03/30/2019   Sinusitis 03/21/2019   Enlarged lymph node 03/21/2019   Tick bite of head 03/21/2019   Fatigue 06/25/2018   Genetic testing 11/07/2017   Family  history of breast cancer    Malignant neoplasm of fallopian tube ([place]) 07/28/2017   Ovarian cancer ([place]) 07/17/2017   Carcinomatosis ([place]) 07/09/2017   Palpitations 05/15/2016   Elbow tendonitis 05/15/2016   Arm skin lesion, left 12/11/2015   Health care maintenance 05/08/2015   Toenail fungus 08/07/2014   Family history of colonic polyps 06/20/2014   Neck pain 01/04/2014   Obstructive  sleep apnea 01/04/2014   Rash 09/28/2013   Arthritis 09/08/2013   Mild depression 09/08/2013   Headache(784.0) 09/08/2013   Retinitis 09/08/2013   GERD without esophagitis 09/08/2013   Abnormal Pap smear of cervix 09/08/2013   Essential hypertension 09/07/2013   Hypercholesterolemia 09/07/2013   Hypothyroidism 09/07/2013   Shortness of breath 09/07/2013   PCP:  Einar Pheasant, MD Pharmacy:   [facility], [place] [address] Phone: [phone] Fax: [phone]     Social Determinants of Health (SDOH) Interventions    Readmission Risk Interventions     No data to display

## 2022-05-06 NOTE — Progress Notes (Addendum)
Nutrition Brief Note  Patient screened for MST. Chart reviewed. Patient now transitioning to comfort care. Will liberalize diet from Heart Healthy to Regular. No further nutrition interventions planned at this time. Please re-consult as needed.      Jarome Matin, MS, RD, LDN, [place] Registered Dietitian II Inpatient Clinical Nutrition RD pager # and on-call/weekend pager # available in [facility]

## 2022-05-07 ENCOUNTER — Inpatient Hospital Stay: Payer: Medicare Other

## 2022-05-07 ENCOUNTER — Inpatient Hospital Stay: Payer: Medicare Other | Admitting: Oncology

## 2022-05-07 DIAGNOSIS — N390 Urinary tract infection, site not specified: Secondary | ICD-10-CM | POA: Diagnosis not present

## 2022-05-07 DIAGNOSIS — I10 Essential (primary) hypertension: Secondary | ICD-10-CM | POA: Diagnosis not present

## 2022-05-07 DIAGNOSIS — E039 Hypothyroidism, unspecified: Secondary | ICD-10-CM | POA: Diagnosis not present

## 2022-05-07 MED ORDER — SODIUM CHLORIDE 0.9 % IV SOLN
0.5000 mg/h | INTRAVENOUS | Status: DC
  Filled 2022-05-07: qty 2.5

## 2022-05-07 MED ORDER — HYDROMORPHONE BOLUS VIA INFUSION: 1.0000 mg | INTRAVENOUS | Status: DC | PRN

## 2022-05-09 LAB — CULTURE, BLOOD (ROUTINE X 2)
Culture: NO GROWTH
Culture: NO GROWTH
Special Requests: ADEQUATE

## 2022-05-18 NOTE — Progress Notes (Signed)
Daily Progress Note   Patient Name: Anna [name]       Date: June 05, 2022 DOB: 28-Apr-1956  Age: 66 y.o. MRN#: [id number] Attending Physician: Donne Hazel, MD Primary Care Physician: Einar Pheasant, MD Admit Date: 04/24/2022  Reason for Consultation/Follow-up: Establishing goals of care and Terminal Care  Subjective: Worsening status today. Episodes of severe hypoglycemia, hypotension  Length of Stay: 3  Current Medications: Scheduled Meds:   Chlorhexidine Gluconate Cloth  6 each Topical Daily   sodium chloride flush  10-40 mL Intracatheter Q12H    Continuous Infusions:  HYDROmorphone      PRN Meds: acetaminophen **OR** acetaminophen, antiseptic oral rinse, glycopyrrolate **OR** glycopyrrolate **OR** glycopyrrolate, HYDROmorphone, HYDROmorphone (DILAUDID) injection, LORazepam **OR** LORazepam **OR** LORazepam, mouth rinse, polyvinyl alcohol, prochlorperazine, sodium chloride flush  Physical Exam          Vital Signs: BP (!) 91/43   Pulse 63   Temp (!) 96.3 F (35.7 C) (Axillary) Comment: RN notified  Resp 13   Ht '5\' 3"'$  (1.6 m)   Wt 75.3 kg   SpO2 94%   BMI 29.41 kg/m  SpO2: SpO2: 94 % O2 Device: O2 Device: Room Air O2 Flow Rate: O2 Flow Rate (L/min): 2 L/min  Intake/output summary:  Intake/Output Summary (Last 24 hours) at 2022-06-05 1212 Last data filed at 05/06/2022 1519 Gross per 24 hour  Intake 262.83 ml  Output --  Net 262.83 ml    LBM: Last BM Date : 05/05/22 Baseline Weight: Weight: 72 kg Most recent weight: Weight: 75.3 kg  Unresponsive. Jaundice. Upper airway congestion. Dry oral mucosa. Shallow breathing with intermittent periods of apnea.      Palliative Assessment/Data:10%    Flowsheet Rows    Flowsheet Row Most Recent Value  Intake  Tab   Referral Department Hospitalist  Unit at Time of Referral ICU  Palliative Care Primary Diagnosis Cancer  Date Notified 05/05/22  Palliative Care Type New Palliative care  Reason for referral Clarify Goals of Care  Date of Admission 04/23/2022  Date first seen by Palliative Care 05/05/22  # of days Palliative referral response time 0 Day(s)  # of days IP prior to Palliative referral 1  Clinical Assessment   Palliative Performance Scale Score 40%  Psychosocial & Spiritual Assessment   Palliative Care Outcomes        Patient Active Problem List   Diagnosis Date Noted   Lactic acidosis 16/94/5038   Complicated UTI (urinary tract infection) 05/06/2022   History of pulmonary embolism 05/10/2022   Acute renal failure superimposed on stage 3a chronic kidney disease ([place]) 05/11/2022   Elevated troponin level not due myocardial infarction 05/01/2022   Acute liver failure without hepatic coma 05/08/2022   Hydronephrosis 02/25/2022   Port-A-Cath in place 12/13/2019   Goals of care, counseling/discussion 08/20/2019   History of colon polyps 03/30/2019   Sinusitis 03/21/2019   Enlarged lymph node 03/21/2019   Tick bite of head 03/21/2019   Fatigue 06/25/2018   Genetic testing 11/07/2017   Family history of breast cancer    Malignant neoplasm of fallopian tube ([place]) 07/28/2017   Ovarian cancer ([place]) 07/17/2017   Carcinomatosis ([place]) 07/09/2017   Palpitations 05/15/2016   Elbow tendonitis  05/15/2016   Arm skin lesion, left 12/11/2015   Health care maintenance 05/08/2015   Toenail fungus 08/07/2014   Family history of colonic polyps 06/20/2014   Neck pain 01/04/2014   Obstructive sleep apnea 01/04/2014   Rash 09/28/2013   Arthritis 09/08/2013   Mild depression 09/08/2013   Headache(784.0) 09/08/2013   Retinitis 09/08/2013   GERD without esophagitis 09/08/2013   Abnormal Pap smear of cervix 09/08/2013   Essential hypertension 09/07/2013   Hypercholesterolemia 09/07/2013    Hypothyroidism 09/07/2013   Shortness of breath 09/07/2013    Palliative Care Assessment & Plan   Patient Profile: Metastatic ovarian cancer. Widespread metastatic disease. Multiple courses of chemotherapy.Approaching end of life. Family gathered at bedside.   Assessment: Nearing EOL. Unresponsive but moaning with expiration, mouth is very dry. Secretions collecting back of her throat. No urine output. Hours likely, <24 hours anticipated. PPS now 10%  Recommendations/Plan: Comfort Measures only. Stopped D10. She is not stable enough to be moved out of the hospital or ICU. Will initiate a continuous hydromorphone infusion for her comfort. She is very near EOL. Oral care Goals of Care and Additional Recommendations: Limitations on Scope of Treatment: Full Comfort Care  Code Status:    Code Status Orders  (From admission, onward)           Start     Ordered   05/05/22 0408  Do not attempt resuscitation (DNR)  Continuous       Question Answer Comment  In the event of cardiac or respiratory ARREST Do not call a "code blue"   In the event of cardiac or respiratory ARREST Do not perform Intubation, CPR, defibrillation or ACLS   In the event of cardiac or respiratory ARREST Use medication by any route, position, wound care, and other measures to relive pain and suffering. May use oxygen, suction and manual treatment of airway obstruction as needed for comfort.      05/05/22 0407           Code Status History     Date Active Date Inactive Code Status Order ID Comments User Context   04/28/2022 2244 05/05/2022 0407 Full Code [id number]  Vernelle Emerald, MD ED   07/17/2017 1912 07/20/2017 2049 Full Code [id number]  Lahoma Crocker, MD Inpatient      Advance Directive Documentation    Flowsheet Row Most Recent Value  Type of Advance Directive Out of facility DNR (pink MOST or yellow form)  Pre-existing out of facility DNR order (yellow form or pink MOST form) Physician  notified to receive inpatient order  "MOST" Form in Place? --       Prognosis:  Hours - Days  Discharge Planning: Anticipated Hospital Death  Care plan was discussed with Patient, Family and RN.  Thank you for allowing the Palliative Medicine Team to assist in the care of this patient.  Time: 50 minutes     Greater than 50%  of this time was spent counseling and coordinating care related to the above assessment and plan.  Lane Hacker, DO  Please contact Palliative Medicine Team phone at [phone] for questions and concerns.

## 2022-05-18 NOTE — Progress Notes (Signed)
Patient noted to be asystole on heart monitor. Patient noted to have no respirations, heart sounds, or palpable pulses upon assessment. No heart sounds auscultated by writer and  Josph Macho, RN. Per order RN to pronounce time of death 1:54 pm. Family present at bedside at time of death.  Dr. Wyline Copas was notified . No belonging in room.

## 2022-05-18 NOTE — Progress Notes (Signed)
Chaplain received a consult that Christal was Comfort Care.  Chaplain met with Gavina's brother, who is a Theme park manager, as well as her son and daughter.  They feel well supported with Braeden's brother's presence.  Chaplain let them know of our continued availability and encouraged Kayline's brother to reach out for support so that he could just be Alpa's brother.  If needs arise or if family requests, please page [place] at [phone].

## 2022-05-18 NOTE — Discharge Summary (Signed)
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  Patient was been brought in by EMS from home due to complaints of increasing weakness shortness breath and vomiting.   CT imaging of the chest abdomen and pelvis once again redemonstrated innumerable bilateral areas of metastatic disease in addition to exhibiting an enlarging liver due to innumerable masses due to progressive hepatic metastatic disease  Final Diagnoses:  * Complicated UTI (urinary tract infection) Patient presenting with evidence of leukocytosis, worsening abdominal pain and substantial lactic acidosis Urinalysis appeared to be suggestive of urinary tract infection Pt was continued on empiric Zosyn Later transitioned to comfort measures   Lactic acidosis Lactic acidosis might be secondary to volume depletion and underlying infection but it is also quite possible that the patient is suffering from severe lactic acidosis due to rapidly progressive diffuse liver injury due to profound metastatic disease Patient was given empiric intravenous antibiotics Received IVF hydration Later transitioned to comfort measures   Acute liver failure without hepatic coma Patient is suffering from  diffuse hepatocellular injury due  to rapidly progressive metastatic disease Appreciate input by GI and Oncology. Liver is largely replaced by cancer with decompensation. Later became profoundly hypoglycemic, requiring D10 IVF Very poor prognosis with later transition to comfort measures   Acute renal failure superimposed on stage 3a chronic kidney disease ([place]) Patient exhibiting evidence of acute kidney injury secondary to volume depletion and urinary tract infection No evidence of new hydronephrosis on CT imaging of the abdomen, left ureteral stent remains in place Later focus on comfort measures   Elevated troponin level not due myocardial infarction Slightly elevated serial troponins with flat trajectory of elevation Pt had remained chest pain free   Malignant neoplasm of fallopian tube (HCC) Rapidly progressive metastatic disease with both pulmonary and hepatic manifestations Considering derangement of liver function patient's prognosis is exceedingly poor Followed by Dr. Benay Spice.  Later transition to full comfort   History of pulmonary embolism Held home regimen of Xarelto due to markedly elevated INR in the setting of diffuse liver injury with concern for risk of spontaneous bleeding Later focus on comfort measures   Essential hypertension Held home anti-hypertensives as patient was currently exhibiting low normal blood pressures Later focus on comfort care   Hypothyroidism Initially continued home regimen of Synthroid Later focus on comfort measures   GERD without esophagitis Pt had been receiving home regimen of daily PPI therapy. Later transition to comfort measures   End of Life Care -Wishes were established to be DNR -With progressive decline and grim prognosis, pt's care was changed to comfort measures -Patient was later later pronounced at 1354 on May 23, 2022    DNR   Hypoalbuminemia   Hyerkalemia    The results of significant diagnostics from this hospitalization (including imaging,  microbiology, ancillary and laboratory) are listed below for reference.    Significant Diagnostic Studies: CT Angio Chest PE  W/Cm &/Or Wo Cm  Result Date: 05/11/2022 CLINICAL DATA:  Pulmonary embolism (PE) suspected, high prob; Nausea/vomiting Sepsis Abdominal pain, acute, nonlocalized. History of metastatic fallopian tube carcinoma EXAM: CT ANGIOGRAPHY CHEST CT ABDOMEN AND PELVIS WITH CONTRAST TECHNIQUE: Multidetector CT imaging of the chest was performed using the standard protocol during bolus administration of intravenous contrast. Multiplanar CT image reconstructions and MIPs were obtained to evaluate the vascular anatomy. Multidetector CT imaging of the abdomen and pelvis was performed using the standard protocol during bolus administration of intravenous contrast. RADIATION DOSE REDUCTION: This exam was performed according to the departmental dose-optimization program which includes automated exposure control, adjustment of the mA and/or kV according to patient size and/or use of iterative reconstruction technique. CONTRAST:  168m OMNIPAQUE IOHEXOL 350 MG/ML SOLN COMPARISON:  CT 03/18/2022, 04/09/2022 FINDINGS: CTA CHEST FINDINGS Cardiovascular: Right chest wall port in place. Satisfactory opacification of the pulmonary arteries to the segmental level. Evaluation is slightly degraded by respiratory motion artifact. No evidence of pulmonary embolism. Thoracic aorta is nonaneurysmal. Mild atherosclerotic calcification of the aorta and coronary arteries. Normal heart size. No pericardial effusion. Elevation of the right hemidiaphragm with mass effect upon the heart and mediastinum. Mediastinum/Nodes: No pathologically enlarged axillary or mediastinal lymph nodes. Increasing soft tissue density in the bilateral hilar regions could be reactive or represent nodal metastatic disease. Trachea and esophagus within normal limits. Lungs/Pleura: Innumerable bilateral pulmonary nodules which appear increasing in  size and number compared to the previous CT. Atelectasis within the right lung base and lingula. No pleural effusion or pneumothorax. Musculoskeletal: No chest wall abnormality. No acute or significant osseous findings. Review of the MIP images confirms the above findings. CT ABDOMEN and PELVIS FINDINGS Hepatobiliary: Innumerable masses throughout the liver compatible with known metastatic disease. Liver is enlarged measuring nearly 25 cm in length and has increased in size from prior suggesting progression of underlying metastases. Gallbladder is decompressed. Pancreas: Unremarkable. No pancreatic ductal dilatation or surrounding inflammatory changes. Spleen: Normal in size without focal abnormality. Adrenals/Urinary Tract: Unremarkable adrenal glands. Left-sided nephroureteral stent remains in place. Unchanged appearance of the kidneys. No renal stone or hydronephrosis. Urinary bladder is incompletely distended. Stomach/Bowel: Stomach is within normal limits. No evidence of bowel wall thickening, distention, or inflammatory changes. Vascular/Lymphatic: Scattered aortoiliac atherosclerotic calcifications without aneurysm. No abdominopelvic lymphadenopathy is identified. Reproductive: Abnormal soft tissue in the left adnexal region abutting the distal left ureter appears increased in size on today's exam measuring approximately 4.6 x 2.7 cm (series 11, image 82), previously measured 3.7 x 2.4 cm on 03/18/2022. Adjacent soft tissue nodule measures up to 1.1 cm (series 11, image 78), previously 0.9 cm. Prior hysterectomy. No right adnexal abnormality. Other: Small volume ascites within the pelvis, increased. Small peritoneal nodule in the left upper quadrant measures 0.9 cm (series 11, image 52), previously 0.7 cm. Small ventral abdominal wall hernia containing only fat. No pneumoperitoneum. Musculoskeletal: No acute or significant osseous findings. Review of the MIP images confirms the above findings. IMPRESSION: CTA  chest: 1. No evidence of pulmonary embolism. 2. Innumerable bilateral pulmonary nodules which appear increasing in size and number compared to the previous CT, consistent with progressive pulmonary metastatic disease. 3. Increasing soft tissue density in the bilateral hilar regions could be reactive or represent nodal metastatic disease. CT abdomen pelvis: 1. Enlarging liver containing innumerable masses compatible with progressive hepatic metastatic disease. 2. Interval increase in size of left adnexal soft tissue mass abutting the distal left ureter, consistent known malignancy. 3. Slight interval increase  in size of a small peritoneal nodule in the left upper quadrant, most compatible with metastatic disease. 4. Small volume ascites within the pelvis, increased. Aortic Atherosclerosis (ICD10-I70.0). Electronically Signed   By: Davina Poke D.O.   On: 04/29/2022 19:26   CT ABDOMEN PELVIS W CONTRAST  Result Date: 05/13/2022 CLINICAL DATA:  Pulmonary embolism (PE) suspected, high prob; Nausea/vomiting Sepsis Abdominal pain, acute, nonlocalized. History of metastatic fallopian tube carcinoma EXAM: CT ANGIOGRAPHY CHEST CT ABDOMEN AND PELVIS WITH CONTRAST TECHNIQUE: Multidetector CT imaging of the chest was performed using the standard protocol during bolus administration of intravenous contrast. Multiplanar CT image reconstructions and MIPs were obtained to evaluate the vascular anatomy. Multidetector CT imaging of the abdomen and pelvis was performed using the standard protocol during bolus administration of intravenous contrast. RADIATION DOSE REDUCTION: This exam was performed according to the departmental dose-optimization program which includes automated exposure control, adjustment of the mA and/or kV according to patient size and/or use of iterative reconstruction technique. CONTRAST:  118m OMNIPAQUE IOHEXOL 350 MG/ML SOLN COMPARISON:  CT 03/18/2022, 04/09/2022 FINDINGS: CTA CHEST FINDINGS  Cardiovascular: Right chest wall port in place. Satisfactory opacification of the pulmonary arteries to the segmental level. Evaluation is slightly degraded by respiratory motion artifact. No evidence of pulmonary embolism. Thoracic aorta is nonaneurysmal. Mild atherosclerotic calcification of the aorta and coronary arteries. Normal heart size. No pericardial effusion. Elevation of the right hemidiaphragm with mass effect upon the heart and mediastinum. Mediastinum/Nodes: No pathologically enlarged axillary or mediastinal lymph nodes. Increasing soft tissue density in the bilateral hilar regions could be reactive or represent nodal metastatic disease. Trachea and esophagus within normal limits. Lungs/Pleura: Innumerable bilateral pulmonary nodules which appear increasing in size and number compared to the previous CT. Atelectasis within the right lung base and lingula. No pleural effusion or pneumothorax. Musculoskeletal: No chest wall abnormality. No acute or significant osseous findings. Review of the MIP images confirms the above findings. CT ABDOMEN and PELVIS FINDINGS Hepatobiliary: Innumerable masses throughout the liver compatible with known metastatic disease. Liver is enlarged measuring nearly 25 cm in length and has increased in size from prior suggesting progression of underlying metastases. Gallbladder is decompressed. Pancreas: Unremarkable. No pancreatic ductal dilatation or surrounding inflammatory changes. Spleen: Normal in size without focal abnormality. Adrenals/Urinary Tract: Unremarkable adrenal glands. Left-sided nephroureteral stent remains in place. Unchanged appearance of the kidneys. No renal stone or hydronephrosis. Urinary bladder is incompletely distended. Stomach/Bowel: Stomach is within normal limits. No evidence of bowel wall thickening, distention, or inflammatory changes. Vascular/Lymphatic: Scattered aortoiliac atherosclerotic calcifications without aneurysm. No abdominopelvic  lymphadenopathy is identified. Reproductive: Abnormal soft tissue in the left adnexal region abutting the distal left ureter appears increased in size on today's exam measuring approximately 4.6 x 2.7 cm (series 11, image 82), previously measured 3.7 x 2.4 cm on 03/18/2022. Adjacent soft tissue nodule measures up to 1.1 cm (series 11, image 78), previously 0.9 cm. Prior hysterectomy. No right adnexal abnormality. Other: Small volume ascites within the pelvis, increased. Small peritoneal nodule in the left upper quadrant measures 0.9 cm (series 11, image 52), previously 0.7 cm. Small ventral abdominal wall hernia containing only fat. No pneumoperitoneum. Musculoskeletal: No acute or significant osseous findings. Review of the MIP images confirms the above findings. IMPRESSION: CTA chest: 1. No evidence of pulmonary embolism. 2. Innumerable bilateral pulmonary nodules which appear increasing in size and number compared to the previous CT, consistent with progressive pulmonary metastatic disease. 3. Increasing soft tissue density in the bilateral hilar regions could  be reactive or represent nodal metastatic disease. CT abdomen pelvis: 1. Enlarging liver containing innumerable masses compatible with progressive hepatic metastatic disease. 2. Interval increase in size of left adnexal soft tissue mass abutting the distal left ureter, consistent known malignancy. 3. Slight interval increase in size of a small peritoneal nodule in the left upper quadrant, most compatible with metastatic disease. 4. Small volume ascites within the pelvis, increased. Aortic Atherosclerosis (ICD10-I70.0). Electronically Signed   By: Davina Poke D.O.   On: 05/13/2022 19:26   DG Chest 2 View  Result Date: 05/13/2022 CLINICAL DATA:  Shortness of breath and weakness. Vomiting. History of fallopian tube carcinoma as patient has not had radiation/chemotherapy in 6 weeks. EXAM: CHEST - 2 VIEW COMPARISON:  Chest x-ray 01/13/2017 and chest CT  04/09/2022 FINDINGS: Right-sided Port-A-Cath has tip over the SVC. Lungs are hypoinflated with mild elevation the right hemidiaphragm. There is linear atelectasis over the right midlung and left base. No evidence of effusion. Evidence of patient's known multiple pulmonary nodules left worse than right compatible with known pulmonary metastatic disease. Cardiomediastinal silhouette and remainder of the exam is unchanged. IMPRESSION: 1. Hypoinflation with mild linear atelectasis over the right midlung and left base. 2. Known pulmonary metastatic disease. Electronically Signed   By: Marin Olp M.D.   On: 05/03/2022 16:40   CT Angio Chest Pulmonary Embolism (PE) W or WO Contrast  Result Date: 04/09/2022 CLINICAL DATA:  Dyspnea.  History of fallopian tube cancer. EXAM: CT ANGIOGRAPHY CHEST WITH CONTRAST TECHNIQUE: Multidetector CT imaging of the chest was performed using the standard protocol during bolus administration of intravenous contrast. Multiplanar CT image reconstructions and MIPs were obtained to evaluate the vascular anatomy. RADIATION DOSE REDUCTION: This exam was performed according to the departmental dose-optimization program which includes automated exposure control, adjustment of the mA and/or kV according to patient size and/or use of iterative reconstruction technique. CONTRAST:  41m OMNIPAQUE IOHEXOL 350 MG/ML SOLN COMPARISON:  Mar 18, 2022. FINDINGS: Cardiovascular: Satisfactory opacification of the pulmonary arteries to the segmental level. No evidence of pulmonary embolism. Normal heart size. No pericardial effusion. Mediastinum/Nodes: No enlarged mediastinal, hilar, or axillary lymph nodes. Thyroid gland, trachea, and esophagus demonstrate no significant findings. Lungs/Pleura: No pneumothorax or pleural effusion is noted. Multiple pulmonary nodules are again noted consistent with metastatic disease as described on prior exam. Mild bibasilar subsegmental atelectasis is noted. Upper Abdomen:  Hepatic metastases as noted on prior exam are again visualized. Musculoskeletal: No chest wall abnormality. No acute or significant osseous findings. Review of the MIP images confirms the above findings. IMPRESSION: No definite evidence of pulmonary embolus. Continued presence of pulmonary and hepatic metastases as noted on prior exam. Mild bibasilar subsegmental atelectasis is noted. Electronically Signed   By: JMarijo ConceptionM.D.   On: 04/09/2022 11:21    Microbiology: Recent Results (from the past 240 hour(s))  Culture, blood (routine x 2)     Status: None (Preliminary result)   Collection Time: 05/02/2022  6:21 PM   Specimen: BLOOD  Result Value Ref Range Status   Specimen Description   Final    BLOOD LEFT ANTECUBITAL Performed at [facility], [place] [address]   Special Requests   Final    BOTTLES DRAWN AEROBIC AND ANAEROBIC Blood Culture adequate volume Performed at [facility], [place] [address]   Culture   Final    NO GROWTH 3 DAYS Performed at [facility], [place] [address] [place], [place] [address]    Report Status PENDING  Incomplete  Culture, blood (routine x 2)     Status: None (Preliminary result)   Collection Time: 04/21/2022  6:25 PM   Specimen: BLOOD  Result Value Ref Range Status   Specimen Description   Final    BLOOD BLOOD RIGHT FOREARM Performed at [facility] [address] [place], [place] [address]    Special Requests   Final    BOTTLES DRAWN AEROBIC ONLY Blood Culture results may not be optimal due to an inadequate volume of blood received in culture bottles Performed at [place] [address], [place], [place] [address]    Culture   Final    NO GROWTH 3 DAYS Performed at [facility], [place] [address], [place], [place] [address]    Report Status PENDING  Incomplete  Urine Culture     Status: Abnormal    Collection Time: 05/04/2022  7:46 PM   Specimen: Urine, Clean Catch  Result Value Ref Range Status   Specimen Description   Final    URINE, CLEAN CATCH Performed at [facility], [place] [address], [place], [place] [address]    Special Requests   Final    NONE Performed at [facility], [place] [address] [place], [place] [address]    Culture (A)  Final    >=100,000 COLONIES/mL LACTOBACILLUS SPECIES Standardized susceptibility testing for this organism is not available. Performed at [facility], [place] [address], [place], [place] [address]    Report Status 05/06/2022 FINAL  Final  SARS Coronavirus 2 by RT PCR (hospital order, performed in [facility] hospital lab) *cepheid single result test* Anterior Nasal Swab     Status: None   Collection Time: 05/05/22  3:04 AM   Specimen: Anterior Nasal Swab  Result Value Ref Range Status   SARS Coronavirus 2 by RT PCR NEGATIVE NEGATIVE Final    Comment: (NOTE) SARS-CoV-2 target nucleic acids are NOT DETECTED.  The SARS-CoV-2 RNA is generally detectable in upper and lower respiratory specimens during the acute phase of infection. The lowest concentration of SARS-CoV-2 viral copies this assay can detect is 250 copies / mL. A negative result does not preclude SARS-CoV-2 infection and should not be used as the sole basis for treatment or other patient management decisions.  A negative result may occur with improper specimen collection / handling, submission of specimen other than nasopharyngeal swab, presence of viral mutation(s) within the areas targeted by this assay, and inadequate number of viral copies (<250 copies / mL). A negative result must be combined with clinical observations, patient history, and epidemiological information.  Fact Sheet for Patients:   https://www.patel.info/  Fact Sheet for Healthcare Providers: https://hall.com/  This test is not  yet approved or  cleared by the [place] FDA and has been authorized for detection and/or diagnosis of SARS-CoV-2 by FDA under an Emergency Use Authorization (EUA).  This EUA will remain in effect (meaning this test can be used) for the duration of the COVID-19 declaration under Section 564(b)(1) of the Act, 21 U.S.C. section 360bbb-3(b)(1), unless the authorization is terminated or revoked sooner.  Performed at [facility], [place] [address] [place],  [address]   MRSA Next Gen by PCR, Nasal     Status: None   Collection Time: 05/05/22  3:37 AM   Specimen: Nasal Mucosa; Nasal Swab  Result Value  Ref Range Status   MRSA by PCR Next Gen NOT DETECTED NOT DETECTED Final    Comment: (NOTE) The GeneXpert MRSA Assay (FDA approved for NASAL specimens only), is one component of a comprehensive MRSA colonization surveillance program. It is not intended to diagnose MRSA infection nor to guide or monitor treatment for MRSA infections. Test performance is not FDA approved in patients less than 4 years old. Performed at [facility], [place] [address] [place], [place] [address]      Labs: Basic Metabolic Panel: Recent Labs  Lab 04/25/2022 1620 05/05/22 0848 05/06/22 1034  NA 130* 131* 132*  K 4.6 4.4 5.7*  CL 96* 98 99  CO2 17* 17* 14*  GLUCOSE 55* 62* 32*  BUN 34* 39* 52*  CREATININE 1.64* 1.87* 3.02*  CALCIUM 8.0* 8.0* 7.5*  MG  --  2.5*  --    Liver Function Tests: Recent Labs  Lab 04/25/2022 1620 05/05/22 0848 05/06/22 1034  AST 523* 772* 997*  ALT 146* 206* 258*  ALKPHOS 612* 633* 575*  BILITOT 7.3* 8.0* 8.4*  PROT 6.6 6.6 6.0*  ALBUMIN 1.9* 1.9* 1.7*   No results for input(s): "LIPASE", "AMYLASE" in the last 168 hours. Recent Labs  Lab 05/15/2022 1832  AMMONIA 50*   CBC: Recent Labs  Lab 05/12/2022 1620 05/05/22 0848 05/06/22 1034  WBC 10.9* 11.8* 11.2*  NEUTROABS 8.5* 8.3*  --   HGB 9.8* 9.6* 8.6*  HCT 31.2* 31.6*  28.4*  MCV 105.1* 108.6* 108.4*  PLT 134* 128* 103*   Cardiac Enzymes: No results for input(s): "CKTOTAL", "CKMB", "CKMBINDEX", "TROPONINI" in the last 168 hours. D-Dimer No results for input(s): "DDIMER" in the last 72 hours. BNP: Invalid input(s): "POCBNP" CBG: Recent Labs  Lab 05/05/22 0044 05/05/22 0100 05/05/22 0236 05/06/22 1213 05/06/22 1234  GLUCAP 39* 163* 103* 21* 154*   Anemia work up No results for input(s): "VITAMINB12", "FOLATE", "FERRITIN", "TIBC", "IRON", "RETICCTPCT" in the last 72 hours. Urinalysis    Component Value Date/Time   COLORURINE AMBER (A) 05/14/2022 1946   APPEARANCEUR CLOUDY (A) 04/24/2022 1946   LABSPEC >1.046 (H) 04/27/2022 1946   PHURINE 5.0 05/05/2022 1946   GLUCOSEU NEGATIVE 04/30/2022 1946   HGBUR LARGE (A) 04/28/2022 1946   BILIRUBINUR SMALL (A) 04/21/2022 1946   BILIRUBINUR neg 05/09/2015 1435   KETONESUR NEGATIVE 05/09/2022 1946   PROTEINUR 30 (A) 04/21/2022 1946   UROBILINOGEN negative 05/09/2015 1435   NITRITE NEGATIVE 05/03/2022 1946   LEUKOCYTESUR LARGE (A) 04/21/2022 1946   Sepsis Labs Recent Labs  Lab 05/05/2022 1620 05/05/22 0848 05/06/22 1034  WBC 10.9* 11.8* 11.2*    SIGNED:  Marylu Lund, MD  [facility] 2022-05-11, 2:16 PM  If 7PM-7AM, please contact night-coverage [email or web address] Password [email or web address]

## 2022-05-18 DEATH — deceased

## 2022-05-28 ENCOUNTER — Inpatient Hospital Stay: Payer: Medicare Other

## 2022-05-28 ENCOUNTER — Inpatient Hospital Stay: Payer: Medicare Other | Admitting: Oncology
